# Patient Record
Sex: Male | Born: 1935 | Race: White | Hispanic: No | Smoking: Former smoker
Health system: Southern US, Community
[De-identification: ages and names within clinical notes are randomized; demographics above are authoritative.]

## PROBLEM LIST (undated history)

## (undated) DIAGNOSIS — G4733 Obstructive sleep apnea (adult) (pediatric): Secondary | ICD-10-CM

## (undated) DIAGNOSIS — C439 Malignant melanoma of skin, unspecified: Secondary | ICD-10-CM

## (undated) DIAGNOSIS — Z86711 Personal history of pulmonary embolism: Secondary | ICD-10-CM

## (undated) DIAGNOSIS — G629 Polyneuropathy, unspecified: Secondary | ICD-10-CM

## (undated) DIAGNOSIS — I214 Non-ST elevation (NSTEMI) myocardial infarction: Secondary | ICD-10-CM

## (undated) DIAGNOSIS — K219 Gastro-esophageal reflux disease without esophagitis: Secondary | ICD-10-CM

## (undated) DIAGNOSIS — J449 Chronic obstructive pulmonary disease, unspecified: Secondary | ICD-10-CM

## (undated) DIAGNOSIS — C4492 Squamous cell carcinoma of skin, unspecified: Secondary | ICD-10-CM

## (undated) DIAGNOSIS — H9193 Unspecified hearing loss, bilateral: Secondary | ICD-10-CM

## (undated) DIAGNOSIS — E785 Hyperlipidemia, unspecified: Secondary | ICD-10-CM

## (undated) DIAGNOSIS — I82409 Acute embolism and thrombosis of unspecified deep veins of unspecified lower extremity: Secondary | ICD-10-CM

## (undated) DIAGNOSIS — I10 Essential (primary) hypertension: Secondary | ICD-10-CM

## (undated) HISTORY — PX: PERCUTANEOUS CORONARY STENT INTERVENTION (PCI-S): SHX6016

---

## 2021-10-31 ENCOUNTER — Observation Stay (HOSPITAL_COMMUNITY): Payer: Medicare Other

## 2021-10-31 ENCOUNTER — Encounter (HOSPITAL_COMMUNITY): Payer: Self-pay | Admitting: Internal Medicine

## 2021-10-31 ENCOUNTER — Emergency Department (HOSPITAL_COMMUNITY): Payer: Medicare Other

## 2021-10-31 ENCOUNTER — Other Ambulatory Visit: Payer: Self-pay

## 2021-10-31 ENCOUNTER — Inpatient Hospital Stay (HOSPITAL_COMMUNITY)
Admission: EM | Admit: 2021-10-31 | Discharge: 2021-11-08 | DRG: 643 | Disposition: A | Discharge status: Skilled Nursing Facility | Payer: Medicare Other | Source: Skilled Nursing Facility | Attending: Internal Medicine | Admitting: Internal Medicine

## 2021-10-31 DIAGNOSIS — R140 Abdominal distension (gaseous): Secondary | ICD-10-CM

## 2021-10-31 DIAGNOSIS — R1314 Dysphagia, pharyngoesophageal phase: Secondary | ICD-10-CM | POA: Diagnosis present

## 2021-10-31 DIAGNOSIS — R0602 Shortness of breath: Secondary | ICD-10-CM

## 2021-10-31 DIAGNOSIS — Z79899 Other long term (current) drug therapy: Secondary | ICD-10-CM

## 2021-10-31 DIAGNOSIS — I10 Essential (primary) hypertension: Secondary | ICD-10-CM | POA: Diagnosis present

## 2021-10-31 DIAGNOSIS — S0083XA Contusion of other part of head, initial encounter: Secondary | ICD-10-CM

## 2021-10-31 DIAGNOSIS — E871 Hypo-osmolality and hyponatremia: Secondary | ICD-10-CM

## 2021-10-31 DIAGNOSIS — R410 Disorientation, unspecified: Secondary | ICD-10-CM

## 2021-10-31 DIAGNOSIS — E222 Syndrome of inappropriate secretion of antidiuretic hormone: Secondary | ICD-10-CM | POA: Diagnosis not present

## 2021-10-31 DIAGNOSIS — W19XXXA Unspecified fall, initial encounter: Secondary | ICD-10-CM

## 2021-10-31 DIAGNOSIS — I4891 Unspecified atrial fibrillation: Secondary | ICD-10-CM | POA: Diagnosis present

## 2021-10-31 DIAGNOSIS — I2510 Atherosclerotic heart disease of native coronary artery without angina pectoris: Secondary | ICD-10-CM | POA: Diagnosis present

## 2021-10-31 DIAGNOSIS — Z95828 Presence of other vascular implants and grafts: Secondary | ICD-10-CM

## 2021-10-31 DIAGNOSIS — G9341 Metabolic encephalopathy: Secondary | ICD-10-CM | POA: Diagnosis present

## 2021-10-31 DIAGNOSIS — E785 Hyperlipidemia, unspecified: Secondary | ICD-10-CM | POA: Diagnosis present

## 2021-10-31 DIAGNOSIS — I252 Old myocardial infarction: Secondary | ICD-10-CM

## 2021-10-31 DIAGNOSIS — G4733 Obstructive sleep apnea (adult) (pediatric): Secondary | ICD-10-CM | POA: Diagnosis present

## 2021-10-31 DIAGNOSIS — Z87891 Personal history of nicotine dependence: Secondary | ICD-10-CM

## 2021-10-31 DIAGNOSIS — J9601 Acute respiratory failure with hypoxia: Secondary | ICD-10-CM

## 2021-10-31 DIAGNOSIS — M79602 Pain in left arm: Secondary | ICD-10-CM

## 2021-10-31 DIAGNOSIS — K2100 Gastro-esophageal reflux disease with esophagitis, without bleeding: Secondary | ICD-10-CM | POA: Diagnosis present

## 2021-10-31 DIAGNOSIS — T1490XA Injury, unspecified, initial encounter: Secondary | ICD-10-CM

## 2021-10-31 DIAGNOSIS — Z66 Do not resuscitate: Secondary | ICD-10-CM | POA: Diagnosis present

## 2021-10-31 DIAGNOSIS — Z20822 Contact with and (suspected) exposure to covid-19: Secondary | ICD-10-CM | POA: Diagnosis present

## 2021-10-31 DIAGNOSIS — G9340 Encephalopathy, unspecified: Secondary | ICD-10-CM

## 2021-10-31 DIAGNOSIS — Z85820 Personal history of malignant melanoma of skin: Secondary | ICD-10-CM

## 2021-10-31 DIAGNOSIS — K449 Diaphragmatic hernia without obstruction or gangrene: Secondary | ICD-10-CM | POA: Diagnosis present

## 2021-10-31 DIAGNOSIS — H9193 Unspecified hearing loss, bilateral: Secondary | ICD-10-CM | POA: Diagnosis present

## 2021-10-31 DIAGNOSIS — Z955 Presence of coronary angioplasty implant and graft: Secondary | ICD-10-CM

## 2021-10-31 DIAGNOSIS — K8020 Calculus of gallbladder without cholecystitis without obstruction: Secondary | ICD-10-CM | POA: Diagnosis present

## 2021-10-31 DIAGNOSIS — K269 Duodenal ulcer, unspecified as acute or chronic, without hemorrhage or perforation: Secondary | ICD-10-CM | POA: Diagnosis present

## 2021-10-31 DIAGNOSIS — J441 Chronic obstructive pulmonary disease with (acute) exacerbation: Secondary | ICD-10-CM | POA: Diagnosis present

## 2021-10-31 DIAGNOSIS — E861 Hypovolemia: Secondary | ICD-10-CM | POA: Diagnosis present

## 2021-10-31 DIAGNOSIS — Z7901 Long term (current) use of anticoagulants: Secondary | ICD-10-CM

## 2021-10-31 DIAGNOSIS — Z86711 Personal history of pulmonary embolism: Secondary | ICD-10-CM

## 2021-10-31 DIAGNOSIS — K319 Disease of stomach and duodenum, unspecified: Secondary | ICD-10-CM | POA: Diagnosis present

## 2021-10-31 DIAGNOSIS — Z86718 Personal history of other venous thrombosis and embolism: Secondary | ICD-10-CM

## 2021-10-31 HISTORY — DX: Personal history of pulmonary embolism: Z86.711

## 2021-10-31 HISTORY — DX: Acute embolism and thrombosis of unspecified deep veins of unspecified lower extremity: I82.409

## 2021-10-31 HISTORY — DX: Unspecified hearing loss, bilateral: H91.93

## 2021-10-31 HISTORY — DX: Chronic obstructive pulmonary disease, unspecified: J44.9

## 2021-10-31 HISTORY — DX: Malignant melanoma of skin, unspecified: C43.9

## 2021-10-31 HISTORY — DX: Polyneuropathy, unspecified: G62.9

## 2021-10-31 HISTORY — DX: Essential (primary) hypertension: I10

## 2021-10-31 HISTORY — DX: Non-ST elevation (NSTEMI) myocardial infarction: I21.4

## 2021-10-31 HISTORY — DX: Squamous cell carcinoma of skin, unspecified: C44.92

## 2021-10-31 HISTORY — DX: Gastro-esophageal reflux disease without esophagitis: K21.9

## 2021-10-31 HISTORY — DX: Hyperlipidemia, unspecified: E78.5

## 2021-10-31 HISTORY — DX: Obstructive sleep apnea (adult) (pediatric): G47.33

## 2021-10-31 LAB — URINALYSIS, ROUTINE W REFLEX MICROSCOPIC
Bilirubin Urine: NEGATIVE
Glucose, UA: NEGATIVE mg/dL
Hgb urine dipstick: NEGATIVE
Ketones, ur: 20 mg/dL — AB
Leukocytes,Ua: NEGATIVE
Nitrite: NEGATIVE
Protein, ur: NEGATIVE mg/dL
Specific Gravity, Urine: 1.013 (ref 1.005–1.030)
pH: 7 (ref 5.0–8.0)

## 2021-10-31 LAB — RESP PANEL BY RT-PCR (FLU A&B, COVID) ARPGX2
Influenza A by PCR: NEGATIVE
Influenza B by PCR: NEGATIVE
SARS Coronavirus 2 by RT PCR: NEGATIVE

## 2021-10-31 LAB — I-STAT CHEM 8, ED
BUN: 15 mg/dL (ref 8–23)
Calcium, Ion: 0.92 mmol/L — ABNORMAL LOW (ref 1.15–1.40)
Chloride: 98 mmol/L (ref 98–111)
Creatinine, Ser: 0.8 mg/dL (ref 0.61–1.24)
Glucose, Bld: 80 mg/dL (ref 70–99)
HCT: 38 % — ABNORMAL LOW (ref 39.0–52.0)
Hemoglobin: 12.9 g/dL — ABNORMAL LOW (ref 13.0–17.0)
Potassium: 3.6 mmol/L (ref 3.5–5.1)
Sodium: 126 mmol/L — ABNORMAL LOW (ref 135–145)
TCO2: 17 mmol/L — ABNORMAL LOW (ref 22–32)

## 2021-10-31 LAB — COMPREHENSIVE METABOLIC PANEL
ALT: 24 U/L (ref 0–44)
AST: 33 U/L (ref 15–41)
Albumin: 3.3 g/dL — ABNORMAL LOW (ref 3.5–5.0)
Alkaline Phosphatase: 60 U/L (ref 38–126)
Anion gap: 9 (ref 5–15)
BUN: 16 mg/dL (ref 8–23)
CO2: 21 mmol/L — ABNORMAL LOW (ref 22–32)
Calcium: 8.4 mg/dL — ABNORMAL LOW (ref 8.9–10.3)
Chloride: 92 mmol/L — ABNORMAL LOW (ref 98–111)
Creatinine, Ser: 1.1 mg/dL (ref 0.61–1.24)
GFR, Estimated: >60 mL/min (ref >60)
Glucose, Bld: 91 mg/dL (ref 70–99)
Potassium: 4.2 mmol/L (ref 3.5–5.1)
Sodium: 122 mmol/L — ABNORMAL LOW (ref 135–145)
Total Bilirubin: 1.7 mg/dL — ABNORMAL HIGH (ref 0.3–1.2)
Total Protein: 6.3 g/dL — ABNORMAL LOW (ref 6.5–8.1)

## 2021-10-31 LAB — CBC
HCT: 43.8 % (ref 39.0–52.0)
Hemoglobin: 15 g/dL (ref 13.0–17.0)
MCH: 31.4 pg (ref 26.0–34.0)
MCHC: 34.2 g/dL (ref 30.0–36.0)
MCV: 91.6 fL (ref 80.0–100.0)
Platelets: 191 10*3/uL (ref 150–400)
RBC: 4.78 MIL/uL (ref 4.22–5.81)
RDW: 12.1 % (ref 11.5–15.5)
WBC: 9.2 10*3/uL (ref 4.0–10.5)
nRBC: 0 % (ref 0.0–0.2)

## 2021-10-31 LAB — BASIC METABOLIC PANEL
Anion gap: 9 (ref 5–15)
BUN: 15 mg/dL (ref 8–23)
CO2: 19 mmol/L — ABNORMAL LOW (ref 22–32)
Calcium: 8.3 mg/dL — ABNORMAL LOW (ref 8.9–10.3)
Chloride: 95 mmol/L — ABNORMAL LOW (ref 98–111)
Creatinine, Ser: 1.03 mg/dL (ref 0.61–1.24)
GFR, Estimated: >60 mL/min (ref >60)
Glucose, Bld: 95 mg/dL (ref 70–99)
Potassium: 4.1 mmol/L (ref 3.5–5.1)
Sodium: 123 mmol/L — ABNORMAL LOW (ref 135–145)

## 2021-10-31 LAB — BLOOD GAS, ARTERIAL
Acid-base deficit: 4.6 mmol/L — ABNORMAL HIGH (ref 0.0–2.0)
Bicarbonate: 19.3 mmol/L — ABNORMAL LOW (ref 20.0–28.0)
Drawn by: 33176
FIO2: 21
O2 Saturation: 95.5 %
Patient temperature: 37
pCO2 arterial: 31.3 mmHg — ABNORMAL LOW (ref 32.0–48.0)
pH, Arterial: 7.407 (ref 7.350–7.450)
pO2, Arterial: 77 mmHg — ABNORMAL LOW (ref 83.0–108.0)

## 2021-10-31 LAB — PROTIME-INR
INR: 1.2 (ref 0.8–1.2)
Prothrombin Time: 15.4 seconds — ABNORMAL HIGH (ref 11.4–15.2)

## 2021-10-31 LAB — SAMPLE TO BLOOD BANK

## 2021-10-31 LAB — LACTIC ACID, PLASMA: Lactic Acid, Venous: 1 mmol/L (ref 0.5–1.9)

## 2021-10-31 LAB — OSMOLALITY: Osmolality: 259 mOsm/kg — ABNORMAL LOW (ref 275–295)

## 2021-10-31 LAB — BRAIN NATRIURETIC PEPTIDE: B Natriuretic Peptide: 129.2 pg/mL — ABNORMAL HIGH (ref 0.0–100.0)

## 2021-10-31 LAB — ETHANOL: Alcohol, Ethyl (B): <10 mg/dL (ref <10)

## 2021-10-31 LAB — TSH: TSH: 1.687 u[IU]/mL (ref 0.350–4.500)

## 2021-10-31 MED ORDER — APIXABAN 5 MG PO TABS
5.0000 mg | ORAL_TABLET | Freq: Two times a day (BID) | ORAL | Status: DC
Start: 2021-10-31 — End: 2021-11-06
  Administered 2021-10-31 – 2021-11-06 (×12): 5 mg via ORAL
  Filled 2021-10-31 (×12): qty 1

## 2021-10-31 MED ORDER — ACETAMINOPHEN 650 MG RE SUPP: 650.0000 mg | Freq: Four times a day (QID) | RECTAL | Status: DC | PRN

## 2021-10-31 MED ORDER — LISINOPRIL 20 MG PO TABS
20.0000 mg | ORAL_TABLET | Freq: Every day | ORAL | Status: DC
  Administered 2021-10-31 – 2021-11-01 (×2): 20 mg via ORAL
  Filled 2021-10-31 (×2): qty 1

## 2021-10-31 MED ORDER — AMLODIPINE BESYLATE 5 MG PO TABS: 5.0000 mg | ORAL_TABLET | Freq: Every day | ORAL | Status: DC

## 2021-10-31 MED ORDER — IPRATROPIUM-ALBUTEROL 0.5-2.5 (3) MG/3ML IN SOLN
3.0000 mL | RESPIRATORY_TRACT | Status: DC | PRN
  Administered 2021-11-01: 3 mL via RESPIRATORY_TRACT
  Filled 2021-10-31: qty 3

## 2021-10-31 MED ORDER — ACETAMINOPHEN 325 MG PO TABS
650.0000 mg | ORAL_TABLET | Freq: Four times a day (QID) | ORAL | Status: DC | PRN
  Administered 2021-11-04: 02:00:00 650 mg via ORAL
  Filled 2021-10-31 (×2): qty 2

## 2021-10-31 MED ORDER — ATORVASTATIN CALCIUM 10 MG PO TABS
20.0000 mg | ORAL_TABLET | Freq: Every day | ORAL | Status: DC
  Administered 2021-11-01 – 2021-11-08 (×8): 20 mg via ORAL
  Filled 2021-10-31 (×8): qty 2

## 2021-10-31 MED ORDER — UMECLIDINIUM BROMIDE 62.5 MCG/ACT IN AEPB: 1.0000 | INHALATION_SPRAY | Freq: Every day | RESPIRATORY_TRACT | Status: DC

## 2021-10-31 MED ORDER — LABETALOL HCL 5 MG/ML IV SOLN
10.0000 mg | INTRAVENOUS | Status: DC | PRN
  Administered 2021-10-31 – 2021-11-02 (×3): 10 mg via INTRAVENOUS
  Filled 2021-10-31 (×3): qty 4

## 2021-10-31 MED ORDER — UMECLIDINIUM BROMIDE 62.5 MCG/ACT IN AEPB
1.0000 | INHALATION_SPRAY | Freq: Every day | RESPIRATORY_TRACT | Status: DC
  Administered 2021-11-01 – 2021-11-08 (×7): 1 via RESPIRATORY_TRACT
  Filled 2021-10-31: qty 7

## 2021-10-31 MED ORDER — SODIUM CHLORIDE 0.9 % IV BOLUS
1000.0000 mL | Freq: Once | INTRAVENOUS | Status: AC
  Administered 2021-10-31: 15:00:00 1000 mL via INTRAVENOUS

## 2021-10-31 MED ORDER — LISINOPRIL 20 MG PO TABS: 20.0000 mg | ORAL_TABLET | Freq: Every day | ORAL | Status: DC

## 2021-10-31 NOTE — ED Provider Notes (Signed)
[facility] EMERGENCY DEPARTMENT Provider Note   CSN: [id number] Arrival date & time:        History Chief Complaint  Patient presents with   Marcus Martin is a 87 y.o. male.  HPI Patient presents as a level 2 trauma, via EMS.  History is obtained by those individuals.  Patient does not recall falling, and has no current complaints, but is confused, offers an incoherent histor  himself.  Level 5 caveat secondary to confusion.     Per EMS the patient was found today confused with a hematoma in his right forehead.  He does not recall falling, nor is the report that it was witnessed.  He does take blood thinning medication for history of A. fib.  Today patient also had 1 episode of vomiting.     Beyond anticoagulation, A. fib, history not available, secondary to confusion, level 5 caveat.    Home Medications Prior to Admission medications   Not on File    Allergies    Patient has no allergy information on record.  Review of Systems   Review of Systems  Unable to perform ROS: Mental status change   Physical Exam Updated Vital Signs BP (!) 162/62   Pulse 78   Temp 98 F (36.7 C) (Temporal)   Resp 18   SpO2 96%   Physical Exam Vitals and nursing note reviewed.  Constitutional:      General: He is not in acute distress.    Appearance: He is well-developed.  HENT:     Head: Normocephalic.   Eyes:     Conjunctiva/sclera: Conjunctivae normal.  Cardiovascular:     Rate and Rhythm: Normal rate and regular rhythm.  Pulmonary:     Effort: Pulmonary effort is normal. No respiratory distress.     Breath sounds: No stridor.  Abdominal:     General: There is no distension.    Musculoskeletal:     Cervical back: Neck supple. No rigidity.  Skin:    General: Skin is warm and dry.  Neurological:     Mental Status: He is alert.     Motor: Atrophy present. No tremor or abnormal muscle tone.  Psychiatric:        Cognition and Memory:  Cognition is impaired. Memory is impaired.    ED Results / Procedures / Treatments   Labs (all labs ordered are listed, but only abnormal results are displayed) Labs Reviewed  COMPREHENSIVE METABOLIC PANEL - Abnormal; Notable for the following components:      Result Value   Sodium 122 (*)    Chloride 92 (*)    CO2 21 (*)    Calcium 8.4 (*)    Total Protein 6.3 (*)    Albumin 3.3 (*)    Total Bilirubin 1.7 (*)    All other components within normal limits  PROTIME-INR - Abnormal; Notable for the following components:   Prothrombin Time 15.4 (*)    All other components within normal limits  I-STAT CHEM 8, ED - Abnormal; Notable for the following components:   Sodium 126 (*)    Calcium, Ion 0.92 (*)    TCO2 17 (*)    Hemoglobin 12.9 (*)    HCT 38.0 (*)    All other components within normal limits  RESP PANEL BY RT-PCR (FLU A&B, COVID) ARPGX2  CBC  ETHANOL  LACTIC ACID, PLASMA  URINALYSIS, ROUTINE W REFLEX MICROSCOPIC  SAMPLE TO BLOOD BANK    EKG EKG  Interpretation  Date/Time:  Monday October 31 2021 12:35:43 EST Ventricular Rate:  76 PR Interval:  238 QRS Duration: 90 QT Interval:  390 QTC Calculation: 439 R Axis:   72 Text Interpretation: Sinus rhythm Prolonged PR interval Artifact Abnormal ECG Confirmed by Carmin Muskrat [phone]) on 10/31/2021 1:05:48 PM  Radiology DG Chest 1 View  Result Date: 10/31/2021 CLINICAL DATA:  Possible unwitnessed fall, forehead hematoma, trauma EXAM: CHEST  1 VIEW COMPARISON:  None. FINDINGS: Mild cardiomegaly and chronic vascular congestion. Negative for pneumonia, edema, effusion, or pneumothorax. Old posterior rib fractures bilaterally. Degenerative changes of the spine. Bones are osteopenic. Aorta atherosclerotic. IMPRESSION: Cardiomegaly and vascular congestion. No acute finding by plain radiography Aortic atherosclerosis Electronically Signed   By: Jerilynn Mages.  Shick M.D.   On: 10/31/2021 13:13   DG Pelvis 1-2 Views  Result Date:  10/31/2021 CLINICAL DATA:  Unwitnessed fall, trauma, possible injury EXAM: PELVIS - 1-2 VIEW COMPARISON:  None. FINDINGS: Bony pelvis and hips appear symmetric and intact. No acute osseous finding or displaced fracture. Degenerative changes of the lumbosacral spine, SI joints and both hips. Aortoiliac and femoral atherosclerosis present. Nonobstructive bowel gas pattern. IMPRESSION: No acute finding by plain radiography. Electronically Signed   By: Jerilynn Mages.  Shick M.D.   On: 10/31/2021 13:15   CT HEAD WO CONTRAST  Result Date: 10/31/2021 CLINICAL DATA:  Unwitnessed fall, altered mental status EXAM: CT HEAD WITHOUT CONTRAST TECHNIQUE: Contiguous axial images were obtained from the base of the skull through the vertex without intravenous contrast. COMPARISON:  None. FINDINGS: Brain: Brain atrophy and advanced white matter microvascular changes throughout both cerebral hemispheres. No acute intracranial hemorrhage, mass lesion, new infarction, midline shift, herniation hydrocephalus, or extra-axial fluid collection. No focal mass effect or edema. Cisterns are patent. No cerebellar abnormality. Vascular: Intracranial atherosclerosis.  No hyperdense vessel. Skull: Remote suboccipital craniectomy. No other acute osseous finding. Mastoids are clear. Sinuses/Orbits: No acute finding. Other: None. IMPRESSION: Atrophy and advanced white matter microvascular ischemic changes. No acute intracranial abnormality by noncontrast CT. Remote suboccipital craniectomy. Electronically Signed   By: Jerilynn Mages.  Shick M.D.   On: 10/31/2021 13:24   CT CERVICAL SPINE WO CONTRAST  Result Date: 10/31/2021 CLINICAL DATA:  Unwitnessed fall, altered mental status EXAM: CT CERVICAL SPINE WITHOUT CONTRAST TECHNIQUE: Multidetector CT imaging of the cervical spine was performed without intravenous contrast. Multiplanar CT image reconstructions were also generated. COMPARISON:  None. FINDINGS: Alignment: No subluxation dislocation. Skull base and  vertebrae: No acute osseous finding or fracture. Bones are osteopenic. C1 arch is incomplete both anterior and posteriorly. Soft tissues and spinal canal: No prevertebral fluid or swelling. No visible canal hematoma. Disc levels: Degenerative changes throughout the cervical spine. Previous anterior cervical discectomy and fusion at C5-6. Solid fusion at the surgical level. Degenerative disc disease most pronounced at C4-5 and C6-7 with marked disc space narrowing, sclerosis and osteophytes. Multilevel posterior facet arthropathy, worse on the right. Facets are aligned. No subluxation or dislocation appreciated. Upper chest: Negative. Other: None. IMPRESSION: No acute cervical spine fracture or malalignment by CT. Osteopenia, degenerative changes and previous ACDF at C5-6. Electronically Signed   By: Jerilynn Mages.  Shick M.D.   On: 10/31/2021 13:21    Procedures Procedures   Medications Ordered in ED Medications  sodium chloride 0.9 % bolus 1,000 mL (1,000 mLs Intravenous New Bag/Given 10/31/21 1443)    ED Course  I have reviewed the triage vital signs and the nursing notes.  Pertinent labs & imaging results that were available during my care  of the patient were reviewed by me and considered in my medical decision making (see chart for details).   1:31 PM Imaging reviewed, no notable findings.  Cardiac 70 sinus rhythm normal Pulse ox 100% room air normal 3:36 PM Imaging, labs all reviewed.  Imaging generally reassuring.  However, the patient is found to have substantial abnormality of his sodium value, which may be contributing to his nausea, vomiting, weakness, being down.  With consideration of hyponatremia, confusion, will require admission for ongoing fluid resuscitation.  I discussed this case with our internal medicine colleagues for admission. MDM Rules/Calculators/A&P MDM Number of Diagnoses or Management Options Confusion: new, needed workup Contusion of face, initial encounter: new, needed  workup Fall, initial encounter: new, needed workup Hyponatremia: new, needed workup Trauma: new, needed workup   Amount and/or Complexity of Data Reviewed Clinical lab tests: reviewed and ordered Tests in the radiology section of CPT: ordered and reviewed Tests in the medicine section of CPT: ordered and reviewed Decide to obtain previous medical records or to obtain history from someone other than the patient: yes Obtain history from someone other than the patient: yes Discuss the patient with other providers: yes Independent visualization of images, tracings, or specimens: yes  Risk of Complications, Morbidity, and/or Mortality Presenting problems: high Diagnostic procedures: high Management options: high  Critical Care Total time providing critical care: < 30 minutes  Patient Progress Patient progress: stable   Final Clinical Impression(s) / ED Diagnoses Final diagnoses:  Trauma  Fall, initial encounter  Contusion of face, initial encounter  Confusion  Hyponatremia     Carmin Muskrat, MD 10/31/21 1538

## 2021-10-31 NOTE — H&P (Addendum)
Date: 10/31/2021               Patient Name:  Marcus Martin MRN: [id number]  DOB: November 01, 1933 Age / Sex: 87 y.o., male   PCP: Corrington, Delsa Grana, MD              Medical Service: Internal Medicine Teaching Service              Attending Physician: Dr. Velna Ochs, MD    First Contact: Marcus Kinds, MS IV Pager: [phone]  Second Contact: Dr. Gaylan Martin Pager: [phone]            After Hours (After 5p/  First Contact Pager: [phone]  weekends / holidays): Second Contact Pager: [phone]   Chief Complaint:   History of Present Illness: Marcus Martin is an 87 year old male with PMH significant for SVT, h/o NSTEMI s/p PCI, Afib, COPD, HTN, HLD, h/o PE with IVC filter placement and DVT, h/o squamous cell carcinoma of the skin, GERD, peripheral neuropathy, melanoma, and bilateral hearing loss who presents to the ED from [facility] for confusion and weakness. Patient is a poor historian and history is obtained from the patient, his son and the Special educational needs teacher of the independent living community in [place]. Son reports that the patient lives in the independent community section of the facility by himself and at baseline is able to walk with a walker and drive on his own. Per the Special educational needs teacher, patient's neighbor saw him last night and noted that he was himself. However, neighbor noticed that he did not do any of his morning routine which included getting exercise and coffee. Special educational needs teacher also noted that he arrived at [facility] around 10 am for lunch which is abnormal for him then had an episode of nausea and vomiting (of small quantity). He then became confused, had generalized weakness and trouble ambulating. The nurse and dietary worker took him to his room where they checked his blood pressure which was in the 170s-180s and noticed a bruise on his R-forehead. EMS was called. Patient reports that he remembers sitting down on the couch then  remembers seeing EMS personnel next. He does not remember if he sustained a fall and no one at the facility witnessed any falls.  He endorses progressively worsening SOB for the last few days. Pt endorses loss of appetite but has had no changes in his eating habits or fluid intake. He denies any palpitations, leg swelling, nausea, vomiting, HA, weight loss, night sweats, fever, chills, dysuria, or hematuria.   Son does report that pt had a fall about 3 months ago where he injured his left arm and shoulder. He also notes that he has had multiple episodes of lightheadedness while standing up. Son notes at baseline, the patient is able to carry out conversations, is A & O x3, but recently has been complaining of memory loss and inability to remember names. Son notes that pt was able to play trivia board games on 11/25 and did not have any bruises on his head.   ED course is as follows: Patient had Na of 126 (with multiple history of Na of 120s), alcohol level <10, an unremarkable CT of the head and spine, and an EKG NSR 76 bpm with prolonged PR interval.   PCP- Marcus Martin [phone] Community director- Marcus Martin  [phone]   Meds: Current Facility-Administered Medications  Medication Dose Route Frequency Provider Last Rate Last Admin   acetaminophen (TYLENOL) tablet 650 mg  650 mg Oral Q6H PRN Marcus Gerold, DO       Or   acetaminophen (TYLENOL) suppository 650 mg  650 mg Rectal Q6H PRN Marcus Gerold, DO       [START ON 11/01/2021] atorvastatin (LIPITOR) tablet 20 mg  20 mg Oral Daily Marcus Gerold, DO       ipratropium-albuterol (DUONEB) 0.5-2.5 (3) MG/3ML nebulizer solution 3 mL  3 mL Nebulization Q4H PRN Marcus Gerold, DO       labetalol (NORMODYNE) injection 10 mg  10 mg Intravenous Q2H PRN Marcus Gerold, DO   10 mg at 10/31/21 1706   lisinopril (ZESTRIL) tablet 20 mg  20 mg Oral Daily Marcus Gerold, DO        Allergies: Allergies as of 10/31/2021   (Not on File)   Past Medical  History:  Diagnosis Date   Atrial fibrillation (HCC)    Bilateral hearing loss    COPD (chronic obstructive pulmonary disease) (HCC)    DVT (deep venous thrombosis) (HCC)    GERD (gastroesophageal reflux disease)    History of pulmonary embolus (PE)    HLD (hyperlipidemia)    HTN (hypertension)    Melanoma (HCC)    NSTEMI (non-ST elevated myocardial infarction) (HCC)    OSA (obstructive sleep apnea)    Peripheral neuropathy    Squamous cell skin cancer    Past Surgical History:  Procedure Laterality Date   PERCUTANEOUS CORONARY STENT INTERVENTION (PCI-S)     RCA   Family History  Problem Relation Age of Onset   Cancer Mother    Social History   Socioeconomic History   Marital status: Widowed    Spouse name: Not on file   Number of children: Not on file   Years of education: Not on file   Highest education level: Not on file  Occupational History   Not on file  Tobacco Use   Smoking status: Former    Types: Cigarettes    Quit date: 1980    Years since quitting: 42.9   Smokeless tobacco: Not on file  Substance and Sexual Activity   Alcohol use: Not Currently   Drug use: Never   Sexual activity: Not on file    Comment: -  Other Topics Concern   Not on file  Social History Narrative   Not on file   Social Determinants of Health   Financial Resource Strain: Not on file  Food Insecurity: Not on file  Transportation Needs: Not on file  Physical Activity: Not on file  Stress: Not on file  Social Connections: Not on file  Intimate Partner Violence: Not on file    Review of Systems: Negative ROS except as stated in the HPI.   Physical Exam: Blood pressure 110/74, pulse 73, temperature 98 F (36.7 C), temperature source Temporal, resp. rate 20, height 5\' 10"  (1.778 m), weight 113.4 kg, SpO2 95 %. BP 110/74 (BP Location: Left Leg)   Pulse 73   Temp 98 F (36.7 C) (Temporal)   Resp 20   Ht 5\' 10"  (1.778 m)   Wt 113.4 kg   SpO2 95%   BMI 35.87 kg/m    General Appearance:    Alert, cooperative, no distress, appears stated age  Head:    Normocephalic, contusion to the right forehead  Eyes:    PERRL, conjunctiva/corneas clear, EOM's intact, fundi    benign, both eyes       Ears:    Normal TM's and external ear canals, both ears. Lesions  present bilaterally to external ear.  Nose:   Nares normal, septum midline, mucosa normal, no drainage  or sinus tenderness  Throat:   Lips, mucosa, and tongue normal; teeth and gums normal  Neck:   Supple, symmetrical, trachea midline  Back:     Symmetric, no curvature, ROM normal, no CVA tenderness  Lungs:     Clear to auscultation bilaterally, respirations unlabored  Chest wall:    No tenderness or deformity  Heart:    Regular rate and rhythm, S1 and S2 normal, no murmur, rub or gallop  Abdomen:     Soft, non-tender, bowel sounds active all four quadrants,    no masses, no organomegaly. Distended.        Extremities:   Extremities normal, atraumatic, no cyanosis or edema  Pulses:   2+ and symmetric all extremities  Skin:   Skin color, texture, turgor normal, no rashes. Lesions and ecchymosis present bilaterally on arms.   Lymph nodes:   Cervical, supraclavicular, and axillary nodes normal  Neurologic:   Alert and Oriented to place and person only. CNII-XII intact. Normal strength, sensation and reflexes      Throughout. Limited ROM of bilateral arms due to pain.    Lab results: CBC Latest Ref Rng & Units 10/31/2021 10/31/2021  WBC 4.0 - 10.5 K/uL - 9.2  Hemoglobin 13.0 - 17.0 g/dL 12.9(L) 15.0  Hematocrit 39.0 - 52.0 % 38.0(L) 43.8  Platelets 150 - 400 K/uL - 191    CMP Latest Ref Rng & Units 10/31/2021 10/31/2021  Glucose 70 - 99 mg/dL 80 91  BUN 8 - 23 mg/dL 15 16  Creatinine 0.61 - 1.24 mg/dL 0.80 1.10  Sodium 135 - 145 mmol/L 126(L) 122(L)  Potassium 3.5 - 5.1 mmol/L 3.6 4.2  Chloride 98 - 111 mmol/L 98 92(L)  CO2 22 - 32 mmol/L - 21(L)  Calcium 8.9 - 10.3 mg/dL - 8.4(L)  Total  Protein 6.5 - 8.1 g/dL - 6.3(L)  Total Bilirubin 0.3 - 1.2 mg/dL - 1.7(H)  Alkaline Phos 38 - 126 U/L - 60  AST 15 - 41 U/L - 33  ALT 0 - 44 U/L - 24     Imaging results:  DG Chest 1 View  Result Date: 10/31/2021 CLINICAL DATA:  Possible unwitnessed fall, forehead hematoma, trauma EXAM: CHEST  1 VIEW COMPARISON:  None. FINDINGS: Mild cardiomegaly and chronic vascular congestion. Negative for pneumonia, edema, effusion, or pneumothorax. Old posterior rib fractures bilaterally. Degenerative changes of the spine. Bones are osteopenic. Aorta atherosclerotic. IMPRESSION: Cardiomegaly and vascular congestion. No acute finding by plain radiography Aortic atherosclerosis Electronically Signed   By: Jerilynn Mages.  Shick M.D.   On: 10/31/2021 13:13   DG Pelvis 1-2 Views  Result Date: 10/31/2021 CLINICAL DATA:  Unwitnessed fall, trauma, possible injury EXAM: PELVIS - 1-2 VIEW COMPARISON:  None. FINDINGS: Bony pelvis and hips appear symmetric and intact. No acute osseous finding or displaced fracture. Degenerative changes of the lumbosacral spine, SI joints and both hips. Aortoiliac and femoral atherosclerosis present. Nonobstructive bowel gas pattern. IMPRESSION: No acute finding by plain radiography. Electronically Signed   By: Jerilynn Mages.  Shick M.D.   On: 10/31/2021 13:15   DG ELBOW COMPLETE LEFT (3+VIEW)  Result Date: 10/31/2021 CLINICAL DATA:  Arm pain EXAM: LEFT ELBOW - COMPLETE 3+ VIEW COMPARISON:  None. FINDINGS: There is no evidence of fracture, dislocation, or joint effusion. There is no evidence of arthropathy or other focal bone abnormality. Soft tissues are unremarkable. IMPRESSION: Negative. Electronically Signed  By: Donavan Foil M.D.   On: 10/31/2021 17:45   CT HEAD WO CONTRAST  Result Date: 10/31/2021 CLINICAL DATA:  Unwitnessed fall, altered mental status EXAM: CT HEAD WITHOUT CONTRAST TECHNIQUE: Contiguous axial images were obtained from the base of the skull through the vertex without intravenous  contrast. COMPARISON:  None. FINDINGS: Brain: Brain atrophy and advanced white matter microvascular changes throughout both cerebral hemispheres. No acute intracranial hemorrhage, mass lesion, new infarction, midline shift, herniation hydrocephalus, or extra-axial fluid collection. No focal mass effect or edema. Cisterns are patent. No cerebellar abnormality. Vascular: Intracranial atherosclerosis.  No hyperdense vessel. Skull: Remote suboccipital craniectomy. No other acute osseous finding. Mastoids are clear. Sinuses/Orbits: No acute finding. Other: None. IMPRESSION: Atrophy and advanced white matter microvascular ischemic changes. No acute intracranial abnormality by noncontrast CT. Remote suboccipital craniectomy. Electronically Signed   By: Jerilynn Mages.  Shick M.D.   On: 10/31/2021 13:24   CT CERVICAL SPINE WO CONTRAST  Result Date: 10/31/2021 CLINICAL DATA:  Unwitnessed fall, altered mental status EXAM: CT CERVICAL SPINE WITHOUT CONTRAST TECHNIQUE: Multidetector CT imaging of the cervical spine was performed without intravenous contrast. Multiplanar CT image reconstructions were also generated. COMPARISON:  None. FINDINGS: Alignment: No subluxation dislocation. Skull base and vertebrae: No acute osseous finding or fracture. Bones are osteopenic. C1 arch is incomplete both anterior and posteriorly. Soft tissues and spinal canal: No prevertebral fluid or swelling. No visible canal hematoma. Disc levels: Degenerative changes throughout the cervical spine. Previous anterior cervical discectomy and fusion at C5-6. Solid fusion at the surgical level. Degenerative disc disease most pronounced at C4-5 and C6-7 with marked disc space narrowing, sclerosis and osteophytes. Multilevel posterior facet arthropathy, worse on the right. Facets are aligned. No subluxation or dislocation appreciated. Upper chest: Negative. Other: None. IMPRESSION: No acute cervical spine fracture or malalignment by CT. Osteopenia, degenerative  changes and previous ACDF at C5-6. Electronically Signed   By: Jerilynn Mages.  Shick M.D.   On: 10/31/2021 13:21    Other results: EKG: NSR 76 bpm with prolonged PR interval  Assessment & Plan by Problem: Principal Problem:   Acute encephalopathy  Patient is a 87 year old male with PMH significant for SVT, h/o NSTEMI s/p PCI, COPD, HTN, h/o PE with IVC filter placement and DVT, h/o squamous cell carcinoma of the skin, GERD, peripheral neuropathy, melanoma, and bilateral hearing loss admitted for acute encephalopathy.  Acute encephalopathy Hyponatremia Patient had a sudden change in mentation, generalized weakness, and change in gait this morning as witnessed by the neighbor and staff at the living facility. Patient does not remember a fall nor did anyone at the facility witness a fall. At the ED, patient had a Na of 126 (with multiple history of Na in 120s), alcohol level <10, an unremarkable CT of the head and spine, and an EKG NSR 76 bpm with prolonged PR interval. -Obtain MRI of the head to rule out any developing small hemorrhages, hematoma or embolic strokes in the brain as well as possible Posterior Reversible Encephalopathy Syndrome. -Obtain UA to r/o UTI and related changes in mentation.  -Given that the patient has abdominal distention and changes in mentation, obtain a RUQ [place] to r/o any sinusoidal hepatic disorders leading to encephalopathy.  -Patient complained of a history of lightheadedness with standing. Obtain orthostatic vitals to rule orthostatic syncope and related fall.  -Hyponatremia work up: measure urine sodium levels, urine osmolality, and blood osmolality.  COPD OSA Patient has a history of COPD without any O2 requirements at home. -Obtain  ABGs to rule out any respiratory acidosis related changes in mentation.  -continue duonebs  HTN Patient has had SBP in th180s since this morning -Labetalol PRN -Will resume lisinopril when MRI comes back   HLD Continue home lipitor 20 mg  QD   Basics DVT prophylaxis: none. Nutrition: cardiac diet Telemetry: on Fluids: PRN Electrolytes: Replete PRN Code: DNR   This is a Careers information officer Note.  The care of the patient was discussed with Dr. Gaylan Martin and the assessment and plan was formulated with their assistance.  Please see their note for official documentation of the patient encounter.   Signed: Gaylan Gerold, DO 10/31/2021, 6:35 PM   Attestation for Student Documentation:  I personally was present and performed or re-performed the history, physical exam and medical decision-making activities of this service and have verified that the service and findings are accurately documented in the student's note.  Marcus Martin is a 87 year old male with past medical history of COPD, CAD, HTN, DVT and PE on Eliquis who presents to the hospital for acute encephalopathy.  Story was obtained from his facility staff.  Patient was in his usual state of health until this morning when he was found to be altered during lunchtime.  Also had episode of vomiting.  His systolic blood pressure was elevated in the 180s.  There was a concern of a unwitnessed fall due to his hematoma on the right forehead; however patient confirmed that it was not new.  Patient denies any syncopal episode.  When evaluated in the ED, patient was oriented to person and place only.  According to his son, this is not normal for him.  Patient is independent with ADLs and still driving.  Physical exam is unrevealing.  He has normal neurological exam.  His heart RRR. Mild diffuse wheezing.  He has multiple large hematoma seen on bilateral forearms.  His abdomen is distended but nontender to palpation.  No lower extremity edema.  CT head was negative for any acute changes.  Given his significant elevated blood pressure and new onset acute encephalopathy, will obtain brain MRI to rule out PRES syndrome.  We will work-up his hyponatremia.  His baseline sodium is around  126-128; therefore I do not believe that hyponatremia is a cause of his encephalopathy.  We will also obtain right upper quadrant ultrasound for his abdominal distention and elevated total bilirubin. Obtain orthostatic vital signs.   Marcus Gerold, DO Internal Medicine Residency My pager: [phone]   Please contact the on call pager after 5 pm and on weekends at [phone].

## 2021-10-31 NOTE — ED Triage Notes (Signed)
Patient presents to ED via [facility] per ems staff states they check on him this am and he was confused. LSN yest. Questionable  fall, hematoma to right forehead. Vomited x 1 this am. Patient doesn't remember falling. C/o pain left arm.

## 2021-10-31 NOTE — Hospital Course (Addendum)
12/6 Call son to make appt with GI doc

## 2021-10-31 NOTE — Progress Notes (Signed)
Patient states he is not ready for his CPAP at this time because his stomach is burning.

## 2021-10-31 NOTE — Progress Notes (Signed)
   10/31/21 2339  Provider Notification  Provider Name/Title On Call Doc  Date Provider Notified 10/31/21  Time Provider Notified 2351  Notification Type Page  Notification Reason Other (Comment) (Pt throwing up coffe brown vomit)  Provider response Other (Comment) (awaiting orders)  Date of Provider Response 10/31/21  Time of Provider Response 2352

## 2021-10-31 NOTE — Progress Notes (Signed)
Orthopedic Tech Progress Note Patient Details:  Ada Woodbury 10-10-1933 [id number]  Patient ID: Wallis Bamberg, male   DOB: 1933/08/19, 87 y.o.   MRN: [id number] Level II Trauma; not needed at the moment.  Vernona Rieger 10/31/2021, 12:26 PM

## 2021-10-31 NOTE — ED Notes (Signed)
Son Marcus Martin  and update given and patient spoke with son.

## 2021-11-01 ENCOUNTER — Observation Stay (HOSPITAL_COMMUNITY): Payer: Medicare Other

## 2021-11-01 ENCOUNTER — Encounter (HOSPITAL_COMMUNITY): Payer: Self-pay | Admitting: Internal Medicine

## 2021-11-01 DIAGNOSIS — R140 Abdominal distension (gaseous): Secondary | ICD-10-CM | POA: Diagnosis not present

## 2021-11-01 DIAGNOSIS — W19XXXA Unspecified fall, initial encounter: Secondary | ICD-10-CM | POA: Diagnosis not present

## 2021-11-01 DIAGNOSIS — G9340 Encephalopathy, unspecified: Secondary | ICD-10-CM | POA: Diagnosis not present

## 2021-11-01 DIAGNOSIS — E871 Hypo-osmolality and hyponatremia: Secondary | ICD-10-CM | POA: Diagnosis not present

## 2021-11-01 LAB — HEPATIC FUNCTION PANEL
ALT: 21 U/L (ref 0–44)
AST: 38 U/L (ref 15–41)
Albumin: 3 g/dL — ABNORMAL LOW (ref 3.5–5.0)
Alkaline Phosphatase: 55 U/L (ref 38–126)
Bilirubin, Direct: 0.4 mg/dL — ABNORMAL HIGH (ref 0.0–0.2)
Indirect Bilirubin: 1.5 mg/dL — ABNORMAL HIGH (ref 0.3–0.9)
Total Bilirubin: 1.9 mg/dL — ABNORMAL HIGH (ref 0.3–1.2)
Total Protein: 5.9 g/dL — ABNORMAL LOW (ref 6.5–8.1)

## 2021-11-01 LAB — LIPASE, BLOOD: Lipase: 48 U/L (ref 11–51)

## 2021-11-01 LAB — BASIC METABOLIC PANEL
Anion gap: 9 (ref 5–15)
BUN: 17 mg/dL (ref 8–23)
CO2: 21 mmol/L — ABNORMAL LOW (ref 22–32)
Calcium: 8.1 mg/dL — ABNORMAL LOW (ref 8.9–10.3)
Chloride: 93 mmol/L — ABNORMAL LOW (ref 98–111)
Creatinine, Ser: 0.9 mg/dL (ref 0.61–1.24)
GFR, Estimated: >60 mL/min (ref >60)
Glucose, Bld: 114 mg/dL — ABNORMAL HIGH (ref 70–99)
Potassium: 3.9 mmol/L (ref 3.5–5.1)
Sodium: 123 mmol/L — ABNORMAL LOW (ref 135–145)

## 2021-11-01 LAB — CBC
HCT: 40.9 % (ref 39.0–52.0)
Hemoglobin: 15 g/dL (ref 13.0–17.0)
MCH: 32.8 pg (ref 26.0–34.0)
MCHC: 36.7 g/dL — ABNORMAL HIGH (ref 30.0–36.0)
MCV: 89.3 fL (ref 80.0–100.0)
Platelets: 179 10*3/uL (ref 150–400)
RBC: 4.58 MIL/uL (ref 4.22–5.81)
RDW: 12.1 % (ref 11.5–15.5)
WBC: 13.5 10*3/uL — ABNORMAL HIGH (ref 4.0–10.5)
nRBC: 0 % (ref 0.0–0.2)

## 2021-11-01 LAB — SODIUM, URINE, RANDOM: Sodium, Ur: 67 mmol/L

## 2021-11-01 LAB — OSMOLALITY, URINE: Osmolality, Ur: 502 mOsm/kg (ref 300–900)

## 2021-11-01 LAB — GLUCOSE, CAPILLARY: Glucose-Capillary: 105 mg/dL — ABNORMAL HIGH (ref 70–99)

## 2021-11-01 MED ORDER — SENNOSIDES-DOCUSATE SODIUM 8.6-50 MG PO TABS
1.0000 | ORAL_TABLET | Freq: Every day | ORAL | Status: DC
  Administered 2021-11-01 – 2021-11-07 (×6): 1 via ORAL
  Filled 2021-11-01 (×5): qty 1

## 2021-11-01 MED ORDER — POLYETHYLENE GLYCOL 3350 17 G PO PACK
PACK | ORAL | Status: AC
  Filled 2021-11-01: qty 1

## 2021-11-01 MED ORDER — LISINOPRIL 20 MG PO TABS
40.0000 mg | ORAL_TABLET | Freq: Every day | ORAL | Status: DC
  Administered 2021-11-02 – 2021-11-08 (×7): 40 mg via ORAL
  Filled 2021-11-01 (×7): qty 2

## 2021-11-01 MED ORDER — PREDNISONE 20 MG PO TABS
40.0000 mg | ORAL_TABLET | Freq: Every day | ORAL | Status: AC
  Administered 2021-11-02 – 2021-11-06 (×5): 40 mg via ORAL
  Filled 2021-11-01 (×5): qty 2

## 2021-11-01 MED ORDER — IPRATROPIUM-ALBUTEROL 0.5-2.5 (3) MG/3ML IN SOLN
3.0000 mL | RESPIRATORY_TRACT | Status: DC | PRN
  Filled 2021-11-01: qty 3

## 2021-11-01 MED ORDER — AZITHROMYCIN 500 MG PO TABS
500.0000 mg | ORAL_TABLET | Freq: Every day | ORAL | Status: DC
  Administered 2021-11-01 – 2021-11-02 (×2): 500 mg via ORAL
  Filled 2021-11-01 (×2): qty 1

## 2021-11-01 MED ORDER — ALUM & MAG HYDROXIDE-SIMETH 200-200-20 MG/5ML PO SUSP
ORAL | Status: AC
  Filled 2021-11-01: qty 30

## 2021-11-01 MED ORDER — IPRATROPIUM-ALBUTEROL 0.5-2.5 (3) MG/3ML IN SOLN: 3.0000 mL | Freq: Four times a day (QID) | RESPIRATORY_TRACT | Status: DC

## 2021-11-01 MED ORDER — ALUM & MAG HYDROXIDE-SIMETH 200-200-20 MG/5ML PO SUSP
30.0000 mL | Freq: Once | ORAL | Status: AC
  Administered 2021-11-01: 30 mL via ORAL

## 2021-11-01 MED ORDER — POLYETHYLENE GLYCOL 3350 17 G PO PACK
17.0000 g | PACK | Freq: Every day | ORAL | Status: DC
  Administered 2021-11-01 – 2021-11-08 (×5): 17 g via ORAL
  Filled 2021-11-01 (×5): qty 1

## 2021-11-01 MED ORDER — ONDANSETRON HCL 4 MG/2ML IJ SOLN
INTRAMUSCULAR | Status: AC
  Filled 2021-11-01: qty 2

## 2021-11-01 MED ORDER — ONDANSETRON HCL 4 MG/2ML IJ SOLN
4.0000 mg | Freq: Once | INTRAMUSCULAR | Status: AC
  Administered 2021-11-01: 4 mg via INTRAMUSCULAR

## 2021-11-01 MED ORDER — SENNOSIDES-DOCUSATE SODIUM 8.6-50 MG PO TABS
ORAL_TABLET | ORAL | Status: AC
  Filled 2021-11-01: qty 1

## 2021-11-01 NOTE — Evaluation (Signed)
Physical Therapy Evaluation Patient Details Name: Marcus Martin MRN: [id number] DOB: 04-09-1933 Today's Date: 11/01/2021  History of Present Illness  pt is an 87 y/o male,presenting 11/28 from [facility] for confusion and weakness.  PMHX: NSTEMI s/p PCI, afib, COPD, HTN, PE with IVC filter, peripheral neuropathy, bil hearing loss.  Clinical Impression  Pt admitted with/for confusion and weakness.  Still not at baseline, needing light min to min guard assist..  Pt currently limited functionally due to the problems listed below.  (see problems list.)  Pt will benefit from PT to maximize function and safety to be able to get home safely with limited available assist.        Recommendations for follow up therapy are one component of a multi-disciplinary discharge planning process, led by the attending physician.  Recommendations may be updated based on patient status, additional functional criteria and insurance authorization.  Follow Up Recommendations Home health PT    Assistance Recommended at Discharge PRN  Functional Status Assessment Patient has had a recent decline in their functional status and demonstrates the ability to make significant improvements in function in a reasonable and predictable amount of time.  Equipment Recommendations  None recommended by PT    Recommendations for Other Services       Precautions / Restrictions Precautions Precautions: Fall      Mobility  Bed Mobility               General bed mobility comments: up in the recliner on arrival    Transfers Overall transfer level: Needs assistance   Transfers: Sit to/from Stand Sit to Stand: Min assist           General transfer comment: cues for hand placement, minor assist forward and boost    Ambulation/Gait Ambulation/Gait assistance: Min guard Gait Distance (Feet): 200 Feet Assistive device: Rolling walker (2 wheels) Gait Pattern/deviations: Step-through pattern    Gait velocity interpretation: <1.8 ft/sec, indicate of risk for recurrent falls   General Gait Details: mild instability that improved with distance.  Slow cadence, pt unable to find his way back to the room without cues.  Stairs            Wheelchair Mobility    Modified Rankin (Stroke Patients Only)       Balance Overall balance assessment: Mild deficits observed, not formally tested                                           Pertinent Vitals/Pain Pain Assessment: No/denies pain    Home Living Family/patient expects to be discharged to:: Private residence Living Arrangements: Alone Available Help at Discharge: Available PRN/intermittently Type of Home: Apartment Home Access: Level entry       Home Layout: One level Home Equipment: Cane - single point Artist)      Prior Function Prior Level of Function : Independent/Modified Independent;Driving                     Hand Dominance        Extremity/Trunk Assessment        Lower Extremity Assessment Lower Extremity Assessment: Generalized weakness    Cervical / Trunk Assessment Cervical / Trunk Assessment: Kyphotic  Communication   Communication: No difficulties  Cognition Arousal/Alertness: Awake/alert Behavior During Therapy: WFL for tasks assessed/performed Overall Cognitive Status: Impaired/Different from baseline Area of Impairment: Orientation;Memory;Awareness;Problem  solving                 Orientation Level: Situation;Time;Place   Memory: Decreased short-term memory     Awareness: Intellectual Problem Solving: Slow processing;Requires verbal cues          General Comments General comments (skin integrity, edema, etc.): vss on RA    Exercises     Assessment/Plan    PT Assessment Patient needs continued PT services  PT Problem List Decreased strength;Decreased activity tolerance;Decreased mobility;Decreased knowledge of use of  DME;Decreased balance       PT Treatment Interventions Gait training;Functional mobility training;Therapeutic activities;Patient/family education    PT Goals (Current goals can be found in the Care Plan section)  Acute Rehab PT Goals Patient Stated Goal: back home PT Goal Formulation: With patient Time For Goal Achievement: 11/08/21 Potential to Achieve Goals: Good    Frequency Min 3X/week   Barriers to discharge        Co-evaluation               AM-PAC PT "6 Clicks" Mobility  Outcome Measure Help needed turning from your back to your side while in a flat bed without using bedrails?: None Help needed moving from lying on your back to sitting on the side of a flat bed without using bedrails?: None Help needed moving to and from a bed to a chair (including a wheelchair)?: A Little Help needed standing up from a chair using your arms (e.g., wheelchair or bedside chair)?: A Little Help needed to walk in hospital room?: A Little Help needed climbing 3-5 steps with a railing? : A Little 6 Click Score: 20    End of Session   Activity Tolerance: Patient tolerated treatment well Patient left: in chair;with call bell/phone within reach;with chair alarm set Nurse Communication: Mobility status PT Visit Diagnosis: Other abnormalities of gait and mobility (R26.89)    Time: 1245-1305 PT Time Calculation (min) (ACUTE ONLY): 20 min   Charges:   PT Evaluation $PT Eval Moderate Complexity: 1 Mod          11/01/2021  Ginger Carne., PT Acute Rehabilitation Services [phone]  (pager) [phone]  (office)  Tessie Fass Quanika Solem 11/01/2021, 1:29 PM

## 2021-11-01 NOTE — Evaluation (Signed)
Occupational Therapy Evaluation Patient Details Name: Marcus Martin MRN: [id number] DOB: [birth year] Today's Date: 11/01/2021   History of Present Illness pt is an 87 y/o male,presenting 11/28 from [place] for confusion and weakness.  PMHX: NSTEMI s/p PCI, afib, COPD, HTN, PE with IVC filter, peripheral neuropathy, bil hearing loss.   Clinical Impression   PT admitted with AMS. Pt currently with functional limitiations due to the deficits listed below (see OT problem list). Pt currently with cognitive and balance deficits. Pt fall risk at this time and living indep. Pt could benefit from some respite care from facility and then return to (A)ing himself.  Pt will benefit from skilled OT to increase their independence and safety with adls and balance to allow discharge [place].       Recommendations for follow up therapy are one component of a multi-disciplinary discharge planning process, led by the attending physician.  Recommendations may be updated based on patient status, additional functional criteria and insurance authorization.   Follow Up Recommendations  Home health OT    Assistance Recommended at Discharge Intermittent Supervision/Assistance  Functional Status Assessment  Patient has had a recent decline in their functional status and demonstrates the ability to make significant improvements in function in a reasonable and predictable amount of time.  Equipment Recommendations  None recommended by OT (reports has needed DME)    Recommendations for Other Services Speech consult (cognition)     Precautions / Restrictions Precautions Precautions: Fall      Mobility Bed Mobility Overal bed mobility: Needs Assistance Bed Mobility: Rolling;Supine to Sit Rolling: Min assist   Supine to sit: Min assist     General bed mobility comments: min (A) to sustain static sitting balance    Transfers Overall transfer level: Needs assistance   Transfers: Sit to/from  Stand Sit to Stand: Min assist           General transfer comment: needs (A) to static stand with RW as posterior bias      Balance Overall balance assessment: Mild deficits observed, not formally tested                                         ADL either performed or assessed with clinical judgement   ADL Overall ADL's : Needs assistance/impaired Eating/Feeding: Set up;Sitting   Grooming: Wash/dry face;Min guard;Sitting Grooming Details (indicate cue type and reason): posterior LOB needs support Upper Body Bathing: Minimal assistance   Lower Body Bathing: Maximal assistance   Upper Body Dressing : Minimal assistance   Lower Body Dressing: Maximal assistance   Toilet Transfer: Minimal assistance;Rolling walker (2 wheels)   Toileting- Clothing Manipulation and Hygiene: Maximal assistance       Functional mobility during ADLs: Minimal assistance;Rolling walker (2 wheels)       Vision Baseline Vision/History: 1 Wears glasses Additional Comments: appears to have no deficits. in fact patient reading his room board and asking OT to correct the spelling of last name hand written on the board to [place]      Pertinent Vitals/Pain Pain Assessment: No/denies pain     Hand Dominance Right   Extremity/Trunk Assessment Upper Extremity Assessment Upper Extremity Assessment: Overall WFL for tasks assessed   Lower Extremity Assessment Lower Extremity Assessment: Defer to PT evaluation   Cervical / Trunk Assessment Cervical / Trunk Assessment: Kyphotic  Communication Communication Communication: No difficulties   Cognition Arousal/Alertness: Awake/alert Behavior During Therapy: WFL for tasks assessed/performed Overall Cognitive Status: Impaired/Different from baseline Area of Impairment: Orientation;Memory;Awareness;Problem solving                 Orientation Level: Situation;Time;Place   Memory: Decreased  short-term memory     Awareness: Intellectual Problem Solving: Slow processing;Requires verbal cues General Comments: pt reports being at a different facility and seems shocked when educated at [place]. Pt able to recall information later in session     General Comments  VSS- 90% RA after transfer. pt reports normally no DME and reliant on it this session    Exercises     Shoulder Instructions      Home Living Family/patient expects to be discharged to:: Private residence Living Arrangements: Alone Available Help at Discharge: Available PRN/intermittently Type of Home: Apartment Home Access: Level entry     Home Layout: One level     Bathroom Shower/Tub: Occupational psychologist: Standard     Home Equipment: Cane - single point          Prior Functioning/Environment Prior Level of Function : Independent/Modified Independent;Driving                        OT Problem List: Decreased strength;Decreased activity tolerance;Impaired balance (sitting and/or standing);Decreased safety awareness;Decreased knowledge of use of DME or AE;Decreased knowledge of precautions;Decreased cognition      OT Treatment/Interventions: Self-care/ADL training;Therapeutic exercise;Neuromuscular education;Energy conservation;DME and/or AE instruction;Manual therapy;Cognitive remediation/compensation;Therapeutic activities;Patient/family education;Balance training    OT Goals(Current goals can be found in the care plan section) Acute Rehab OT Goals Patient Stated Goal: none stated but agreeable to oob for lunch OT Goal Formulation: With patient Time For Goal Achievement: 11/15/21 Potential to Achieve Goals: Good  OT Frequency: Min 2X/week   Barriers to D/C:            Co-evaluation              AM-PAC OT "6 Clicks" Daily Activity     Outcome Measure Help from another person eating meals?: A Little Help from another person taking care of personal grooming?:  A Little Help from another person toileting, which includes using toliet, bedpan, or urinal?: A Little Help from another person bathing (including washing, rinsing, drying)?: A Little Help from another person to put on and taking off regular upper body clothing?: A Little Help from another person to put on and taking off regular lower body clothing?: A Lot 6 Click Score: 17   End of Session Equipment Utilized During Treatment: Gait belt;Rolling walker (2 wheels) Nurse Communication: Mobility status;Precautions  Activity Tolerance: Patient tolerated treatment well Patient left: in chair;with call bell/phone within reach;with chair alarm set;Other (comment) (pending PT ken to take over session)  OT Visit Diagnosis: Unsteadiness on feet (R26.81);Muscle weakness (generalized) (M62.81)                Time: 7425-9563 OT Time Calculation (min): 24 min Charges:  OT General Charges $OT Visit: 1 Visit OT Evaluation $OT Eval Moderate Complexity: 1 Mod OT Treatments $Self Care/Home Management : 8-22 mins   Brynn, OTR/L  Acute Rehabilitation Services Pager: [phone] Office: [phone] .   Jeri Modena 11/01/2021, 1:56 PM

## 2021-11-01 NOTE — Progress Notes (Addendum)
Patient's daughter in law Maudie Mercury is visiting patient and asking about patient's hearing aids. States the facility [facility] called her and told her patient was wearing them when he left with EMS. Pt not wearing them currently and mental status not oriented enough to tell us where they may be. Was not told in report that patient had hearing aids only that he was hard of hearing  and they are not listed on the patient belongings assessment. Will call ED and MRI to see if they may be down there.   1130: After speaking to both ED and MRI, they do not have any missing hearing aids in their department. Dr. Alfonse Spruce stated this morning at the bedside that he remembers seeing patient wearing hearing aids yesterday in the ED at around [place] (daughter in law) the number to risk management after speaking with department director.

## 2021-11-01 NOTE — Progress Notes (Signed)
HD#0 Subjective:  Overnight Events: 1 episode of vomiting overnight.  Patient was given 1 dose of ondansetron and Maalox.  Patient is seen at bedside with his daughter.  He appears comfortable.  He is alert and oriented to self and time only.  Patient thinks that he is in [facility].  Per daughter, this is not normal.  He report shortness of breath with coughing.  Daughter report history of smoking a long time ago but does not have frequent COPD exacerbation.  He denies abdominal pain.  He does not know his last BM.  Daughter reports bowel issue but unsure of which type.  Objective:  Vital signs in last 24 hours: Vitals:   11/01/21 0026 11/01/21 0323 11/01/21 0600 11/01/21 0737  BP: (!) 192/79 (!) 178/71  (!) 174/70  Pulse:  70  66  Resp: 20 19  18   Temp:  98.1 F (36.7 C)  97.6 F (36.4 C)  TempSrc:  Axillary  Oral  SpO2: 98% 98%  97%  Weight:   78.2 kg   Height:       Supplemental O2: Room Air SpO2: 97 %   Physical Exam:  Physical Exam Constitutional:      General: He is not in acute distress. HENT:     Head: Normocephalic.  Eyes:     General:        Right eye: No discharge.        Left eye: No discharge.     Conjunctiva/sclera: Conjunctivae normal.  Cardiovascular:     Rate and Rhythm: Normal rate and regular rhythm.     Heart sounds: Normal heart sounds.     Comments: No LE edema Pulmonary:     Effort: No respiratory distress.     Comments: Tachypnea.  Mild diffuse wheezing.  No crackles heard Abdominal:     Comments: Abdomen distended.  Nontender to palpation.  Tympanic percussion in all 4 quadrants.  Bowel sound heard.  Skin:    General: Skin is warm.     Coloration: Skin is not jaundiced.  Neurological:     Mental Status: He is alert.  Psychiatric:        Mood and Affect: Mood normal.        Behavior: Behavior normal.    Filed Weights   10/31/21 1250 11/01/21 0600  Weight: 113.4 kg 78.2 kg     Intake/Output Summary (Last 24  hours) at 11/01/2021 0742 Last data filed at 11/01/2021 0128 Gross per 24 hour  Intake --  Output 900 ml  Net -900 ml   Net IO Since Admission: -900 mL [11/01/21 0742]  Pertinent Labs: CBC Latest Ref Rng & Units 11/01/2021 10/31/2021 10/31/2021  WBC 4.0 - 10.5 K/uL 13.5(H) - 9.2  Hemoglobin 13.0 - 17.0 g/dL 15.0 12.9(L) 15.0  Hematocrit 39.0 - 52.0 % 40.9 38.0(L) 43.8  Platelets 150 - 400 K/uL 179 - 191    CMP Latest Ref Rng & Units 11/01/2021 10/31/2021 10/31/2021  Glucose 70 - 99 mg/dL 114(H) 95 80  BUN 8 - 23 mg/dL 17 15 15   Creatinine 0.61 - 1.24 mg/dL 0.90 1.03 0.80  Sodium 135 - 145 mmol/L 123(L) 123(L) 126(L)  Potassium 3.5 - 5.1 mmol/L 3.9 4.1 3.6  Chloride 98 - 111 mmol/L 93(L) 95(L) 98  CO2 22 - 32 mmol/L 21(L) 19(L) -  Calcium 8.9 - 10.3 mg/dL 8.1(L) 8.3(L) -  Total Protein 6.5 - 8.1 g/dL - - -  Total Bilirubin 0.3 - 1.2 mg/dL - - -  Alkaline Phos 38 - 126 U/L - - -  AST 15 - 41 U/L - - -  ALT 0 - 44 U/L - - -    Imaging: DG Chest 1 View  Result Date: 10/31/2021 CLINICAL DATA:  Possible unwitnessed fall, forehead hematoma, trauma EXAM: CHEST  1 VIEW COMPARISON:  None. FINDINGS: Mild cardiomegaly and chronic vascular congestion. Negative for pneumonia, edema, effusion, or pneumothorax. Old posterior rib fractures bilaterally. Degenerative changes of the spine. Bones are osteopenic. Aorta atherosclerotic. IMPRESSION: Cardiomegaly and vascular congestion. No acute finding by plain radiography Aortic atherosclerosis Electronically Signed   By: Jerilynn Mages.  Shick M.D.   On: 10/31/2021 13:13   DG Pelvis 1-2 Views  Result Date: 10/31/2021 CLINICAL DATA:  Unwitnessed fall, trauma, possible injury EXAM: PELVIS - 1-2 VIEW COMPARISON:  None. FINDINGS: Bony pelvis and hips appear symmetric and intact. No acute osseous finding or displaced fracture. Degenerative changes of the lumbosacral spine, SI joints and both hips. Aortoiliac and femoral atherosclerosis present. Nonobstructive  bowel gas pattern. IMPRESSION: No acute finding by plain radiography. Electronically Signed   By: Jerilynn Mages.  Shick M.D.   On: 10/31/2021 13:15   DG ELBOW COMPLETE LEFT (3+VIEW)  Result Date: 10/31/2021 CLINICAL DATA:  Arm pain EXAM: LEFT ELBOW - COMPLETE 3+ VIEW COMPARISON:  None. FINDINGS: There is no evidence of fracture, dislocation, or joint effusion. There is no evidence of arthropathy or other focal bone abnormality. Soft tissues are unremarkable. IMPRESSION: Negative. Electronically Signed   By: Donavan Foil M.D.   On: 10/31/2021 17:45   CT HEAD WO CONTRAST  Result Date: 10/31/2021 CLINICAL DATA:  Unwitnessed fall, altered mental status EXAM: CT HEAD WITHOUT CONTRAST TECHNIQUE: Contiguous axial images were obtained from the base of the skull through the vertex without intravenous contrast. COMPARISON:  None. FINDINGS: Brain: Brain atrophy and advanced white matter microvascular changes throughout both cerebral hemispheres. No acute intracranial hemorrhage, mass lesion, new infarction, midline shift, herniation hydrocephalus, or extra-axial fluid collection. No focal mass effect or edema. Cisterns are patent. No cerebellar abnormality. Vascular: Intracranial atherosclerosis.  No hyperdense vessel. Skull: Remote suboccipital craniectomy. No other acute osseous finding. Mastoids are clear. Sinuses/Orbits: No acute finding. Other: None. IMPRESSION: Atrophy and advanced white matter microvascular ischemic changes. No acute intracranial abnormality by noncontrast CT. Remote suboccipital craniectomy. Electronically Signed   By: Jerilynn Mages.  Shick M.D.   On: 10/31/2021 13:24   CT CERVICAL SPINE WO CONTRAST  Result Date: 10/31/2021 CLINICAL DATA:  Unwitnessed fall, altered mental status EXAM: CT CERVICAL SPINE WITHOUT CONTRAST TECHNIQUE: Multidetector CT imaging of the cervical spine was performed without intravenous contrast. Multiplanar CT image reconstructions were also generated. COMPARISON:  None. FINDINGS:  Alignment: No subluxation dislocation. Skull base and vertebrae: No acute osseous finding or fracture. Bones are osteopenic. C1 arch is incomplete both anterior and posteriorly. Soft tissues and spinal canal: No prevertebral fluid or swelling. No visible canal hematoma. Disc levels: Degenerative changes throughout the cervical spine. Previous anterior cervical discectomy and fusion at C5-6. Solid fusion at the surgical level. Degenerative disc disease most pronounced at C4-5 and C6-7 with marked disc space narrowing, sclerosis and osteophytes. Multilevel posterior facet arthropathy, worse on the right. Facets are aligned. No subluxation or dislocation appreciated. Upper chest: Negative. Other: None. IMPRESSION: No acute cervical spine fracture or malalignment by CT. Osteopenia, degenerative changes and previous ACDF at C5-6. Electronically Signed   By: Jerilynn Mages.  Shick M.D.   On: 10/31/2021 13:21   MR BRAIN WO CONTRAST  Result Date: 10/31/2021 CLINICAL  DATA:  Encephalopathy EXAM: MRI HEAD WITHOUT CONTRAST TECHNIQUE: Multiplanar, multiecho pulse sequences of the brain and surrounding structures were obtained without intravenous contrast. COMPARISON:  None. FINDINGS: Brain: No acute infarct, mass effect or extra-axial collection. No acute or chronic hemorrhage. Hyperintense T2-weighted signal is moderately widespread throughout the white matter. Generalized volume loss without a clear lobar predilection. Old small vessel infarcts of the left basal ganglia and right cerebellum. The midline structures are normal. Vascular: Major flow voids are preserved. Skull and upper cervical spine: Normal calvarium and skull base. Visualized upper cervical spine and soft tissues are normal. Sinuses/Orbits:No paranasal sinus fluid levels or advanced mucosal thickening. No mastoid or middle ear effusion. Normal orbits. IMPRESSION: 1. No acute intracranial abnormality. 2. Generalized volume loss and findings of chronic ischemic  microangiopathy. Electronically Signed   By: Ulyses Jarred M.D.   On: 10/31/2021 21:25   US Abdomen Limited RUQ (LIVER/GB)  Result Date: 10/31/2021 CLINICAL DATA:  Abdominal distension. EXAM: ULTRASOUND ABDOMEN LIMITED RIGHT UPPER QUADRANT COMPARISON:  None. FINDINGS: Gallbladder: Multiple stones within the gallbladder. No gallbladder wall thickening or pericholecystic fluid. Negative sonographic Murphy's sign. Common bile duct: Diameter: 5 mm Liver: No focal lesion identified. Within normal limits in parenchymal echogenicity. Portal vein is patent on color Doppler imaging with normal direction of blood flow towards the liver. Other: None. IMPRESSION: Cholelithiasis without sonographic evidence of acute cholecystitis. Electronically Signed   By: Anner Crete M.D.   On: 10/31/2021 21:39    Assessment/Plan:   Principal Problem:   Acute encephalopathy   Patient Summary: Marcus Martin is a 87 year old male with past medical history of COPD, CAD s/p stents (2016/2017), HTN, PE (2016) and DVT S/p IVC filter placement (2019) on Eliquis, OSA on Cpap, vocal cord dysfunction who presents to the hospital for acute metabolic encephalopathy 2/2 acute on chronic hyponatremia and COPD exacerbation.  Acute metabolic encephalopathy Hypotonic hyponatremia Patient still have persistent encephalopathy this morning, confirmed by his daughter. PRES has been ruled out with brain MRI.  No signs of infection with normal UA and chest x-ray.  He is not on any centrally acting medications.  At this time his acute encephalopathy is likely secondary to acute on chronic hyponatremia. 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  No lung mass was seen on CTA in 2021.  No other signs of cancer.  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 -Fluid restriction of 1500 cc -PT/OT -Check orthostatic vital sign  COPD exacerbation Diffuse wheezing heard on exam. COPD exacerbation may  play a role in his acute encephalopathy.  However patient is not hypercarbic on ABG.   -Start prednisone 40 mg x 5d -Azithromycin 500 mg daily  -DuoNebs as needed -Continue home LAMA  Abdominal distention Worse than normal per daughter.  Right upper quadrant ultrasound did not show any ascites.  Liver and biliary structures were remarkable except for cholelithiasis.  KUB was negative for any obstruction but showed moderate stool burden. -Start bowel regimen with MiraLAX and Senokot  Hypertension Blood pressure elevated at 637 systolic today. -Will increase lisinopril to 40 mg -Not sure if patient is taking metoprolol at home.  Given his heart rate in the 60s, will hold off on starting.  History of PE and DVT s/p IVC filter -Continue Eliquis  CAD s/p stents Unremarkable echocardiogram in 2019  LDL 52 in 08/2021 -Continue atorvastatin 20 mg  OSA CPAP at bedtime  Diet: Heart Healthy IVF: None,None VTE: Eliquis  Code: DNR PT/OT recs: Home Health, none.  Dispo: Anticipated discharge to independent living facility pending improvement of mental status.  Gaylan Gerold, DO 11/01/2021, 7:42 AM Pager: [phone]  Please contact the on call pager after 5 pm and on weekends at [phone].

## 2021-11-01 NOTE — Progress Notes (Signed)
Patient refused CPAP for the night  

## 2021-11-02 DIAGNOSIS — I2510 Atherosclerotic heart disease of native coronary artery without angina pectoris: Secondary | ICD-10-CM | POA: Diagnosis present

## 2021-11-02 DIAGNOSIS — Z86718 Personal history of other venous thrombosis and embolism: Secondary | ICD-10-CM | POA: Diagnosis not present

## 2021-11-02 DIAGNOSIS — I252 Old myocardial infarction: Secondary | ICD-10-CM | POA: Diagnosis not present

## 2021-11-02 DIAGNOSIS — K2100 Gastro-esophageal reflux disease with esophagitis, without bleeding: Secondary | ICD-10-CM | POA: Diagnosis present

## 2021-11-02 DIAGNOSIS — K269 Duodenal ulcer, unspecified as acute or chronic, without hemorrhage or perforation: Secondary | ICD-10-CM | POA: Diagnosis present

## 2021-11-02 DIAGNOSIS — Z7901 Long term (current) use of anticoagulants: Secondary | ICD-10-CM | POA: Diagnosis not present

## 2021-11-02 DIAGNOSIS — Z86711 Personal history of pulmonary embolism: Secondary | ICD-10-CM | POA: Diagnosis not present

## 2021-11-02 DIAGNOSIS — J9601 Acute respiratory failure with hypoxia: Secondary | ICD-10-CM

## 2021-11-02 DIAGNOSIS — J441 Chronic obstructive pulmonary disease with (acute) exacerbation: Secondary | ICD-10-CM | POA: Diagnosis present

## 2021-11-02 DIAGNOSIS — Z955 Presence of coronary angioplasty implant and graft: Secondary | ICD-10-CM | POA: Diagnosis not present

## 2021-11-02 DIAGNOSIS — K920 Hematemesis: Secondary | ICD-10-CM | POA: Diagnosis not present

## 2021-11-02 DIAGNOSIS — E871 Hypo-osmolality and hyponatremia: Secondary | ICD-10-CM | POA: Diagnosis not present

## 2021-11-02 DIAGNOSIS — G4733 Obstructive sleep apnea (adult) (pediatric): Secondary | ICD-10-CM | POA: Diagnosis present

## 2021-11-02 DIAGNOSIS — R1314 Dysphagia, pharyngoesophageal phase: Secondary | ICD-10-CM | POA: Diagnosis present

## 2021-11-02 DIAGNOSIS — Z85820 Personal history of malignant melanoma of skin: Secondary | ICD-10-CM | POA: Diagnosis not present

## 2021-11-02 DIAGNOSIS — K8020 Calculus of gallbladder without cholecystitis without obstruction: Secondary | ICD-10-CM | POA: Diagnosis present

## 2021-11-02 DIAGNOSIS — Z66 Do not resuscitate: Secondary | ICD-10-CM | POA: Diagnosis present

## 2021-11-02 DIAGNOSIS — I4891 Unspecified atrial fibrillation: Secondary | ICD-10-CM | POA: Diagnosis present

## 2021-11-02 DIAGNOSIS — R140 Abdominal distension (gaseous): Secondary | ICD-10-CM | POA: Diagnosis present

## 2021-11-02 DIAGNOSIS — G9341 Metabolic encephalopathy: Secondary | ICD-10-CM | POA: Diagnosis present

## 2021-11-02 DIAGNOSIS — E785 Hyperlipidemia, unspecified: Secondary | ICD-10-CM | POA: Diagnosis present

## 2021-11-02 DIAGNOSIS — G9340 Encephalopathy, unspecified: Secondary | ICD-10-CM | POA: Diagnosis not present

## 2021-11-02 DIAGNOSIS — E861 Hypovolemia: Secondary | ICD-10-CM | POA: Diagnosis present

## 2021-11-02 DIAGNOSIS — Z20822 Contact with and (suspected) exposure to covid-19: Secondary | ICD-10-CM | POA: Diagnosis present

## 2021-11-02 DIAGNOSIS — E222 Syndrome of inappropriate secretion of antidiuretic hormone: Secondary | ICD-10-CM | POA: Diagnosis present

## 2021-11-02 DIAGNOSIS — H9193 Unspecified hearing loss, bilateral: Secondary | ICD-10-CM | POA: Diagnosis present

## 2021-11-02 DIAGNOSIS — I10 Essential (primary) hypertension: Secondary | ICD-10-CM | POA: Diagnosis present

## 2021-11-02 DIAGNOSIS — Z87891 Personal history of nicotine dependence: Secondary | ICD-10-CM | POA: Diagnosis not present

## 2021-11-02 DIAGNOSIS — Z95828 Presence of other vascular implants and grafts: Secondary | ICD-10-CM | POA: Diagnosis not present

## 2021-11-02 LAB — BASIC METABOLIC PANEL
Anion gap: 9 (ref 5–15)
Anion gap: 9 (ref 5–15)
BUN: 13 mg/dL (ref 8–23)
BUN: 18 mg/dL (ref 8–23)
CO2: 18 mmol/L — ABNORMAL LOW (ref 22–32)
CO2: 18 mmol/L — ABNORMAL LOW (ref 22–32)
Calcium: 7.7 mg/dL — ABNORMAL LOW (ref 8.9–10.3)
Calcium: 7.9 mg/dL — ABNORMAL LOW (ref 8.9–10.3)
Chloride: 94 mmol/L — ABNORMAL LOW (ref 98–111)
Chloride: 91 mmol/L — ABNORMAL LOW (ref 98–111)
Creatinine, Ser: 0.88 mg/dL (ref 0.61–1.24)
Creatinine, Ser: 0.86 mg/dL (ref 0.61–1.24)
GFR, Estimated: >60 mL/min (ref >60)
GFR, Estimated: >60 mL/min (ref >60)
Glucose, Bld: 86 mg/dL (ref 70–99)
Glucose, Bld: 128 mg/dL — ABNORMAL HIGH (ref 70–99)
Potassium: 4 mmol/L (ref 3.5–5.1)
Potassium: 4.3 mmol/L (ref 3.5–5.1)
Sodium: 121 mmol/L — ABNORMAL LOW (ref 135–145)
Sodium: 118 mmol/L — CL (ref 135–145)

## 2021-11-02 LAB — COMPREHENSIVE METABOLIC PANEL
ALT: 22 U/L (ref 0–44)
AST: 43 U/L — ABNORMAL HIGH (ref 15–41)
Albumin: 2.8 g/dL — ABNORMAL LOW (ref 3.5–5.0)
Alkaline Phosphatase: 62 U/L (ref 38–126)
Anion gap: 10 (ref 5–15)
BUN: 14 mg/dL (ref 8–23)
CO2: 17 mmol/L — ABNORMAL LOW (ref 22–32)
Calcium: 7.6 mg/dL — ABNORMAL LOW (ref 8.9–10.3)
Chloride: 92 mmol/L — ABNORMAL LOW (ref 98–111)
Creatinine, Ser: 1.04 mg/dL (ref 0.61–1.24)
GFR, Estimated: >60 mL/min (ref >60)
Glucose, Bld: 92 mg/dL (ref 70–99)
Potassium: 3.9 mmol/L (ref 3.5–5.1)
Sodium: 119 mmol/L — CL (ref 135–145)
Total Bilirubin: 2.2 mg/dL — ABNORMAL HIGH (ref 0.3–1.2)
Total Protein: 5.3 g/dL — ABNORMAL LOW (ref 6.5–8.1)

## 2021-11-02 LAB — SODIUM: Sodium: 120 mmol/L — ABNORMAL LOW (ref 135–145)

## 2021-11-02 LAB — CBC
HCT: 40.3 % (ref 39.0–52.0)
Hemoglobin: 14.7 g/dL (ref 13.0–17.0)
MCH: 32.2 pg (ref 26.0–34.0)
MCHC: 36.5 g/dL — ABNORMAL HIGH (ref 30.0–36.0)
MCV: 88.4 fL (ref 80.0–100.0)
Platelets: 184 10*3/uL (ref 150–400)
RBC: 4.56 MIL/uL (ref 4.22–5.81)
RDW: 12 % (ref 11.5–15.5)
WBC: 11.6 10*3/uL — ABNORMAL HIGH (ref 4.0–10.5)
nRBC: 0 % (ref 0.0–0.2)

## 2021-11-02 MED ORDER — SODIUM CHLORIDE 1 G PO TABS
1.0000 g | ORAL_TABLET | Freq: Three times a day (TID) | ORAL | Status: DC
  Administered 2021-11-02 – 2021-11-08 (×19): 1 g via ORAL
  Filled 2021-11-02 (×22): qty 1

## 2021-11-02 MED ORDER — AMLODIPINE BESYLATE 5 MG PO TABS
5.0000 mg | ORAL_TABLET | Freq: Every day | ORAL | Status: DC
  Administered 2021-11-02 – 2021-11-08 (×7): 5 mg via ORAL
  Filled 2021-11-02 (×7): qty 1

## 2021-11-02 MED ORDER — METOPROLOL TARTRATE 5 MG/5ML IV SOLN
5.0000 mg | Freq: Once | INTRAVENOUS | Status: AC
Start: 2021-11-03 — End: 2021-11-03
  Administered 2021-11-03: 5 mg via INTRAVENOUS
  Filled 2021-11-02: qty 5

## 2021-11-02 MED ORDER — IPRATROPIUM-ALBUTEROL 0.5-2.5 (3) MG/3ML IN SOLN
3.0000 mL | Freq: Three times a day (TID) | RESPIRATORY_TRACT | Status: DC
Start: 2021-11-02 — End: 2021-11-06
  Administered 2021-11-02 – 2021-11-06 (×11): 3 mL via RESPIRATORY_TRACT
  Filled 2021-11-02 (×11): qty 3

## 2021-11-02 MED ORDER — SODIUM CHLORIDE 1 G PO TABS
1.0000 g | ORAL_TABLET | Freq: Once | ORAL | Status: AC
  Administered 2021-11-02: 1 g via ORAL
  Filled 2021-11-02: qty 1

## 2021-11-02 NOTE — NC FL2 (Signed)
[place] MEDICAID FL2 LEVEL OF CARE SCREENING TOOL     IDENTIFICATION  Patient Name: Marcus Martin Birthdate: 1933/06/18 Sex: male Admission Date (Current Location): 10/31/2021  [facility] and [place] Number:  Herbalist and Address:  [facility], [place] [address], [place], [place] [address]      Provider Number: [id number]  Attending Physician Name and Address:  Velna Ochs, MD  Relative Name and Phone Number:       Current Level of Care: Hospital Recommended Level of Care: [place] Prior Approval Number:    Date Approved/Denied:   PASRR Number: [id number]  Discharge Plan: SNF    Current Diagnoses: Patient Active Problem List   Diagnosis Date Noted   COPD exacerbation ([place])    Abdominal distention    Fall    Hyponatremia    Acute encephalopathy 10/31/2021    Orientation RESPIRATION BLADDER Height & Weight     Self, Time, Place  Normal Continent Weight: 78.2 kg Height:  5\' 10"  (177.8 cm)  BEHAVIORAL SYMPTOMS/MOOD NEUROLOGICAL BOWEL NUTRITION STATUS      Continent Diet (heart healthy with 1200 CC Fluid restriction)  AMBULATORY STATUS COMMUNICATION OF NEEDS Skin   Limited Assist Verbally Skin abrasions (skin abrasion to Rt arm/ hematoma to forehead)                       Personal Care Assistance Level of Assistance  Bathing, Feeding, Dressing Bathing Assistance: Limited assistance Feeding assistance: Independent Dressing Assistance: Limited assistance     Functional Limitations Info  Sight, Hearing, Speech Sight Info: Adequate Hearing Info: Impaired Speech Info: Adequate    SPECIAL CARE FACTORS FREQUENCY  PT (By licensed PT), OT (By licensed OT)     PT Frequency: 5x/wk OT Frequency: 5x/wk            Contractures Contractures Info: Not present    Additional Factors Info  Allergies, Code Status Code Status Info: DNR Allergies Info: Ticagrelor   Tetanus Toxoids   Coffea Arabica    Niacin   Niacin And Related   Niacin Er   Simvastatin   Tiotropium           Current Medications (11/02/2021):  This is the current hospital active medication list Current Facility-Administered Medications  Medication Dose Route Frequency Provider Last Rate Last Admin   acetaminophen (TYLENOL) tablet 650 mg  650 mg Oral Q6H PRN Gaylan Gerold, DO       Or   acetaminophen (TYLENOL) suppository 650 mg  650 mg Rectal Q6H PRN Gaylan Gerold, DO       amLODipine (NORVASC) tablet 5 mg  5 mg Oral Daily Virl Axe, MD   5 mg at 11/02/21 1213   apixaban (ELIQUIS) tablet 5 mg  5 mg Oral BID Gaylan Gerold, DO   5 mg at 11/02/21 0240   atorvastatin (LIPITOR) tablet 20 mg  20 mg Oral Daily Gaylan Gerold, DO   20 mg at 11/02/21 9735   ipratropium-albuterol (DUONEB) 0.5-2.5 (3) MG/3ML nebulizer solution 3 mL  3 mL Nebulization TID Virl Axe, MD   3 mL at 11/02/21 1026   lisinopril (ZESTRIL) tablet 40 mg  40 mg Oral Daily Gaylan Gerold, DO   40 mg at 11/02/21 3299   polyethylene glycol (MIRALAX / GLYCOLAX) packet 17 g  17 g Oral Daily Gaylan Gerold, DO   17 g at 11/02/21 0921   predniSONE (DELTASONE) tablet 40 mg  40 mg Oral Q  breakfast Gaylan Gerold, DO   40 mg at 11/02/21 2248   senna-docusate (Senokot-S) tablet 1 tablet  1 tablet Oral QHS Gaylan Gerold, DO   1 tablet at 11/01/21 2118   sodium chloride tablet 1 g  1 g Oral TID WC Virl Axe, MD   1 g at 11/02/21 1213   umeclidinium bromide (INCRUSE ELLIPTA) 62.5 MCG/ACT 1 puff  1 puff Inhalation Daily Gaylan Gerold, DO   1 puff at 11/02/21 2500     Discharge Medications: Please see discharge summary for a list of discharge medications.  Relevant Imaging Results:  Relevant Lab Results:   Additional Information SS#: [id number]  Pollie Friar, RN

## 2021-11-02 NOTE — Progress Notes (Addendum)
Subjective: Patient's sodium levels decreased to 119 in the morning. Patient was seen at bedside while he was pleasantly eating breakfast. Pt reports that he gets chronic tension headaches but otherwise does not endorse any new complaints this AM. Pt had a BM last night. Per nurse and pt, he has not had any new onset HA, fatigue, worsening confusion, restlessness, muscle cramps, or seizures. No other complains or concerns at this time.   Objective:  Vital signs in last 24 hours: Vitals:   11/01/21 1639 11/01/21 1933 11/01/21 2359 11/02/21 0428  BP: (!) 146/65 (!) 177/73 (!) 185/72 (!) 174/81  Pulse:  70 70 72  Resp:  19 20 19   Temp:  98 F (36.7 C) 97.8 F (36.6 C) 97.8 F (36.6 C)  TempSrc:  Oral Oral Oral  SpO2:  97% 95% 96%  Weight:      Height:       General Physical Exam Constitutional: alert, well-appearing, in no acute distress HENT: normocephalic, atraumatic, mucous membranes moist Eyes: conjunctiva non-erythematous, extraocular movements intact Neck: supple Cardiovascular: regular rate and rhythm, no m/r/g Pulmonary/Chest: Wheezing greater in upper lobes than lower lobes bilaterally, normal work of breathing on room air, lungs clear to auscultation bilaterally  Abdominal: soft, non-tender to palpation, distended, normoactive bowel sounds  MSK: normal bulk and tone. No LE edema. Neurological: alert & oriented to person, place and time Skin: warm and dry, multiple sun spots present on upper arm bilaterally Psych: normal behavior, normal affect   Assessment/Plan:  Principal Problem:   Acute encephalopathy Active Problems:   Abdominal distention   Fall   Hyponatremia  Marcus Martin is a 87 year old male with past medical history of COPD, CAD s/p stents (2016/2017), HTN, PE (2016) and DVT S/p IVC filter placement (2019) on Eliquis, OSA on Cpap, vocal cord dysfunction who presents to the hospital for acute metabolic encephalopathy 2/2 acute on chronic hyponatremia  and COPD exacerbation.   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 He denied new onset HA, vomiting, muscle weakness, and seizures. Daughter in law notes that patient's mentation is better compared to yesterday. Thus far, PRES has been ruled out with brain MRI. Infection has been ruled out with normal UA and chest x-ray.  He is not on any centrally acting medications. At this time his acute encephalopathy is likely secondary to acute on chronic hyponatremia. 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  No lung mass was seen on CTA in 2021.  No other signs of cancer.  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 Patient is currently on azithromycin for COPD exacerbation which can possibly exacerbate hyponatremia.  -Will discontinue azithromycin at this time. -Fluid restriction of 1200 cc -1g Sodium chloride tablets TID -PT/OT -Check orthostatic vital signs   COPD exacerbation Diffuse wheezing heard on exam. COPD exacerbation may play a role in his acute encephalopathy.  However patient is not hypercarbic on ABG.   -Start prednisone 40 mg x 5d -Azithromycin 500 mg daily  -DuoNebs TID -Continue home LAMA   Abdominal distention Right upper quadrant ultrasound did not show any ascites.  Liver and biliary structures were remarkable except for cholelithiasis.  KUB was negative for any obstruction but showed moderate stool burden. Pt had a BM last night. -Continue bowel regimen with MiraLAX and Senokot   Hypertension OSA Blood pressure elevated at 027 systolic today. Patient did refuse his nightly  CPAP yesterday. OSA-induced hypooxia and hypercapnia related increase in BP. -Continue lisinopril to 40 mg -Discontinue 10 mg IV labetalol if SVP >180 or DBP >100 -Start on 5 mg amlodipine QD -Recommend using CPAP at bedtime   History of PE and  DVT s/p IVC filter -Continue Eliquis   CAD s/p stents Unremarkable echocardiogram in 2019  LDL 52 in 08/2021 -Continue atorvastatin 20 mg     Diet: Heart Healthy IVF: None,None VTE: Eliquis  Code: DNR PT/OT recs: Home Health, none.  Signature: Claudean Kinds, [place] [facility]  Pager: [phone] 6:56 AM, 11/02/2021

## 2021-11-02 NOTE — Progress Notes (Addendum)
Physical Therapy Treatment Patient Details Name: Marcus Martin MRN: [id number] DOB: [birth year] Today's Date: 11/02/2021   History of Present Illness Pt is an 87 y/o male, presenting 11/28 from [place] for confusion and weakness.  PMHX: Diverticulitis (self-reported), NSTEMI s/p PCI, afib, COPD, HTN, PE with IVC filter, peripheral neuropathy, bil hearing loss.    PT Comments    Pt received in supine, agreeable to therapy session with encouragement and with good participation and fair tolerance for gait trial. Pt self-reports cane vs no AD at baseline with ambulation so trial of single point cane today and pt needing minA/modA for stability. Pt very dyspneic and wheezing with exertion, DOE 3/4 and RN notified, however SpO2 WFL on RA with exertion. Pt had some concerns when his lunch tray set up due to diverticulitis and RN notified, he also self-reports choking at times when attempting to swallow. Pt would benefit from short term post-acute rehab prior to return to ILF due to noted balance deficits and poor activity tolerance, discussed with supervising PT and disposition updated below, case manager notified.   Recommendations for follow up therapy are one component of a multi-disciplinary discharge planning process, led by the attending physician.  Recommendations may be updated based on patient status, additional functional criteria and insurance authorization.  Follow Up Recommendations  Skilled nursing-short term rehab (<3 hours/day)     Assistance Recommended at Discharge Frequent or constant Supervision/Assistance  Equipment Recommendations  Other (comment) (pt reports he has cane, he may have access to RW (upright walker?) may need to confirm with facility? would benefit from RW if he doesn't have one)    Recommendations for Other Services Speech consult     Precautions / Restrictions Precautions Precautions: Fall Precaution Comments: urinary incontinence (check  condom cath) Restrictions Weight Bearing Restrictions: No     Mobility  Bed Mobility Overal bed mobility: Needs Assistance Bed Mobility: Supine to Sit     Supine to sit: Min assist     General bed mobility comments: he needed sequencing cues and use of rail with min guard to get mostly upright then minA at trunk to sustain static sitting balance.    Transfers Overall transfer level: Needs assistance   Transfers: Sit to/from Stand Sit to Stand: Min assist;Mod assist           General transfer comment: initial modA for sit>stand from EOB>cane, then minA from toilet with cane and wall railing support    Ambulation/Gait Ambulation/Gait assistance: Min assist;Mod assist Gait Distance (Feet): 45 Feet Assistive device: Straight cane;1 person hand held assist Gait Pattern/deviations: Step-through pattern;Drifts right/left;Decreased stride length Gait velocity: grossly <0.3 m/s     General Gait Details: pt dyspneic to 3/4 with exertion and audible wheezing, RN notified he may need breathing treatment (pt reports some dyspnea with exertion at baseline). Pt less steady using cane  with increased postural sway (reports he uses cane vs no AD at baseline) and needing up to [facility] for stability with turns and dense cues for activity pacing/safety. At times he needs HHA on opposite hand than cane.      Balance Overall balance assessment: Needs assistance Sitting-balance support: Single extremity supported Sitting balance-Leahy Scale: Fair Sitting balance - Comments: progressed to supervision for static sitting, initially minA/min guard   Standing balance support: Single extremity supported;During functional activity Standing balance-Leahy Scale: Poor Standing balance comment: reliant on cane and at times also needs HHA due to instability, does better with BUE support for gait  Cognition Arousal/Alertness: Awake/alert Behavior During Therapy:  WFL for tasks assessed/performed Overall Cognitive Status: Impaired/Different from baseline Area of Impairment: Orientation;Memory;Awareness;Problem solving;Safety/judgement        Orientation Level: Time   Memory: Decreased short-term memory;Decreased recall of precautions   Safety/Judgement: Decreased awareness of safety;Decreased awareness of deficits Awareness: Intellectual Problem Solving: Slow processing;Requires verbal cues General Comments: HoH, pt needs some cues for improved technique with functional mobility tasks and for safety;gives minimal warning when he needs to urinate, unfortunately catheter was clogged (although appeared intact) so urine leaked out around sides of tubing. Pt self-reports some choking when eating and special dietary needs due to diverticulitis (this was not in PMH above) at end of session so RN/SLP notified he may need further swallow assessment.        Exercises      General Comments General comments (skin integrity, edema, etc.): SpO2 95-97% on RA wtih exertion although DOE 3/4 and notably increased work of breathing/wheezing, Therapist, sports notified. HR WFL. Condom cath appears intact however leaks when pt urinates so pt used toilet and had BM as well once there.      Pertinent Vitals/Pain Pain Assessment: Faces Faces Pain Scale: Hurts little more Pain Location: "all over", pt unable to localize Pain Descriptors / Indicators: Discomfort Pain Intervention(s): Limited activity within patient's tolerance;Monitored during session;Repositioned     PT Goals (current goals can now be found in the care plan section) Acute Rehab PT Goals PT Goal Formulation: With patient Time For Goal Achievement: 11/08/21 Progress towards PT goals: Progressing toward goals    Frequency    Min 3X/week      PT Plan Discharge plan needs to be updated;Equipment recommendations need to be updated       AM-PAC PT "6 Clicks" Mobility   Outcome Measure  Help needed  turning from your back to your side while in a flat bed without using bedrails?: A Little Help needed moving from lying on your back to sitting on the side of a flat bed without using bedrails?: A Little Help needed moving to and from a bed to a chair (including a wheelchair)?: A Lot (mod cues) Help needed standing up from a chair using your arms (e.g., wheelchair or bedside chair)?: A Lot Help needed to walk in hospital room?: A Lot Help needed climbing 3-5 steps with a railing? : A Lot 6 Click Score: 14    End of Session Equipment Utilized During Treatment: Gait belt Activity Tolerance: Patient tolerated treatment well Patient left: in chair;with call bell/phone within reach;with chair alarm set Nurse Communication: Mobility status;Other (comment) (pt self-reports some issues with choking when eating and dietary needs due to diverticulitis, RN/SLP notified he may need swallow assessment) PT Visit Diagnosis: Other abnormalities of gait and mobility (R26.89)     Time: 1230-1305 PT Time Calculation (min) (ACUTE ONLY): 35 min  Charges:  $Gait Training: 8-22 mins $Therapeutic Activity: 8-22 mins                     Khylei Wilms P., PTA Acute Rehabilitation Services Pager: [phone] Office: [place] 11/02/2021, 1:35 PM

## 2021-11-02 NOTE — Progress Notes (Signed)
Date and time results received: 11/02/21 0620   Test: Sodium  Critical Value: 119  Name of Provider Notified: Humphrey Rolls MD  Orders Received? Or Actions Taken?:  None

## 2021-11-02 NOTE — Progress Notes (Signed)
Date and time results received: 11/02/21 2237  (use smartphrase ".now" to insert current time)  Test: Na Critical Value: 118  Name of Provider Notified: Dr. Rick Duff  Orders Received? Or Actions Taken?: Actions Taken: Notified provider, patient remains asymptomatic. Continue current plan of care.

## 2021-11-02 NOTE — TOC Initial Note (Signed)
Transition of Care [facility]) - Initial/Assessment Note    Patient Details  Name: Marcus Martin MRN: [id number] Date of Birth: Nov 14, 1933  [facility]) CM/SW Contact:    Pollie Friar, RN Phone Number: 11/02/2021, 1:23 PM  Clinical Narrative:                 Patient is from [place]. He lives alone in his apartment. CM met with the patient as the recommendations have changed to SNF rehab. Pt is agreeable to SNF rehab at [facility]. CM has completed FL2 and sent it to [facility].  Pt will need insurance auth prior to admission. CM following for pt to be closer to ready for d/c to start the auth.  TOC following.  Expected Discharge Plan: Skilled Nursing Facility Barriers to Discharge: Continued Medical Work up   Patient Goals and CMS Choice   CMS Medicare.gov Compare Post Acute Care list provided to:: Patient Choice offered to / list presented to : Patient  Expected Discharge Plan and Services Expected Discharge Plan: [place] In-house Referral: Clinical Social Work Discharge Planning Services: CM Consult Post Acute Care Choice: [place] Living arrangements for the past 2 months: Apartment                                      Prior Living Arrangements/Services Living arrangements for the past 2 months: Apartment Lives with:: Facility Resident, Self Patient language and need for interpreter reviewed:: Yes Do you feel safe going back to the place where you live?: Yes      Need for Family Participation in Patient Care: Yes (Comment) Care giver support system in place?: No (comment)   Criminal Activity/Legal Involvement Pertinent to Current Situation/Hospitalization: No - Comment as needed  Activities of Daily Living      Permission Sought/Granted                  Emotional Assessment Appearance:: Appears stated age Attitude/Demeanor/Rapport: Engaged Affect (typically observed):  Accepting Orientation: : Oriented to Self, Oriented to Place, Oriented to Situation   Psych Involvement: No (comment)  Admission diagnosis:  Abdominal distention [R14.0] Confusion [R41.0] Hyponatremia [E87.1] Trauma [T14.90XA] Left arm pain [M79.602] Acute encephalopathy [G93.40] Contusion of face, initial encounter [S00.83XA] Fall, initial encounter [W19.XXXA] Patient Active Problem List   Diagnosis Date Noted   COPD exacerbation ([place])    Abdominal distention    Fall    Hyponatremia    Acute encephalopathy 10/31/2021   PCP:  Curly Rim, MD Pharmacy:   [place], [place] - [address] [place] [address] Phone: [phone] Fax: [phone]     Social Determinants of Health (SDOH) Interventions    Readmission Risk Interventions No flowsheet data found.

## 2021-11-03 ENCOUNTER — Encounter (HOSPITAL_COMMUNITY): Payer: Self-pay | Admitting: Internal Medicine

## 2021-11-03 DIAGNOSIS — G9340 Encephalopathy, unspecified: Secondary | ICD-10-CM | POA: Diagnosis not present

## 2021-11-03 LAB — CBC WITH DIFFERENTIAL/PLATELET
Abs Immature Granulocytes: 0.04 10*3/uL (ref 0.00–0.07)
Basophils Absolute: 0 10*3/uL (ref 0.0–0.1)
Basophils Relative: 0 %
Eosinophils Absolute: 0 10*3/uL (ref 0.0–0.5)
Eosinophils Relative: 0 %
HCT: 39.2 % (ref 39.0–52.0)
Hemoglobin: 14.5 g/dL (ref 13.0–17.0)
Immature Granulocytes: 0 %
Lymphocytes Relative: 15 %
Lymphs Abs: 1.6 10*3/uL (ref 0.7–4.0)
MCH: 32.3 pg (ref 26.0–34.0)
MCHC: 37 g/dL — ABNORMAL HIGH (ref 30.0–36.0)
MCV: 87.3 fL (ref 80.0–100.0)
Monocytes Absolute: 1.4 10*3/uL — ABNORMAL HIGH (ref 0.1–1.0)
Monocytes Relative: 13 %
Neutro Abs: 7.5 10*3/uL (ref 1.7–7.7)
Neutrophils Relative %: 72 %
Platelets: 203 10*3/uL (ref 150–400)
RBC: 4.49 MIL/uL (ref 4.22–5.81)
RDW: 11.9 % (ref 11.5–15.5)
WBC: 10.6 10*3/uL — ABNORMAL HIGH (ref 4.0–10.5)
nRBC: 0 % (ref 0.0–0.2)

## 2021-11-03 LAB — BASIC METABOLIC PANEL
Anion gap: 9 (ref 5–15)
Anion gap: 9 (ref 5–15)
Anion gap: 10 (ref 5–15)
BUN: 16 mg/dL (ref 8–23)
BUN: 18 mg/dL (ref 8–23)
BUN: 20 mg/dL (ref 8–23)
CO2: 19 mmol/L — ABNORMAL LOW (ref 22–32)
CO2: 18 mmol/L — ABNORMAL LOW (ref 22–32)
CO2: 16 mmol/L — ABNORMAL LOW (ref 22–32)
Calcium: 7.8 mg/dL — ABNORMAL LOW (ref 8.9–10.3)
Calcium: 7.9 mg/dL — ABNORMAL LOW (ref 8.9–10.3)
Calcium: 7.9 mg/dL — ABNORMAL LOW (ref 8.9–10.3)
Chloride: 93 mmol/L — ABNORMAL LOW (ref 98–111)
Chloride: 92 mmol/L — ABNORMAL LOW (ref 98–111)
Chloride: 94 mmol/L — ABNORMAL LOW (ref 98–111)
Creatinine, Ser: 0.81 mg/dL (ref 0.61–1.24)
Creatinine, Ser: 0.97 mg/dL (ref 0.61–1.24)
Creatinine, Ser: 0.85 mg/dL (ref 0.61–1.24)
GFR, Estimated: >60 mL/min (ref >60)
GFR, Estimated: >60 mL/min (ref >60)
GFR, Estimated: >60 mL/min (ref >60)
Glucose, Bld: 101 mg/dL — ABNORMAL HIGH (ref 70–99)
Glucose, Bld: 157 mg/dL — ABNORMAL HIGH (ref 70–99)
Glucose, Bld: 143 mg/dL — ABNORMAL HIGH (ref 70–99)
Potassium: 3.8 mmol/L (ref 3.5–5.1)
Potassium: 4 mmol/L (ref 3.5–5.1)
Potassium: 4.5 mmol/L (ref 3.5–5.1)
Sodium: 121 mmol/L — ABNORMAL LOW (ref 135–145)
Sodium: 119 mmol/L — CL (ref 135–145)
Sodium: 120 mmol/L — ABNORMAL LOW (ref 135–145)

## 2021-11-03 LAB — SODIUM
Sodium: 118 mmol/L — CL (ref 135–145)
Sodium: 121 mmol/L — ABNORMAL LOW (ref 135–145)

## 2021-11-03 MED ORDER — MAGNESIUM HYDROXIDE 400 MG/5ML PO SUSP: 30.0000 mL | Freq: Every day | ORAL | Status: DC

## 2021-11-03 MED ORDER — CALCIUM CARBONATE ANTACID 500 MG PO CHEW
1.0000 | CHEWABLE_TABLET | Freq: Two times a day (BID) | ORAL | Status: DC
  Administered 2021-11-03 – 2021-11-05 (×4): 200 mg via ORAL
  Filled 2021-11-03 (×5): qty 1

## 2021-11-03 MED ORDER — FUROSEMIDE 20 MG PO TABS
20.0000 mg | ORAL_TABLET | Freq: Two times a day (BID) | ORAL | Status: DC
  Administered 2021-11-03 – 2021-11-05 (×4): 20 mg via ORAL
  Filled 2021-11-03 (×4): qty 1

## 2021-11-03 MED ORDER — ALBUTEROL SULFATE HFA 108 (90 BASE) MCG/ACT IN AERS: 2.0000 | INHALATION_SPRAY | Freq: Four times a day (QID) | RESPIRATORY_TRACT | Status: DC | PRN

## 2021-11-03 MED ORDER — MAGNESIUM HYDROXIDE 400 MG/5ML PO SUSP
30.0000 mL | Freq: Every day | ORAL | Status: DC
  Administered 2021-11-03 – 2021-11-05 (×3): 30 mL via ORAL
  Filled 2021-11-03 (×3): qty 30

## 2021-11-03 MED ORDER — ALBUTEROL SULFATE (2.5 MG/3ML) 0.083% IN NEBU
2.5000 mg | INHALATION_SOLUTION | Freq: Four times a day (QID) | RESPIRATORY_TRACT | Status: DC | PRN
  Administered 2021-11-07: 2.5 mg via RESPIRATORY_TRACT

## 2021-11-03 MED ORDER — MOMETASONE FURO-FORMOTEROL FUM 200-5 MCG/ACT IN AERO
2.0000 | INHALATION_SPRAY | Freq: Two times a day (BID) | RESPIRATORY_TRACT | Status: DC
  Administered 2021-11-04 – 2021-11-08 (×9): 2 via RESPIRATORY_TRACT
  Filled 2021-11-03: qty 8.8

## 2021-11-03 NOTE — Progress Notes (Signed)
Na 118, critcal lab value. Message sent to Dr. Eulas Post in regards to unchanged level.

## 2021-11-03 NOTE — Progress Notes (Signed)
Notified Dr. Eulas Post patient having persistent hiccups and some confusion (reoriented easily). No new orders at the time of notification.

## 2021-11-03 NOTE — Progress Notes (Addendum)
Subjective: Patient's sodium levels decreased to 118 in the morning. Patient was seen at bedside. Pt reports that he has been complaint with his medications and does not endorse any new complaints this AM. Pt denies any new onset HA, fatigue, worsening confusion, restlessness, nausea, vomiting, muscle weakness, or seizures.    Objective:  Vital signs in last 24 hours: Vitals:   11/03/21 0821 11/03/21 0847 11/03/21 0848 11/03/21 1239  BP: (!) 153/58   (!) 159/60  Pulse: 78   84  Resp: 18     Temp: 97.7 F (36.5 C)   98.6 F (37 C)  TempSrc: Oral   Oral  SpO2: 99% 98% 98% 98%  Weight:      Height:       General Physical Exam Constitutional: alert, well-appearing, in no acute distress HENT: normocephalic, atraumatic, mucous membranes moist Eyes: conjunctiva non-erythematous, extraocular movements intact Neck: supple Cardiovascular: regular rate and rhythm, no m/r/g Pulmonary/Chest: Mild diffuse wheezing in all lobes, normal work of breathing on room air, lungs clear to auscultation bilaterally  Abdominal: soft, non-tender to palpation, distended, normoactive bowel sounds  MSK: normal bulk and tone. No LE edema. Neurological: alert & oriented to person, place and time Skin: warm and dry, multiple sun spots present on upper arm bilaterally Psych: normal behavior, normal affect   Assessment/Plan:  Principal Problem:   Acute encephalopathy Active Problems:   Abdominal distention   Fall   Hyponatremia   COPD exacerbation ([place])  Marcus Martin is a 87 year old male with past medical history of COPD, CAD s/p stents (2016/2017), HTN, PE (2016) and DVT S/p IVC filter placement (2019) on Eliquis, OSA on Cpap, vocal cord dysfunction who presents to the hospital for acute metabolic encephalopathy 2/2 acute on chronic hyponatremia and COPD exacerbation.   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 He denied new onset HA, vomiting, muscle weakness, and seizures. Thus far, PRES has been ruled out with brain MRI. Infection has been ruled out with normal UA and chest x-ray.  He is not on any centrally acting medications. At this time his acute encephalopathy is likely secondary to acute on chronic hyponatremia. 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 No lung mass was seen on CTA in 2021.  No other signs of cancer. COPD may be the cause of his SIADH or it may be idiopathic. Literature indicates that appropriate escalation of treatment in chronic hyponatremia with a urine osmolality greater than 500 is starting a loop diuretic along with oral salt supplementation. Pt had a urine osm of 502 on admission.  -Start lasix 20 mg BID. -Fluid restriction of 1200 cc -Continue 1g Sodium chloride tablets TID -PT/OT -Monitor BMP Q8H   COPD exacerbation Diffuse wheezing heard on exam. COPD exacerbation may play a role in his acute encephalopathy.  However patient is not hypercarbic on ABG.   -continue prednisone 40 mg x 5d -DuoNebs TID -Continue home LAMA   Abdominal distention Right upper quadrant ultrasound did not show any ascites.  Liver and biliary structures were remarkable except for cholelithiasis.  KUB was negative for any obstruction but showed moderate stool burden. Pt had a BM last night. -Continue bowel regimen with MiraLAX and Senokot   Hypertension OSA Blood pressure elevated at 119 systolic today. Patient did refuse his nightly CPAP yesterday. OSA-induced hypooxia and hypercapnia related increase in BP. -Continue  lisinopril to 40 mg -Continue 5 mg amlodipine QD -Restart using CPAP at bedtime   History of PE and DVT s/p IVC filter -Continue Eliquis   CAD s/p stents Unremarkable echocardiogram in 2019  LDL 52 in 08/2021 -Continue atorvastatin 20 mg     Diet: Heart  Healthy IVF: None,None VTE: Eliquis  Code: DNR PT/OT recs: Home Health, none.  Signature: Claudean Kinds, [place] [facility]  Pager: [phone] 12:51 PM, 11/03/2021   Attestation for Student Documentation:  I personally was present and performed or re-performed the history, physical exam and medical decision-making activities of this service and have verified that the service and findings are accurately documented in the student's note.  Virl Axe, MD 11/04/2021, 6:34 AM

## 2021-11-03 NOTE — Progress Notes (Signed)
Occupational Therapy Treatment Patient Details Name: Marcus Martin MRN: [id number] DOB: 1933/12/03 Today's Date: 11/03/2021   History of present illness Pt is an 87 y/o male, presenting 11/28 from [place] for confusion and weakness.  PMHX: Diverticulitis (self-reported), NSTEMI s/p PCI, afib, COPD, HTN, PE with IVC filter, peripheral neuropathy, bil hearing loss.   OT comments  Pt making good progress with activity tolerance this session. This  session pt sat EOB for 30 mins to complete Pill box test, then wanted to transfer to the chair to eat his dinner. Pt required increased time to complete Pill Box test. He was unable to complete the test in 5 mins, was able to complete it with no errors in 30 mins, due to poor attention and poor fine motor skills. OT [facility] to his [facility] and assist with pill box/medication management. OT will continue to follow up acutely.    Recommendations for follow up therapy are one component of a multi-disciplinary discharge planning process, led by the attending physician.  Recommendations may be updated based on patient status, additional functional criteria and insurance authorization.    Follow Up Recommendations  Home health OT    Assistance Recommended at Discharge Intermittent Supervision/Assistance  Equipment Recommendations  None recommended by OT    Recommendations for Other Services      Precautions / Restrictions Precautions Precautions: Fall Restrictions Weight Bearing Restrictions: No       Mobility Bed Mobility Overal bed mobility: Needs Assistance Bed Mobility: Supine to Sit     Supine to sit: Min assist     General bed mobility comments: Min A to rise to sitting    Transfers Overall transfer level: Needs assistance Equipment used: Rolling walker (2 wheels) Transfers: Sit to/from Stand;Bed to chair/wheelchair/BSC Sit to Stand: Min assist   Step pivot  transfers: Min assist       General transfer comment: Min A to power up and steady     Balance Overall balance assessment: Needs assistance Sitting-balance support: Single extremity supported Sitting balance-Leahy Scale: Fair Sitting balance - Comments: Able to sit for 30 mins with no support   Standing balance support: Reliant on assistive device for balance Standing balance-Leahy Scale: Poor Standing balance comment: Reliant on RW and min A for steadying                           ADL either performed or assessed with clinical judgement   ADL Overall ADL's : Needs assistance/impaired Eating/Feeding: Set up;Sitting Eating/Feeding Details (indicate cue type and reason): Left eating dinner at end of session                     Toilet Transfer: Minimal assistance;Stand-pivot Toilet Transfer Details (indicate cue type and reason): Min A to power up Toileting- Clothing Manipulation and Hygiene: Moderate assistance;Sit to/from stand;Sitting/lateral lean Toileting - Clothing Manipulation Details (indicate cue type and reason): Pt using urinal during session       General ADL Comments: Session focused on pill box test.    Extremity/Trunk Assessment              Vision       Perception     Praxis      Cognition Arousal/Alertness: Awake/alert Behavior During Therapy: WFL for tasks assessed/performed Overall Cognitive Status: No family/caregiver present to determine baseline cognitive functioning  General Comments: HoH, pt needs some cues for improved technique with functional mobility tasks and for safety. Pt needs increased time for pill box test          Exercises     Shoulder Instructions       General Comments Completed Pill BOx test, Unable to complete within 5 minute time frame. Pt made no errors, however it took him 30 mins to complete task. Pt has poor fine motor skills and easily gets off  topic, however he recalls where he is supposed to put the pills as he continues.    Pertinent Vitals/ Pain       Pain Assessment: No/denies pain  Home Living Family/patient expects to be discharged to:: Private residence Living Arrangements: Alone Available Help at Discharge: Available PRN/intermittently Type of Home: Apartment Home Access: Level entry     Home Layout: One level     Bathroom Shower/Tub: Occupational psychologist: Standard     Home Equipment: Radio producer - single point          Prior Functioning/Environment              Frequency  Min 2X/week        Progress Toward Goals  OT Goals(current goals can now be found in the care plan section)  Progress towards OT goals: Progressing toward goals  Acute Rehab OT Goals Patient Stated Goal: To go home OT Goal Formulation: With patient Time For Goal Achievement: 11/15/21 Potential to Achieve Goals: Good ADL Goals Pt Will Perform Upper Body Bathing: with supervision Pt Will Perform Lower Body Bathing: with supervision Pt Will Transfer to Toilet: with supervision Additional ADL Goal #1: Pt will complete bed mobility supervision as precursor to adls.  Plan Discharge plan remains appropriate;Frequency remains appropriate    Co-evaluation                 AM-PAC OT "6 Clicks" Daily Activity     Outcome Measure   Help from another person eating meals?: A Little Help from another person taking care of personal grooming?: A Little Help from another person toileting, which includes using toliet, bedpan, or urinal?: A Little Help from another person bathing (including washing, rinsing, drying)?: A Little Help from another person to put on and taking off regular upper body clothing?: A Little Help from another person to put on and taking off regular lower body clothing?: A Little 6 Click Score: 18    End of Session Equipment Utilized During Treatment: Gait belt;Rolling walker (2 wheels)  OT  Visit Diagnosis: Unsteadiness on feet (R26.81);Muscle weakness (generalized) (M62.81)   Activity Tolerance Patient tolerated treatment well   Patient Left in chair;with call bell/phone within reach;with chair alarm set   Nurse Communication Mobility status        Time: 5400-8676 OT Time Calculation (min): 45 min  Charges: OT General Charges $OT Visit: 1 Visit OT Treatments $Self Care/Home Management : 38-52 mins  Ishika Chesterfield H., OTR/L Acute Rehabilitation  [place] 11/03/2021, 6:54 PM

## 2021-11-04 DIAGNOSIS — G9340 Encephalopathy, unspecified: Secondary | ICD-10-CM | POA: Diagnosis not present

## 2021-11-04 DIAGNOSIS — E871 Hypo-osmolality and hyponatremia: Secondary | ICD-10-CM | POA: Diagnosis not present

## 2021-11-04 LAB — BASIC METABOLIC PANEL
Anion gap: 9 (ref 5–15)
Anion gap: 9 (ref 5–15)
BUN: 20 mg/dL (ref 8–23)
BUN: 22 mg/dL (ref 8–23)
CO2: 21 mmol/L — ABNORMAL LOW (ref 22–32)
CO2: 21 mmol/L — ABNORMAL LOW (ref 22–32)
Calcium: 8.5 mg/dL — ABNORMAL LOW (ref 8.9–10.3)
Calcium: 8.2 mg/dL — ABNORMAL LOW (ref 8.9–10.3)
Chloride: 92 mmol/L — ABNORMAL LOW (ref 98–111)
Chloride: 93 mmol/L — ABNORMAL LOW (ref 98–111)
Creatinine, Ser: 0.96 mg/dL (ref 0.61–1.24)
Creatinine, Ser: 1.06 mg/dL (ref 0.61–1.24)
GFR, Estimated: >60 mL/min (ref >60)
GFR, Estimated: >60 mL/min (ref >60)
Glucose, Bld: 120 mg/dL — ABNORMAL HIGH (ref 70–99)
Glucose, Bld: 173 mg/dL — ABNORMAL HIGH (ref 70–99)
Potassium: 3.8 mmol/L (ref 3.5–5.1)
Potassium: 4.1 mmol/L (ref 3.5–5.1)
Sodium: 122 mmol/L — ABNORMAL LOW (ref 135–145)
Sodium: 123 mmol/L — ABNORMAL LOW (ref 135–145)

## 2021-11-04 MED ORDER — TAMSULOSIN HCL 0.4 MG PO CAPS
0.4000 mg | ORAL_CAPSULE | Freq: Every day | ORAL | Status: DC
  Administered 2021-11-04 – 2021-11-08 (×5): 0.4 mg via ORAL
  Filled 2021-11-04 (×6): qty 1

## 2021-11-04 MED ORDER — FERROUS SULFATE 325 (65 FE) MG PO TABS
325.0000 mg | ORAL_TABLET | Freq: Every day | ORAL | Status: DC
  Administered 2021-11-05 – 2021-11-08 (×3): 325 mg via ORAL
  Filled 2021-11-04 (×3): qty 1

## 2021-11-04 NOTE — Progress Notes (Signed)
   Subjective: No acute overnight events. Patient was seen at bedside during rounds this morning. Pt reports that his SOB is improving this AM. He states that he was able to use his CPAP overnight which is helping him. Patient denies abdominal pain, nausea, vomiting, constipation, new onset HA or muscle weakness. No other complains or concerns at this time.   Objective:  Vital signs in last 24 hours: Vitals:   11/04/21 0309 11/04/21 0843 11/04/21 0845 11/04/21 0846  BP: (!) 155/68     Pulse: 84     Resp: 18     Temp: 98 F (36.7 C)     TempSrc: Oral     SpO2: 93% 96% 96% 96%  Weight:      Height:       General Physical Exam Constitutional: alert, well-appearing, in no acute distress HENT: normocephalic, atraumatic, mucous membranes moist Eyes: conjunctiva non-erythematous, extraocular movements intact Neck: supple Cardiovascular: regular rate and rhythm, no m/r/g Pulmonary/Chest: normal work of breathing on room air, diffuse wheezing present in all lobes. Abdominal: soft, non-tender to palpation, non-distended MSK: normal bulk and tone Neurological: alert & oriented x 3  Skin: warm and dry Psych: normal behavior, normal affect   Assessment/Plan:  Principal Problem:   Acute encephalopathy Active Problems:   Abdominal distention   Fall   Hyponatremia   COPD exacerbation ([place])  Marcus Martin is a 87 year old male with past medical history of COPD, CAD s/p stents (2016/2017), HTN, PE (2016) and DVT S/p IVC filter placement (2019) on Eliquis, OSA on Cpap, and vocal cord dysfunction who presents to the hospital for acute metabolic encephalopathy 2/2 acute on chronic hyponatremia and COPD exacerbation.   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 Literature indicates that appropriate escalation of treatment in chronic hyponatremia with a urine  osmolality greater than 500 is starting a loop diuretic along with oral salt supplementation. Pt had a urine osm of 502 on admission. As patient is improving clinically, plan is to continue the following treatment. -Continue lasix 20 mg BID. -Fluid restriction of 1200 cc -Continue 1g Sodium chloride tablets TID -PT/OT -Monitor Na and K levels on BMP Q8H   COPD exacerbation Diffuse wheezing heard on exam. COPD exacerbation may play a role in his acute encephalopathy.  However patient is not hypercarbic on ABG.   -continue prednisone 40 mg x 5d -DuoNebs TID -Continue home LAMA   Hypertension OSA Likely due to OSA-induced hypooxia and hypercapnia related changes in BP. Blood pressure improved to SBP 150s today. He is using his CPAP which shows clinical improvement  -Continue lisinopril to 40 mg -Continue 5 mg amlodipine QD -Continue using CPAP at bedtime   Abdominal distention Right upper quadrant ultrasound did not show any ascites.  Liver and biliary structures were remarkable except for cholelithiasis.  KUB was negative for any obstruction but showed moderate stool burden. Pt continue to have daily BM. -Continue bowel regimen with MiraLAX and Senokot  History of PE and DVT s/p IVC filter -Continue Eliquis   CAD s/p stents Unremarkable echocardiogram in 2019  LDL 52 in 08/2021 -Continue atorvastatin 20 mg     Diet: Heart Healthy IVF: None,None VTE: Eliquis  Code: DNR PT/OT recs: Home Health, none.  Signature: Claudean Kinds, [facility]  Pager: [phone] 6:37 AM, 11/04/2021

## 2021-11-04 NOTE — Progress Notes (Addendum)
Physical Therapy Treatment Patient Details Name: Marcus Martin MRN: [id number] DOB: [birth year] Today's Date: 11/04/2021   History of Present Illness Pt is an 87 y/o male, presenting 11/28 from [facility] for confusion and weakness.  PMHX: Diverticulitis (self-reported), NSTEMI s/p PCI, afib, COPD, HTN, PE with IVC filter, peripheral neuropathy, bil hearing loss.    PT Comments    Pt received in supine and agreeable to therapy session. Pt c/o pain while swallowing solid foods and noted to be having hiccups during session, RN notified. Pt became dyspneic with ambulation, 3/4 DOE, and vitals became orthostatic: Vitals  11/04/2021 26/07/3418  Systolic Standing 622 Sitting 297  Diastolic 63 68  Pulse 989 84  He continues to require up to modA for short distance gait tasks using cane and HHA. Continue to recommend SNF level therapies upon DC. Pt continues to benefit from PT services to progress toward functional mobility goals.       Recommendations for follow up therapy are one component of a multi-disciplinary discharge planning process, led by the attending physician.  Recommendations may be updated based on patient status, additional functional criteria and insurance authorization.  Follow Up Recommendations  Skilled nursing-short term rehab (<3 hours/day)     Assistance Recommended at Discharge Frequent or constant Supervision/Assistance  Equipment Recommendations  Other (comment) (pt reports he has cane, he may have access to RW (upright walker?) may need to confirm with facility? would benefit from RW if he doesn't have one)    Recommendations for Other Services Speech consult     Precautions / Restrictions Precautions Precautions: Fall Precaution Comments: urinary incontinence (check condom cath) Restrictions Weight Bearing Restrictions: No     Mobility  Bed Mobility Overal bed mobility: Needs Assistance Bed Mobility: Supine to Sit    Supine to sit: Min  guard    General bed mobility comments: use of rail and increased time, needs cues for sequencing    Transfers Overall transfer level: Needs assistance Equipment used: Straight cane Transfers: Sit to/from Stand Sit to Stand: Min assist;Mod assist   General transfer comment: initial modA for sit>stand from EOB>cane, then minA from toilet to cane and chair to cane    Ambulation/Gait Ambulation/Gait assistance: Min assist;Mod assist Gait Distance (Feet): 20 Feet (20 ft, seated break, 5 ft, seated break, 50ft) Assistive device: Straight cane Gait Pattern/deviations: Step-through pattern;Drifts right/left;Decreased stride length Gait velocity: grossly <0.3 m/s     General Gait Details: pt dyspneic to 3/4 with exertion and audible wheezing, RN notified that he requested use of CPAP at end of session. Pt less steady using cane with increased postural sway and occasional furniture surfing and needing up to modA for stability with turns and dense cues for activity pacing/safety.      Balance Overall balance assessment: Needs assistance Sitting-balance support: Single extremity supported Sitting balance-Leahy Scale: Fair    Standing balance support: Single extremity supported;During functional activity Standing balance-Leahy Scale: Poor Standing balance comment: reliant on cane and at times furniture surfing for stability      Cognition Arousal/Alertness: Awake/alert Behavior During Therapy: WFL for tasks assessed/performed Overall Cognitive Status: Impaired/Different from baseline Area of Impairment: Orientation;Memory;Awareness;Problem solving;Safety/judgement   Orientation Level: Time   Memory: Decreased short-term memory;Decreased recall of precautions   Safety/Judgement: Decreased awareness of safety;Decreased awareness of deficits Awareness: Intellectual Problem Solving: Slow processing;Requires verbal cues General Comments: HoH, pt needs some cues for improved technique  with functional mobility tasks and for safety; Pt self-reports some choking and pain when swallowing  solid foods and special dietary needs due to diverticulitis (this was not in [place] above) at end of session so RN/SLP notified he may need further swallow assessment.            Pertinent Vitals/Pain Pain Location: "all over", pt unable to localize Pain Descriptors / Indicators: Discomfort Pain Intervention(s): Monitored during session     PT Goals (current goals can now be found in the care plan section) Acute Rehab PT Goals Patient Stated Goal: back home PT Goal Formulation: With patient Time For Goal Achievement: 11/08/21 Progress towards PT goals: Progressing toward goals    Frequency    Min 3X/week      PT Plan Current plan remains appropriate       AM-PAC PT "6 Clicks" Mobility   Outcome Measure  Help needed turning from your back to your side while in a flat bed without using bedrails?: A Little Help needed moving from lying on your back to sitting on the side of a flat bed without using bedrails?: A Little Help needed moving to and from a bed to a chair (including a wheelchair)?: A Lot (mod cues) Help needed standing up from a chair using your arms (e.g., wheelchair or bedside chair)?: A Lot Help needed to walk in hospital room?: A Lot Help needed climbing 3-5 steps with a railing? : A Lot 6 Click Score: 14    End of Session Equipment Utilized During Treatment: Gait belt Activity Tolerance: Patient tolerated treatment well Patient left: in chair;with call bell/phone within reach;with chair alarm set Nurse Communication: Mobility status;Other (comment) (pt self-reports some issues with choking and pain when swallowing food and dietary needs due to diverticulitis, RN/SLP notified he may need swallow assessment) PT Visit Diagnosis: Other abnormalities of gait and mobility (R26.89)     Time: 2919-1660 PT Time Calculation (min) (ACUTE ONLY): 26 min  Charges:  $Gait  Training: 8-22 mins $Therapeutic Activity: 8-22 mins                     Evelene Croon, Student PTA CI: Carly P., PTA  Carly M Poff 11/04/2021, 4:57 PM

## 2021-11-04 NOTE — Care Management Important Message (Signed)
Important Message  Patient Details  Name: Marcus Martin MRN: [id number] Date of Birth: 15-Feb-1933   Medicare Important Message Given:  Yes     Hannah Beat 11/04/2021, 3:28 PM

## 2021-11-04 NOTE — TOC Progression Note (Signed)
Transition of Care [facility]) - Progression Note    Patient Details  Name: Marcus Martin MRN: [id number] Date of Birth: 1933/12/01  [facility] of [facility] CM/SW Contact  Pollie Friar, RN Phone Number: 11/04/2021, 10:19 AM  Clinical Narrative:    Pt still with low sodium level. MD stating may be another few days before ready for rehab. CM has updated pts daughter in law, Maudie Mercury and Talbert Forest at [place].  MD aware will need to start insurance authorization a day before d/c.  TOC following.   Expected Discharge Plan: [place] Barriers to Discharge: Continued Medical Work up  Expected Discharge Plan and Services Expected Discharge Plan: [place] In-house Referral: Clinical Social Work Discharge Planning Services: CM Consult Post Acute Care Choice: [place] Living arrangements for the past 2 months: Apartment                                       Social Determinants of Health (SDOH) Interventions    Readmission Risk Interventions No flowsheet data found.

## 2021-11-04 NOTE — Care Management Important Message (Signed)
Important Message  Patient Details  Name: Marcus Martin MRN: [id number] Date of Birth: [birth year]   Medicare Important Message Given:  Yes     Hannah Beat 11/04/2021, 3:29 PM

## 2021-11-05 LAB — BASIC METABOLIC PANEL
Anion gap: 8 (ref 5–15)
Anion gap: 7 (ref 5–15)
BUN: 20 mg/dL (ref 8–23)
BUN: 21 mg/dL (ref 8–23)
CO2: 23 mmol/L (ref 22–32)
CO2: 25 mmol/L (ref 22–32)
Calcium: 8.4 mg/dL — ABNORMAL LOW (ref 8.9–10.3)
Calcium: 8.3 mg/dL — ABNORMAL LOW (ref 8.9–10.3)
Chloride: 95 mmol/L — ABNORMAL LOW (ref 98–111)
Chloride: 94 mmol/L — ABNORMAL LOW (ref 98–111)
Creatinine, Ser: 0.97 mg/dL (ref 0.61–1.24)
Creatinine, Ser: 1.04 mg/dL (ref 0.61–1.24)
GFR, Estimated: >60 mL/min (ref >60)
GFR, Estimated: >60 mL/min (ref >60)
Glucose, Bld: 107 mg/dL — ABNORMAL HIGH (ref 70–99)
Glucose, Bld: 163 mg/dL — ABNORMAL HIGH (ref 70–99)
Potassium: 3.5 mmol/L (ref 3.5–5.1)
Potassium: 3.7 mmol/L (ref 3.5–5.1)
Sodium: 126 mmol/L — ABNORMAL LOW (ref 135–145)
Sodium: 126 mmol/L — ABNORMAL LOW (ref 135–145)

## 2021-11-05 LAB — CBC
HCT: 40.4 % (ref 39.0–52.0)
Hemoglobin: 14.8 g/dL (ref 13.0–17.0)
MCH: 32.3 pg (ref 26.0–34.0)
MCHC: 36.6 g/dL — ABNORMAL HIGH (ref 30.0–36.0)
MCV: 88.2 fL (ref 80.0–100.0)
Platelets: 227 10*3/uL (ref 150–400)
RBC: 4.58 MIL/uL (ref 4.22–5.81)
RDW: 12.1 % (ref 11.5–15.5)
WBC: 11.7 10*3/uL — ABNORMAL HIGH (ref 4.0–10.5)
nRBC: 0 % (ref 0.0–0.2)

## 2021-11-05 MED ORDER — PANTOPRAZOLE SODIUM 40 MG PO TBEC
40.0000 mg | DELAYED_RELEASE_TABLET | Freq: Two times a day (BID) | ORAL | Status: DC
  Administered 2021-11-05 – 2021-11-06 (×2): 40 mg via ORAL
  Filled 2021-11-05 (×3): qty 1

## 2021-11-05 MED ORDER — SODIUM CHLORIDE 0.9 % IV SOLN: INTRAVENOUS | Status: AC

## 2021-11-05 MED ORDER — PANTOPRAZOLE SODIUM 40 MG PO TBEC
40.0000 mg | DELAYED_RELEASE_TABLET | Freq: Every day | ORAL | Status: DC
  Administered 2021-11-05: 40 mg via ORAL
  Filled 2021-11-05: qty 1

## 2021-11-05 MED ORDER — SUCRALFATE 1 GM/10ML PO SUSP
1.0000 g | Freq: Three times a day (TID) | ORAL | Status: DC
  Administered 2021-11-05 – 2021-11-08 (×12): 1 g via ORAL
  Filled 2021-11-05 (×15): qty 10

## 2021-11-05 NOTE — Plan of Care (Signed)

## 2021-11-05 NOTE — Evaluation (Signed)
Clinical/Bedside Swallow Evaluation Patient Details  Name: Marcus Martin MRN: [id number] Date of Birth: 12-08-1932  Today's Date: 11/05/2021 Time: SLP Start Time (ACUTE ONLY): 16 SLP Stop Time (ACUTE ONLY): 1425 SLP Time Calculation (min) (ACUTE ONLY): 20 min  Past Medical History:  Past Medical History:  Diagnosis Date   Bilateral hearing loss    COPD (chronic obstructive pulmonary disease) (HCC)    DVT (deep venous thrombosis) (HCC)    GERD (gastroesophageal reflux disease)    History of pulmonary embolus (PE)    HLD (hyperlipidemia)    HTN (hypertension)    Melanoma (HCC)    NSTEMI (non-ST elevated myocardial infarction) (HCC)    OSA (obstructive sleep apnea)    Peripheral neuropathy    Squamous cell skin cancer    Past Surgical History:  Past Surgical History:  Procedure Laterality Date   PERCUTANEOUS CORONARY STENT INTERVENTION (PCI-S)     RCA   HPI:  Pt is an 87 y/o male, presenting 11/28 from [place] for confusion and weakness.  PMHX: Diverticulitis (self-reported), NSTEMI s/p PCI, afib, COPD, HTN, PE with IVC filter, peripheral neuropathy, bil hearing loss, h/o ACDF. Current cervical spinal CT showed cervical osteophytes.  Patient reported difficulty swallowing, prompting this BSE.    Assessment / Plan / Recommendation  Clinical Impression  Patient presents with clinical s/s of dysphagia as per this bedside swallow evaluation. Based on his report of discomfort and difficulty swallowing solids and liquids (does not endorse any change in swallow or discomfort with solids versus liquids), and h/o ACDF with current cervical spine CT showing osteophytes, SLP is suspecting a potential structural dysphagia from cervical osteophytes. Patient reports his dysphagia started at the hospital and he denies any h/o dysphagia prior to this hospitalization. (no family to confirm). SLP is recommending to continue with current PO diet at this time and likely proceed with  objecitve swallow study on Monday (12/4) to r/o dysphagia. Patient in agreement. SLP Visit Diagnosis: Dysphagia, unspecified (R13.10)    Aspiration Risk  Mild aspiration risk    Diet Recommendation Regular;Thin liquid   Liquid Administration via: Straw;Cup Medication Administration: Whole meds with liquid Compensations: Minimize environmental distractions;Slow rate;Small sips/bites Postural Changes: Seated upright at 90 degrees    Other  Recommendations Oral Care Recommendations: Oral care BID    Recommendations for follow up therapy are one component of a multi-disciplinary discharge planning process, led by the attending physician.  Recommendations may be updated based on patient status, additional functional criteria and insurance authorization.  Follow up Recommendations Other (comment) (pending progress)      Assistance Recommended at Discharge Intermittent Supervision/Assistance  Functional Status Assessment Patient has had a recent decline in their functional status and demonstrates the ability to make significant improvements in function in a reasonable and predictable amount of time.  Frequency and Duration min 1 x/week  1 week       Prognosis Prognosis for Safe Diet Advancement: Good      Swallow Study   General Date of Onset: 11/03/21 HPI: Pt is an 87 y/o male, presenting 11/28 from [place] for confusion and weakness.  PMHX: Diverticulitis (self-reported), NSTEMI s/p PCI, afib, COPD, HTN, PE with IVC filter, peripheral neuropathy, bil hearing loss, h/o ACDF. Current cervical spinal CT showed cervical osteophytes.  Patient reported difficulty swallowing, prompting this BSE. Type of Study: Bedside Swallow Evaluation Previous Swallow Assessment: none found Diet Prior to this Study: Regular;Thin liquids Temperature Spikes Noted: No Respiratory Status: Room air History of Recent  Intubation: No Behavior/Cognition: Alert;Cooperative;Pleasant mood Oral  Cavity Assessment: Within Functional Limits Oral Care Completed by SLP: No Oral Cavity - Dentition: Adequate natural dentition Vision: Functional for self-feeding Self-Feeding Abilities: Able to feed self Patient Positioning: Upright in bed Baseline Vocal Quality: Hoarse;Other (comment) (patient reporting hoarse voice is normal) Volitional Cough: Strong Volitional Swallow: Able to elicit    Oral/Motor/Sensory Function Overall Oral Motor/Sensory Function: Within functional limits   Ice Chips     Thin Liquid Thin Liquid: Impaired Presentation: Straw;Self Fed Pharyngeal  Phase Impairments: Other (comments) Other Comments: patient observed with slight grimacing at times when swallowing as well as appearing to have to put forth increased effort during pharyngeal phase of swallow.    Nectar Thick     Honey Thick     Puree Puree: Not tested   Solid     Solid: Not tested     Sonia Baller, MA, CCC-SLP Speech Therapy

## 2021-11-05 NOTE — Plan of Care (Signed)
?  Problem: Clinical Measurements: ?Goal: Will remain free from infection ?Outcome: Progressing ?Goal: Diagnostic test results will improve ?Outcome: Progressing ?Goal: Respiratory complications will improve ?Outcome: Progressing ?  ?

## 2021-11-05 NOTE — Progress Notes (Addendum)
Subjective: No acute overnight events. Patient was seen at bedside during rounds this morning. Pt complains of worsening dyspepsia since admission and one episode of regurgitation of blood tinged  gastric contents. He reports that he has severe retrosternal chest pain from mouth to stomach associated with episodes of regurgitation, which occur spontaneously and not associated with eating (however, he did experience discomfort while drinking water in our presence).   He notes that he had a black BM this morning (and a similarly black BM earlier in the week). He states that he noticed some lightheadedness today while attempting to get up. He also endorses frequent burping and hiccups for few days. Pt notes that his SOB is improving. No other complains or concerns at this time.   Objective:  Vital signs in last 24 hours: Vitals:   11/04/21 2349 11/05/21 0353 11/05/21 0822 11/05/21 1237  BP: (!) 122/54 140/61 (!) 151/56 (!) 121/59  Pulse: 79 95 84 81  Resp: 18 19 18 19   Temp: 98.3 F (36.8 C) 98.4 F (36.9 C) 97.7 F (36.5 C) 98 F (36.7 C)  TempSrc: Oral Oral Oral Oral  SpO2: 96% 99% 99% 98%  Weight:      Height:       General Physical Exam Constitutional: alert, well-appearing, in no acute distress HENT: normocephalic, atraumatic, mucous membranes moist; very HOH Eyes: conjunctiva non-erythematous, extraocular movements intact Neck: supple Cardiovascular: regular rate and rhythm, no m/r/g Pulmonary/Chest: normal work of breathing on room air, Wheezing present diffusely in all lobes, greater in lower lobes. Abdominal: soft, epigastric tenderness to palpation, tympanic, distended, bowel sounds present MSK: normal bulk and tone Neurological: alert & oriented x 3  Skin: warm and dry. Bruises present in bilateral upper extremities (on Eliquis). Poor skin turgor over extremities and anterior upper chest. Psych: normal behavior, normal affect   Assessment/Plan:  Principal Problem:    Acute encephalopathy Active Problems:   Abdominal distention   Fall   Hyponatremia   COPD exacerbation ([place])  Marcus Martin is a 87 year old male with past medical history of COPD, CAD s/p stents (2016/2017), HTN, PE (2016) and DVT S/p IVC filter placement (2019) on Eliquis, OSA on Cpap, and vocal cord dysfunction who presents to the hospital for acute metabolic encephalopathy 2/2 acute on chronic hyponatremia and COPD exacerbation.   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 Pt complained of lightheadedness with orthostatic movement this morning and has decreased skin turgor on exam.  He has been very volume restricted and lasix was started to address  his hyponatremia.  To address developing hypovolemia which can also be associated with hyponatremia (and which can lead to weakness and falls), we will adjust treatment. -Discontinue lasix 20 mg BID. -Fluid restriction of 1200 cc (to restrict hypotonic fluid). -Start NS IVF -Continue 1g Sodium chloride tablets TID -PT/OT -Monitor Na and K levels on BMP Q8H -Orthostatic vitals    Severe epigastric and esophageal pain 2/2 possible GERD vs PUD vs esophagitis  Pt complained of severe retrosternal chest pain related to episodes of regurgitation and has history of GERD for which he is chronically takes pepto bismol and protonix. He also complained of multiple episodes of dark BM which could be evidence of UGIB. H and H is 14.8 and 40.4 at this time. On chart review, he does have a history of using alendroate and oral iron supplements which  are associated with pill esophagitis.   -Start Carafate 1 g TID with meals  -Escalating Protonix 20 mg to BID dosing . Will monitor symptom relief. -Obtain CBC to monitor hemoglobin. -Possible GI consult and EGD if symptoms persist on PPIs  and carafate.  -Check stool for heme to verify black color is melena vs residual bismuth   COPD exacerbation Diffuse wheezing heard on exam. COPD exacerbation may play a role in his acute encephalopathy.  However patient is not hypercarbic on ABG.   -continue prednisone 40 mg x 5d -DuoNebs TID -Continue home LAMA   Hypertension OSA Likely due to OSA-induced hypooxia and hypercapnia related changes in BP. Blood pressure improved to SBP 150s today. He is using his CPAP which shows clinical improvement  -Continue lisinopril to 40 mg -Continue 5 mg amlodipine QD -Continue using CPAP at bedtime   Abdominal distention Right upper quadrant ultrasound did not show any ascites.  Liver and biliary structures were remarkable except for cholelithiasis.  KUB was negative for any obstruction but showed moderate stool burden. Pt continue to have daily BM. -Continue bowel regimen with MiraLAX and Senokot   History of PE and DVT s/p IVC filter -Continue Eliquis   CAD s/p stents Unremarkable echocardiogram in 2019  LDL 52 in 08/2021 -Continue atorvastatin 20 mg     Diet: Heart Healthy IVF: None,None VTE: Eliquis  Code: DNR PT/OT recs: Home Health, none.  Signature: Claudean Kinds, [facility]  Pager: [phone] 1:05 PM, 11/05/2021

## 2021-11-05 NOTE — Plan of Care (Signed)
  Problem: Clinical Measurements: Goal: Ability to maintain clinical measurements within normal limits will improve Outcome: Progressing Goal: Will remain free from infection Outcome: Progressing Goal: Diagnostic test results will improve Outcome: Progressing Goal: Respiratory complications will improve Outcome: Progressing   Problem: Safety: Goal: Ability to remain free from injury will improve Outcome: Progressing

## 2021-11-06 ENCOUNTER — Inpatient Hospital Stay (HOSPITAL_COMMUNITY): Payer: Medicare Other

## 2021-11-06 LAB — BASIC METABOLIC PANEL
Anion gap: 9 (ref 5–15)
Anion gap: 6 (ref 5–15)
BUN: 22 mg/dL (ref 8–23)
BUN: 30 mg/dL — ABNORMAL HIGH (ref 8–23)
CO2: 25 mmol/L (ref 22–32)
CO2: 25 mmol/L (ref 22–32)
Calcium: 8.3 mg/dL — ABNORMAL LOW (ref 8.9–10.3)
Calcium: 8.2 mg/dL — ABNORMAL LOW (ref 8.9–10.3)
Chloride: 94 mmol/L — ABNORMAL LOW (ref 98–111)
Chloride: 97 mmol/L — ABNORMAL LOW (ref 98–111)
Creatinine, Ser: 0.96 mg/dL (ref 0.61–1.24)
Creatinine, Ser: 1.05 mg/dL (ref 0.61–1.24)
GFR, Estimated: >60 mL/min (ref >60)
GFR, Estimated: >60 mL/min (ref >60)
Glucose, Bld: 125 mg/dL — ABNORMAL HIGH (ref 70–99)
Glucose, Bld: 158 mg/dL — ABNORMAL HIGH (ref 70–99)
Potassium: 3.6 mmol/L (ref 3.5–5.1)
Potassium: 4.2 mmol/L (ref 3.5–5.1)
Sodium: 128 mmol/L — ABNORMAL LOW (ref 135–145)
Sodium: 128 mmol/L — ABNORMAL LOW (ref 135–145)

## 2021-11-06 LAB — CBC
HCT: 40.5 % (ref 39.0–52.0)
Hemoglobin: 14.7 g/dL (ref 13.0–17.0)
MCH: 32.2 pg (ref 26.0–34.0)
MCHC: 36.3 g/dL — ABNORMAL HIGH (ref 30.0–36.0)
MCV: 88.8 fL (ref 80.0–100.0)
Platelets: 247 10*3/uL (ref 150–400)
RBC: 4.56 MIL/uL (ref 4.22–5.81)
RDW: 12.4 % (ref 11.5–15.5)
WBC: 14.7 10*3/uL — ABNORMAL HIGH (ref 4.0–10.5)
nRBC: 0 % (ref 0.0–0.2)

## 2021-11-06 MED ORDER — ALUM & MAG HYDROXIDE-SIMETH 200-200-20 MG/5ML PO SUSP
30.0000 mL | Freq: Once | ORAL | Status: AC
  Administered 2021-11-06: 07:00:00 30 mL via ORAL
  Filled 2021-11-06: qty 30

## 2021-11-06 MED ORDER — ALUM & MAG HYDROXIDE-SIMETH 200-200-20 MG/5ML PO SUSP
30.0000 mL | Freq: Two times a day (BID) | ORAL | Status: DC
  Administered 2021-11-06 – 2021-11-08 (×4): 30 mL via ORAL
  Filled 2021-11-06 (×4): qty 30

## 2021-11-06 MED ORDER — PANTOPRAZOLE SODIUM 40 MG IV SOLR
40.0000 mg | Freq: Two times a day (BID) | INTRAVENOUS | Status: DC
  Administered 2021-11-06 – 2021-11-07 (×4): 40 mg via INTRAVENOUS
  Filled 2021-11-06 (×4): qty 40

## 2021-11-06 MED ORDER — ALUM & MAG HYDROXIDE-SIMETH 200-200-20 MG/5ML PO SUSP
30.0000 mL | Freq: Once | ORAL | Status: AC
  Administered 2021-11-06: 01:00:00 30 mL via ORAL
  Filled 2021-11-06: qty 30

## 2021-11-06 MED ORDER — PANTOPRAZOLE SODIUM 40 MG PO TBEC: 40.0000 mg | DELAYED_RELEASE_TABLET | Freq: Two times a day (BID) | ORAL | Status: DC

## 2021-11-06 NOTE — Progress Notes (Signed)
Paged Dr a couple times throughout the night because pt said he feels so bloated and has indigestion that is not being relieved.  Dr did order Mylanta and it helped him for a short while then indigestion returned and he takes the yankeur and gags himself to throw up for some kind of relief.  I did page the dr back to tell him what's been going on and that he still feels like the meds are not helping him.  Dr did say he will come up to see him.  He is on CPAP and takes it off often to make self gag. Once I told pt MD will be up to see him, he said good and is resting now.

## 2021-11-06 NOTE — Progress Notes (Signed)
Patient had complaint of acid reflux at midnight and Maalox was ordered. Patient had relieve with this but had another similar episode early morning again. When went to see patient at bedside, he endorsed the feeling of his chest being on fire and his throat burning. When I asked him what helps, he stated the Maalox helped him more than the milk of magnesia. Another dose of Maalox ordered for the patient. He did receive Carafate and Protonix at the same time yesterday which may prevent Protonix from being absorbed.

## 2021-11-06 NOTE — Progress Notes (Signed)
Placed patient on CPAP via FFM, previous settings of 10.0 cm H2O.

## 2021-11-06 NOTE — Consult Note (Signed)
CONSULT NOTE FOR [facility]  Reason for Consult: Singultus and GERD Referring Physician: Teaching Service  Marcus Martin HPI: This is an 87 year old male with a PMH of afib, NSTEMI HTN, hyperlipidemia, PE with IVC, and GERD initially admitted for confusion.  Over the course of the hospitalization the patient was treated for SIADH and it was felt to be the source of his N/V and encephalopathy.  During this hospitalization he states that he started to develop singultus and regurgitation.  He states that this was not a problem for him when he was at home and this is a new development for him.  There is a reported dysphagia to both solids and liquids.  Speech path evaluated the patient and there is the suspicion of a structural dysphagia from the cervical osteophytes.  Despite being on PPI BID, Maalox, and sucralfate he continues to complain about severe burning in this epigastric and lower chest region.  The patient does not have a history of undergoing an EGD.  Upon admission his head CT and MRI were negative for any acute abnormalities to explain his singultus.  Today's CXR was negative for any acute abnormalities.  Past Medical History:  Diagnosis Date   Bilateral hearing loss    COPD (chronic obstructive pulmonary disease) (HCC)    DVT (deep venous thrombosis) (HCC)    GERD (gastroesophageal reflux disease)    History of pulmonary embolus (PE)    HLD (hyperlipidemia)    HTN (hypertension)    Melanoma (HCC)    NSTEMI (non-ST elevated myocardial infarction) (HCC)    OSA (obstructive sleep apnea)    Peripheral neuropathy    Squamous cell skin cancer     Past Surgical History:  Procedure Laterality Date   PERCUTANEOUS CORONARY STENT INTERVENTION (PCI-S)     RCA    Family History  Problem Relation Age of Onset   Cancer Mother     Social History:  reports that he quit smoking about 42 years ago. His smoking use included cigarettes. He does not have any smokeless tobacco history on  file. He reports that he does not currently use alcohol. He reports that he does not use drugs.  Allergies:  Allergies  Allergen Reactions   Ticagrelor Other (See Comments)    "GI Hemorrhage"   Tetanus Toxoids Other (See Comments)    "Horse serum only"    Coffea Arabica     Other reaction(s): Other (See Comments) DECAF coffee causes vision changes per pt. DECAF coffee causes vision changes per pt.    Niacin     Other reaction(s): Flushing (disorder), Other (See Comments) [facility] but patient can not remember exact reaction [facility] but patient can not remember exact reaction    Niacin And Related Other (See Comments)    Flushing   Niacin Er Other (See Comments)    Flushing   Simvastatin Other (See Comments)    "Muscle pain" Other reaction(s): Muscle pain Other reaction(s): Muscle pain (finding), Muscle pain (finding), Muscle pain (finding), Muscle pain (finding), Other (See Comments)   Tiotropium     Other reaction(s): Retention of urine    Medications: Scheduled:  alum & mag hydroxide-simeth  30 mL Oral BID PC   amLODipine  5 mg Oral Daily   apixaban  5 mg Oral BID   atorvastatin  20 mg Oral Daily   ferrous sulfate  325 mg Oral Q breakfast   ipratropium-albuterol  3 mL Nebulization TID   lisinopril  40 mg  Oral Daily   mometasone-formoterol  2 puff Inhalation BID   pantoprazole  40 mg Oral BID AC   polyethylene glycol  17 g Oral Daily   senna-docusate  1 tablet Oral QHS   sodium chloride  1 g Oral TID WC   sucralfate  1 g Oral TID WC & HS   tamsulosin  0.4 mg Oral QPC supper   umeclidinium bromide  1 puff Inhalation Daily   Continuous:  Results for orders placed or performed during the hospital encounter of 10/31/21 (from the past 24 hour(s))  Basic metabolic panel     Status: Abnormal   Collection Time: 11/05/21  4:50 PM  Result Value Ref Range   Sodium 126 (L) 135 - 145 mmol/L   Potassium 3.7 3.5 - 5.1 mmol/L   Chloride 94 (L) 98 - 111 mmol/L    CO2 25 22 - 32 mmol/L   Glucose, Bld 163 (H) 70 - 99 mg/dL   BUN 21 8 - 23 mg/dL   Creatinine, Ser 1.04 0.61 - 1.24 mg/dL   Calcium 8.3 (L) 8.9 - 10.3 mg/dL   GFR, Estimated >60 >60 mL/min   Anion gap 7 5 - 15  Basic metabolic panel     Status: Abnormal   Collection Time: 11/06/21  4:36 AM  Result Value Ref Range   Sodium 128 (L) 135 - 145 mmol/L   Potassium 3.6 3.5 - 5.1 mmol/L   Chloride 94 (L) 98 - 111 mmol/L   CO2 25 22 - 32 mmol/L   Glucose, Bld 125 (H) 70 - 99 mg/dL   BUN 22 8 - 23 mg/dL   Creatinine, Ser 0.96 0.61 - 1.24 mg/dL   Calcium 8.3 (L) 8.9 - 10.3 mg/dL   GFR, Estimated >60 >60 mL/min   Anion gap 9 5 - 15  CBC     Status: Abnormal   Collection Time: 11/06/21  4:36 AM  Result Value Ref Range   WBC 14.7 (H) 4.0 - 10.5 K/uL   RBC 4.56 4.22 - 5.81 MIL/uL   Hemoglobin 14.7 13.0 - 17.0 g/dL   HCT 40.5 39.0 - 52.0 %   MCV 88.8 80.0 - 100.0 fL   MCH 32.2 26.0 - 34.0 pg   MCHC 36.3 (H) 30.0 - 36.0 g/dL   RDW 12.4 11.5 - 15.5 %   Platelets 247 150 - 400 K/uL   nRBC 0.0 0.0 - 0.2 %     DG CHEST PORT 1 VIEW  Result Date: 11/06/2021 CLINICAL DATA:  Shortness of breath. EXAM: PORTABLE CHEST 1 VIEW COMPARISON:  October 31, 2021 FINDINGS: Tortuosity and calcific atherosclerotic disease of the aorta. Cardiomediastinal silhouette is normal. Mediastinal contours appear intact. There is no evidence of focal airspace consolidation, pleural effusion or pneumothorax. Age indeterminate fractures of the posterior fourth and fifth ribs. Soft tissues are grossly normal. IMPRESSION: 1. No evidence of acute cardiopulmonary disease. 2. Age indeterminate fractures of the right posterior fourth and fifth ribs. Electronically Signed   By: Fidela Salisbury M.D.   On: 11/06/2021 12:16    ROS:  As stated above in the HPI otherwise negative.  Blood pressure (!) 120/52, pulse 88, temperature 98 F (36.7 C), temperature source Axillary, resp. rate 18, height 5\' 10"  (1.778 m), weight 77.9 kg,  SpO2 96 %.    PE: Gen: NAD, Alert HEENT:  [place]/AT, EOMI Neck: Supple, no LAD Lungs: Expiratory wheezing CV: RRR without M/G/R ABD: Soft, epigastric tenderness, +BS Ext: No C/C/E  Assessment/Plan: 1) Singultus.  2) Dysphaiga. 3) History of GERD. 4) Hyponatremia secondary to SIADH.   The patient does have regurgitation.  His oral suction tubing shows some dark material.  With his history of dysphagia and the GERD symptoms, an EGD is warranted.  He does state that he is leaving the hospital tomorrow and he would complete his work up with the [employer].  Plan: 1) EGD with Dr. Loletha Carrow tomorrow. 2) Continue with PPI BID.  Kyesha Balla D 11/06/2021, 12:57 PM

## 2021-11-06 NOTE — Progress Notes (Addendum)
Subjective: Patient complained of severe dyspepsia overnight and was given Maalox without much relief. Patient was seen at bedside during rounds this morning while he was getting a breathing treatment. He continues to complain of severe heartburn today and was seen spitting up black gastric content. He does note that the raisin bran cereal he had in the morning is dark in color. He denies that he has had similar worsening of symptoms before. He mentions that he also had a dark BM since he was seen yesterday. He states that his breathing has been the same since admission. No other complains or concerns at this time.   Per family, pt has a history of frequent hiccups and burps. No history of EGDs in the past. They state that pt has complained of some dyspepsia while laying down on protonix and pepto bismol while living at the independent facility.    Objective:  Vital signs in last 24 hours: Vitals:   11/05/21 2317 11/06/21 0009 11/06/21 0353 11/06/21 0500  BP: (!) 146/62  (!) 170/68   Pulse: 86  97   Resp: 18 (!) 9 18   Temp: 97.9 F (36.6 C)  98.4 F (36.9 C)   TempSrc: Oral  Oral   SpO2: 98%  (!) 89%   Weight:    77.9 kg  Height:       General Physical Exam Constitutional: alert, well-appearing HENT: normocephalic, atraumatic, mucous membranes moist; very HOH Eyes: conjunctiva non-erythematous, extraocular movements intact Neck: supple Cardiovascular: regular rate and rhythm, no m/r/g Pulmonary/Chest: normal work of breathing on room air, Wheezing present diffusely in all lobes, greater in lower lobes. Abdominal: soft, epigastric tenderness to palpation, tympanic, distended, bowel sounds present MSK: normal bulk and tone Neurological: alert & oriented x 3  Skin: warm and dry. Bruises present in bilateral upper extremities (on Eliquis). Skin turgor over anterior upper chest improving but not normal. Psych: normal behavior, normal affect   Assessment/Plan:  Principal  Problem:   Acute encephalopathy Active Problems:   Abdominal distention   Fall   Hyponatremia   COPD exacerbation ([place])  Marcus Martin is a 87 year old male with past medical history of COPD, CAD s/p stents (2016/2017), HTN, PE (2016) and DVT S/p IVC filter placement (2019) on Eliquis, OSA on Cpap, and vocal cord dysfunction who presents to the hospital for acute metabolic encephalopathy 2/2 acute on chronic hyponatremia and COPD exacerbation.   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 Skin turgor is improving but is not back to normal at this time. Pt was given 1L of NS IVF yesterday to address developing hypovolemia and orthostatic vitals obtained to address possible orthostatic hypotension 2/2 fluid restriction. Orthostatic vitals were unremarkable.  -Fluid restriction of 1200 cc (to restrict hypotonic fluid). -Holding NS IVF at this time. -Continue 1g Sodium chloride tablets TID -PT/OT -Monitor Na and K levels on BMP Q8H    Severe epigastric and esophageal pain 2/2 refractory GERD vs PUD vs esophagitis  Pt complained of severe retrosternal chest pain overnight and received Maalox without much relief. He continues to regurgitate dark gastric content noting that he had raisin bran for breakfast in the morning. He is continuing to complain of dark BM which could be evidence of UGIB. H and H is 14.7 and 40.5 at this time. On chart review, he does have a history of using  alendroate and oral iron supplements, concerning for possible pill esophagitis.  On chart review, pt was also noted to have a small hiatal hernia on imaging in 2021. -Administer either Carafate 1 g 30 mins to an hour before meals TID.  -switched to IV Protonix 40 mg BID 30 minutes to one hour before meals. -Give Maalox BID one hour after  meals.   -Obtain CBC to monitor hemoglobin. -GI consulted for refractory GERD, appreciate recs. -Check stool for heme to verify black color is melena vs residual bismuth   COPD exacerbation Diffuse wheezing heard on exam without any changes since yesterday. His wheezing was improving daily until today. Since pt received NS IVF, will obtain a CXR to check for any pulmonary edema. CXR negative. -Day 5/5 of prednisone 40 mg -DuoNebs TID -Continue home LAMA   Hypertension OSA Likely due to OSA-induced hypooxia and hypercapnia related changes in BP. Blood pressure improved to SBP 140s today. He is using his CPAP which shows clinical improvement  -Continue lisinopril to 40 mg -Continue 5 mg amlodipine QD -Continue using CPAP at bedtime   Abdominal distention Right upper quadrant ultrasound did not show any ascites.  Liver and biliary structures were remarkable except for cholelithiasis.  KUB was negative for any obstruction but showed moderate stool burden. Pt continue to have daily BM. -Continue bowel regimen with MiraLAX and Senokot   History of PE and DVT s/p IVC filter -After reviewing risks and benefit of continuing Eliquis with pt's extensive history of DVTs and PEs s/p IVC filter and monitoring his stable H and H, plan is to continue Eliquis.   CAD s/p stents Unremarkable echocardiogram in 2019  LDL 52 in 08/2021 -Continue atorvastatin 20 mg     Diet: Heart Healthy IVF: None,None VTE: Eliquis  Code: DNR PT/OT recs: Home Health, none.  Signature: Claudean Kinds, [facility]  Pager: [phone] 7:06 AM, 11/06/2021   Attestation for Student Documentation:  I personally was present and performed or re-performed the history, physical exam and medical decision-making activities of this service and have verified that the service and findings are accurately documented in the student's note.  Virl Axe, MD 11/06/2021, 12:00 PM

## 2021-11-06 NOTE — H&P (View-Only) (Signed)
CONSULT NOTE FOR [facility]  Reason for Consult: Singultus and GERD Referring Physician: Teaching Service  Geneva Niess HPI: This is an 87 year old male with a PMH of afib, NSTEMI HTN, hyperlipidemia, PE with IVC, and GERD initially admitted for confusion.  Over the course of the hospitalization the patient was treated for SIADH and it was felt to be the source of his N/V and encephalopathy.  During this hospitalization he states that he started to develop singultus and regurgitation.  He states that this was not a problem for him when he was at home and this is a new development for him.  There is a reported dysphagia to both solids and liquids.  Speech path evaluated the patient and there is the suspicion of a structural dysphagia from the cervical osteophytes.  Despite being on PPI BID, Maalox, and sucralfate he continues to complain about severe burning in this epigastric and lower chest region.  The patient does not have a history of undergoing an EGD.  Upon admission his head CT and MRI were negative for any acute abnormalities to explain his singultus.  Today's CXR was negative for any acute abnormalities.  Past Medical History:  Diagnosis Date   Bilateral hearing loss    COPD (chronic obstructive pulmonary disease) (HCC)    DVT (deep venous thrombosis) (HCC)    GERD (gastroesophageal reflux disease)    History of pulmonary embolus (PE)    HLD (hyperlipidemia)    HTN (hypertension)    Melanoma (HCC)    NSTEMI (non-ST elevated myocardial infarction) (HCC)    OSA (obstructive sleep apnea)    Peripheral neuropathy    Squamous cell skin cancer     Past Surgical History:  Procedure Laterality Date   PERCUTANEOUS CORONARY STENT INTERVENTION (PCI-S)     RCA    Family History  Problem Relation Age of Onset   Cancer Mother     Social History:  reports that he quit smoking about 42 years ago. His smoking use included cigarettes. He does not have any smokeless tobacco history on  file. He reports that he does not currently use alcohol. He reports that he does not use drugs.  Allergies:  Allergies  Allergen Reactions   Ticagrelor Other (See Comments)    "GI Hemorrhage"   Tetanus Toxoids Other (See Comments)    "Horse serum only"    Coffea Arabica     Other reaction(s): Other (See Comments) DECAF coffee causes vision changes per pt. DECAF coffee causes vision changes per pt.    Niacin     Other reaction(s): Flushing (disorder), Other (See Comments) [facility] but patient can not remember exact reaction [facility] but patient can not remember exact reaction    Niacin And Related Other (See Comments)    Flushing   Niacin Er Other (See Comments)    Flushing   Simvastatin Other (See Comments)    "Muscle pain" Other reaction(s): Muscle pain Other reaction(s): Muscle pain (finding), Muscle pain (finding), Muscle pain (finding), Muscle pain (finding), Other (See Comments)   Tiotropium     Other reaction(s): Retention of urine    Medications: Scheduled:  alum & mag hydroxide-simeth  30 mL Oral BID PC   amLODipine  5 mg Oral Daily   apixaban  5 mg Oral BID   atorvastatin  20 mg Oral Daily   ferrous sulfate  325 mg Oral Q breakfast   ipratropium-albuterol  3 mL Nebulization TID   lisinopril  40 mg  Oral Daily   mometasone-formoterol  2 puff Inhalation BID   pantoprazole  40 mg Oral BID AC   polyethylene glycol  17 g Oral Daily   senna-docusate  1 tablet Oral QHS   sodium chloride  1 g Oral TID WC   sucralfate  1 g Oral TID WC & HS   tamsulosin  0.4 mg Oral QPC supper   umeclidinium bromide  1 puff Inhalation Daily   Continuous:  Results for orders placed or performed during the hospital encounter of 10/31/21 (from the past 24 hour(s))  Basic metabolic panel     Status: Abnormal   Collection Time: 11/05/21  4:50 PM  Result Value Ref Range   Sodium 126 (L) 135 - 145 mmol/L   Potassium 3.7 3.5 - 5.1 mmol/L   Chloride 94 (L) 98 - 111 mmol/L    CO2 25 22 - 32 mmol/L   Glucose, Bld 163 (H) 70 - 99 mg/dL   BUN 21 8 - 23 mg/dL   Creatinine, Ser 1.04 0.61 - 1.24 mg/dL   Calcium 8.3 (L) 8.9 - 10.3 mg/dL   GFR, Estimated >60 >60 mL/min   Anion gap 7 5 - 15  Basic metabolic panel     Status: Abnormal   Collection Time: 11/06/21  4:36 AM  Result Value Ref Range   Sodium 128 (L) 135 - 145 mmol/L   Potassium 3.6 3.5 - 5.1 mmol/L   Chloride 94 (L) 98 - 111 mmol/L   CO2 25 22 - 32 mmol/L   Glucose, Bld 125 (H) 70 - 99 mg/dL   BUN 22 8 - 23 mg/dL   Creatinine, Ser 0.96 0.61 - 1.24 mg/dL   Calcium 8.3 (L) 8.9 - 10.3 mg/dL   GFR, Estimated >60 >60 mL/min   Anion gap 9 5 - 15  CBC     Status: Abnormal   Collection Time: 11/06/21  4:36 AM  Result Value Ref Range   WBC 14.7 (H) 4.0 - 10.5 K/uL   RBC 4.56 4.22 - 5.81 MIL/uL   Hemoglobin 14.7 13.0 - 17.0 g/dL   HCT 40.5 39.0 - 52.0 %   MCV 88.8 80.0 - 100.0 fL   MCH 32.2 26.0 - 34.0 pg   MCHC 36.3 (H) 30.0 - 36.0 g/dL   RDW 12.4 11.5 - 15.5 %   Platelets 247 150 - 400 K/uL   nRBC 0.0 0.0 - 0.2 %     DG CHEST PORT 1 VIEW  Result Date: 11/06/2021 CLINICAL DATA:  Shortness of breath. EXAM: PORTABLE CHEST 1 VIEW COMPARISON:  October 31, 2021 FINDINGS: Tortuosity and calcific atherosclerotic disease of the aorta. Cardiomediastinal silhouette is normal. Mediastinal contours appear intact. There is no evidence of focal airspace consolidation, pleural effusion or pneumothorax. Age indeterminate fractures of the posterior fourth and fifth ribs. Soft tissues are grossly normal. IMPRESSION: 1. No evidence of acute cardiopulmonary disease. 2. Age indeterminate fractures of the right posterior fourth and fifth ribs. Electronically Signed   By: Fidela Salisbury M.D.   On: 11/06/2021 12:16    ROS:  As stated above in the HPI otherwise negative.  Blood pressure (!) 120/52, pulse 88, temperature 98 F (36.7 C), temperature source Axillary, resp. rate 18, height 5\' 10"  (1.778 m), weight 77.9 kg,  SpO2 96 %.    PE: Gen: NAD, Alert HEENT:  [place]/AT, EOMI Neck: Supple, no LAD Lungs: Expiratory wheezing CV: RRR without M/G/R ABD: Soft, epigastric tenderness, +BS Ext: No C/C/E  Assessment/Plan: 1) Singultus.  2) Dysphaiga. 3) History of GERD. 4) Hyponatremia secondary to SIADH.   The patient does have regurgitation.  His oral suction tubing shows some dark material.  With his history of dysphagia and the GERD symptoms, an EGD is warranted.  He does state that he is leaving the hospital tomorrow and he would complete his work up with the [employer].  Plan: 1) EGD with Dr. Loletha Carrow tomorrow. 2) Continue with PPI BID.  Alyanah Elliott D 11/06/2021, 12:57 PM

## 2021-11-07 ENCOUNTER — Encounter (HOSPITAL_COMMUNITY): Payer: Self-pay | Admitting: Internal Medicine

## 2021-11-07 ENCOUNTER — Inpatient Hospital Stay (HOSPITAL_COMMUNITY): Payer: Medicare Other | Admitting: Certified Registered Nurse Anesthetist

## 2021-11-07 ENCOUNTER — Encounter (HOSPITAL_COMMUNITY)
Admission: EM | Disposition: A | Discharge status: Skilled Nursing Facility | Payer: Self-pay | Source: Skilled Nursing Facility | Attending: Internal Medicine

## 2021-11-07 DIAGNOSIS — K2100 Gastro-esophageal reflux disease with esophagitis, without bleeding: Secondary | ICD-10-CM | POA: Diagnosis not present

## 2021-11-07 DIAGNOSIS — E871 Hypo-osmolality and hyponatremia: Secondary | ICD-10-CM | POA: Diagnosis not present

## 2021-11-07 DIAGNOSIS — G9340 Encephalopathy, unspecified: Secondary | ICD-10-CM | POA: Diagnosis not present

## 2021-11-07 DIAGNOSIS — K920 Hematemesis: Secondary | ICD-10-CM

## 2021-11-07 DIAGNOSIS — K269 Duodenal ulcer, unspecified as acute or chronic, without hemorrhage or perforation: Secondary | ICD-10-CM

## 2021-11-07 DIAGNOSIS — R1314 Dysphagia, pharyngoesophageal phase: Secondary | ICD-10-CM

## 2021-11-07 HISTORY — PX: BIOPSY: SHX5522

## 2021-11-07 HISTORY — PX: ESOPHAGOGASTRODUODENOSCOPY (EGD) WITH PROPOFOL: SHX5813

## 2021-11-07 LAB — RESP PANEL BY RT-PCR (FLU A&B, COVID) ARPGX2
Influenza A by PCR: NEGATIVE
Influenza B by PCR: NEGATIVE
SARS Coronavirus 2 by RT PCR: NEGATIVE

## 2021-11-07 LAB — CBC
HCT: 36.8 % — ABNORMAL LOW (ref 39.0–52.0)
Hemoglobin: 12.8 g/dL — ABNORMAL LOW (ref 13.0–17.0)
MCH: 31.9 pg (ref 26.0–34.0)
MCHC: 34.8 g/dL (ref 30.0–36.0)
MCV: 91.8 fL (ref 80.0–100.0)
Platelets: 222 10*3/uL (ref 150–400)
RBC: 4.01 MIL/uL — ABNORMAL LOW (ref 4.22–5.81)
RDW: 12.5 % (ref 11.5–15.5)
WBC: 13.2 10*3/uL — ABNORMAL HIGH (ref 4.0–10.5)
nRBC: 0 % (ref 0.0–0.2)

## 2021-11-07 LAB — BASIC METABOLIC PANEL
Anion gap: 5 (ref 5–15)
BUN: 31 mg/dL — ABNORMAL HIGH (ref 8–23)
CO2: 24 mmol/L (ref 22–32)
Calcium: 7.7 mg/dL — ABNORMAL LOW (ref 8.9–10.3)
Chloride: 98 mmol/L (ref 98–111)
Creatinine, Ser: 1.09 mg/dL (ref 0.61–1.24)
GFR, Estimated: >60 mL/min (ref >60)
Glucose, Bld: 113 mg/dL — ABNORMAL HIGH (ref 70–99)
Potassium: 3.7 mmol/L (ref 3.5–5.1)
Sodium: 127 mmol/L — ABNORMAL LOW (ref 135–145)

## 2021-11-07 LAB — GLUCOSE, CAPILLARY: Glucose-Capillary: 91 mg/dL (ref 70–99)

## 2021-11-07 SURGERY — ESOPHAGOGASTRODUODENOSCOPY (EGD) WITH PROPOFOL
Anesthesia: Monitor Anesthesia Care

## 2021-11-07 MED ORDER — SODIUM CHLORIDE 0.9 % IV SOLN: INTRAVENOUS | Status: DC

## 2021-11-07 MED ORDER — APIXABAN 5 MG PO TABS
5.0000 mg | ORAL_TABLET | Freq: Two times a day (BID) | ORAL | Status: DC
  Administered 2021-11-08: 5 mg via ORAL
  Filled 2021-11-07: qty 1

## 2021-11-07 MED ORDER — FUROSEMIDE 20 MG PO TABS
20.0000 mg | ORAL_TABLET | Freq: Two times a day (BID) | ORAL | Status: DC
  Administered 2021-11-07 – 2021-11-08 (×3): 20 mg via ORAL
  Filled 2021-11-07 (×3): qty 1

## 2021-11-07 MED ORDER — LIDOCAINE HCL (PF) 2 % IJ SOLN
INTRAMUSCULAR | Status: DC | PRN
  Administered 2021-11-07: 80 mg via INTRADERMAL

## 2021-11-07 MED ORDER — PROPOFOL 500 MG/50ML IV EMUL
INTRAVENOUS | Status: DC | PRN
Start: 2021-11-07 — End: 2021-11-07
  Administered 2021-11-07: 125 ug/kg/min via INTRAVENOUS

## 2021-11-07 MED ORDER — PROPOFOL 10 MG/ML IV BOLUS
INTRAVENOUS | Status: DC | PRN
  Administered 2021-11-07: 35 mg via INTRAVENOUS

## 2021-11-07 MED ORDER — ALBUTEROL SULFATE (2.5 MG/3ML) 0.083% IN NEBU
INHALATION_SOLUTION | RESPIRATORY_TRACT | Status: AC
  Filled 2021-11-07: qty 3

## 2021-11-07 SURGICAL SUPPLY — 15 items

## 2021-11-07 NOTE — Anesthesia Procedure Notes (Signed)
Procedure Name: MAC Date/Time: 11/07/2021 10:56 AM Performed by: Janace Litten, CRNA Pre-anesthesia Checklist: Patient identified, Emergency Drugs available, Suction available and Patient being monitored Oxygen Delivery Method: Simple face mask

## 2021-11-07 NOTE — Progress Notes (Signed)
SLP Cancellation Note  Patient Details Name: Marcus Martin MRN: [id number] DOB: [birth year]   Cancelled treatment:       Reason Eval/Treat Not Completed: Medical issues which prohibited therapy;Other (comment) (Patient currently NPO and to be getting EGD today. SLP to follow up after EGD results to determine need for objective swallow study (MBS).)  Sonia Baller, MA, CCC-SLP Speech Therapy

## 2021-11-07 NOTE — Progress Notes (Signed)
Physical Therapy Treatment Patient Details Name: Marcus Martin MRN: [id number] DOB: 1933/03/21 Today's Date: 11/07/2021   History of Present Illness Pt is an 87 y/o male, presenting 11/28 from [facility] for confusion and weakness.  PMHX: Diverticulitis (self-reported), NSTEMI s/p PCI, afib, COPD, HTN, PE with IVC filter, peripheral neuropathy, bil hearing loss.    PT Comments    Pt received in supine (HOB elevated post-procedure), agreeable to therapy session and with good participation and tolerance for short gait trial and transfer training in room. Emphasis on safe hand placement with transfers, use of RW, activity pacing and importance of frequent positional changes/getting out of bed during the day. Pt continues to benefit from PT services to progress toward functional mobility goals.   Recommendations for follow up therapy are one component of a multi-disciplinary discharge planning process, led by the attending physician.  Recommendations may be updated based on patient status, additional functional criteria and insurance authorization.  Follow Up Recommendations  Skilled nursing-short term rehab (<3 hours/day)     Assistance Recommended at Discharge Frequent or constant Supervision/Assistance  Equipment Recommendations  Other (comment) (RW if he does not have one at facility)    Recommendations for Other Services Speech consult     Precautions / Restrictions Precautions Precautions: Fall Precaution Comments: HOB >45 degrees post-endoscopy Restrictions Weight Bearing Restrictions: No     Mobility  Bed Mobility Overal bed mobility: Needs Assistance Bed Mobility: Supine to Sit     Supine to sit: Min assist     General bed mobility comments: HOB 48* when initiating (post-procedure needs HOB elevated)    Transfers Overall transfer level: Needs assistance Equipment used: Rolling walker (2 wheels) Transfers: Sit to/from Stand Sit to Stand: Min assist            General transfer comment: from EOB>recliner, needs some tactile cues to locate arm rests    Ambulation/Gait Ambulation/Gait assistance: Min assist Gait Distance (Feet): 30 Feet Assistive device: Rolling walker (2 wheels) Gait Pattern/deviations: Step-through pattern;Decreased stride length Gait velocity: grossly <0.2 m/s     General Gait Details: downward gaze, cues for forward head/upright posture and fair use of RW, pt more steady using RW today than with cane previous session, pt c/o fatigue after short distance in room      Balance Overall balance assessment: Needs assistance Sitting-balance support: Single extremity supported Sitting balance-Leahy Scale: Fair     Standing balance support: Single extremity supported;During functional activity Standing balance-Leahy Scale: Poor Standing balance comment: BUE support for dynamic tasks            Cognition Arousal/Alertness: Awake/alert Behavior During Therapy: WFL for tasks assessed/performed Overall Cognitive Status: Impaired/Different from baseline Area of Impairment: Orientation;Memory;Awareness;Problem solving;Safety/judgement         Orientation Level: Time   Memory: Decreased short-term memory;Decreased recall of precautions   Safety/Judgement: Decreased awareness of safety;Decreased awareness of deficits Awareness: Intellectual Problem Solving: Slow processing;Requires verbal cues General Comments: HoH, pt needs some cues for improved technique with functional mobility tasks and for safety; pt still reporting pain with swallowing and hiccuping post-procedure        Exercises      General Comments General comments (skin integrity, edema, etc.): HR WFL per tele monitor      Pertinent Vitals/Pain Pain Assessment: Faces Faces Pain Scale: Hurts even more Pain Location: throat after upper endoscopy this morning, pt hicciuping frequently while trying to eat Pain Descriptors / Indicators:  Discomfort;Sore Pain Intervention(s): Monitored during session;Repositioned  PT Goals (current goals can now be found in the care plan section) Acute Rehab PT Goals Patient Stated Goal: back home PT Goal Formulation: With patient Time For Goal Achievement: 11/08/21 Progress towards PT goals: Progressing toward goals    Frequency    Min 3X/week      PT Plan Current plan remains appropriate       AM-PAC PT "6 Clicks" Mobility   Outcome Measure  Help needed turning from your back to your side while in a flat bed without using bedrails?: A Little Help needed moving from lying on your back to sitting on the side of a flat bed without using bedrails?: A Little Help needed moving to and from a bed to a chair (including a wheelchair)?: A Lot (mod cues) Help needed standing up from a chair using your arms (e.g., wheelchair or bedside chair)?: A Lot Help needed to walk in hospital room?: A Lot Help needed climbing 3-5 steps with a railing? : A Lot 6 Click Score: 14    End of Session Equipment Utilized During Treatment: Gait belt Activity Tolerance: Patient tolerated treatment well Patient left: in chair;with call bell/phone within reach;with chair alarm set Nurse Communication: Mobility status;Other (comment) (continued pain post-procedure) PT Visit Diagnosis: Other abnormalities of gait and mobility (R26.89)     Time: 0932-6712 PT Time Calculation (min) (ACUTE ONLY): 15 min  Charges:  $Gait Training: 8-22 mins                     Stefanee Mckell P., PTA Acute Rehabilitation Services Pager: [phone] Office: [place] 11/07/2021, 4:11 PM

## 2021-11-07 NOTE — TOC Progression Note (Signed)
Transition of Care [facility]) - Progression Note    Patient Details  Name: Marcus Martin MRN: [id number] Date of Birth: January 20, 1933  Transition of Care [facility]) CM/SW Contact  Pollie Friar, RN Phone Number: 11/07/2021, 2:57 PM  Clinical Narrative:    Patient has insurance approval starting tomorrow for SNF rehab at [facility]). MD and daughter in law updated. CM spoke to Talbert Forest at [facility] and they will have a bed available. Covid ordered for am discharge.  TOC following.   Expected Discharge Plan: [place] Barriers to Discharge: Continued Medical Work up  Expected Discharge Plan and Services Expected Discharge Plan: [place] In-house Referral: Clinical Social Work Discharge Planning Services: CM Consult Post Acute Care Choice: [place] Living arrangements for the past 2 months: Apartment                                       Social Determinants of Health (SDOH) Interventions    Readmission Risk Interventions No flowsheet data found.

## 2021-11-07 NOTE — Anesthesia Preprocedure Evaluation (Addendum)
Anesthesia Evaluation  Patient identified by MRN, date of birth, ID band Patient awake    Reviewed: Allergy & Precautions, NPO status   Airway Mallampati: II  TM Distance: >3 FB     Dental   Pulmonary sleep apnea , COPD, former smoker,  Hx noted Dr. Nyoka Cowden    + decreased breath sounds      Cardiovascular hypertension, + Past MI  + dysrhythmias Atrial Fibrillation  Rhythm:Regular Rate:Normal     Neuro/Psych  Neuromuscular disease    GI/Hepatic Neg liver ROS, GERD  ,  Endo/Other  negative endocrine ROS  Renal/GU negative Renal ROS     Musculoskeletal   Abdominal   Peds  Hematology   Anesthesia Other Findings   Reproductive/Obstetrics                            Anesthesia Physical Anesthesia Plan  ASA: 3  Anesthesia Plan: MAC   Post-op Pain Management:    Induction: Intravenous  PONV Risk Score and Plan: Treatment may vary due to age or medical condition and Propofol infusion  Airway Management Planned:   Additional Equipment:   Intra-op Plan:   Post-operative Plan:   Informed Consent: I have reviewed the patients History and Physical, chart, labs and discussed the procedure including the risks, benefits and alternatives for the proposed anesthesia with the patient or authorized representative who has indicated his/her understanding and acceptance.     Dental advisory given  Plan Discussed with: Anesthesiologist and CRNA  Anesthesia Plan Comments:         Anesthesia Quick Evaluation

## 2021-11-07 NOTE — Interval H&P Note (Signed)
History and Physical Interval Note:  11/07/2021 10:43 AM  Marcus Martin  has presented today for surgery, with the diagnosis of GERD and dysphagia.  The various methods of treatment have been discussed with the patient and family. After consideration of risks, benefits and other options for treatment, the patient has consented to  Procedure(s): ESOPHAGOGASTRODUODENOSCOPY (EGD) WITH PROPOFOL (N/A) as a surgical intervention.  The patient's history has been reviewed, patient examined, no change in status, stable for surgery.  I have reviewed the patient's chart and labs.  Questions were answered to the patient's satisfaction.    Signout received from Dr. Benson Norway, chart reviewed and patient examined. Elderly man with multiple medical issues and debility admitted with change in mental status.  He has recent onset hiccups, dysphagia and reportedly had coffee-ground material in respiratory secretions that were suctioned.  Patient has normal oxygen saturation on room air, though he has a productive cough and was just given a respiratory treatment with albuterol nebulizer. Plan is for upper endoscopy, patient is noted to be DNR, which will be temporarily reversed for the procedure.  He also request that I contact his son Marcus Martin by phone with results of procedure.  Marcus Martin III

## 2021-11-07 NOTE — Op Note (Addendum)
[facility] Patient Name: Keyonte Cookston Procedure Date : 11/07/2021 MRN: [id number] Attending MD: Estill Cotta. Loletha Carrow , MD Date of Birth: Mar 09, 1933 CSN: [id number] Age: 87 Admit Type: Inpatient Procedure:                Upper GI endoscopy Indications:              Esophageal dysphagia, Heartburn, Coffee-ground                            emesis Providers:                Mallie Mussel L. Loletha Carrow, MD, Brooke Person, Lodema Hong                            Technician, Technician, Cletis Athens, Merchant navy officer Referring MD:             Internal Medicine Teaching Service Medicines:                Monitored Anesthesia Care Complications:            No immediate complications. Estimated Blood Loss:     Estimated blood loss was minimal. Procedure:                Pre-Anesthesia Assessment:                           - Prior to the procedure, a History and Physical                            was performed, and patient medications and                            allergies were reviewed. The patient's tolerance of                            previous anesthesia was also reviewed. The risks                            and benefits of the procedure and the sedation                            options and risks were discussed with the patient.                            All questions were answered, and informed consent                            was obtained. Prior Anticoagulants: The patient has                            taken Eliquis (apixaban), last dose was 1 day prior                            to procedure. ASA Grade Assessment: III - A patient  with severe systemic disease. After reviewing the                            risks and benefits, the patient was deemed in                            satisfactory condition to undergo the procedure.                           After obtaining informed consent, the endoscope was                            passed under direct vision.  Throughout the                            procedure, the patient's blood pressure, pulse, and                            oxygen saturations were monitored continuously. The                            [id number]-[id number] ([id number]) Olympus endoscope was introduced                            through the mouth, and advanced to the second part                            of duodenum. The upper GI endoscopy was                            accomplished without difficulty. The patient                            tolerated the procedure. Scope In: Scope Out: Findings:      LA Grade D (one or more mucosal breaks involving at least 75% of       esophageal circumference) esophagitis with no bleeding was found in the       entire esophagus. Scope able to pass without resistance.      A 1-2 cm hiatal hernia was present.      Multiple erosions with no bleeding and no stigmata of recent bleeding       were found in the cardia (Cameron erosions within hernia sac) and       focally on the greater curvature of the proximal gastric body. Several       biopsies were obtained in the gastric body and in the gastric antrum       with cold forceps for histology.      The cardia and gastric fundus were otherwise normal on retroflexion.      One non-bleeding superficial duodenal ulcer with no stigmata of bleeding       was found in the duodenal bulb. The lesion was 5 mm in largest dimension.      The exam of the duodenum was otherwise normal. Impression:               - LA Grade D  reflux esophagitis with no bleeding.                           - A 1-2 cm hiatal hernia.                           - Erosive gastropathy with no bleeding and no                            stigmata of recent bleeding.                           - Non-bleeding duodenal ulcer with no stigmata of                            bleeding.                           - Several biopsies were obtained in the gastric                            body and in the  gastric antrum.                           Small volume bleeding came from severe esophagitis. Recommendation:           - Return patient to hospital ward for ongoing care.                           - Soft diet for at least 2 weeks, then advance to                            regular diet as tolerated.                           - Continue present medications. (including carafate                            and twice daily pantoprazole 40 mg. Convert to oral                            PPI when patient able.                           - Resume Eliquis (apixaban) at prior dose tomorrow.                           - Await pathology results.                           - Will need outpatient GI follow-up for GERD                            management.                           -  Head of bed 45 degrees at all times.                           - The findings and recommendations were discussed                            with the patient's family. (son Daren by phone) Procedure Code(s):        --- Professional ---                           [phone], Esophagogastroduodenoscopy, flexible,                            transoral; with biopsy, single or multiple Diagnosis Code(s):        --- Professional ---                           K21.00, Gastro-esophageal reflux disease with                            esophagitis, without bleeding                           K44.9, Diaphragmatic hernia without obstruction or                            gangrene                           K31.89, Other diseases of stomach and duodenum                           K26.9, Duodenal ulcer, unspecified as acute or                            chronic, without hemorrhage or perforation                           R13.14, Dysphagia, pharyngoesophageal phase                           R12, Heartburn                           K92.0, Hematemesis CPT copyright 2019 American Medical Association. All rights reserved. The codes documented in this  report are preliminary and upon coder review may  be revised to meet current compliance requirements. Corlette Ciano L. Loletha Carrow, MD 11/07/2021 11:22:52 AM This report has been signed electronically. Number of Addenda: 0

## 2021-11-07 NOTE — Progress Notes (Signed)
Pt has returned back from procedure.

## 2021-11-07 NOTE — Progress Notes (Signed)
Pt went down for procedure

## 2021-11-07 NOTE — Progress Notes (Signed)
   Subjective:  No acute overnight events.  Doing well today, still having heartburn. Tolerated EGD well. Having lunch (soft foods) with a little difficulty, but states it is tolerable when drinking fluids with food.   Discussed EGD findings. Would like to go to SNF today, discussed that insurance authorization is pending. Agreeable to waiting until tomorrow.  Objective:  Vital signs in last 24 hours: Vitals:   11/07/21 1120 11/07/21 1128 11/07/21 1147 11/07/21 1546  BP: (!) 113/48 (!) 110/42 (!) 150/42 (!) 135/50  Pulse: 84 85 79 91  Resp: 20 (!) 22 18 16   Temp:   98 F (36.7 C) 97.8 F (36.6 C)  TempSrc:   Oral Oral  SpO2: 94% 97% 99% 95%  Weight:      Height:       Physical Exam: General: pleasant elderly male, sitting up in bed, NAD. HENT: hard of hearing. CV: normal rate and regular rhythm, no m/r/g. Pulm: minimal wheezing on exam, normal work of breathing. Abdomen: soft, nondistended, nontender, normoactive bowel sounds. Skin: warm and dry, chronic bruising noted over bilateral arms. Neuro: AAOx4, no focal deficits.  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 Sodium levels 127 today, asymptomatic. Has chronic loose skin, so skin turgor examination likely not accurate. Orthostatics were unremarkable yesterday. Will restart  lasix 20mg  BID in attempt to bring sodium levels closer to normal. -continue fluid restriction and salt tabs TID -restart lasix 20mg  BID -trend BMP    Severe epigastric and esophageal pain 2/2 refractory GERD vs PUD vs esophagitis  Underwent EGD this morning with Dr. Loletha Carrow. EGD showing LA Grade D esophagitis with no signs of bleeding, a 1-2cm hiatal hernia, erosive gastropathy with no signs of bleeding, and a non-bleeding duodenal ulcer. GI recs including soft diet x2 weeks, carafate TID, protonix 40mg  BID. GI to f/u pathology results. Spoke with [facility], patient will be scheduled for outpatient GI f/u there (unsure date).  -continue carafate TID -transition to PO protonix 40mg  BID starting tomorrow -continue maalox BID through today -soft diet x2 weeks, then regular diet -f/u pathology results -outpatient GI f/u at [facility]  COPD exacerbation Wheezing improved on exam today. Completed 5-day course of steroids. -continue home inhaler therapies   Hypertension OSA Likely due to OSA-induced hypooxia and hypercapnia related changes in BP. -continue amlodipine and lisinopril -CPAP qhs   History of PE and DVT s/p IVC filter Per GI, okay to resume Eliquis tomorrow. -resume eliquis 5mg  BID tomorrow    Anticipated discharge location: SNF Barriers to discharge: continued medical management Disposition: anticipated discharge tomorrow  Signature: Virl Axe, MD [facility], PGY-2 Pager: [phone] 3:48 PM, 11/07/2021

## 2021-11-07 NOTE — Plan of Care (Signed)
Pt has c/o throat hurting. MD's are aware after paging. EGD completed. Nasal swab completed. MD called as well to confirm. Pt has been in the chair to eat. Meds have been crushed to ease the pain to swallow. The meds that could not be crushed he was able to swallow with no problems. The liquid meds seems to have him cough more but told him to take his time. No other concerns.  Problem: Education: Goal: Knowledge of General Education information will improve Description: Including pain rating scale, medication(s)/side effects and non-pharmacologic comfort measures Outcome: Progressing   Problem: Health Behavior/Discharge Planning: Goal: Ability to manage health-related needs will improve Outcome: Progressing   Problem: Clinical Measurements: Goal: Ability to maintain clinical measurements within normal limits will improve Outcome: Progressing Goal: Will remain free from infection Outcome: Progressing Goal: Diagnostic test results will improve Outcome: Progressing Goal: Respiratory complications will improve Outcome: Progressing   Problem: Activity: Goal: Risk for activity intolerance will decrease Outcome: Progressing   Problem: Nutrition: Goal: Adequate nutrition will be maintained Outcome: Progressing   Problem: Coping: Goal: Level of anxiety will decrease Outcome: Progressing   Problem: Elimination: Goal: Will not experience complications related to bowel motility Outcome: Progressing Goal: Will not experience complications related to urinary retention Outcome: Progressing   Problem: Pain Managment: Goal: General experience of comfort will improve Outcome: Progressing   Problem: Safety: Goal: Ability to remain free from injury will improve Outcome: Progressing   Problem: Skin Integrity: Goal: Risk for impaired skin integrity will decrease Outcome: Progressing

## 2021-11-07 NOTE — Anesthesia Postprocedure Evaluation (Signed)
Anesthesia Post Note  Patient: Marcus Martin  Procedure(s) Performed: ESOPHAGOGASTRODUODENOSCOPY (EGD) WITH PROPOFOL BIOPSY     Patient location during evaluation: Endoscopy Anesthesia Type: MAC Level of consciousness: awake Pain management: pain level controlled Vital Signs Assessment: post-procedure vital signs reviewed and stable Respiratory status: spontaneous breathing Cardiovascular status: stable Postop Assessment: no apparent nausea or vomiting Anesthetic complications: no   No notable events documented.  Last Vitals:  Vitals:   11/07/21 1120 11/07/21 1128  BP: (!) 113/48 (!) 110/42  Pulse: 84 85  Resp: 20 (!) 22  Temp:    SpO2: 94% 97%    Last Pain:  Vitals:   11/07/21 1128  TempSrc:   PainSc: 0-No pain                 Wasyl Dornfeld

## 2021-11-07 NOTE — Transfer of Care (Signed)
Immediate Anesthesia Transfer of Care Note  Patient: Marcus Martin  Procedure(s) Performed: ESOPHAGOGASTRODUODENOSCOPY (EGD) WITH PROPOFOL BIOPSY  Patient Location: PACU and Endoscopy Unit  Anesthesia Type:MAC  Level of Consciousness: drowsy and responds to stimulation  Airway & Oxygen Therapy: Patient Spontanous Breathing and Patient connected to face mask oxygen  Post-op Assessment: Report given to RN and Post -op Vital signs reviewed and stable  Post vital signs: Reviewed and stable  Last Vitals:  Vitals Value Taken Time  BP    Temp    Pulse    Resp    SpO2      Last Pain:  Vitals:   11/07/21 1015  TempSrc: Temporal  PainSc: 0-No pain      Patients Stated Pain Goal: 0 (46/50/35 [id number])  Complications: No notable events documented.

## 2021-11-08 DIAGNOSIS — E222 Syndrome of inappropriate secretion of antidiuretic hormone: Secondary | ICD-10-CM | POA: Diagnosis not present

## 2021-11-08 DIAGNOSIS — K2100 Gastro-esophageal reflux disease with esophagitis, without bleeding: Secondary | ICD-10-CM | POA: Diagnosis not present

## 2021-11-08 DIAGNOSIS — G9340 Encephalopathy, unspecified: Secondary | ICD-10-CM | POA: Diagnosis not present

## 2021-11-08 DIAGNOSIS — E871 Hypo-osmolality and hyponatremia: Secondary | ICD-10-CM | POA: Diagnosis not present

## 2021-11-08 LAB — BASIC METABOLIC PANEL
Anion gap: 8 (ref 5–15)
BUN: 31 mg/dL — ABNORMAL HIGH (ref 8–23)
CO2: 23 mmol/L (ref 22–32)
Calcium: 7.7 mg/dL — ABNORMAL LOW (ref 8.9–10.3)
Chloride: 97 mmol/L — ABNORMAL LOW (ref 98–111)
Creatinine, Ser: 1.15 mg/dL (ref 0.61–1.24)
GFR, Estimated: >60 mL/min (ref >60)
Glucose, Bld: 95 mg/dL (ref 70–99)
Potassium: 3.5 mmol/L (ref 3.5–5.1)
Sodium: 128 mmol/L — ABNORMAL LOW (ref 135–145)

## 2021-11-08 LAB — SURGICAL PATHOLOGY

## 2021-11-08 LAB — GLUCOSE, CAPILLARY: Glucose-Capillary: 101 mg/dL — ABNORMAL HIGH (ref 70–99)

## 2021-11-08 MED ORDER — AMLODIPINE BESYLATE 5 MG PO TABS: 5.0000 mg | ORAL_TABLET | Freq: Every day | ORAL | Status: DC

## 2021-11-08 MED ORDER — SODIUM CHLORIDE 1 G PO TABS: 1.0000 g | ORAL_TABLET | Freq: Three times a day (TID) | ORAL | Status: AC

## 2021-11-08 MED ORDER — FUROSEMIDE 20 MG PO TABS: 20.0000 mg | ORAL_TABLET | Freq: Two times a day (BID) | ORAL | Status: DC

## 2021-11-08 MED ORDER — SUCRALFATE 1 GM/10ML PO SUSP: 1.0000 g | Freq: Three times a day (TID) | ORAL | 0 refills | Status: DC

## 2021-11-08 MED ORDER — PANTOPRAZOLE SODIUM 40 MG PO TBEC: 40.0000 mg | DELAYED_RELEASE_TABLET | Freq: Two times a day (BID) | ORAL | Status: DC

## 2021-11-08 MED ORDER — PANTOPRAZOLE SODIUM 40 MG PO TBEC
40.0000 mg | DELAYED_RELEASE_TABLET | Freq: Two times a day (BID) | ORAL | Status: DC
  Administered 2021-11-08 (×2): 40 mg via ORAL
  Filled 2021-11-08 (×2): qty 1

## 2021-11-08 MED ORDER — LISINOPRIL 40 MG PO TABS: 40.0000 mg | ORAL_TABLET | Freq: Every day | ORAL | Status: DC

## 2021-11-08 NOTE — Progress Notes (Signed)
Speech Language Pathology Treatment: Dysphagia  Patient Details Name: Marcus Martin MRN: [id number] DOB: 03-Jun-1933 Today's Date: 11/08/2021 Time: 1120-1140 SLP Time Calculation (min) (ACUTE ONLY): 20 min  Assessment / Plan / Recommendation Clinical Impression  Pt was seen at bedside s/p EGD 11/07/21. No family present at this time. Pt reports continued esophageal discomfort following EGD, but indicates no difficulty swallowing. Pt was observed drinking water, and no overt s/s aspiration were noted. Pt was receptive to education regarding strategies and behaviors for management of esophageal issues. This information was also provided in written form for pt. Pt indicated he was being discharged today, and did not want to pursue MBS at this time. He was encouraged to notify his PCP if difficulties arose, or if he decides he does want to complete MBS in the future. Pt was provided with opportunity to ask questions. ST will sign off at this time. Please reconsult if needs arise.   HPI HPI: Pt is an 87 y/o male, presenting 11/28 from [facility] for confusion and weakness.  PMHX: Diverticulitis (self-reported), NSTEMI s/p PCI, afib, COPD, HTN, PE with IVC filter, peripheral neuropathy, bil hearing loss, h/o ACDF. Current cervical spinal CT showed cervical osteophytes.  Patient reported difficulty swallowing, prompting this BSE.      SLP Plan  Discharge SLP treatment due to - pt declined MBS at this time.     Recommendations for follow up therapy are one component of a multi-disciplinary discharge planning process, led by the attending physician.  Recommendations may be updated based on patient status, additional functional criteria and insurance authorization.    Recommendations  Diet recommendations: Dysphagia 3 (mechanical soft);Thin liquid Liquids provided via: Cup;Straw Medication Administration: Whole meds with liquid Supervision: Patient able to self feed;Intermittent supervision  to cue for compensatory strategies Compensations: Minimize environmental distractions;Slow rate;Small sips/bites Postural Changes and/or Swallow Maneuvers: Seated upright 90 degrees;Upright 30-60 min after meal                Oral Care Recommendations: Oral care BID Follow Up Recommendations: Other (comment) (pt was encouraged to notify PCP if he changes his mind about having MBS) Assistance recommended at discharge: Intermittent Supervision/Assistance SLP Visit Diagnosis: Dysphagia, unspecified (R13.10) Plan: Discharge SLP treatment due to (comment)       GO               Jermel Artley B. Quentin Ore, [facility], [place] Speech Language Pathologist Office: [phone]  Shonna Chock  11/08/2021, 12:33 PM

## 2021-11-08 NOTE — Discharge Summary (Signed)
Name: Marcus Martin MRN: [id number] DOB: 09-Jun-1933 87 y.o. PCP: Curly Rim, MD  Date of Admission: 10/31/2021 12:29 PM Date of Discharge: 11/08/2021 Attending Physician: Lucious Groves, DO  Discharge Diagnosis: 1. Acute metabolic encephalopathy - resolved 2. 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 Severe reflux esophagitis 4. COPD exacerbation - resolved 5. HTN 6. OSA 7. Hx of PE/DVT s/p IVC filter, on eliquis  Discharge Medications: Allergies as of 11/08/2021       Reactions   Ticagrelor Other (See Comments)   "GI Hemorrhage"   Tetanus Toxoids Other (See Comments)   "Horse serum only"   Coffea Arabica    Other reaction(s): Other (See Comments) DECAF coffee causes vision changes per pt. DECAF coffee causes vision changes per pt.   Niacin    Other reaction(s): Flushing (disorder), Other (See Comments) [facility] but [facility] can not remember exact reaction [facility] but patient can not remember exact reaction   Niacin And Related Other (See Comments)   Flushing   Niacin Er Other (See Comments)   Flushing   Simvastatin Other (See Comments)   "Muscle pain" Other reaction(s): Muscle pain Other reaction(s): Muscle pain (finding), Muscle pain (finding), Muscle pain (finding), Muscle pain (finding), Other (See Comments)   Tiotropium    Other reaction(s): Retention of urine        Medication List     STOP taking these medications    alendronate 70 MG tablet Commonly known as: FOSAMAX       TAKE these medications    acetaminophen 500 MG tablet Commonly known as: TYLENOL Take 500 mg by mouth 3 (three) times daily as needed for mild pain, headache or fever.   albuterol 108 (90 Base) MCG/ACT inhaler Commonly known as: VENTOLIN HFA Inhale 2 puffs into the lungs every 6 (six) hours as needed for wheezing or shortness of breath.   amLODipine 5 MG tablet Commonly known as: NORVASC Take 1 tablet (5 mg total) by mouth  daily. Start taking on: November 09, 2021   apixaban 5 MG Tabs tablet Commonly known as: ELIQUIS Take 5 mg by mouth 2 (two) times daily.   atorvastatin 20 MG tablet Commonly known as: LIPITOR Take 20 mg by mouth every evening.   B COMPLEX PO Take 1 tablet by mouth daily.   CALCIUM 500/VITAMIN D PO Take 1 tablet by mouth daily.   Ensure Take 237 mLs by mouth in the morning and at bedtime.   ferrous sulfate 325 (65 FE) MG tablet Take 325 mg by mouth every Monday, Wednesday, and Friday.   fluticasone-salmeterol 250-50 MCG/ACT Aepb Commonly known as: ADVAIR Inhale 1 puff into the lungs in the morning and at bedtime.   furosemide 20 MG tablet Commonly known as: LASIX Take 1 tablet (20 mg total) by mouth 2 (two) times daily.   gabapentin 300 MG capsule Commonly known as: NEURONTIN Take 600 mg by mouth at bedtime. Also has 400 mg dosage for daytime   gabapentin 400 MG capsule Commonly known as: NEURONTIN Take 400 mg by mouth in the morning and at bedtime. Also has 300 mg dose for bedtime   latanoprost 0.005 % ophthalmic solution Commonly known as: XALATAN Place 1 drop into both eyes at bedtime.   lisinopril 40 MG tablet Commonly known as: ZESTRIL Take 1 tablet (40 mg total) by mouth daily. Start taking on: November 09, 2021 What changed:  medication strength how much to take additional instructions   LUTEIN PO  Take 1 capsule by mouth daily.   multivitamin tablet Take 1 tablet by mouth daily.   pantoprazole 40 MG tablet Commonly known as: PROTONIX Take 1 tablet (40 mg total) by mouth 2 (two) times daily before a meal. What changed:  when to take this reasons to take this   polyvinyl alcohol 1.4 % ophthalmic solution Commonly known as: LIQUIFILM TEARS Place 1 drop into both eyes daily as needed for dry eyes.   PROBIOTIC PO Take 1 capsule by mouth daily.   silver sulfADIAZINE 1 % cream Commonly known as: SILVADENE Apply 1 application topically in the  morning and at bedtime.   Simbrinza 1-0.2 % Susp Generic drug: Brinzolamide-Brimonidine Place 1 drop into both eyes in the morning and at bedtime.   sodium chloride 1 g tablet Take 1 tablet (1 g total) by mouth 3 (three) times daily with meals.   sucralfate 1 GM/10ML suspension Commonly known as: CARAFATE Take 10 mLs (1 g total) by mouth 4 (four) times daily -  with meals and at bedtime.   tamsulosin 0.4 MG Caps capsule Commonly known as: FLOMAX Take 0.4 mg by mouth at bedtime. Hold for SBP <110   triamcinolone 0.025 % cream Commonly known as: KENALOG Apply 1 application topically 2 (two) times daily.   Triple Antibiotic Oint Place 1 application onto the skin 2 (two) times daily as needed (minor wounds/cuts).   Tudorza Pressair 400 MCG/ACT Aepb Generic drug: Aclidinium Bromide Inhale 1 puff into the lungs in the morning and at bedtime.   vitamin C 500 MG tablet Commonly known as: ASCORBIC ACID Take 500 mg by mouth daily.   Vitamin D3 25 MCG (1000 UT) Caps Take 1,000 Units by mouth daily.        Disposition and follow-up:   Marcus Martin was discharged from [facility] in Stable condition.  At the hospital follow up visit please address:  1.  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 Encephalopathy has resolved, back to baseline. Sodium levels also back to baseline (baseline 126-128), currently 128. Will discharge with salt tablets TID and lasix 75m BID. Please repeat BMP in 1 week and discontinue lasix if necessary.  2. Severe reflux esophagitis. EGD showing LA Grade D esophagitis with no signs of bleeding, a 1-2cm hiatal hernia, erosive gastropathy with no signs of bleeding, and a non-bleeding duodenal ulcer. Discharge with soft diet x2 weeks, carafate TID, protonix 452mBID. F/u with GI as outpatient in 2 weeks.  3. COPD exacerbation. Resolved, discharging with home inhalers.  4. HTN.  Was  on lisinopril 2065maily on admission, but BP not well controlled while here. Increased lisinopril to 71m7mily and added amlodipine 5mg 85mly. BP well controlled with these and will discharge with both of these. Can titrate as necessary.   5. Hx of DVT/PE s/p IVC filter. Resumed home eliquis at discharge.  2.  Labs / imaging needed at time of follow-up: BMP in 1 week  3.  Pending labs/ test needing follow-up: Pathology results  Follow-up Appointments:   Hospital Course by problem list: 1. 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  - Presented with acute metabolic encephalopathy. CT head and MRI brain on admission with no acute findings. Urinalysis unremarkable aside from small amount of ketones. CXR with no focal infiltrates. CMP showing hyponatremia with sodium of 122 (baseline 126-128). Per chart review, hyponatremia previously worked up and attributed to SiADHBB&T Corporationne osm  and urine sodium both elevated. Patient was having wheezing and SHOB, so initially concerned for COPD exacerbation playing role in encephalopathy, started on duonebs, prednisone, azithromycin. Started on fluid restriction and NS, but sodium levels remained low (lowest value of 117-118 during course). COPD exacerbation improved. Started on salt tablets TID and lasix 47m BID, with resultant increase in sodium up to 128. Will discharge with salt tabs and lasix as he still has some free water excess. Repeat BMP in 1 week. Can discontinue lasix if he has hypotension or orthostatic hypotension. [facility]   2. Severe reflux esophagitis.  - During hospital course, patient was having severe heartburn leading to constant hiccups/belching. No improvement with protonix, carafate, and maalox. GI consulted for refractory GERD and due to concern for possible pill esophagitis (patient on alendronate at home). EGD showed LA Grade D esophagitis with no  signs of bleeding, a 1-2cm hiatal hernia, erosive gastropathy with no signs of bleeding, and a non-bleeding duodenal ulcer. GI recommendations as follows: discharge with soft diet x2 weeks, carafate TID, protonix 446mBID. Surgical path pending. F/u with GI as outpatient in 2 weeks. Discussed with patient's son, DaJadakiss Barishabout [facility]. COPD exacerbation. Had wheezing and SHOB on admission, started on prednisone, azithromycin, and duonebs along with his home inhaler therapies. Improved over course of admission. Finished steroid and abx course. Will discharge with home inhalers.   HTN - continued amlodipine and lisinopril while here, continue at discharge.   OSA - used CPAP qhs while here.   Hx of PE/DVT s/p IVC filter, on eliquis. Eliquis was held for one day while patient underwent EGD. Resumed today and will continue at discharge.  Discharge Exam:   BP (!) 135/46 (BP Location: Left Arm)   Pulse 83   Temp 97.9 F (36.6 C) (Oral)   Resp 18   Ht _0  (1.778 m)   Wt 77.9 kg   SpO2 97%   BMI 24.64 kg/m  Discharge exam:  General: pleasant elderly male, sitting up in bed, NAD. HENT: very hard of hearing. CV: normal rate and regular rhythm, no m/r/g. Pulm: clear to auscultation bilaterally, no adventitious sounds noted. Skin: warm and dry, chronic bruising noted over bilateral arms. Neuro: AAOx4, no focal deficits.  Pertinent Labs, Studies, and Procedures:  CBC Latest Ref Rng & Units 11/07/2021 11/06/2021 11/05/2021  WBC 4.0 - 10.5 K/uL 13.2(H) 14.7(H) 11.7(H)  Hemoglobin 13.0 - 17.0 g/dL 12.8(L) 14.7 14.8  Hematocrit 39.0 - 52.0 % 36.8(L) 40.5 40.4  Platelets 150 - 400 K/uL 222 247 227   BMP Latest Ref Rng & Units 11/08/2021 11/07/2021 11/06/2021  Glucose 70 - 99 mg/dL 95 113(H) 158(H)  BUN 8 - 23 mg/dL 31(H) 31(H) 30(H)  Creatinine 0.61 - 1.24 mg/dL 1.15 1.09 1.05  Sodium 135 - 145 mmol/L 128(L) 127(L) 128(L)  Potassium 3.5 - 5.1  mmol/L 3.5 3.7 4.2  Chloride 98 - 111 mmol/L 97(L) 98 97(L)  CO2 22 - 32 mmol/L _1 Calcium 8.9 - 10.3 mg/dL 7.7(L) 7.7(L) 8.2(L)   Lipase     Component Value Date/Time   LIPASE 48 11/01/2021 0209   Hepatic Function Latest Ref Rng & Units 11/02/2021 11/01/2021 10/31/2021  Total Protein 6.5 - 8.1 g/dL 5.3(L) 5.9(L) 6.3(L)  Albumin 3.5 - 5.0 g/dL 2.8(L) 3.0(L) 3.3(L)  AST 15 - 41 U/L 43(H) 38 33  ALT 0 - 44 U/L 22 21 24  Alk Phosphatase 38 - 126 U/L 62 55 60  Total Bilirubin 0.3 - 1.2 mg/dL 2.2(H) 1.9(H) 1.7(H)  Bilirubin, Direct 0.0 - 0.2 mg/dL - 0.4(H) -   ABG    Component Value Date/Time   PHART 7.407 10/31/2021 1840   PCO2ART 31.3 (L) 10/31/2021 1840   PO2ART 77.0 (L) 10/31/2021 1840   HCO3 19.3 (L) 10/31/2021 1840   TCO2 17 (L) 10/31/2021 1245   ACIDBASEDEF 4.6 (H) 10/31/2021 1840   O2SAT 95.5 10/31/2021 1840   Urinalysis    Component Value Date/Time   COLORURINE YELLOW 10/31/2021 [place] 10/31/2021 1224   LABSPEC 1.013 10/31/2021 1224   PHURINE 7.0 10/31/2021 1224   GLUCOSEU NEGATIVE 10/31/2021 1224   HGBUR NEGATIVE 10/31/2021 [place] 10/31/2021 1224   KETONESUR 20 (A) 10/31/2021 1224   PROTEINUR NEGATIVE 10/31/2021 1224   NITRITE NEGATIVE 10/31/2021 1224   LEUKOCYTESUR NEGATIVE 10/31/2021 1224   Urine osm 502 Urine sodium 67 Serum osm 259  BNP    Component Value Date/Time   BNP 129.2 (H) 10/31/2021 1853    Discharge Instructions: Discharge Instructions     Diet - low sodium heart healthy   Complete by: As directed    Increase activity slowly   Complete by: As directed        Signed: Virl Axe, MD 11/08/2021, 1:59 PM   Pager: [phone]

## 2021-11-08 NOTE — Progress Notes (Signed)
Pt placed on CPAP. RT will cont to monitor.

## 2021-11-08 NOTE — Progress Notes (Signed)
Occupational Therapy Treatment Patient Details Name: Marcus Martin MRN: [id number] DOB: 12/03/1933 Today's Date: 11/08/2021   History of present illness Pt is an 87 y/o male, presenting 11/28 from [facility] for confusion and weakness.  PMHX: Diverticulitis (self-reported), NSTEMI s/p PCI, afib, COPD, HTN, PE with IVC filter, peripheral neuropathy, bil hearing loss.   OT comments  Marcus Martin is progressing well with plans to d/c to SNF today. He completed bed mobility with min A and increased time. He completed LB dressing with mod A and UB with set up A in sitting. He required increased time and sitting rest breaks after each ADL this session due to SOB and general fatigue, VSS on RA. He continues to benefit from OT acutely. D/c recommendation remains appropriate.    Recommendations for follow up therapy are one component of a multi-disciplinary discharge planning process, led by the attending physician.  Recommendations may be updated based on patient status, additional functional criteria and insurance authorization.    Follow Up Recommendations  Skilled nursing-short term rehab (<3 hours/day)    Assistance Recommended at Discharge Frequent or constant Supervision/Assistance  Equipment Recommendations  None recommended by OT       Precautions / Restrictions Precautions Precautions: Fall Restrictions Weight Bearing Restrictions: No       Mobility Bed Mobility Overal bed mobility: Needs Assistance       Supine to sit: Min assist     General bed mobility comments: min A to adjust hips towards EOB with use of chuck pad    Transfers Overall transfer level: Needs assistance Equipment used: Rolling walker (2 wheels) Transfers: Sit to/from Stand;Bed to chair/wheelchair/BSC Sit to Stand: Min assist   Step pivot transfers: Min guard       General transfer comment: minA to powerr into standing, close min guard once standing     Balance Overall balance assessment:  Needs assistance Sitting-balance support: Feet supported Sitting balance-Leahy Scale: Fair     Standing balance support: Single extremity supported;During functional activity Standing balance-Leahy Scale: Fair                             ADL either performed or assessed with clinical judgement   ADL Overall ADL's : Needs assistance/impaired     Grooming: Set up;Sitting           Upper Body Dressing : Set up;Sitting Upper Body Dressing Details (indicate cue type and reason): sitting in chair Lower Body Dressing: Moderate assistance;Sit to/from stand Lower Body Dressing Details (indicate cue type and reason): mod A to thread LB clothes. alos for balance in unsupported standing Toilet Transfer: Min guard;Rolling walker (2 wheels) Toilet Transfer Details (indicate cue type and reason): simulated from bed>chair. pt denies need to go to the bathroom         Functional mobility during ADLs: Min guard;Rolling walker (2 wheels) General ADL Comments: pt required increased time for all tasks this session. SOB after LB dressing and transfer.    Extremity/Trunk Assessment Upper Extremity Assessment Upper Extremity Assessment: Generalized weakness   Lower Extremity Assessment Lower Extremity Assessment: Defer to PT evaluation        Vision   Vision Assessment?: No apparent visual deficits Additional Comments: wears glasses   Perception Perception Perception: Within Functional Limits   Praxis Praxis Praxis: Intact    Cognition Arousal/Alertness: Awake/alert Behavior During Therapy: WFL for tasks assessed/performed Overall Cognitive Status: Impaired/Different from baseline  Memory: Decreased short-term memory   Safety/Judgement: Decreased awareness of safety;Decreased awareness of deficits Awareness: Intellectual Problem Solving: Slow processing;Requires verbal cues General Comments: HOH, pt's cog WFL for ADLs and simple  transfers assessed this session. Requires incrased time for all tasks.                General Comments VSS on RA, pt had hiccups and SOB after dressing. resting comfortly at end of session, SpO2 at 96%    Pertinent Vitals/ Pain       Pain Assessment: Faces Faces Pain Scale: Hurts a little bit Pain Location: reflux, hiccups Pain Descriptors / Indicators: Discomfort;Sore Pain Intervention(s): Monitored during session   Frequency  Min 2X/week        Progress Toward Goals  OT Goals(current goals can now be found in the care plan section)  Progress towards OT goals: Progressing toward goals  Acute Rehab OT Goals Patient Stated Goal: go to rehab OT Goal Formulation: With patient Time For Goal Achievement: 11/15/21 Potential to Achieve Goals: Good ADL Goals Pt Will Perform Upper Body Bathing: with supervision Pt Will Perform Lower Body Bathing: with supervision Pt Will Transfer to Toilet: with supervision Additional ADL Goal #1: Pt will complete bed mobility supervision as precursor to adls.  Plan Frequency remains appropriate;Discharge plan needs to be updated       AM-PAC OT "6 Clicks" Daily Activity     Outcome Measure   Help from another person eating meals?: A Little Help from another person taking care of personal grooming?: A Little Help from another person toileting, which includes using toliet, bedpan, or urinal?: A Little Help from another person bathing (including washing, rinsing, drying)?: A Little Help from another person to put on and taking off regular upper body clothing?: A Little Help from another person to put on and taking off regular lower body clothing?: A Little 6 Click Score: 18    End of Session Equipment Utilized During Treatment: Gait belt;Rolling walker (2 wheels)  OT Visit Diagnosis: Unsteadiness on feet (R26.81);Muscle weakness (generalized) (M62.81)   Activity Tolerance Patient tolerated treatment well   Patient Left in chair;with  call bell/phone within reach;with chair alarm set   Nurse Communication Mobility status        Time: 1601-0932 OT Time Calculation (min): 16 min  Charges: OT General Charges $OT Visit: 1 Visit OT Treatments $Self Care/Home Management : 8-22 mins   Kristoffer Bala A Praise Stennett 11/08/2021, 2:35 PM

## 2021-11-08 NOTE — Plan of Care (Signed)
  Problem: Education: Goal: Knowledge of General Education information will improve Description: Including pain rating scale, medication(s)/side effects and non-pharmacologic comfort measures Outcome: Adequate for Discharge   

## 2021-11-08 NOTE — H&P (Deleted)
Name: Marcus Martin MRN: [id number] DOB: 09-Jun-1933 87 y.o. PCP: Curly Rim, MD  Date of Admission: 10/31/2021 12:29 PM Date of Discharge: 11/08/2021 Attending Physician: Lucious Groves, DO  Discharge Diagnosis: 1. Acute metabolic encephalopathy - resolved 2. 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 Severe reflux esophagitis 4. COPD exacerbation - resolved 5. HTN 6. OSA 7. Hx of PE/DVT s/p IVC filter, on eliquis  Discharge Medications: Allergies as of 11/08/2021       Reactions   Ticagrelor Other (See Comments)   "GI Hemorrhage"   Tetanus Toxoids Other (See Comments)   "Horse serum only"   Coffea Arabica    Other reaction(s): Other (See Comments) DECAF coffee causes vision changes per pt. DECAF coffee causes vision changes per pt.   Niacin    Other reaction(s): Flushing (disorder), Other (See Comments) [facility] but patient can not remember exact reaction [facility] but patient can not remember exact reaction   Niacin And Related Other (See Comments)   Flushing   Niacin Er Other (See Comments)   Flushing   Simvastatin Other (See Comments)   "Muscle pain" Other reaction(s): Muscle pain Other reaction(s): Muscle pain (finding), Muscle pain (finding), Muscle pain (finding), Muscle pain (finding), Other (See Comments)   Tiotropium    Other reaction(s): Retention of urine        Medication List     STOP taking these medications    alendronate 70 MG tablet Commonly known as: FOSAMAX       TAKE these medications    acetaminophen 500 MG tablet Commonly known as: TYLENOL Take 500 mg by mouth 3 (three) times daily as needed for mild pain, headache or fever.   albuterol 108 (90 Base) MCG/ACT inhaler Commonly known as: VENTOLIN HFA Inhale 2 puffs into the lungs every 6 (six) hours as needed for wheezing or shortness of breath.   amLODipine 5 MG tablet Commonly known as: NORVASC Take 1 tablet (5 mg total) by mouth  daily. Start taking on: November 09, 2021   apixaban 5 MG Tabs tablet Commonly known as: ELIQUIS Take 5 mg by mouth 2 (two) times daily.   atorvastatin 20 MG tablet Commonly known as: LIPITOR Take 20 mg by mouth every evening.   B COMPLEX PO Take 1 tablet by mouth daily.   CALCIUM 500/VITAMIN D PO Take 1 tablet by mouth daily.   Ensure Take 237 mLs by mouth in the morning and at bedtime.   ferrous sulfate 325 (65 FE) MG tablet Take 325 mg by mouth every Monday, Wednesday, and Friday.   fluticasone-salmeterol 250-50 MCG/ACT Aepb Commonly known as: ADVAIR Inhale 1 puff into the lungs in the morning and at bedtime.   furosemide 20 MG tablet Commonly known as: LASIX Take 1 tablet (20 mg total) by mouth 2 (two) times daily.   gabapentin 300 MG capsule Commonly known as: NEURONTIN Take 600 mg by mouth at bedtime. Also has 400 mg dosage for daytime   gabapentin 400 MG capsule Commonly known as: NEURONTIN Take 400 mg by mouth in the morning and at bedtime. Also has 300 mg dose for bedtime   latanoprost 0.005 % ophthalmic solution Commonly known as: XALATAN Place 1 drop into both eyes at bedtime.   lisinopril 40 MG tablet Commonly known as: ZESTRIL Take 1 tablet (40 mg total) by mouth daily. Start taking on: November 09, 2021 What changed:  medication strength how much to take additional instructions   LUTEIN PO  Take 1 capsule by mouth daily.   multivitamin tablet Take 1 tablet by mouth daily.   pantoprazole 40 MG tablet Commonly known as: PROTONIX Take 1 tablet (40 mg total) by mouth 2 (two) times daily before a meal. What changed:  when to take this reasons to take this   polyvinyl alcohol 1.4 % ophthalmic solution Commonly known as: LIQUIFILM TEARS Place 1 drop into both eyes daily as needed for dry eyes.   PROBIOTIC PO Take 1 capsule by mouth daily.   silver sulfADIAZINE 1 % cream Commonly known as: SILVADENE Apply 1 application topically in the  morning and at bedtime.   Simbrinza 1-0.2 % Susp Generic drug: Brinzolamide-Brimonidine Place 1 drop into both eyes in the morning and at bedtime.   sodium chloride 1 g tablet Take 1 tablet (1 g total) by mouth 3 (three) times daily with meals.   sucralfate 1 GM/10ML suspension Commonly known as: CARAFATE Take 10 mLs (1 g total) by mouth 4 (four) times daily -  with meals and at bedtime.   tamsulosin 0.4 MG Caps capsule Commonly known as: FLOMAX Take 0.4 mg by mouth at bedtime. Hold for SBP <110   triamcinolone 0.025 % cream Commonly known as: KENALOG Apply 1 application topically 2 (two) times daily.   Triple Antibiotic Oint Place 1 application onto the skin 2 (two) times daily as needed (minor wounds/cuts).   Tudorza Pressair 400 MCG/ACT Aepb Generic drug: Aclidinium Bromide Inhale 1 puff into the lungs in the morning and at bedtime.   vitamin C 500 MG tablet Commonly known as: ASCORBIC ACID Take 500 mg by mouth daily.   Vitamin D3 25 MCG (1000 UT) Caps Take 1,000 Units by mouth daily.        Disposition and follow-up:   Mr.Marcus Martin was discharged from [facility] in Stable condition.  At the hospital follow up visit please address:  1.  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 Encephalopathy has resolved, back to baseline. Sodium levels also back to baseline (baseline 126-128), currently 128. Will discharge with salt tablets TID and lasix 75m BID. Please repeat BMP in 1 week and discontinue lasix if necessary.  2. Severe reflux esophagitis. EGD showing LA Grade D esophagitis with no signs of bleeding, a 1-2cm hiatal hernia, erosive gastropathy with no signs of bleeding, and a non-bleeding duodenal ulcer. Discharge with soft diet x2 weeks, carafate TID, protonix 452mBID. F/u with GI as outpatient in 2 weeks.  3. COPD exacerbation. Resolved, discharging with home inhalers.  4. HTN.  Was  on lisinopril 2065maily on admission, but BP not well controlled while here. Increased lisinopril to 71m7mily and added amlodipine 5mg 85mly. BP well controlled with these and will discharge with both of these. Can titrate as necessary.   5. Hx of DVT/PE s/p IVC filter. Resumed home eliquis at discharge.  2.  Labs / imaging needed at time of follow-up: BMP in 1 week  3.  Pending labs/ test needing follow-up: Pathology results  Follow-up Appointments:   Hospital Course by problem list: 1. 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  - Presented with acute metabolic encephalopathy. CT head and MRI brain on admission with no acute findings. Urinalysis unremarkable aside from small amount of ketones. CXR with no focal infiltrates. CMP showing hyponatremia with sodium of 122 (baseline 126-128). Per chart review, hyponatremia previously worked up and attributed to SiADHBB&T Corporationne osm  and urine sodium both elevated. Patient was having wheezing and SHOB, so initially concerned for COPD exacerbation playing role in encephalopathy, started on duonebs, prednisone, azithromycin. Started on fluid restriction and NS, but sodium levels remained low (lowest value of 117-118 during course). COPD exacerbation improved. Started on salt tablets TID and lasix 47m BID, with resultant increase in sodium up to 128. Will discharge with salt tabs and lasix as he still has some free water excess. Repeat BMP in 1 week. Can discontinue lasix if he has hypotension or orthostatic hypotension. [facility]   2. Severe reflux esophagitis.  - During hospital course, patient was having severe heartburn leading to constant hiccups/belching. No improvement with protonix, carafate, and maalox. GI consulted for refractory GERD and due to concern for possible pill esophagitis (patient on alendronate at home). EGD showed LA Grade D esophagitis with no  signs of bleeding, a 1-2cm hiatal hernia, erosive gastropathy with no signs of bleeding, and a non-bleeding duodenal ulcer. GI recommendations as follows: discharge with soft diet x2 weeks, carafate TID, protonix 446mBID. Surgical path pending. F/u with GI as outpatient in 2 weeks. Discussed with patient's son, DaJadakiss Barishabout [facility]. COPD exacerbation. Had wheezing and SHOB on admission, started on prednisone, azithromycin, and duonebs along with his home inhaler therapies. Improved over course of admission. Finished steroid and abx course. Will discharge with home inhalers.   HTN - continued amlodipine and lisinopril while here, continue at discharge.   OSA - used CPAP qhs while here.   Hx of PE/DVT s/p IVC filter, on eliquis. Eliquis was held for one day while patient underwent EGD. Resumed today and will continue at discharge.  Discharge Exam:   BP (!) 135/46 (BP Location: Left Arm)   Pulse 83   Temp 97.9 F (36.6 C) (Oral)   Resp 18   Ht _0  (1.778 m)   Wt 77.9 kg   SpO2 97%   BMI 24.64 kg/m  Discharge exam:  General: pleasant elderly male, sitting up in bed, NAD. HENT: very hard of hearing. CV: normal rate and regular rhythm, no m/r/g. Pulm: clear to auscultation bilaterally, no adventitious sounds noted. Skin: warm and dry, chronic bruising noted over bilateral arms. Neuro: AAOx4, no focal deficits.  Pertinent Labs, Studies, and Procedures:  CBC Latest Ref Rng & Units 11/07/2021 11/06/2021 11/05/2021  WBC 4.0 - 10.5 K/uL 13.2(H) 14.7(H) 11.7(H)  Hemoglobin 13.0 - 17.0 g/dL 12.8(L) 14.7 14.8  Hematocrit 39.0 - 52.0 % 36.8(L) 40.5 40.4  Platelets 150 - 400 K/uL 222 247 227   BMP Latest Ref Rng & Units 11/08/2021 11/07/2021 11/06/2021  Glucose 70 - 99 mg/dL 95 113(H) 158(H)  BUN 8 - 23 mg/dL 31(H) 31(H) 30(H)  Creatinine 0.61 - 1.24 mg/dL 1.15 1.09 1.05  Sodium 135 - 145 mmol/L 128(L) 127(L) 128(L)  Potassium 3.5 - 5.1  mmol/L 3.5 3.7 4.2  Chloride 98 - 111 mmol/L 97(L) 98 97(L)  CO2 22 - 32 mmol/L _1 Calcium 8.9 - 10.3 mg/dL 7.7(L) 7.7(L) 8.2(L)   Lipase     Component Value Date/Time   LIPASE 48 11/01/2021 0209   Hepatic Function Latest Ref Rng & Units 11/02/2021 11/01/2021 10/31/2021  Total Protein 6.5 - 8.1 g/dL 5.3(L) 5.9(L) 6.3(L)  Albumin 3.5 - 5.0 g/dL 2.8(L) 3.0(L) 3.3(L)  AST 15 - 41 U/L 43(H) 38 33  ALT 0 - 44 U/L 22 21 24  Alk Phosphatase 38 - 126 U/L 62 55 60  Total Bilirubin 0.3 - 1.2 mg/dL 2.2(H) 1.9(H) 1.7(H)  Bilirubin, Direct 0.0 - 0.2 mg/dL - 0.4(H) -   ABG    Component Value Date/Time   PHART 7.407 10/31/2021 1840   PCO2ART 31.3 (L) 10/31/2021 1840   PO2ART 77.0 (L) 10/31/2021 1840   HCO3 19.3 (L) 10/31/2021 1840   TCO2 17 (L) 10/31/2021 1245   ACIDBASEDEF 4.6 (H) 10/31/2021 1840   O2SAT 95.5 10/31/2021 1840   Urinalysis    Component Value Date/Time   COLORURINE YELLOW 10/31/2021 [place] 10/31/2021 1224   LABSPEC 1.013 10/31/2021 1224   PHURINE 7.0 10/31/2021 1224   GLUCOSEU NEGATIVE 10/31/2021 1224   HGBUR NEGATIVE 10/31/2021 [place] 10/31/2021 1224   KETONESUR 20 (A) 10/31/2021 1224   PROTEINUR NEGATIVE 10/31/2021 1224   NITRITE NEGATIVE 10/31/2021 1224   LEUKOCYTESUR NEGATIVE 10/31/2021 1224   Urine osm 502 Urine sodium 67 Serum osm 259  BNP    Component Value Date/Time   BNP 129.2 (H) 10/31/2021 1853    Discharge Instructions: Discharge Instructions     Diet - low sodium heart healthy   Complete by: As directed    Increase activity slowly   Complete by: As directed        Signed: Virl Axe, MD 11/08/2021, 1:58 PM   Pager: [phone]

## 2021-11-08 NOTE — TOC Transition Note (Signed)
Transition of Care [facility]) - CM/SW Discharge Note   Patient Details  Name: Marcus Martin MRN: [id number] Date of Birth: Feb 26, 1933  [facility] CM/SW Contact:  Pollie Friar, RN Phone Number: 11/08/2021, 2:02 PM   Clinical Narrative:    Patient is discharging to [facility]. Pt will transport via ambulance. Bedside RN updated and d/c packet is at the desk.   Room: 38 Number for report: [phone]   Final next level of care: Skilled Nursing Facility Barriers to Discharge: No Barriers Identified   Patient Goals and CMS Choice   CMS Medicare.gov Compare Post Acute Care list provided to:: Patient Represenative (must comment) Choice offered to / list presented to : Patient  Discharge Placement              Patient chooses bed at: [facility] Patient to be transferred to facility by: Ambulance Name of family member notified: Maudie Mercury Patient and family notified of of transfer: 11/08/21  Discharge Plan and Services In-house Referral: Clinical Social Work Discharge Planning Services: [employer] Consult Post Acute Care Choice: [facility]                               Social Determinants of Health (SDOH) Interventions     Readmission Risk Interventions No flowsheet data found.

## 2021-11-09 ENCOUNTER — Encounter (HOSPITAL_COMMUNITY): Payer: Self-pay | Admitting: Gastroenterology

## 2021-11-09 ENCOUNTER — Encounter: Payer: Self-pay | Admitting: Gastroenterology

## 2021-11-16 ENCOUNTER — Other Ambulatory Visit: Payer: Self-pay

## 2021-11-16 ENCOUNTER — Emergency Department (HOSPITAL_COMMUNITY): Payer: No Typology Code available for payment source

## 2021-11-16 ENCOUNTER — Encounter (HOSPITAL_COMMUNITY): Payer: Self-pay

## 2021-11-16 ENCOUNTER — Observation Stay (HOSPITAL_COMMUNITY)
Admission: EM | Admit: 2021-11-16 | Discharge: 2021-11-19 | Disposition: A | Discharge status: Skilled Nursing Facility | Payer: No Typology Code available for payment source | Attending: Internal Medicine | Admitting: Internal Medicine

## 2021-11-16 ENCOUNTER — Observation Stay (HOSPITAL_COMMUNITY): Payer: No Typology Code available for payment source

## 2021-11-16 DIAGNOSIS — Z20822 Contact with and (suspected) exposure to covid-19: Secondary | ICD-10-CM | POA: Diagnosis not present

## 2021-11-16 DIAGNOSIS — I2510 Atherosclerotic heart disease of native coronary artery without angina pectoris: Secondary | ICD-10-CM | POA: Diagnosis not present

## 2021-11-16 DIAGNOSIS — I10 Essential (primary) hypertension: Secondary | ICD-10-CM | POA: Insufficient documentation

## 2021-11-16 DIAGNOSIS — R0602 Shortness of breath: Secondary | ICD-10-CM | POA: Diagnosis not present

## 2021-11-16 DIAGNOSIS — Z79899 Other long term (current) drug therapy: Secondary | ICD-10-CM | POA: Diagnosis not present

## 2021-11-16 DIAGNOSIS — R6520 Severe sepsis without septic shock: Secondary | ICD-10-CM

## 2021-11-16 DIAGNOSIS — K8020 Calculus of gallbladder without cholecystitis without obstruction: Secondary | ICD-10-CM | POA: Diagnosis present

## 2021-11-16 DIAGNOSIS — K209 Esophagitis, unspecified without bleeding: Secondary | ICD-10-CM | POA: Diagnosis not present

## 2021-11-16 DIAGNOSIS — R4182 Altered mental status, unspecified: Secondary | ICD-10-CM | POA: Diagnosis present

## 2021-11-16 DIAGNOSIS — Z85828 Personal history of other malignant neoplasm of skin: Secondary | ICD-10-CM | POA: Insufficient documentation

## 2021-11-16 DIAGNOSIS — N179 Acute kidney failure, unspecified: Secondary | ICD-10-CM | POA: Insufficient documentation

## 2021-11-16 DIAGNOSIS — J449 Chronic obstructive pulmonary disease, unspecified: Secondary | ICD-10-CM | POA: Insufficient documentation

## 2021-11-16 DIAGNOSIS — E871 Hypo-osmolality and hyponatremia: Secondary | ICD-10-CM | POA: Insufficient documentation

## 2021-11-16 DIAGNOSIS — Z7901 Long term (current) use of anticoagulants: Secondary | ICD-10-CM | POA: Diagnosis not present

## 2021-11-16 DIAGNOSIS — Z87891 Personal history of nicotine dependence: Secondary | ICD-10-CM | POA: Insufficient documentation

## 2021-11-16 DIAGNOSIS — I700 Atherosclerosis of aorta: Secondary | ICD-10-CM | POA: Diagnosis present

## 2021-11-16 DIAGNOSIS — G9340 Encephalopathy, unspecified: Secondary | ICD-10-CM | POA: Diagnosis not present

## 2021-11-16 DIAGNOSIS — E440 Moderate protein-calorie malnutrition: Secondary | ICD-10-CM | POA: Insufficient documentation

## 2021-11-16 DIAGNOSIS — I4891 Unspecified atrial fibrillation: Secondary | ICD-10-CM | POA: Diagnosis not present

## 2021-11-16 DIAGNOSIS — J029 Acute pharyngitis, unspecified: Secondary | ICD-10-CM | POA: Diagnosis present

## 2021-11-16 DIAGNOSIS — A419 Sepsis, unspecified organism: Secondary | ICD-10-CM

## 2021-11-16 LAB — TROPONIN I (HIGH SENSITIVITY)
Troponin I (High Sensitivity): 23 ng/L — ABNORMAL HIGH (ref <18)
Troponin I (High Sensitivity): 23 ng/L — ABNORMAL HIGH (ref <18)

## 2021-11-16 LAB — CBC WITH DIFFERENTIAL/PLATELET
Abs Immature Granulocytes: 0.16 10*3/uL — ABNORMAL HIGH (ref 0.00–0.07)
Basophils Absolute: 0.1 10*3/uL (ref 0.0–0.1)
Basophils Relative: 0 %
Eosinophils Absolute: 0 10*3/uL (ref 0.0–0.5)
Eosinophils Relative: 0 %
HCT: 40.9 % (ref 39.0–52.0)
Hemoglobin: 13.9 g/dL (ref 13.0–17.0)
Immature Granulocytes: 1 %
Lymphocytes Relative: 7 %
Lymphs Abs: 1.1 10*3/uL (ref 0.7–4.0)
MCH: 31.7 pg (ref 26.0–34.0)
MCHC: 34 g/dL (ref 30.0–36.0)
MCV: 93.4 fL (ref 80.0–100.0)
Monocytes Absolute: 1 10*3/uL (ref 0.1–1.0)
Monocytes Relative: 6 %
Neutro Abs: 13.8 10*3/uL — ABNORMAL HIGH (ref 1.7–7.7)
Neutrophils Relative %: 86 %
Platelets: 309 10*3/uL (ref 150–400)
RBC: 4.38 MIL/uL (ref 4.22–5.81)
RDW: 12.7 % (ref 11.5–15.5)
WBC: 16.2 10*3/uL — ABNORMAL HIGH (ref 4.0–10.5)
nRBC: 0 % (ref 0.0–0.2)

## 2021-11-16 LAB — COMPREHENSIVE METABOLIC PANEL
ALT: 117 U/L — ABNORMAL HIGH (ref 0–44)
AST: 110 U/L — ABNORMAL HIGH (ref 15–41)
Albumin: 2.3 g/dL — ABNORMAL LOW (ref 3.5–5.0)
Alkaline Phosphatase: 67 U/L (ref 38–126)
Anion gap: 11 (ref 5–15)
BUN: 33 mg/dL — ABNORMAL HIGH (ref 8–23)
CO2: 19 mmol/L — ABNORMAL LOW (ref 22–32)
Calcium: 8 mg/dL — ABNORMAL LOW (ref 8.9–10.3)
Chloride: 98 mmol/L (ref 98–111)
Creatinine, Ser: 1.67 mg/dL — ABNORMAL HIGH (ref 0.61–1.24)
GFR, Estimated: 40 mL/min — ABNORMAL LOW (ref >60)
Glucose, Bld: 98 mg/dL (ref 70–99)
Potassium: 5 mmol/L (ref 3.5–5.1)
Sodium: 128 mmol/L — ABNORMAL LOW (ref 135–145)
Total Bilirubin: 2.4 mg/dL — ABNORMAL HIGH (ref 0.3–1.2)
Total Protein: 5.5 g/dL — ABNORMAL LOW (ref 6.5–8.1)

## 2021-11-16 LAB — URINALYSIS, ROUTINE W REFLEX MICROSCOPIC
Bilirubin Urine: NEGATIVE
Glucose, UA: NEGATIVE mg/dL
Hgb urine dipstick: NEGATIVE
Ketones, ur: NEGATIVE mg/dL
Leukocytes,Ua: NEGATIVE
Nitrite: NEGATIVE
Protein, ur: NEGATIVE mg/dL
Specific Gravity, Urine: 1.01 (ref 1.005–1.030)
pH: 5.5 (ref 5.0–8.0)

## 2021-11-16 LAB — AMMONIA: Ammonia: 53 umol/L — ABNORMAL HIGH (ref 9–35)

## 2021-11-16 LAB — LACTIC ACID, PLASMA
Lactic Acid, Venous: 2.5 mmol/L (ref 0.5–1.9)
Lactic Acid, Venous: 1.7 mmol/L (ref 0.5–1.9)

## 2021-11-16 LAB — RESP PANEL BY RT-PCR (FLU A&B, COVID) ARPGX2
Influenza A by PCR: NEGATIVE
Influenza B by PCR: NEGATIVE
SARS Coronavirus 2 by RT PCR: NEGATIVE

## 2021-11-16 LAB — APTT: aPTT: 38 seconds — ABNORMAL HIGH (ref 24–36)

## 2021-11-16 LAB — CBG MONITORING, ED: Glucose-Capillary: 92 mg/dL (ref 70–99)

## 2021-11-16 LAB — PROTIME-INR
INR: 2.1 — ABNORMAL HIGH (ref 0.8–1.2)
Prothrombin Time: 23.9 seconds — ABNORMAL HIGH (ref 11.4–15.2)

## 2021-11-16 LAB — BRAIN NATRIURETIC PEPTIDE: B Natriuretic Peptide: 57.3 pg/mL (ref 0.0–100.0)

## 2021-11-16 LAB — LIPASE, BLOOD: Lipase: 73 U/L — ABNORMAL HIGH (ref 11–51)

## 2021-11-16 MED ORDER — ATORVASTATIN CALCIUM 10 MG PO TABS
20.0000 mg | ORAL_TABLET | Freq: Every evening | ORAL | Status: DC
  Administered 2021-11-17 – 2021-11-18 (×2): 20 mg via ORAL
  Filled 2021-11-16 (×2): qty 2

## 2021-11-16 MED ORDER — APIXABAN 5 MG PO TABS
5.0000 mg | ORAL_TABLET | Freq: Two times a day (BID) | ORAL | Status: DC
  Administered 2021-11-16 – 2021-11-19 (×6): 5 mg via ORAL
  Filled 2021-11-16 (×6): qty 1

## 2021-11-16 MED ORDER — ALBUTEROL SULFATE (2.5 MG/3ML) 0.083% IN NEBU
3.0000 mL | INHALATION_SOLUTION | Freq: Four times a day (QID) | RESPIRATORY_TRACT | Status: DC | PRN
  Administered 2021-11-17: 3 mL via RESPIRATORY_TRACT
  Filled 2021-11-16: qty 3

## 2021-11-16 MED ORDER — ACETAMINOPHEN 650 MG RE SUPP
650.0000 mg | Freq: Once | RECTAL | Status: AC
  Administered 2021-11-16: 13:00:00 650 mg via RECTAL
  Filled 2021-11-16: qty 1

## 2021-11-16 MED ORDER — LACTATED RINGERS IV SOLN: INTRAVENOUS | Status: AC

## 2021-11-16 MED ORDER — VANCOMYCIN HCL 1500 MG/300ML IV SOLN
1500.0000 mg | Freq: Once | INTRAVENOUS | Status: AC
  Administered 2021-11-16: 14:00:00 1500 mg via INTRAVENOUS
  Filled 2021-11-16: qty 300

## 2021-11-16 MED ORDER — SODIUM CHLORIDE 0.9 % IV BOLUS
1000.0000 mL | Freq: Once | INTRAVENOUS | Status: AC
  Administered 2021-11-16: 15:00:00 1000 mL via INTRAVENOUS

## 2021-11-16 MED ORDER — SODIUM CHLORIDE 1 G PO TABS
1.0000 g | ORAL_TABLET | Freq: Three times a day (TID) | ORAL | Status: DC
  Administered 2021-11-17 – 2021-11-19 (×7): 1 g via ORAL
  Filled 2021-11-16 (×10): qty 1

## 2021-11-16 MED ORDER — METRONIDAZOLE 500 MG/100ML IV SOLN
500.0000 mg | Freq: Once | INTRAVENOUS | Status: AC
  Administered 2021-11-16: 13:00:00 500 mg via INTRAVENOUS
  Filled 2021-11-16: qty 100

## 2021-11-16 MED ORDER — SUCRALFATE 1 GM/10ML PO SUSP
1.0000 g | Freq: Three times a day (TID) | ORAL | Status: DC
Start: 2021-11-16 — End: 2021-11-19
  Administered 2021-11-16 – 2021-11-19 (×10): 1 g via ORAL
  Filled 2021-11-16 (×12): qty 10

## 2021-11-16 MED ORDER — TAMSULOSIN HCL 0.4 MG PO CAPS
0.4000 mg | ORAL_CAPSULE | Freq: Every day | ORAL | Status: DC
  Administered 2021-11-16 – 2021-11-18 (×3): 0.4 mg via ORAL
  Filled 2021-11-16 (×3): qty 1

## 2021-11-16 MED ORDER — SODIUM CHLORIDE 0.9 % IV SOLN
2.0000 g | Freq: Once | INTRAVENOUS | Status: AC
  Administered 2021-11-16: 13:00:00 2 g via INTRAVENOUS
  Filled 2021-11-16: qty 2

## 2021-11-16 MED ORDER — PANTOPRAZOLE SODIUM 40 MG PO TBEC
40.0000 mg | DELAYED_RELEASE_TABLET | Freq: Two times a day (BID) | ORAL | Status: DC
  Administered 2021-11-17 – 2021-11-19 (×5): 40 mg via ORAL
  Filled 2021-11-16 (×6): qty 1

## 2021-11-16 MED ORDER — AMLODIPINE BESYLATE 5 MG PO TABS
5.0000 mg | ORAL_TABLET | Freq: Every day | ORAL | Status: DC
  Administered 2021-11-17 – 2021-11-19 (×3): 5 mg via ORAL
  Filled 2021-11-16 (×3): qty 1

## 2021-11-16 MED ORDER — SODIUM CHLORIDE 0.9 % IV BOLUS
1000.0000 mL | Freq: Once | INTRAVENOUS | Status: AC
  Administered 2021-11-16: 13:00:00 1000 mL via INTRAVENOUS

## 2021-11-16 NOTE — ED Notes (Signed)
Marcus Martin, son, [phone] would like an update when available

## 2021-11-16 NOTE — ED Notes (Signed)
Resident at bedside. MD gives parameters for BP, maintain MAP >60. IV bolus 546mL NS can be given if needed. Pt is Aox4. MD also orders CPAP for pt, RT called.

## 2021-11-16 NOTE — ED Provider Notes (Signed)
[facility] Provider Note   CSN: [id number] Arrival date & time: 11/16/21  1109     History Chief Complaint  Patient presents with   Altered Mental Status    Marcus Martin is a 87 y.o. male with a past medical history of COPD, DVT, GERD, hypertension, prior NSTEMI presenting to the ED with a chief complaint of altered mental status, chest pain, shortness of breath and fever.  History provided using paperwork given by rehab facility.  Patient states that he hurts all over.  Spoke to patient's son and emergency contact Darden Dates, over the phone.  States that in regards to his abdominal distention this is not a new or recent issue for him.  He did have a work-up at his admission last week with an EGD which showed esophagitis and was placed on Carafate and Protonix and told to follow-up with GI.  His primary concern at his admission last week was encephalopathy secondary to acute on chronic hyponatremia.  His mental status was back to baseline at discharge as well as his sodium levels back to baseline.  HPI     Past Medical History:  Diagnosis Date   Bilateral hearing loss    COPD (chronic obstructive pulmonary disease) (HCC)    DVT (deep venous thrombosis) (HCC)    GERD (gastroesophageal reflux disease)    History of pulmonary embolus (PE)    HLD (hyperlipidemia)    HTN (hypertension)    Melanoma (HCC)    NSTEMI (non-ST elevated myocardial infarction) (HCC)    OSA (obstructive sleep apnea)    Peripheral neuropathy    Squamous cell skin cancer     Patient Active Problem List   Diagnosis Date Noted   SIADH (syndrome of inappropriate ADH production) ([place])    Gastroesophageal reflux disease with esophagitis without hemorrhage    COPD exacerbation (HCC)    Abdominal distention    Fall    Hyponatremia    Acute encephalopathy 10/31/2021    Past Surgical History:  Procedure Laterality Date   BIOPSY  11/07/2021   Procedure: BIOPSY;   Surgeon: Doran Stabler, MD;  Location: [facility];  Service: Gastroenterology;;   ESOPHAGOGASTRODUODENOSCOPY (EGD) WITH PROPOFOL N/A 11/07/2021   Procedure: ESOPHAGOGASTRODUODENOSCOPY (EGD) WITH PROPOFOL;  Surgeon: Doran Stabler, MD;  Location: [facility];  Service: Gastroenterology;  Laterality: N/A;   PERCUTANEOUS CORONARY STENT INTERVENTION (PCI-S)     RCA       Family History  Problem Relation Age of Onset   Cancer Mother     Social History   Tobacco Use   Smoking status: Former    Types: Cigarettes    Quit date: 1980    Years since quitting: 42.9  Substance Use Topics   Alcohol use: Not Currently   Drug use: Never    Home Medications Prior to Admission medications   Medication Sig Start Date End Date Taking? Authorizing Provider  acetaminophen (TYLENOL) 500 MG tablet Take 500 mg by mouth 3 (three) times daily as needed for mild pain, headache or fever.   Yes [provider]  albuterol (VENTOLIN HFA) 108 (90 Base) MCG/ACT inhaler Inhale 2 puffs into the lungs every 6 (six) hours as needed for wheezing or shortness of breath.   Yes [provider]  amLODipine (NORVASC) 5 MG tablet Take 1 tablet (5 mg total) by mouth daily. 11/09/21  Yes Virl Axe, MD  apixaban (ELIQUIS) 5 MG TABS tablet Take 5 mg by mouth 2 (  two) times daily.   Yes [provider]  atorvastatin (LIPITOR) 20 MG tablet Take 20 mg by mouth every evening.   Yes [provider]  B Complex Vitamins (B COMPLEX PO) Take 1 tablet by mouth daily.   Yes [provider]  Baclofen 5 MG TABS Take 5 mg by mouth 2 (two) times daily as needed (muscle spasms). 11/09/21  Yes [provider]  Brinzolamide-Brimonidine (SIMBRINZA) 1-0.2 % SUSP Place 1 drop into both eyes in the morning and at bedtime.   Yes [provider]  Calcium Carb-Cholecalciferol (CALCIUM 500/VITAMIN D PO) Take 1 tablet by mouth daily.   Yes [provider]   Cholecalciferol (VITAMIN D3) 25 MCG (1000 UT) CAPS Take 1,000 Units by mouth daily.   Yes [provider]  Ensure (ENSURE) Take 237 mLs by mouth in the morning and at bedtime.   Yes [provider]  ferrous sulfate 325 (65 FE) MG tablet Take 325 mg by mouth every Monday, Wednesday, and Friday.   Yes [provider]  furosemide (LASIX) 20 MG tablet Take 1 tablet (20 mg total) by mouth 2 (two) times daily. 11/08/21  Yes Virl Axe, MD  gabapentin (NEURONTIN) 300 MG capsule Take 600 mg by mouth at bedtime. Also has 400 mg dosage for daytime   Yes [provider]  gabapentin (NEURONTIN) 400 MG capsule Take 400 mg by mouth in the morning and at bedtime. Also has 300 mg dose for bedtime   Yes [provider]  latanoprost (XALATAN) 0.005 % ophthalmic solution Place 1 drop into both eyes at bedtime.   Yes [provider]  lisinopril (ZESTRIL) 40 MG tablet Take 1 tablet (40 mg total) by mouth daily. 11/09/21  Yes Virl Axe, MD  LUTEIN PO Take 1 capsule by mouth daily.   Yes [provider]  Multiple Vitamin (MULTIVITAMIN) tablet Take 1 tablet by mouth daily.   Yes [provider]  Neomycin-Bacitracin-Polymyxin (TRIPLE ANTIBIOTIC) OINT Place 1 application onto the skin 2 (two) times daily as needed (minor wounds/cuts).   Yes [provider]  pantoprazole (PROTONIX) 40 MG tablet Take 1 tablet (40 mg total) by mouth 2 (two) times daily before a meal. 11/08/21  Yes Virl Axe, MD  polyvinyl alcohol (LIQUIFILM TEARS) 1.4 % ophthalmic solution Place 1 drop into both eyes daily as needed for dry eyes.   Yes [provider]  Probiotic Product (PROBIOTIC PO) Take 1 capsule by mouth daily.   Yes [provider]  silver sulfADIAZINE (SILVADENE) 1 % cream Apply 1 application topically in the morning and at bedtime.   Yes [provider]  sodium chloride 1 g tablet Take 1 tablet (1 g total) by mouth 3  (three) times daily with meals. 11/08/21  Yes Virl Axe, MD  sucralfate (CARAFATE) 1 GM/10ML suspension Take 10 mLs (1 g total) by mouth 4 (four) times daily -  with meals and at bedtime. 11/08/21  Yes Virl Axe, MD  tamsulosin (FLOMAX) 0.4 MG CAPS capsule Take 0.4 mg by mouth at bedtime. Hold for SBP <110   Yes [provider]  triamcinolone (KENALOG) 0.025 % cream Apply 1 application topically 2 (two) times daily.   Yes [provider]  vitamin C (ASCORBIC ACID) 500 MG tablet Take 500 mg by mouth daily.   Yes [provider]    Allergies    Ticagrelor, Tetanus toxoids, Coffea arabica, Niacin, Niacin and related, Niacin er, Simvastatin, and Tiotropium  Review of Systems  Review of Systems  Unable to perform ROS: Mental status change  Respiratory:  Positive for shortness of breath.   Cardiovascular:  Positive for chest pain.   Physical Exam Updated Vital Signs BP (!) 93/40    Pulse 93    Temp 98.8 F (37.1 C) (Oral)    Resp 20    Ht 5\' 10"  (1.778 m)    Wt 77.9 kg    SpO2 95%    BMI 24.64 kg/m   Physical Exam Vitals and nursing note reviewed.  Constitutional:      General: He is not in acute distress.    Appearance: He is well-developed.     Comments: Increased work of breathing noted.  Speaking in short sentences.  HENT:     Head: Normocephalic and atraumatic.     Nose: Nose normal.  Eyes:     General: No scleral icterus.       Left eye: No discharge.     Conjunctiva/sclera: Conjunctivae normal.  Cardiovascular:     Rate and Rhythm: Regular rhythm. Tachycardia present.     Heart sounds: Normal heart sounds. No murmur heard.   No friction rub. No gallop.  Pulmonary:     Effort: Pulmonary effort is normal. Tachypnea present. No respiratory distress.     Breath sounds: Normal breath sounds.  Abdominal:     General: Bowel sounds are normal. There is distension.     Palpations: Abdomen is soft.     Tenderness: There is no abdominal  tenderness. There is no guarding.     Comments: Abdominal distention noted without focal tenderness.  Musculoskeletal:        General: Normal range of motion.     Cervical back: Normal range of motion and neck supple.  Skin:    General: Skin is warm and dry.     Findings: No rash.  Neurological:     Mental Status: He is alert.     Motor: No abnormal muscle tone.     Coordination: Coordination normal.    ED Results / Procedures / Treatments   Labs (all labs ordered are listed, but only abnormal results are displayed) Labs Reviewed  COMPREHENSIVE METABOLIC PANEL - Abnormal; Notable for the following components:      Result Value   Sodium 128 (*)    CO2 19 (*)    BUN 33 (*)    Creatinine, Ser 1.67 (*)    Calcium 8.0 (*)    Total Protein 5.5 (*)    Albumin 2.3 (*)    AST 110 (*)    ALT 117 (*)    Total Bilirubin 2.4 (*)    GFR, Estimated 40 (*)    All other components within normal limits  LACTIC ACID, PLASMA - Abnormal; Notable for the following components:   Lactic Acid, Venous 2.5 (*)    All other components within normal limits  CBC WITH DIFFERENTIAL/PLATELET - Abnormal; Notable for the following components:   WBC 16.2 (*)    Neutro Abs 13.8 (*)    Abs Immature Granulocytes 0.16 (*)    All other components within normal limits  AMMONIA - Abnormal; Notable for the following components:   Ammonia 53 (*)    All other components within normal limits  LIPASE, BLOOD - Abnormal; Notable for the following components:   Lipase 73 (*)    All other components within normal limits  PROTIME-INR - Abnormal; Notable for the following components:   Prothrombin Time 23.9 (*)  INR 2.1 (*)    All other components within normal limits  APTT - Abnormal; Notable for the following components:   aPTT 38 (*)    All other components within normal limits  TROPONIN I (HIGH SENSITIVITY) - Abnormal; Notable for the following components:   Troponin I (High Sensitivity) 23 (*)    All other  components within normal limits  TROPONIN I (HIGH SENSITIVITY) - Abnormal; Notable for the following components:   Troponin I (High Sensitivity) 23 (*)    All other components within normal limits  RESP PANEL BY RT-PCR (FLU A&B, COVID) [id number]  URINE CULTURE  CULTURE, BLOOD (ROUTINE X 2)  CULTURE, BLOOD (ROUTINE X 2)  BRAIN NATRIURETIC PEPTIDE  LACTIC ACID, PLASMA  URINALYSIS, ROUTINE W REFLEX MICROSCOPIC  CBG MONITORING, ED    EKG EKG Interpretation  Date/Time:  Wednesday November 16 2021 12:09:14 EST Ventricular Rate:  113 PR Interval:  184 QRS Duration: 66 QT Interval:  302 QTC Calculation: 414 R Axis:   70 Text Interpretation: Poor baseline Sinus tachycardia with Fusion complexes Low voltage QRS Septal infarct , age undetermined Inferior injury pattern Consider right ventricular involvement in acute inferior infarct Abnormal ECG Confirmed by Dene Gentry [phone]) on 11/16/2021 12:39:53 PM  Radiology DG Chest Portable 1 View  Result Date: 11/16/2021 CLINICAL DATA:  sob EXAM: PORTABLE CHEST 1 VIEW COMPARISON:  November 06, 2021. FINDINGS: No consolidation. No visible pleural effusions or pneumothorax. Similar cardiomediastinal silhouette. Calcific atherosclerosis aorta healing right fourth and fifth rib fractures. ACDF. IMPRESSION: No evidence of acute cardiopulmonary disease.  No change. Electronically Signed   By: Margaretha Sheffield M.D.   On: 11/16/2021 14:51   CT CHEST ABDOMEN PELVIS WO CONTRAST  Result Date: 11/16/2021 CLINICAL DATA:  Altered mental status, weakness, acute abdominal pain, hyponatremia, abdominal distension. History COPD, DVT, GERD, hypertension, melanoma, NSTEMI, former smoker EXAM: CT CHEST, ABDOMEN AND PELVIS WITHOUT CONTRAST TECHNIQUE: Multidetector CT imaging of the chest, abdomen and pelvis was performed following the standard protocol without IV contrast. COMPARISON:  None FINDINGS: CT CHEST FINDINGS Cardiovascular: Atherosclerotic calcifications aorta,  proximal great vessels and coronary arteries. Aorta normal caliber. Heart size normal. No pericardial effusion. Mediastinum/Nodes: Small hiatal hernia. No thoracic adenopathy. Calcified subcarinal and RIGHT hilar lymph nodes. Base of cervical region normal appearance. Lungs/Pleura: Scattered subpleural interstitial changes. Minimal fluid or thickening of LEFT major fissure. Calcified granuloma RIGHT lower lobe. No acute infiltrate, mass, or pneumothorax. Musculoskeletal: Diffuse osseous demineralization. Prior cervical spine fusion. CT ABDOMEN PELVIS FINDINGS Hepatobiliary: Dependent calculi within gallbladder. Liver unremarkable. Pancreas: Atrophic pancreas without mass Spleen: Normal appearance Adrenals/Urinary Tract: Adrenal glands and RIGHT kidney normal appearance. Exophytic cyst upper pole LEFT kidney 2.5 x 2.2 cm. No urinary tract calcification or dilatation. Bladder unremarkable. Stomach/Bowel: Diverticulosis of distal descending and sigmoid colon without evidence of diverticulitis. Normal appendix. Stomach and bowel loops otherwise normal appearance. Vascular/Lymphatic: IVC filter. Atherosclerotic calcifications aorta, iliac arteries, origins of celiac and superior mesenteric arteries. Aorta normal caliber. No adenopathy. Reproductive: Unremarkable prostate gland and seminal vesicles Other: No free air or free fluid.  No hernia. Musculoskeletal: Old appearing superior endplate compression fracture of L3. IMPRESSION: Old granulomatous disease. Cholelithiasis. Distal colonic diverticulosis without evidence of diverticulitis. Small hiatal hernia. No acute intrathoracic, intra-abdominal or intrapelvic abnormalities. Aortic Atherosclerosis (ICD10-I70.0). Electronically Signed   By: Lavonia Dana M.D.   On: 11/16/2021 15:20    Procedures .Critical Care Performed by: Delia Heady, PA-C Authorized by: Delia Heady, PA-C   Critical care provider statement:  Critical care time (minutes):  45   Critical care  time was exclusive of:  Separately billable procedures and treating other patients and teaching time   Critical care was necessary to treat or prevent imminent or life-threatening deterioration of the following conditions:  Circulatory failure, sepsis, respiratory failure, renal failure and cardiac failure   Critical care was time spent personally by me on the following activities:  Development of treatment plan with patient or surrogate, discussions with consultants, evaluation of patient's response to treatment, examination of patient, ordering and performing treatments and interventions, ordering and review of laboratory studies, ordering and review of radiographic studies, pulse oximetry, re-evaluation of patient's condition and review of old charts   I assumed direction of critical care for this patient from another provider in my specialty: no     Care discussed with: admitting provider     Medications Ordered in ED Medications  lactated ringers infusion (has no administration in time range)  vancomycin (VANCOREADY) IVPB 1500 mg/300 mL (1,500 mg Intravenous New Bag/Given 11/16/21 1426)  ceFEPIme (MAXIPIME) 2 g in sodium chloride 0.9 % 100 mL IVPB (0 g Intravenous Stopped 11/16/21 1319)  metroNIDAZOLE (FLAGYL) IVPB 500 mg (0 mg Intravenous Stopped 11/16/21 1426)  sodium chloride 0.9 % bolus 1,000 mL (0 mLs Intravenous Stopped 11/16/21 1451)  acetaminophen (TYLENOL) suppository 650 mg (650 mg Rectal Given 11/16/21 1307)  sodium chloride 0.9 % bolus 1,000 mL (1,000 mLs Intravenous New Bag/Given 11/16/21 1451)    ED Course  I have reviewed the triage vital signs and the nursing notes.  Pertinent labs & imaging results that were available during my care of the patient were reviewed by me and considered in my medical decision making (see chart for details).  Clinical Course as of 11/16/21 1539  Wed Nov 16, 2021  1256 WBC(!): 16.2 [HK]  1530 Troponin I (High Sensitivity)(!): 23 [HK]  1530  Creatinine(!): 1.67 [HK]    Clinical Course User Index [HK] Delia Heady, PA-C   MDM Rules/Calculators/A&P                           [phone] Male with a past medical history of COPD, hyponatremia, prior DVT, hypertension, prior NSTEMI presenting to the ED for altered mental status, chest pain, shortness of breath.  He is febrile to 102.9 rectally upon arrival here.  He is also tachycardic and tachypneic.  He wears supplemental oxygen at baseline remains on the same amount here.  Patient states that he has generalized body aches.  I spoke to his son and emergency contact who states that his primary issue on admission last week was hyponatremia causing encephalopathy.  Will initiate broad-spectrum antibiotics and order work-up  Work-up significant for slight elevation in troponin to 23, slight AKI at 1.67, leukocytosis of 16.  Urinalysis without evidence of infection.  Lipase slightly elevated at 73.  CT of the chest, abdomen pelvis without contrast shows cholelithiasis but no other acute findings.  His blood pressures have been maintained in the 413 systolic and has been given 2 L of IV fluids, broad-spectrum antibiotics.  He will require admission for ongoing management of his sepsis of unknown source.  His fever has improved   Portions of this note were generated with [employer]. Dictation errors may occur despite best attempts at proofreading.  Final Clinical Impression(s) / ED Diagnoses Final diagnoses:  Altered mental status, unspecified altered mental status type  Sepsis with acute renal failure, due  to unspecified organism, unspecified acute renal failure type, unspecified whether septic shock present [facility])    Rx / DC Orders ED Discharge Orders     None        Delia Heady, PA-C 11/16/21 1539    Valarie Merino, MD 11/17/21 1349

## 2021-11-16 NOTE — ED Triage Notes (Addendum)
Patient arrived from [facility] for altered mental status. They found patient with inability to stand and general weakness. Has had hx of hyponatremia. Has some abdominal distention. Arrived on 2L oxygen. On assessment patient breathing rapidly with large distended abdomen. Patient nonverbal. Not tracking

## 2021-11-16 NOTE — ED Notes (Signed)
Patient transported to CT 

## 2021-11-16 NOTE — ED Notes (Signed)
BP 99/43 (59) 565mL Bolus given.

## 2021-11-16 NOTE — H&P (Addendum)
Date: 11/16/2021               Patient Name:  Marcus Martin MRN: [id number]  DOB: 02/17/1933 Age / Sex: 87 y.o., male   PCP: Corrington, Delsa Grana, MD              Medical Service: Internal Medicine Teaching Service              Attending Physician: Dr. Lucious Groves, DO    First Contact: Claudean Kinds, MS IV Pager: [phone]  Second Contact: Dr. Allyson Sabal Pager: [phone]            After Hours (After 5p/  First Contact Pager: [phone]  weekends / holidays): Second Contact Pager: [phone]   Chief Complaint: Confusion  History of Present Illness: Marcus Martin is an 87 year old male with PMH significant for SVT, NSTEMI s/p PCI, COPD, HTN, HLD, Atrial fibrillation, DVT/PE s/p IVC filter placement (on eliquis), squamous cell carcinoma of the skin, GERD, peripheral neuropathy, melanoma, and bilateral hearing loss who presents to the ED complaining of confusion, chest pain, SOB and fever. Per son, pt was at a rehabilitation facility complaining of not eating well, having irregular BM, and diffuse pain all over his body for one week. He notes that the pt suddenly became disoriented at the facility today and started complaining of SOB, chest pain and felt feverish. Son talked to him about 1.5 hours ago and stated that he was not oriented to himself or his environment.   During our encounter, the patient reports that he has pain all over his arms, legs, back, and esophagus, but notes that this pain is not any different than usual. He also states that he has SOB, but it has been ongoing for years and currently feels no different at this time. His last BM was yesterday. He denies any diarrhea, dysuria, headaches, or cough. Of note, during our encounter patient was able to state his name, location, date, and name of his son accurately.  Of note, pt was admitted to our service one week ago for acute encephalopathy secondary to acute on chronic hyponatremia and treated with salt tablets and lasix. Pt's  baseline sodium levels are around 126-128 and was able to increase his sodium levels to baseline on discharge to rehabilitation center. He was also found to have severe reflux esophagitis and had an EGD. He has a follow up appointment with GI in one week.   ED course is as follows: Pt was febrile with a rectal fever of 102.29F with tachycardia and tachypnea. Troponins were flat, elevation in serum creatinine to 1.67 (baseline ~1), leukocytosis of 16. UA is unremarkable. CXR unremarkable. KUB shows moderate rectal stool burden. Pt was given 2L of IV fluids and given vancomycin, cefepime and flagyl.    Meds: Current Facility-Administered Medications  Medication Dose Route Frequency Provider Last Rate Last Admin   albuterol (PROVENTIL) (2.5 MG/3ML) 0.083% nebulizer solution 3 mL  3 mL Inhalation Q6H PRN Virl Axe, MD       Derrill Memo ON 11/17/2021] amLODipine (NORVASC) tablet 5 mg  5 mg Oral Daily Virl Axe, MD       apixaban Arne [place]) tablet 5 mg  5 mg Oral BID Virl Axe, MD       Derrill Memo ON 11/17/2021] atorvastatin (LIPITOR) tablet 20 mg  20 mg Oral QPM Virl Axe, MD       lactated ringers infusion   Intravenous Continuous Khatri, Hina, PA-C       pantoprazole (PROTONIX)  EC tablet 40 mg  40 mg Oral BID AC Virl Axe, MD       sodium chloride tablet 1 g  1 g Oral TID WC Virl Axe, MD       sucralfate (CARAFATE) 1 GM/10ML suspension 1 g  1 g Oral TID WC & HS Virl Axe, MD       tamsulosin (FLOMAX) capsule 0.4 mg  0.4 mg Oral QHS Virl Axe, MD       Current Outpatient Medications  Medication Sig Dispense Refill   acetaminophen (TYLENOL) 500 MG tablet Take 500 mg by mouth 3 (three) times daily as needed for mild pain, headache or fever.     albuterol (VENTOLIN HFA) 108 (90 Base) MCG/ACT inhaler Inhale 2 puffs into the lungs every 6 (six) hours as needed for wheezing or shortness of breath.     amLODipine (NORVASC) 5 MG tablet Take 1 tablet (5 mg total) by mouth  daily.     apixaban (ELIQUIS) 5 MG TABS tablet Take 5 mg by mouth 2 (two) times daily.     atorvastatin (LIPITOR) 20 MG tablet Take 20 mg by mouth every evening.     B Complex Vitamins (B COMPLEX PO) Take 1 tablet by mouth daily.     Baclofen 5 MG TABS Take 5 mg by mouth 2 (two) times daily as needed (muscle spasms).     Brinzolamide-Brimonidine (SIMBRINZA) 1-0.2 % SUSP Place 1 drop into both eyes in the morning and at bedtime.     Calcium Carb-Cholecalciferol (CALCIUM 500/VITAMIN D PO) Take 1 tablet by mouth daily.     Cholecalciferol (VITAMIN D3) 25 MCG (1000 UT) CAPS Take 1,000 Units by mouth daily.     Ensure (ENSURE) Take 237 mLs by mouth in the morning and at bedtime.     ferrous sulfate 325 (65 FE) MG tablet Take 325 mg by mouth every Monday, Wednesday, and Friday.     furosemide (LASIX) 20 MG tablet Take 1 tablet (20 mg total) by mouth 2 (two) times daily. 30 tablet    gabapentin (NEURONTIN) 300 MG capsule Take 600 mg by mouth at bedtime. Also has 400 mg dosage for daytime     gabapentin (NEURONTIN) 400 MG capsule Take 400 mg by mouth in the morning and at bedtime. Also has 300 mg dose for bedtime     latanoprost (XALATAN) 0.005 % ophthalmic solution Place 1 drop into both eyes at bedtime.     lisinopril (ZESTRIL) 40 MG tablet Take 1 tablet (40 mg total) by mouth daily.     LUTEIN PO Take 1 capsule by mouth daily.     Multiple Vitamin (MULTIVITAMIN) tablet Take 1 tablet by mouth daily.     Neomycin-Bacitracin-Polymyxin (TRIPLE ANTIBIOTIC) OINT Place 1 application onto the skin 2 (two) times daily as needed (minor wounds/cuts).     pantoprazole (PROTONIX) 40 MG tablet Take 1 tablet (40 mg total) by mouth 2 (two) times daily before a meal.     polyvinyl alcohol (LIQUIFILM TEARS) 1.4 % ophthalmic solution Place 1 drop into both eyes daily as needed for dry eyes.     Probiotic Product (PROBIOTIC PO) Take 1 capsule by mouth daily.     silver sulfADIAZINE (SILVADENE) 1 % cream Apply 1  application topically in the morning and at bedtime.     sodium chloride 1 g tablet Take 1 tablet (1 g total) by mouth 3 (three) times daily with meals.     sucralfate (CARAFATE) 1 GM/10ML suspension Take  10 mLs (1 g total) by mouth 4 (four) times daily -  with meals and at bedtime. 420 mL 0   tamsulosin (FLOMAX) 0.4 MG CAPS capsule Take 0.4 mg by mouth at bedtime. Hold for SBP <110     triamcinolone (KENALOG) 0.025 % cream Apply 1 application topically 2 (two) times daily.     vitamin C (ASCORBIC ACID) 500 MG tablet Take 500 mg by mouth daily.      Allergies: Allergies as of 11/16/2021 - Review Complete 11/16/2021  Allergen Reaction Noted   Ticagrelor Other (See Comments) 03/29/2018   Tetanus toxoids Other (See Comments) 06/02/2012   Coffea arabica  01/13/2015   Niacin  01/01/2015   Niacin and related Other (See Comments) 11/01/2021   Niacin er Other (See Comments) 12/27/2011   Simvastatin Other (See Comments) 08/18/2009   Tiotropium  08/13/2015   Past Medical History:  Diagnosis Date   Bilateral hearing loss    COPD (chronic obstructive pulmonary disease) (HCC)    DVT (deep venous thrombosis) (HCC)    GERD (gastroesophageal reflux disease)    History of pulmonary embolus (PE)    HLD (hyperlipidemia)    HTN (hypertension)    Melanoma (HCC)    NSTEMI (non-ST elevated myocardial infarction) (HCC)    OSA (obstructive sleep apnea)    Peripheral neuropathy    Squamous cell skin cancer    Past Surgical History:  Procedure Laterality Date   BIOPSY  11/07/2021   Procedure: BIOPSY;  Surgeon: Doran Stabler, MD;  Location: [facility];  Service: Gastroenterology;;   ESOPHAGOGASTRODUODENOSCOPY (EGD) WITH PROPOFOL N/A 11/07/2021   Procedure: ESOPHAGOGASTRODUODENOSCOPY (EGD) WITH PROPOFOL;  Surgeon: Doran Stabler, MD;  Location: [place];  Service: Gastroenterology;  Laterality: N/A;   PERCUTANEOUS CORONARY STENT INTERVENTION (PCI-S)     RCA   Family History  Problem  Relation Age of Onset   Cancer Mother    Social History   Socioeconomic History   Marital status: Widowed    Spouse name: Not on file   Number of children: Not on file   Years of education: Not on file   Highest education level: Not on file  Occupational History   Not on file  Tobacco Use   Smoking status: Former    Types: Cigarettes    Quit date: 1980    Years since quitting: 42.9   Smokeless tobacco: Not on file  Substance and Sexual Activity   Alcohol use: Not Currently   Drug use: Never   Sexual activity: Not on file    Comment: -  Other Topics Concern   Not on file  Social History Narrative   Not on file   Social Determinants of Health   Financial Resource Strain: Not on file  Food Insecurity: Not on file  Transportation Needs: Not on file  Physical Activity: Not on file  Stress: Not on file  Social Connections: Not on file  Intimate Partner Violence: Not on file    Review of Systems: Pertinent items are noted in HPI.  Physical Exam: Blood pressure (!) 95/45, pulse 80, temperature 98.8 F (37.1 C), temperature source Oral, resp. rate 16, height 5\' 10"  (1.778 m), weight 77.9 kg, SpO2 98 %. BP (!) 95/45    Pulse 80    Temp 98.8 F (37.1 C) (Oral)    Resp 16    Ht 5\' 10"  (1.778 m)    Wt 77.9 kg    SpO2 98%    BMI 24.64 kg/m  General Appearance:    Somnolent, cooperative, no distress, appears stated age  Head:    Normocephalic, without obvious abnormality, atraumatic  Eyes:    PERRL, conjunctiva/corneas clear       Ears:    Normal TM's and external ear canals, both ears  Nose:   Nares normal, septum midline, mucosa normal, no drainage    or sinus tenderness  Throat:   Lips, mucosa, and tongue normal; teeth and gums normal  Neck:   Supple, symmetrical, trachea midline, no adenopathy;       thyroid:  No enlargement/tenderness/nodules; no carotid   bruit or JVD  Back:     Symmetric, no curvature, ROM normal  Lungs:     Clear to auscultation bilaterally,  respirations unlabored  Chest wall:    No tenderness or deformity.   Heart:    Regular rate and rhythm, S1 and S2 normal, no murmur, rub   or gallop  Abdomen:     Soft, distended, non-specific diffuse tenderness on palpation, bowel sounds active all four quadrants,    no masses, no organomegaly        Extremities:   Extremities normal, atraumatic, no cyanosis or edema  Pulses:   2+ and symmetric all extremities  Skin:   Skin color, texture, decreased skin turgor, no rashes or lesions  Lymph nodes:   Cervical, supraclavicular, and axillary nodes normal  Neurologic:   CNII-XII intact. Normal strength, sensation and reflexes      Throughout. Alert to verbal stimuli and oriented to person, place and time    Lab results: CBC    Component Value Date/Time   WBC 16.2 (H) 11/16/2021 1250   RBC 4.38 11/16/2021 1250   HGB 13.9 11/16/2021 1250   HCT 40.9 11/16/2021 1250   PLT 309 11/16/2021 1250   MCV 93.4 11/16/2021 1250   MCH 31.7 11/16/2021 1250   MCHC 34.0 11/16/2021 1250   RDW 12.7 11/16/2021 1250   LYMPHSABS 1.1 11/16/2021 1250   MONOABS 1.0 11/16/2021 1250   EOSABS 0.0 11/16/2021 1250   BASOSABS 0.1 11/16/2021 1250    CMP Latest Ref Rng & Units 11/16/2021 11/08/2021 11/07/2021  Glucose 70 - 99 mg/dL 98 95 113(H)  BUN 8 - 23 mg/dL 33(H) 31(H) 31(H)  Creatinine 0.61 - 1.24 mg/dL 1.67(H) 1.15 1.09  Sodium 135 - 145 mmol/L 128(L) 128(L) 127(L)  Potassium 3.5 - 5.1 mmol/L 5.0 3.5 3.7  Chloride 98 - 111 mmol/L 98 97(L) 98  CO2 22 - 32 mmol/L 19(L) 23 24  Calcium 8.9 - 10.3 mg/dL 8.0(L) 7.7(L) 7.7(L)  Total Protein 6.5 - 8.1 g/dL 5.5(L) - -  Total Bilirubin 0.3 - 1.2 mg/dL 2.4(H) - -  Alkaline Phos 38 - 126 U/L 67 - -  AST 15 - 41 U/L 110(H) - -  ALT 0 - 44 U/L 117(H) - -     Imaging results:  DG Chest Portable 1 View  Result Date: 11/16/2021 CLINICAL DATA:  sob EXAM: PORTABLE CHEST 1 VIEW COMPARISON:  November 06, 2021. FINDINGS: No consolidation. No visible pleural effusions  or pneumothorax. Similar cardiomediastinal silhouette. Calcific atherosclerosis aorta healing right fourth and fifth rib fractures. ACDF. IMPRESSION: No evidence of acute cardiopulmonary disease.  No change. Electronically Signed   By: Margaretha Sheffield M.D.   On: 11/16/2021 14:51   CT CHEST ABDOMEN PELVIS WO CONTRAST  Result Date: 11/16/2021 CLINICAL DATA:  Altered mental status, weakness, acute abdominal pain, hyponatremia, abdominal distension. History COPD, DVT, GERD, hypertension, melanoma, NSTEMI, former  smoker EXAM: CT CHEST, ABDOMEN AND PELVIS WITHOUT CONTRAST TECHNIQUE: Multidetector CT imaging of the chest, abdomen and pelvis was performed following the standard protocol without IV contrast. COMPARISON:  None FINDINGS: CT CHEST FINDINGS Cardiovascular: Atherosclerotic calcifications aorta, proximal great vessels and coronary arteries. Aorta normal caliber. Heart size normal. No pericardial effusion. Mediastinum/Nodes: Small hiatal hernia. No thoracic adenopathy. Calcified subcarinal and RIGHT hilar lymph nodes. Base of cervical region normal appearance. Lungs/Pleura: Scattered subpleural interstitial changes. Minimal fluid or thickening of LEFT major fissure. Calcified granuloma RIGHT lower lobe. No acute infiltrate, mass, or pneumothorax. Musculoskeletal: Diffuse osseous demineralization. Prior cervical spine fusion. CT ABDOMEN PELVIS FINDINGS Hepatobiliary: Dependent calculi within gallbladder. Liver unremarkable. Pancreas: Atrophic pancreas without mass Spleen: Normal appearance Adrenals/Urinary Tract: Adrenal glands and RIGHT kidney normal appearance. Exophytic cyst upper pole LEFT kidney 2.5 x 2.2 cm. No urinary tract calcification or dilatation. Bladder unremarkable. Stomach/Bowel: Diverticulosis of distal descending and sigmoid colon without evidence of diverticulitis. Normal appendix. Stomach and bowel loops otherwise normal appearance. Vascular/Lymphatic: IVC filter. Atherosclerotic  calcifications aorta, iliac arteries, origins of celiac and superior mesenteric arteries. Aorta normal caliber. No adenopathy. Reproductive: Unremarkable prostate gland and seminal vesicles Other: No free air or free fluid.  No hernia. Musculoskeletal: Old appearing superior endplate compression fracture of L3. IMPRESSION: Old granulomatous disease. Cholelithiasis. Distal colonic diverticulosis without evidence of diverticulitis. Small hiatal hernia. No acute intrathoracic, intra-abdominal or intrapelvic abnormalities. Aortic Atherosclerosis (ICD10-I70.0). Electronically Signed   By: Lavonia Dana M.D.   On: 11/16/2021 15:20    Other results: EKG: normal EKG, normal sinus rhythm, unchanged from previous tracings, sinus tachycardia.  Assessment & Plan by Problem: Principal Problem:   Altered mental status  Marcus Martin is an 87 year old male with PMH significant for SVT, h/o NSTEMI s/p PCI, Afib, COPD, HTN, HLD, h/o PE with IVC filter placement and DVT, h/o squamous cell carcinoma of the skin, GERD, peripheral neuropathy, melanoma, and bilateral hearing loss who is admitted for acute encephalopathy.  Acute encephalopathy Of note, during encounter patient's mental status not too far from baseline. Likely does have some underlying cognitive decline. Patient has leukocytosis with neutrophil predominance, lactic acid of 2.5, tachycardia, and tachypnea without any clear source of infection. CXR and KUB are unremarkable. No skin tears or abrasions were noted on exam. UA is unremarkable. Pt does have cholelithiasis without evidence of cholecystitis on previous RUQ [place] (11/28) and does endorse diffuse abdominal tenderness on palpation. Although exam was non-focal, given lack of clear source of infection and new elevations in liver enzymes, will order RUQ U/S to r/o acute hepatic/GB causes. Otherwise, blood cultures and urine cultures pending. -will obtain repeat RUQ [place] to evaluate for cholecystitis -await blood  cultures.  -await urine cultures. -Trend CBC and CMP -Respiratory panel neg for COVID, influenza and B  Acute kidney injury Pt has a baseline serum creatinine of ~1, on admission Cr 1.67. He is currently on lasix for his chronic hyponatremia and this acute kidney injury can possibly be due to hypovolemia. -Holding lasix -Received 2L IVF in ED -recheck BMP tomorrow  Chronic hyponatremia Pt has a history of chronic hyponatremia with a baseline sodium of 126-128. His sodium level is currently 128. -Continue salt tablets TID -holding lasix given AKI  Severe reflux esophagitis Pt has longstanding history of GERD. During recent admission, found to have refractory symptoms and was taken for EGD by GI where he was found to have severe reflux esophagitis. Started on carafate 1 g TID and protonix 40  mg BID at that time. Continues to have symptoms despite medical management, although better controlled than prior admission. -Administer protonix 1 hour before meals and carafate 30 mins before meals.    Hypotension Hx of Hypertension Systolic blood pressures are 80s-100s at this time. Holding all home medications.  -Received 2L IVF in ED with some improvement in BP -Bolus if MAPs <60.  OSA Pt has a history of OSA and wears CPAP nightly.  -Continue using CPAP QHS    History of PE and DVT s/p IVC filter -Continue Eliquis 5 mg QD   CAD s/p stents HLD Unremarkable echocardiogram in 2019. LDL 52 in 08/2021. -Continue atorvastatin 20 mg  History of COPD Pt is a former smoker and has a long standing history of COPD. CXR unremarkable. Starting patient on home medications.  -Continue proventil    Diet: Heart Healthy IVF: None,None VTE: Eliquis  Code: DNR PT/OT recs: Home Health, none.  This is a Careers information officer Note.  The care of the patient was discussed with Dr. Allyson Sabal and the assessment and plan was formulated with their assistance.  Please see their note for official documentation of  the patient encounter.   Signed: Claudean Kinds, Medical Student 11/16/2021, 5:25 PM    Attestation for Student Documentation:  I personally was present and performed or re-performed the history, physical exam and medical decision-making activities of this service and have verified that the service and findings are accurately documented in the students note.  Virl Axe, MD 11/16/2021, 7:01 PM

## 2021-11-16 NOTE — Progress Notes (Signed)
RT set up CPAP and placed on patient with 3L O2 bled into circuit. Patient tolerating settings well at this time. RT will monitor as needed.

## 2021-11-16 NOTE — Sepsis Progress Note (Signed)
Code sepsis protocol being monitored by eLink. 

## 2021-11-17 ENCOUNTER — Encounter (HOSPITAL_COMMUNITY): Payer: Self-pay | Admitting: Internal Medicine

## 2021-11-17 DIAGNOSIS — I700 Atherosclerosis of aorta: Secondary | ICD-10-CM | POA: Diagnosis present

## 2021-11-17 DIAGNOSIS — R4182 Altered mental status, unspecified: Secondary | ICD-10-CM | POA: Diagnosis not present

## 2021-11-17 DIAGNOSIS — J029 Acute pharyngitis, unspecified: Secondary | ICD-10-CM | POA: Diagnosis present

## 2021-11-17 DIAGNOSIS — K8020 Calculus of gallbladder without cholecystitis without obstruction: Secondary | ICD-10-CM | POA: Diagnosis present

## 2021-11-17 LAB — CBC
HCT: 31.7 % — ABNORMAL LOW (ref 39.0–52.0)
Hemoglobin: 10.7 g/dL — ABNORMAL LOW (ref 13.0–17.0)
MCH: 31.7 pg (ref 26.0–34.0)
MCHC: 33.8 g/dL (ref 30.0–36.0)
MCV: 93.8 fL (ref 80.0–100.0)
Platelets: 233 10*3/uL (ref 150–400)
RBC: 3.38 MIL/uL — ABNORMAL LOW (ref 4.22–5.81)
RDW: 12.8 % (ref 11.5–15.5)
WBC: 11.4 10*3/uL — ABNORMAL HIGH (ref 4.0–10.5)
nRBC: 0 % (ref 0.0–0.2)

## 2021-11-17 LAB — COMPREHENSIVE METABOLIC PANEL
ALT: 77 U/L — ABNORMAL HIGH (ref 0–44)
AST: 77 U/L — ABNORMAL HIGH (ref 15–41)
Albumin: 1.6 g/dL — ABNORMAL LOW (ref 3.5–5.0)
Alkaline Phosphatase: 51 U/L (ref 38–126)
Anion gap: 7 (ref 5–15)
BUN: 27 mg/dL — ABNORMAL HIGH (ref 8–23)
CO2: 18 mmol/L — ABNORMAL LOW (ref 22–32)
Calcium: 7.1 mg/dL — ABNORMAL LOW (ref 8.9–10.3)
Chloride: 108 mmol/L (ref 98–111)
Creatinine, Ser: 1.35 mg/dL — ABNORMAL HIGH (ref 0.61–1.24)
GFR, Estimated: 51 mL/min — ABNORMAL LOW (ref >60)
Glucose, Bld: 93 mg/dL (ref 70–99)
Potassium: 3.5 mmol/L (ref 3.5–5.1)
Sodium: 133 mmol/L — ABNORMAL LOW (ref 135–145)
Total Bilirubin: 1.1 mg/dL (ref 0.3–1.2)
Total Protein: 4.1 g/dL — ABNORMAL LOW (ref 6.5–8.1)

## 2021-11-17 LAB — URINE CULTURE: Culture: NO GROWTH

## 2021-11-17 LAB — RESPIRATORY PANEL BY PCR

## 2021-11-17 LAB — PROTIME-INR
INR: 2.7 — ABNORMAL HIGH (ref 0.8–1.2)
Prothrombin Time: 28.8 seconds — ABNORMAL HIGH (ref 11.4–15.2)

## 2021-11-17 NOTE — Care Management Obs Status (Signed)
[facility] NOTIFICATION   Patient Details  Name: Marcus Martin MRN: [id number] Date of Birth: 10-15-1933   Medicare Observation Status Notification Given:  Yes    Tom-Johnson, Renea Ee, RN 11/17/2021, 5:12 PM

## 2021-11-17 NOTE — Progress Notes (Addendum)
Subjective: No acute overnight events. Patient was seen at bedside during rounds this morning. Pt reports that he has had a sore throat for over one week with some slight cough. He continues to complain of diffuse body pains this AM. No other complains or concerns at this time.   Objective:  Vital signs in last 24 hours: Vitals:   11/17/21 0445 11/17/21 0515 11/17/21 0530 11/17/21 0558  BP: 108/62 (!) 111/50 (!) 123/53 (!) 114/43  Pulse: 89 87 86 88  Resp: 20 (!) 21 19 16   Temp:   97.6 F (36.4 C) 99.5 F (37.5 C)  TempSrc:   Oral Oral  SpO2: 98% 99% 100% 94%  Weight:      Height:       General physical exam Constitutional: alert, well-appearing, in no acute distress HENT: normocephalic, atraumatic, mucous membranes moist, erythematous oropharyx. Single cervical lymph node palpable on the right.  Eyes: conjunctiva non-erythematous, extraocular movements intact Neck: supple Cardiovascular: regular rate and rhythm, no m/r/g Pulmonary/Chest: normal work of breathing on 2L [place] O2, lungs clear to auscultation bilaterally Abdominal: soft, non-tender to palpation, non-distended MSK: normal bulk and tone Neurological: alert & oriented x 3  Skin: warm and dry Psych: normal behavior, normal affect   Assessment/Plan:  Principal Problem:   Altered mental status  Marcus Martin is an 87 year old male with PMH significant for SVT, h/o NSTEMI s/p PCI, Afib, COPD, HTN, HLD, h/o PE with IVC filter placement and DVT, h/o squamous cell carcinoma of the skin, GERD, peripheral neuropathy, melanoma, and bilateral hearing loss who is admitted for acute encephalopathy.   Acute encephalopathy Patient's has leukocytosis is improving to 11.4 this morning. Pt is no longer tachycardic but did have a couple episodes of tachypnea overnight. Pt does have cholelithiasis without evidence of cholecystitis on previous RUQ [place] (11/28) and endorsed diffuse abdominal tenderness on palpation yesterday. Although  exam was non-focal, given lack of clear source of infection and new elevations in liver enzymes, a RUQ U/S was ordered to r/o acute hepatic/GB causes. RUQ US showed no evidence of cholecystitis. Liver enzymes decreasing at this time.  -Blood cultures and urine do not show any growth at this time. Final results pending.  -Trend CBC and CMP -Respiratory panel neg for COVID, influenza and B. -Will obtain 20 pathogen respiratory panel to evaluate for viral infections.    Acute kidney injury Pt has a baseline serum creatinine of ~1. Serum creatinine is decreasing from 1.67 to 1.35 today. On admission, he was on lasix for his chronic hyponatremia and this acute kidney injury can possibly be due to hypovolemia. -Holding lasix -recheck BMP tomorrow   Chronic hyponatremia Pt has a history of chronic hyponatremia with a baseline sodium of 126-128. His sodium levels increased to 133 today. -Continue salt tablets TID -holding lasix given AKI   Severe reflux esophagitis Pt has longstanding history of GERD. During recent admission, found to have refractory symptoms and was taken for EGD by GI where he was found to have severe reflux esophagitis. Started on carafate 1 g TID and protonix 40 mg BID at that time. Continues to have symptoms despite medical management, although better controlled than prior admission. -Administer protonix 1 hour before meals and carafate 30 mins before meals.    Hypotension Hx of Hypertension Systolic blood pressures are 80s-100s at this time. Holding all home medications.  -Received 2L IVF in ED with some improvement in BP -Bolus if MAPs <60.   OSA Pt has a  history of OSA and wears CPAP nightly.  -Continue using CPAP QHS    History of PE and DVT s/p IVC filter -Continue Eliquis 5 mg QD   CAD s/p stents HLD Unremarkable echocardiogram in 2019. LDL 52 in 08/2021. -Continue atorvastatin 20 mg   History of COPD Pt is a former smoker and has a long standing history of  COPD. CXR unremarkable. Starting patient on home medications.  -Continue proventil   Best Practice: Diet: Heart Healthy IVF: LR, 150/hr VTE:  Eliquis Code: DNR  Disposition: Possible discharge in 1-2 days.   Signature: Marcus Martin, [facility]  Pager: [phone] 7:07 AM, 11/17/2021

## 2021-11-17 NOTE — Progress Notes (Signed)
NEW ADMISSION NOTE New Admission Note:   Arrival Method: [facility] Mental Orientation: A&O x4 Telemetry: No boxes available at this time MD made aware. Assessment: Completed Skin: Intact. Assessment completed with Emman,RN IV: 20G LAC; 20G RAC Pain: Headache 0/10 Tubes: none present Safety Measures: Safety Fall Prevention Plan has been given, discussed and signed Admission: Completed [facility] Orientation: Patient has been oriented to the room, unit and staff. Family: none present   Orders have been reviewed and implemented. Will continue to monitor the patient. Call light has been placed within reach and bed alarm has been activated.

## 2021-11-18 DIAGNOSIS — R4182 Altered mental status, unspecified: Secondary | ICD-10-CM | POA: Diagnosis not present

## 2021-11-18 LAB — CBC
HCT: 31.6 % — ABNORMAL LOW (ref 39.0–52.0)
Hemoglobin: 11 g/dL — ABNORMAL LOW (ref 13.0–17.0)
MCH: 31.4 pg (ref 26.0–34.0)
MCHC: 34.8 g/dL (ref 30.0–36.0)
MCV: 90.3 fL (ref 80.0–100.0)
Platelets: 232 10*3/uL (ref 150–400)
RBC: 3.5 MIL/uL — ABNORMAL LOW (ref 4.22–5.81)
RDW: 12.9 % (ref 11.5–15.5)
WBC: 6.8 10*3/uL (ref 4.0–10.5)
nRBC: 0 % (ref 0.0–0.2)

## 2021-11-18 LAB — BASIC METABOLIC PANEL
Anion gap: 5 (ref 5–15)
BUN: 20 mg/dL (ref 8–23)
CO2: 21 mmol/L — ABNORMAL LOW (ref 22–32)
Calcium: 7.5 mg/dL — ABNORMAL LOW (ref 8.9–10.3)
Chloride: 110 mmol/L (ref 98–111)
Creatinine, Ser: 1.04 mg/dL (ref 0.61–1.24)
GFR, Estimated: >60 mL/min (ref >60)
Glucose, Bld: 108 mg/dL — ABNORMAL HIGH (ref 70–99)
Potassium: 3.1 mmol/L — ABNORMAL LOW (ref 3.5–5.1)
Sodium: 136 mmol/L (ref 135–145)

## 2021-11-18 MED ORDER — ENSURE ENLIVE PO LIQD
237.0000 mL | Freq: Three times a day (TID) | ORAL | Status: DC
  Administered 2021-11-18 – 2021-11-19 (×2): 237 mL via ORAL

## 2021-11-18 MED ORDER — ADULT MULTIVITAMIN W/MINERALS CH
1.0000 | ORAL_TABLET | Freq: Every day | ORAL | Status: DC
  Administered 2021-11-18 – 2021-11-19 (×2): 1 via ORAL
  Filled 2021-11-18 (×2): qty 1

## 2021-11-18 MED ORDER — POTASSIUM CHLORIDE 20 MEQ PO PACK
40.0000 meq | PACK | Freq: Four times a day (QID) | ORAL | Status: AC
  Administered 2021-11-18 (×2): 40 meq via ORAL
  Filled 2021-11-18 (×2): qty 2

## 2021-11-18 NOTE — Discharge Instructions (Signed)

## 2021-11-18 NOTE — Progress Notes (Signed)
Initial Nutrition Assessment  DOCUMENTATION CODES:  Non-severe (moderate) malnutrition in context of chronic illness  INTERVENTION:  Add Ensure Plus High Protein po TID, each supplement provides 350 kcal and 20 grams of protein.   Add MVI with minerals daily.  Encourage PO and supplement intake.  NUTRITION DIAGNOSIS:  Moderate Malnutrition related to chronic illness (COPD) as evidenced by moderate fat depletion, moderate muscle depletion.  GOAL:  Patient will meet greater than or equal to 90% of their needs  MONITOR:  PO intake, Supplement acceptance, Labs, Weight trends, I & O's  REASON FOR ASSESSMENT:  Malnutrition Screening Tool    ASSESSMENT:  87 yo male with a PMH of SVT, h/o NSTEMI s/p PCI, Afib, COPD, HTN, HLD, h/o PE with IVC filter placement and DVT, h/o squamous cell carcinoma of the skin, GERD, peripheral neuropathy, melanoma, and bilateral hearing loss who is admitted for acute encephalopathy.  Spoke with pt and son at bedside. Son took phone call during RD visit.  Pt reports eating well at his residential facility. He could not tell RD the things he is brought to eat, however.  RD suspects this may not be the case, given that pt does have depletions.  Per Epic, pt eating an average of 63% of the last 4 meals (50-75%).  Pt reports his weight is decreasing, but is unsure of how much.  Of note, pt with severe LLE edema.  Not much weight history in Epic or Care Everywhere. In 06/2020, pt weighed 81.6 kg. Pt's weight has remained stable since.  Pt requested Ensure, as that is what he gets at his facility. RD to order this.  Medications: reviewed; Protonix BID, NaCl tablet TID, sucralfate QID  Labs: reviewed; K 3.1 (L), Glucose 108 (H)  NUTRITION - FOCUSED PHYSICAL EXAM: Flowsheet Row Most Recent Value  Orbital Region Moderate depletion  Upper Arm Region Mild depletion  Thoracic and Lumbar Region No depletion  Buccal Region Moderate depletion  Temple Region  Moderate depletion  Clavicle Bone Region Moderate depletion  Clavicle and Acromion Bone Region Moderate depletion  Scapular Bone Region Unable to assess  Dorsal Hand Mild depletion  Patellar Region Mild depletion  Anterior Thigh Region Mild depletion  Posterior Calf Region Mild depletion  Edema (RD Assessment) Severe  [LLE]  Hair Reviewed  Eyes Reviewed  Mouth Reviewed  Skin Reviewed  Nails Reviewed   Diet Order:   Diet Order             Diet Heart Room service appropriate? Yes; Fluid consistency: Thin  Diet effective now                  EDUCATION NEEDS:  Education needs have been addressed  Skin:  Skin Assessment: Reviewed RN Assessment  Last BM:  11/18/21  Height:  Ht Readings from Last 1 Encounters:  11/16/21 5\' 10"  (1.778 m)   Weight:  Wt Readings from Last 1 Encounters:  11/18/21 79.4 kg   BMI:  Body mass index is 25.12 kg/m.  Estimated Nutritional Needs:  Kcal:  1900-2100 Protein:  95-110 grams Fluid:  >1.9 L  Derrel Nip, RD, LDN (she/her/hers) Clinical Inpatient Dietitian RD Pager/After-Hours/Weekend Pager # in [place]

## 2021-11-18 NOTE — Progress Notes (Signed)
11/18/21 1348  PT Visit Information  Last PT Received On 11/18/21  Assistance Needed +1 (+2 for OOB mobility)  PT/OT/SLP Co-Evaluation/Treatment Yes  Reason for Co-Treatment For patient/therapist safety;To address functional/ADL transfers  PT goals addressed during session Mobility/safety with mobility;Balance  History of Present Illness pt is an 87 y/o male  presenting 12/14 from [facility] for confusion and weakness.  PMHX: NSTEMI s/p PCI, afib, COPD, HTN, PE with IVC filter, peripheral neuropathy, bil hearing loss.  Precautions  Precautions Fall  Restrictions  Weight Bearing Restrictions No  Home Living  Family/patient expects to be discharged to: Skilled nursing facility  Additional Comments Pt returning to [place] for further Rehab.  Prior Function  Prior Level of Function  Needs assist  Mobility Comments Ambulates with RW and assist from therapy staff  ADLs Comments Assist with all ADL's  Communication  Communication No difficulties  Pain Assessment  Pain Assessment No/denies pain  Cognition  Arousal/Alertness Awake/alert  Behavior During Therapy [facility] for tasks assessed/performed  Overall Cognitive Status No family/caregiver present to determine baseline cognitive functioning  General Comments HOH, pt's cog [facility] for ADLs and simple transfers. Requires increased time for all tasks.  Upper Extremity Assessment  Upper Extremity Assessment Defer to OT evaluation  Lower Extremity Assessment  Lower Extremity Assessment Generalized weakness  Cervical / Trunk Assessment  Cervical / Trunk Assessment Kyphotic  Bed Mobility  Overal bed mobility Needs Assistance  Bed Mobility Supine to Sit;Sit to Supine  Supine to sit Min assist  Sit to supine Min assist  General bed mobility comments Min A to raise up to sitting and to assist BLE back into bed on return to supine.  Transfers  Overall transfer level Needs assistance  Equipment used Rolling walker (2 wheels)   Transfers Sit to/from Stand  Sit to Stand Mod assist;Min assist;+2 safety/equipment  General transfer comment Initally on standing, requiring Mod+2 to power up, as pt pushed up, he required less assist, resulting in min A +2 to finish standing and steadying. Min A to take steps at EOB; increased SOB noted.  Balance  Overall balance assessment Needs assistance  Sitting-balance support Feet supported  Sitting balance-Leahy Scale Fair  Sitting balance - Comments No LoB with static sitting  Standing balance support Bilateral upper extremity supported;Reliant on assistive device for balance  Standing balance-Leahy Scale Poor  Standing balance comment Reliant on devices  General Comments  General comments (skin integrity, edema, etc.) Pt very dyspnic with exertion and required increased time to return to a more even breath rate  PT - End of Session  Equipment Utilized During Treatment Gait belt  Activity Tolerance Patient limited by fatigue  Patient left in bed;with call bell/phone within reach;with bed alarm set  Nurse Communication Mobility status  PT Assessment  PT Recommendation/Assessment Patient needs continued PT services  PT Visit Diagnosis Unsteadiness on feet (R26.81);Other abnormalities of gait and mobility (R26.89)  PT Problem List Decreased strength;Decreased activity tolerance;Decreased mobility;Decreased knowledge of use of DME;Decreased balance;Cardiopulmonary status limiting activity  PT Plan  PT Frequency (ACUTE ONLY) Min 3X/week  PT Treatment/Interventions (ACUTE ONLY) Gait training;Functional mobility training;Therapeutic activities;Patient/family education;Balance training;Therapeutic exercise  AM-PAC PT "6 Clicks" Mobility Outcome Measure (Version 2)  Help needed turning from your back to your side while in a flat bed without using bedrails? 3  Help needed moving from lying on your back to sitting on the side of a flat bed without using bedrails? 3  Help needed moving to  and from a bed to a chair (including a wheelchair)? 2  Help needed standing up from a chair using your arms (e.g., wheelchair or bedside chair)? 1  Help needed to walk in hospital room? 2  Help needed climbing 3-5 steps with a railing?  1  6 Click Score 12  Consider Recommendation of Discharge To: CIR/SNF/LTACH  Progressive Mobility  What is the highest level of mobility based on the progressive mobility assessment? Level 3 (Stands with assist) - Balance while standing  and cannot march in place  Mobility Out of bed to chair with meals;Out of bed for toileting  PT Recommendation  Follow Up Recommendations Skilled nursing-short term rehab (<3 hours/day)  Assistance recommended at discharge Frequent or constant Supervision/Assistance  Functional Status Assessment Patient has had a recent decline in their functional status and demonstrates the ability to make significant improvements in function in a reasonable and predictable amount of time.  PT equipment None recommended by PT  Individuals Consulted  Consulted and Agree with Results and Recommendations Patient  Acute Rehab PT Goals  Patient Stated Goal to get stronger  PT Goal Formulation With patient  Time For Goal Achievement 12/02/21  Potential to Achieve Goals Good  PT Time Calculation  PT Start Time (ACUTE ONLY) 1250  PT Stop Time (ACUTE ONLY) 1305  PT Time Calculation (min) (ACUTE ONLY) 15 min  PT General Charges  $$ ACUTE PT VISIT 1 Visit  PT Evaluation  $PT Eval Moderate Complexity 1 Mod  Written Expression  Dominant Hand Right    Pt admitted secondary to problem above with deficits above. Pt requiring min to mod A +2 to stand and min A to take steps at EOB. Increased SOB noted throughout. Pt currently receiving therapies at SNF and recommend return at d/c. Will continue to follow acutely.   Reuel Derby, PT, DPT  Acute Rehabilitation Services  Pager: [phone] Office: [phone]

## 2021-11-18 NOTE — Discharge Summary (Addendum)
Name: Marcus Martin MRN: [id number] DOB: October 27, 1933 87 y.o. PCP: Curly Rim, MD  Date of Admission: 11/16/2021 11:54 AM Date of Discharge: 11/19/2021 Attending Physician: Aldine Contes, MD  Discharge Diagnosis: 1. Acute encephalopathy 2/2 viral infection 2. Acute Kidney Injury 3. COPD 4. Severe reflux esophagitis  5. Chronic hyponatremia  Discharge Medications: Allergies as of 11/19/2021       Reactions   Ticagrelor Other (See Comments)   "GI Hemorrhage"   Tetanus Toxoids Other (See Comments)   "Horse serum only"   Coffea Arabica    Other reaction(s): Other (See Comments) DECAF coffee causes vision changes per pt. DECAF coffee causes vision changes per pt.   Niacin    Other reaction(s): Flushing (disorder), Other (See Comments) [facility] but [facility] can not remember exact reaction [facility] but patient can not remember exact reaction   Niacin And Related Other (See Comments)   Flushing   Niacin Er Other (See Comments)   Flushing   Simvastatin Other (See Comments)   "Muscle pain" Other reaction(s): Muscle pain Other reaction(s): Muscle pain (finding), Muscle pain (finding), Muscle pain (finding), Muscle pain (finding), Other (See Comments)   Tiotropium    Other reaction(s): Retention of urine        Medication List     STOP taking these medications    furosemide 20 MG tablet Commonly known as: LASIX       TAKE these medications    acetaminophen 500 MG tablet Commonly known as: TYLENOL Take 500 mg by mouth 3 (three) times daily as needed for mild pain, headache or fever.   albuterol 108 (90 Base) MCG/ACT inhaler Commonly known as: VENTOLIN HFA Inhale 2 puffs into the lungs every 6 (six) hours as needed for wheezing or shortness of breath.   amLODipine 5 MG tablet Commonly known as: NORVASC Take 1 tablet (5 mg total) by mouth daily.   apixaban 5 MG Tabs tablet Commonly known as: ELIQUIS Take 5 mg by mouth 2 (two) times  daily.   atorvastatin 20 MG tablet Commonly known as: LIPITOR Take 20 mg by mouth every evening.   B COMPLEX PO Take 1 tablet by mouth daily.   Baclofen 5 MG Tabs Take 5 mg by mouth 2 (two) times daily as needed (muscle spasms).   CALCIUM 500/VITAMIN D PO Take 1 tablet by mouth daily.   Ensure Take 237 mLs by mouth in the morning and at bedtime.   ferrous sulfate 325 (65 FE) MG tablet Take 325 mg by mouth every Monday, Wednesday, and Friday.   gabapentin 300 MG capsule Commonly known as: NEURONTIN Take 600 mg by mouth at bedtime. Also has 400 mg dosage for daytime   gabapentin 400 MG capsule Commonly known as: NEURONTIN Take 400 mg by mouth in the morning and at bedtime. Also has 300 mg dose for bedtime   latanoprost 0.005 % ophthalmic solution Commonly known as: XALATAN Place 1 drop into both eyes at bedtime.   lisinopril 40 MG tablet Commonly known as: ZESTRIL Take 1 tablet (40 mg total) by mouth daily.   LUTEIN PO Take 1 capsule by mouth daily.   multivitamin tablet Take 1 tablet by mouth daily.   pantoprazole 40 MG tablet Commonly known as: PROTONIX Take 1 tablet (40 mg total) by mouth 2 (two) times daily before a meal.   polyvinyl alcohol 1.4 % ophthalmic solution Commonly known as: LIQUIFILM TEARS Place 1 drop into both eyes daily as needed for dry eyes.  PROBIOTIC PO Take 1 capsule by mouth daily.   silver sulfADIAZINE 1 % cream Commonly known as: SILVADENE Apply 1 application topically in the morning and at bedtime.   Simbrinza 1-0.2 % Susp Generic drug: Brinzolamide-Brimonidine Place 1 drop into both eyes in the morning and at bedtime.   sodium chloride 1 g tablet Take 1 tablet (1 g total) by mouth 3 (three) times daily with meals.   sucralfate 1 GM/10ML suspension Commonly known as: CARAFATE Take 10 mLs (1 g total) by mouth 4 (four) times daily -  with meals and at bedtime.   tamsulosin 0.4 MG Caps capsule Commonly known as:  FLOMAX Take 0.4 mg by mouth at bedtime. Hold for SBP <110   triamcinolone 0.025 % cream Commonly known as: KENALOG Apply 1 application topically 2 (two) times daily.   Triple Antibiotic Oint Place 1 application onto the skin 2 (two) times daily as needed (minor wounds/cuts).   vitamin C 500 MG tablet Commonly known as: ASCORBIC ACID Take 500 mg by mouth daily.   Vitamin D3 25 MCG (1000 UT) Caps Take 1,000 Units by mouth daily.        Disposition and follow-up:   Marcus Martin was discharged from [facility] in Stable condition.  At the hospital follow up visit please address:  1.  Follow up with PCP in one to two weeks. Pt's potassium levels were repleted orally prior to discharge. Monitor potassium levels on follow up visit.   2.  Labs / imaging needed at time of follow-up: Please do a BMP and follow up on potassium levels.  3.  Pending labs/ test needing follow-up: NA  Follow-up Appointments: PCP appointment for hospital follow up.    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 Acute encephalopathy secondary to viral infection. Pt reports that he has felt unwell for one week with myalgias and a sore throat and intermittent SOB. His condition acutely worsened two days ago with a recorded fever of 102.9, leukocytosis, SOB requiring 2L of O2 via [place]. A full infectious workup was done which showed an unremarkable CXR, UA, urine and blood cultures, RUQ [place], CT abdomen/pelvis. A respiratory panel was obtained which was unremarkable for any known pathogens, and COVID and influenza A/B tests were negative. Pt's symptoms and physical exam significant for cervical adenopathy, erythema without exudates of the oropharynx, history of  worsening SOB, fever and myalgias are congruent with a viral infection. He has been doing well with supportive care. Discharging today to [facility].  2.Acute kidney injury likely secondary to furosemide use in the setting of an acute viral illness. Pt presented to the ED with Cr of 1.67. He was given NS fluids and his serum creatinine improved to 1.04 today.  3. COPD. Pt did not have an exacerbation of COPD during this admission but was given 2L of oxygen via  for supportive care. He was also continued on Proventil which have been helping him.  4.Severe reflux esophagitis. Patient was continued on his home medications (protonix, carafate) and although he initially complained of dyspepsia, his symptoms were controlled with continuation of his home medications.  5. Chronic hyponatremia. Pt is chronically hyponatremic with sodium level of ~128 and presented to the hospital with  the a sodium of 128. With NS infusion, his sodium levels have normalized to 137 today.  Discharge HPI: Today pt reports that he feels much better than previous days, noting that his sore throat, myalgias and SOB have improved significantly. He notes that he has been able to eat well and has had a BM today. Pt denies fevers, chills, abdominal pain, or dysuria.  Discharge Exam:   BP (!) 143/60 (BP Location: Left Arm)    Pulse 89    Temp 98.5 F (36.9 C) (Axillary)    Resp 17    Ht 5\' 10"  (1.778 m)    Wt 79.4 kg    SpO2 99%    BMI 25.12 kg/m  Discharge exam: Physical Exam Vitals and nursing note reviewed.  Constitutional:      Appearance: He is obese.  HENT:     Head: Normocephalic and atraumatic.     Nose: Nose normal.     Mouth/Throat:     Mouth: Mucous membranes are moist.     Pharynx: Oropharynx is clear.  Eyes:     Pupils: Pupils are equal, round, and reactive to light.  Cardiovascular:     Rate and Rhythm: Normal rate and regular rhythm.     Pulses: Normal pulses.     Heart sounds: Normal heart sounds.   Pulmonary:     Effort: Pulmonary effort is normal.     Breath sounds: Normal breath sounds.  Abdominal:     General: Bowel sounds are normal.  Musculoskeletal:        General: No swelling. Normal range of motion.     Cervical back: Normal range of motion.  Skin:    General: Skin is warm and dry.     Capillary Refill: Capillary refill takes less than 2 seconds.  Neurological:     General: No focal deficit present.     Mental Status: He is alert and oriented to person, place, and time. Mental status is at baseline.  Psychiatric:        Mood and Affect: Mood normal.        Behavior: Behavior normal.     Pertinent Labs, Studies, and Procedures:   CBC    Component Value Date/Time   WBC 6.8 11/18/2021 0301   RBC 3.50 (L) 11/18/2021 0301   HGB 11.0 (L) 11/18/2021 0301   HCT 31.6 (L) 11/18/2021 0301   PLT 232 11/18/2021 0301   MCV 90.3 11/18/2021 0301   MCH 31.4 11/18/2021 0301   MCHC 34.8 11/18/2021 0301   RDW 12.9 11/18/2021 0301   LYMPHSABS 1.1 11/16/2021 1250   MONOABS 1.0 11/16/2021 1250   EOSABS 0.0 11/16/2021 1250   BASOSABS 0.1 11/16/2021 1250     CMP     Component Value Date/Time   NA 137 11/19/2021 0729   K 4.0 11/19/2021 0729   CL 109 11/19/2021 0729   CO2 20 (L) 11/19/2021 0729   GLUCOSE 111 (H) 11/19/2021 0729   BUN 17 11/19/2021 0729   CREATININE 0.92 11/19/2021 0729   CALCIUM 7.9 (L) 11/19/2021 0729   PROT 4.1 (L) 11/17/2021 0355   ALBUMIN 1.6 (L) 11/17/2021 0355   AST 77 (H) 11/17/2021 0355   ALT 77 (H) 11/17/2021 0355   ALKPHOS 51 11/17/2021 0355   BILITOT 1.1 11/17/2021 0355   GFRNONAA >60 11/19/2021 0729     DG Chest 1 View  Result Date: 10/31/2021 CLINICAL DATA:  Possible unwitnessed fall, forehead hematoma, trauma EXAM: CHEST  1 VIEW COMPARISON:  None. FINDINGS: Mild cardiomegaly and chronic vascular congestion. Negative for pneumonia, edema, effusion, or pneumothorax. Old posterior rib fractures bilaterally. Degenerative changes of the  spine. Bones are osteopenic. Aorta atherosclerotic. IMPRESSION: Cardiomegaly and vascular congestion. No acute finding by plain radiography Aortic atherosclerosis Electronically Signed   By: Jerilynn Mages.  Shick M.D.   On: 10/31/2021 13:13   DG Pelvis 1-2 Views  Result Date: 10/31/2021 CLINICAL DATA:  Unwitnessed fall, trauma, possible injury EXAM: PELVIS - 1-2 VIEW COMPARISON:  None. FINDINGS: Bony pelvis and hips appear symmetric and intact. No acute osseous finding or displaced fracture. Degenerative changes of the lumbosacral spine, SI joints and both hips. Aortoiliac and femoral atherosclerosis present. Nonobstructive bowel gas pattern. IMPRESSION: No acute finding by plain radiography. Electronically Signed   By: Jerilynn Mages.  Shick M.D.   On: 10/31/2021 13:15   DG ELBOW COMPLETE LEFT (3+VIEW)  Result Date: 10/31/2021 CLINICAL DATA:  Arm pain EXAM: LEFT ELBOW - COMPLETE 3+ VIEW COMPARISON:  None. FINDINGS: There is no evidence of fracture, dislocation, or joint effusion. There is no evidence of arthropathy or other focal bone abnormality. Soft tissues are unremarkable. IMPRESSION: Negative. Electronically Signed   By: Donavan Foil M.D.   On: 10/31/2021 17:45   DG Abd 1 View  Result Date: 11/01/2021 CLINICAL DATA:  Abdominal distension EXAM: ABDOMEN - 1 VIEW COMPARISON:  None. FINDINGS: There is no evidence of bowel obstruction. There is an IVC filter overlying the mid abdomen. There are no radiopaque calculi overlying the kidneys or course of the ureters. Moderate rectal stool burden. No acute osseous abnormality. IMPRESSION: No evidence of bowel obstruction.  Moderate rectal stool burden. Electronically Signed   By: Maurine Simmering M.D.   On: 11/01/2021 11:36   CT HEAD WO CONTRAST  Result Date: 10/31/2021 CLINICAL DATA:  Unwitnessed fall, altered mental status EXAM: CT HEAD WITHOUT CONTRAST TECHNIQUE: Contiguous axial images were obtained from the base of the skull through the vertex without intravenous contrast.  COMPARISON:  None. FINDINGS: Brain: Brain atrophy and advanced white matter microvascular changes throughout both cerebral hemispheres. No acute intracranial hemorrhage, mass lesion, new infarction, midline shift, herniation hydrocephalus, or extra-axial fluid collection. No focal mass effect or edema. Cisterns are patent. No cerebellar abnormality. Vascular: Intracranial atherosclerosis.  No hyperdense vessel. Skull: Remote suboccipital craniectomy. No other acute osseous finding. Mastoids are clear. Sinuses/Orbits: No acute finding. Other: None. IMPRESSION: Atrophy and advanced white matter microvascular ischemic changes. No acute intracranial abnormality by noncontrast CT. Remote suboccipital craniectomy. Electronically Signed   By: Jerilynn Mages.  Shick M.D.   On: 10/31/2021 13:24   CT CERVICAL SPINE WO CONTRAST  Result Date: 10/31/2021 CLINICAL DATA:  Unwitnessed fall, altered mental status EXAM: CT CERVICAL SPINE WITHOUT CONTRAST TECHNIQUE: Multidetector CT imaging of the cervical spine was performed without intravenous contrast. Multiplanar CT image reconstructions were also generated. COMPARISON:  None. FINDINGS: Alignment: No subluxation dislocation. Skull base and vertebrae: No acute osseous finding or fracture. Bones are osteopenic. C1 arch is incomplete both anterior and posteriorly. Soft tissues and spinal canal: No prevertebral fluid or swelling. No visible canal hematoma. Disc levels: Degenerative changes throughout the cervical spine. Previous anterior cervical discectomy and fusion at C5-6. Solid fusion at the surgical level. Degenerative disc disease most pronounced at C4-5 and C6-7 with marked disc space narrowing, sclerosis and osteophytes. Multilevel posterior facet arthropathy, worse on the right. Facets are aligned. No subluxation or dislocation appreciated. Upper chest: Negative. Other: None. IMPRESSION: No acute cervical spine fracture or malalignment by CT. Osteopenia, degenerative  changes and  previous ACDF at C5-6. Electronically Signed   By: Jerilynn Mages.  Shick M.D.   On: 10/31/2021 13:21   MR BRAIN WO CONTRAST  Result Date: 10/31/2021 CLINICAL DATA:  Encephalopathy EXAM: MRI HEAD WITHOUT CONTRAST TECHNIQUE: Multiplanar, multiecho pulse sequences of the brain and surrounding structures were obtained without intravenous contrast. COMPARISON:  None. FINDINGS: Brain: No acute infarct, mass effect or extra-axial collection. No acute or chronic hemorrhage. Hyperintense T2-weighted signal is moderately widespread throughout the white matter. Generalized volume loss without a clear lobar predilection. Old small vessel infarcts of the left basal ganglia and right cerebellum. The midline structures are normal. Vascular: Major flow voids are preserved. Skull and upper cervical spine: Normal calvarium and skull base. Visualized upper cervical spine and soft tissues are normal. Sinuses/Orbits:No paranasal sinus fluid levels or advanced mucosal thickening. No mastoid or middle ear effusion. Normal orbits. IMPRESSION: 1. No acute intracranial abnormality. 2. Generalized volume loss and findings of chronic ischemic microangiopathy. Electronically Signed   By: Ulyses Jarred M.D.   On: 10/31/2021 21:25   DG Chest Portable 1 View  Result Date: 11/16/2021 CLINICAL DATA:  sob EXAM: PORTABLE CHEST 1 VIEW COMPARISON:  November 06, 2021. FINDINGS: No consolidation. No visible pleural effusions or pneumothorax. Similar cardiomediastinal silhouette. Calcific atherosclerosis aorta healing right fourth and fifth rib fractures. ACDF. IMPRESSION: No evidence of acute cardiopulmonary disease.  No change. Electronically Signed   By: Margaretha Sheffield M.D.   On: 11/16/2021 14:51   DG CHEST PORT 1 VIEW  Result Date: 11/06/2021 CLINICAL DATA:  Shortness of breath. EXAM: PORTABLE CHEST 1 VIEW COMPARISON:  October 31, 2021 FINDINGS: Tortuosity and calcific atherosclerotic disease of the aorta. Cardiomediastinal silhouette is normal.  Mediastinal contours appear intact. There is no evidence of focal airspace consolidation, pleural effusion or pneumothorax. Age indeterminate fractures of the posterior fourth and fifth ribs. Soft tissues are grossly normal. IMPRESSION: 1. No evidence of acute cardiopulmonary disease. 2. Age indeterminate fractures of the right posterior fourth and fifth ribs. Electronically Signed   By: Fidela Salisbury M.D.   On: 11/06/2021 12:16   CT CHEST ABDOMEN PELVIS WO CONTRAST  Result Date: 11/16/2021 CLINICAL DATA:  Altered mental status, weakness, acute abdominal pain, hyponatremia, abdominal distension. History COPD, DVT, GERD, hypertension, melanoma, NSTEMI, former smoker EXAM: CT CHEST, ABDOMEN AND PELVIS WITHOUT CONTRAST TECHNIQUE: Multidetector CT imaging of the chest, abdomen and pelvis was performed following the standard protocol without IV contrast. COMPARISON:  None FINDINGS: CT CHEST FINDINGS Cardiovascular: Atherosclerotic calcifications aorta, proximal great vessels and coronary arteries. Aorta normal caliber. Heart size normal. No pericardial effusion. Mediastinum/Nodes: Small hiatal hernia. No thoracic adenopathy. Calcified subcarinal and RIGHT hilar lymph nodes. Base of cervical region normal appearance. Lungs/Pleura: Scattered subpleural interstitial changes. Minimal fluid or thickening of LEFT major fissure. Calcified granuloma RIGHT lower lobe. No acute infiltrate, mass, or pneumothorax. Musculoskeletal: Diffuse osseous demineralization. Prior cervical spine fusion. CT ABDOMEN PELVIS FINDINGS Hepatobiliary: Dependent calculi within gallbladder. Liver unremarkable. Pancreas: Atrophic pancreas without mass Spleen: Normal appearance Adrenals/Urinary Tract: Adrenal glands and RIGHT kidney normal appearance. Exophytic cyst upper pole LEFT kidney 2.5 x 2.2 cm. No urinary tract calcification or dilatation. Bladder unremarkable. Stomach/Bowel: Diverticulosis of distal descending and sigmoid colon  without evidence of diverticulitis. Normal appendix. Stomach and bowel loops otherwise normal appearance. Vascular/Lymphatic: IVC filter. Atherosclerotic calcifications aorta, iliac arteries, origins of celiac and superior mesenteric arteries. Aorta normal caliber. No adenopathy. Reproductive: Unremarkable prostate gland and seminal vesicles Other: No free air or free fluid.  No hernia. Musculoskeletal: Old appearing superior endplate compression fracture of L3. IMPRESSION: Old granulomatous disease. Cholelithiasis. Distal colonic diverticulosis without evidence of diverticulitis. Small hiatal hernia. No acute intrathoracic, intra-abdominal or intrapelvic abnormalities. Aortic Atherosclerosis (ICD10-I70.0). Electronically Signed   By: Lavonia Dana M.D.   On: 11/16/2021 15:20   US Abdomen Limited RUQ (LIVER/GB)  Result Date: 11/16/2021 CLINICAL DATA:  Cholelithiasis EXAM: ULTRASOUND ABDOMEN LIMITED RIGHT UPPER QUADRANT COMPARISON:  CT scan, same date. FINDINGS: Gallbladder: Small echogenic shadowing gallstones are noted. The largest gallstone measures 1.1 cm. No gallbladder wall thickening, pericholecystic fluid or sonographic Murphy sign to suggest acute cholecystitis. Common bile duct: Diameter: 4.0 mm Liver: Normal echogenicity without focal lesion or biliary dilatation. Portal vein is patent on color Doppler imaging with normal direction of blood flow towards the liver. Other: None. IMPRESSION: 1. Cholelithiasis without sonographic findings for acute cholecystitis. 2. No intra or extrahepatic biliary dilatation. Electronically Signed   By: Marijo Sanes M.D.   On: 11/16/2021 19:55   US Abdomen Limited RUQ (LIVER/GB)  Result Date: 10/31/2021 CLINICAL DATA:  Abdominal distension. EXAM: ULTRASOUND ABDOMEN LIMITED RIGHT UPPER QUADRANT COMPARISON:  None. FINDINGS: Gallbladder: Multiple stones within the gallbladder. No gallbladder wall thickening or pericholecystic fluid. Negative sonographic Murphy's sign.  Common bile duct: Diameter: 5 mm Liver: No focal lesion identified. Within normal limits in parenchymal echogenicity. Portal vein is patent on color Doppler imaging with normal direction of blood flow towards the liver. Other: None. IMPRESSION: Cholelithiasis without sonographic evidence of acute cholecystitis. Electronically Signed   By: Anner Crete M.D.   On: 10/31/2021 21:39     Discharge Instructions: Discharge Instructions     Diet general   Complete by: As directed    Increase activity slowly   Complete by: As directed        Signed: Virl Axe, MD 11/19/2021, 10:22 AM

## 2021-11-18 NOTE — TOC Progression Note (Addendum)
Transition of Care [facility]) - Initial/Assessment Note    Patient Details  Name: Marcus Martin MRN: [id number] Date of Birth: 1933-01-04  [facility] CM/SW Contact:    Milinda Antis, [place] Phone Number: 11/18/2021, 10:13 AM  Clinical Narrative:                 CSW received notification from the attending that the patient is stable to d/c.  CSW contacted [facility] and spoke with Elyse Hsu.  The patient can return to the facility today.  11:35-  CSW received a returned call from Elyse Hsu with the facility and was informed that the patient was receiving ST rehab and the facility and will need insurance authorization again.  CSW contacted attending and requested that PT/OT orders be put in so that insurance auth can be requested prior to the patient returning to the facility.    14:15-  CSW spoke with the patient's son and updated on the status for SNF insurance authorization.  The son stated that he would update the patient.  Pending: insurance auth.  (It has been initiated)   Patient Goals and CMS Choice        Expected Discharge Plan and Services                                                Prior Living Arrangements/Services                       Activities of Daily Living Home Assistive Devices/Equipment: None ADL Screening (condition at time of admission) Patient's cognitive ability adequate to safely complete daily activities?: Yes Is the patient deaf or have difficulty hearing?: Yes Does the patient have difficulty seeing, even when wearing glasses/contacts?: No Does the patient have difficulty concentrating, remembering, or making decisions?: No Patient able to express need for assistance with ADLs?: Yes Does the patient have difficulty dressing or bathing?: Yes Independently performs ADLs?: No Communication: Independent Dressing (OT): Needs assistance Is this a change from baseline?: Pre-admission baseline Grooming:  Needs assistance Is this a change from baseline?: Pre-admission baseline Feeding: Independent Bathing: Needs assistance Is this a change from baseline?: Pre-admission baseline Toileting: Needs assistance Is this a change from baseline?: Pre-admission baseline In/Out Bed: Needs assistance Is this a change from baseline?: Pre-admission baseline Walks in Home: Needs assistance Is this a change from baseline?: Pre-admission baseline Does the patient have difficulty walking or climbing stairs?: Yes Weakness of Legs: Both Weakness of Arms/Hands: Both  Permission Sought/Granted                  Emotional Assessment              Admission diagnosis:  Altered mental status [R41.82] Cholelithiasis [K80.20] Altered mental status, unspecified altered mental status type [R41.82] Sepsis with acute renal failure, due to unspecified organism, unspecified acute renal failure type, unspecified whether septic shock present ([place]) [A41.9, R65.20, N17.9] Patient Active Problem List   Diagnosis Date Noted   Cholelithiasis without cholecystitis 11/17/2021   Aortic atherosclerosis ([place]) 11/17/2021   Acute sore throat 11/17/2021   Altered mental status 11/16/2021   SIADH (syndrome of inappropriate ADH production) ([place])    Gastroesophageal reflux disease with esophagitis without hemorrhage    COPD exacerbation ([place])    Abdominal distention    Fall  Hyponatremia    Acute encephalopathy 10/31/2021   PCP:  Curly Rim, MD Pharmacy:   [place], [place] - [address] [place] [address] Phone: [phone] Fax: [phone]     Social Determinants of Health (SDOH) Interventions    Readmission Risk Interventions No flowsheet data found.

## 2021-11-18 NOTE — Progress Notes (Signed)
Subjective: No acute overnight events. Today pt reports that he feels much better than previous days, noting that his sore throat, myalgias and SOB have improved significantly. He notes that he has been able to eat well and has had a BM today. Pt denies fevers, chills, abdominal pain, or dysuria.  Objective:  Vital signs in last 24 hours: Vitals:   11/17/21 2055 11/18/21 0023 11/18/21 0432 11/18/21 0911  BP: 140/62  (!) 143/60 138/63  Pulse: 100  89 90  Resp: 20  17 18   Temp: 98.5 F (36.9 C)  98.5 F (36.9 C) 97.9 F (36.6 C)  TempSrc: Oral  Axillary Oral  SpO2: 98%  99% 100%  Weight:  79.4 kg    Height:       Physical exam Constitutional: alert, well-appearing, in no acute distress HENT: normocephalic, atraumatic, mucous membranes moist, erythematous oropharyx. Single cervical adenopathy present.  Eyes: conjunctiva non-erythematous, extraocular movements intact Neck: supple Cardiovascular: regular rate and rhythm, no m/r/g Pulmonary/Chest: normal work of breathing on 2L [place] O2, lungs clear to auscultation bilaterally Abdominal: soft, non-tender to palpation, non-distended MSK: normal bulk and tone Neurological: alert & oriented x 3  Skin: warm and dry Psych: normal behavior, normal affect   Assessment/Plan:  Principal Problem:   Altered mental status Active Problems:   Hyponatremia   Cholelithiasis without cholecystitis   Aortic atherosclerosis (HCC)   Acute sore throat  Marcus Martin is an 87 year old male with PMH significant for SVT, h/o NSTEMI s/p PCI, Afib, COPD, HTN, HLD, h/o PE with IVC filter placement and DVT, h/o squamous cell carcinoma of the skin, GERD, peripheral neuropathy, melanoma, and bilateral hearing loss who is admitted for acute encephalopathy.   Acute encephalopathy 2/2 viral infection Patient's has leukocytosis improved to 6.8 this morning. Pt's COVID, Influenza A/B and 20 pathogen respiratory panel are all negative at this time. However, as  patient is exhibiting myalgias, sore throat, cervical lymphadenopathy, erythematous oropharynx (which are all improving), pt likely has a viral infection at this time. -Blood cultures and urine do not show any growth at this time.   Acute kidney injury Pt has a baseline serum creatinine of ~1. Serum creatinine is decreasing from 1.35 to 1.04 today. On admission, he was on lasix for his chronic hyponatremia and this acute kidney injury can possibly be due to hypovolemia. -Holding lasix -recheck BMP tomorrow   Chronic hyponatremia Pt has a history of chronic hyponatremia with a baseline sodium of 126-128. His sodium levels increased to 136 today. -Continue salt tablets TID -holding lasix given AKI   Severe reflux esophagitis Pt has longstanding history of GERD. During recent admission, found to have refractory symptoms and was taken for EGD by GI where he was found to have severe reflux esophagitis. Started on carafate 1 g TID and protonix 40 mg BID at that time. Continues to have symptoms despite medical management, although better controlled than prior admission. -Administer protonix 1 hour before meals and carafate 30 mins before meals.    Hypotension-resolved Hx of Hypertension Systolic blood pressures are 130-140s at this time. Holding all home medications.  -resuming amlodipine 5 mg QD   OSA Pt has a history of OSA and wears CPAP nightly.  -Continue using CPAP QHS    History of PE and DVT s/p IVC filter -Continue Eliquis 5 mg QD   CAD s/p stents HLD Unremarkable echocardiogram in 2019. LDL 52 in 08/2021. -Continue atorvastatin 20 mg   History of COPD Pt is a  former smoker and has a long standing history of COPD. CXR unremarkable. Starting patient on home medications.  -On 2L of O2 via [place]  -Continue proventil     Best Practice: Diet: Heart Healthy IVF: LR, 150/hr VTE:  Eliquis Code: DNR   Disposition: Possible discharge in 1-2 days.   Signature: Claudean Kinds, [facility]  Pager: [phone] 3:58 PM, 11/18/2021

## 2021-11-18 NOTE — Evaluation (Signed)
Occupational Therapy Evaluation Patient Details Name: Marcus Martin MRN: [id number] DOB: 02-21-1933 Today's Date: 11/18/2021   History of Present Illness pt is an 87 y/o male,presenting 12/14 from [facility] for confusion and weakness.  PMHX: NSTEMI s/p PCI, afib, COPD, HTN, PE with IVC filter, peripheral neuropathy, bil hearing loss.   Clinical Impression   Pt admitted for concerns listed above. PTA pt was at [facility] receiving skilled therapy to improve independence and return to [place]. At this time, pt presents with increased weakness, balance deficits, and decreased activity tolerance. He requires mod-min A to power up to standing and steady, benefits from +2 for safety with all OOB mobility. Pt also is requiring Set up-mod A +1-2 for all ADL's at this time. Recommend returning to SNF to maximize his return to independence. Acute OT will follow to address concerns listed below.      Recommendations for follow up therapy are one component of a multi-disciplinary discharge planning process, led by the attending physician.  Recommendations may be updated based on patient status, additional functional criteria and insurance authorization.   Follow Up Recommendations  Skilled nursing-short term rehab (<3 hours/day)    Assistance Recommended at Discharge Frequent or constant Supervision/Assistance  Functional Status Assessment  Patient has had a recent decline in their functional status and demonstrates the ability to make significant improvements in function in a reasonable and predictable amount of time.  Equipment Recommendations  None recommended by OT    Recommendations for Other Services       Precautions / Restrictions Precautions Precautions: Fall Restrictions Weight Bearing Restrictions: No      Mobility Bed Mobility Overal bed mobility: Needs Assistance Bed Mobility: Supine to Sit;Sit to Supine     Supine to sit: Min assist Sit to supine:  Min assist   General bed mobility comments: Min A to raise up to sitting and to assist BLE back into bed on return to supine.    Transfers Overall transfer level: Needs assistance Equipment used: Rolling walker (2 wheels) Transfers: Sit to/from Stand Sit to Stand: Mod assist;Min assist;+2 safety/equipment           General transfer comment: Initally on standing, requiring Mod+2 to power up, as pt pushed up, he required less assist, resulting in min A +2 to finish standing and steadying.      Balance Overall balance assessment: Needs assistance Sitting-balance support: Feet supported Sitting balance-Leahy Scale: Fair Sitting balance - Comments: No LoB with static sitting   Standing balance support: Bilateral upper extremity supported;Reliant on assistive device for balance Standing balance-Leahy Scale: Poor Standing balance comment: Reliant on devices                           ADL either performed or assessed with clinical judgement   ADL Overall ADL's : Needs assistance/impaired Eating/Feeding: Set up;Sitting   Grooming: Set up;Sitting   Upper Body Bathing: Minimal assistance;Sitting   Lower Body Bathing: Moderate assistance;+2 for safety/equipment;Sitting/lateral leans;Sit to/from stand   Upper Body Dressing : Set up;Sitting   Lower Body Dressing: Maximal assistance;+2 for safety/equipment;Sitting/lateral leans;Sit to/from stand   Toilet Transfer: Moderate assistance;+2 for safety/equipment;Stand-pivot   Toileting- Clothing Manipulation and Hygiene: Moderate assistance;+2 for safety/equipment;Sitting/lateral lean;Sit to/from stand       Functional mobility during ADLs: Moderate assistance;Minimal assistance;Rolling walker (2 wheels);+2 for safety/equipment General ADL Comments: pt required increased time for all tasks this session. SOB throughout     Vision  Baseline Vision/History: 1 Wears glasses Ability to See in Adequate Light: 0  Adequate Patient Visual Report: No change from baseline Vision Assessment?: No apparent visual deficits     Perception     Praxis      Pertinent Vitals/Pain Pain Assessment: No/denies pain     Hand Dominance Right   Extremity/Trunk Assessment Upper Extremity Assessment Upper Extremity Assessment: Generalized weakness;LUE deficits/detail LUE Deficits / Details: 4-/5 MMT in grip and poor coordination LUE Sensation: decreased light touch LUE Coordination: decreased fine motor;decreased gross motor   Lower Extremity Assessment Lower Extremity Assessment: Defer to PT evaluation   Cervical / Trunk Assessment Cervical / Trunk Assessment: Kyphotic   Communication Communication Communication: No difficulties   Cognition Arousal/Alertness: Awake/alert Behavior During Therapy: WFL for tasks assessed/performed Overall Cognitive Status: No family/caregiver present to determine baseline cognitive functioning                                 General Comments: HOH, pt's cog WFL for ADLs and simple transfers. Requires increased time for all tasks.     General Comments  Pt very dyspnic with exertion and required increased time to return to a more even breath rate    Exercises     Shoulder Instructions      Home Living Family/patient expects to be discharged to:: Skilled nursing facility                                 Additional Comments: Pt returning to [facility] for further Rehab.      Prior Functioning/Environment Prior Level of Function : Needs assist             Mobility Comments: Ambulates with RW and assist from therapy staff ADLs Comments: Assist with all ADL's        OT Problem List: Decreased strength;Decreased activity tolerance;Impaired balance (sitting and/or standing);Decreased safety awareness;Decreased knowledge of use of DME or AE;Decreased knowledge of precautions;Decreased cognition      OT  Treatment/Interventions: Self-care/ADL training;Therapeutic exercise;Neuromuscular education;Energy conservation;DME and/or AE instruction;Manual therapy;Cognitive remediation/compensation;Therapeutic activities;Patient/family education;Balance training    OT Goals(Current goals can be found in the care plan section) Acute Rehab OT Goals Patient Stated Goal: To return to rehab OT Goal Formulation: With patient Time For Goal Achievement: 12/02/21 Potential to Achieve Goals: Good ADL Goals Pt Will Perform Grooming: with min guard assist;standing Pt Will Perform Lower Body Bathing: with min guard assist;sitting/lateral leans;sit to/from stand Pt Will Perform Lower Body Dressing: with min guard assist;sit to/from stand;sitting/lateral leans Pt Will Transfer to Toilet: with min guard assist;ambulating Pt Will Perform Toileting - Clothing Manipulation and hygiene: with min guard assist;sitting/lateral leans;sit to/from stand  OT Frequency: Min 2X/week   Barriers to D/C:            Co-evaluation PT/OT/SLP Co-Evaluation/Treatment: Yes Reason for Co-Treatment: For patient/therapist safety;To address functional/ADL transfers   OT goals addressed during session: ADL's and self-care;Proper use of Adaptive equipment and DME      AM-PAC OT "6 Clicks" Daily Activity     Outcome Measure Help from another person eating meals?: A Little Help from another person taking care of personal grooming?: A Little Help from another person toileting, which includes using toliet, bedpan, or urinal?: A Lot Help from another person bathing (including washing, rinsing, drying)?: A Lot Help from another person to put on and  taking off regular upper body clothing?: A Little Help from another person to put on and taking off regular lower body clothing?: A Lot 6 Click Score: 15   End of Session Equipment Utilized During Treatment: Gait belt;Rolling walker (2 wheels);Oxygen Nurse Communication: Mobility  status  Activity Tolerance: Patient tolerated treatment well Patient left: in bed;with call bell/phone within reach;with bed alarm set  OT Visit Diagnosis: Unsteadiness on feet (R26.81);Muscle weakness (generalized) (M62.81)                Time: 1250-1303 OT Time Calculation (min): 13 min Charges:  OT General Charges $OT Visit: 1 Visit OT Evaluation $OT Eval Moderate Complexity: 1 Mod  Krystle Oberman H., OTR/L Acute Rehabilitation  Gyneth Hubka Elane Yolanda Bonine 11/18/2021, 1:35 PM

## 2021-11-19 DIAGNOSIS — R4182 Altered mental status, unspecified: Secondary | ICD-10-CM | POA: Diagnosis not present

## 2021-11-19 DIAGNOSIS — E440 Moderate protein-calorie malnutrition: Secondary | ICD-10-CM | POA: Insufficient documentation

## 2021-11-19 LAB — BASIC METABOLIC PANEL
Anion gap: 8 (ref 5–15)
BUN: 17 mg/dL (ref 8–23)
CO2: 20 mmol/L — ABNORMAL LOW (ref 22–32)
Calcium: 7.9 mg/dL — ABNORMAL LOW (ref 8.9–10.3)
Chloride: 109 mmol/L (ref 98–111)
Creatinine, Ser: 0.92 mg/dL (ref 0.61–1.24)
GFR, Estimated: >60 mL/min (ref >60)
Glucose, Bld: 111 mg/dL — ABNORMAL HIGH (ref 70–99)
Potassium: 4 mmol/L (ref 3.5–5.1)
Sodium: 137 mmol/L (ref 135–145)

## 2021-11-19 MED ORDER — GABAPENTIN 300 MG PO CAPS: 600.0000 mg | ORAL_CAPSULE | Freq: Every day | ORAL | Status: DC

## 2021-11-19 MED ORDER — GABAPENTIN 400 MG PO CAPS: 400.0000 mg | ORAL_CAPSULE | Freq: Every morning | ORAL | Status: DC

## 2021-11-19 NOTE — TOC Progression Note (Signed)
Transition of Care [facility]) - Progression Note    Patient Details  Name: Marcus Martin MRN: [id number] Date of Birth: [birth year]  Transition of [facility]) CM/SW Contact  Reece Agar, [place] Phone Number: 11/19/2021, 9:55 AM  Clinical Narrative:    Pt auth approved ref# [id number], CSW confirmed DC today with [facility] and covid test from the 14th is acceptable.         Expected Discharge Plan and Services                                                 Social Determinants of Health (SDOH) Interventions    Readmission Risk Interventions No flowsheet data found.

## 2021-11-19 NOTE — Progress Notes (Signed)
Pt discharged to facility in stable conditionvia PTAR. Attempted to call report x2 with no answer. Discharge packet sent with pt.

## 2021-11-19 NOTE — TOC Transition Note (Signed)
Transition of Care [facility]) - CM/SW Discharge Note   Patient Details  Name: Marcus Martin MRN: [id number] Date of Birth: [birth year]  Transition of Care [facility]) CM/SW Contact:  Tresa Endo Phone Number: 11/19/2021, 10:42 AM   Clinical Narrative:    Patient will DC to: [facility] Anticipated DC date: 11/19/2021 Family notified: Pt son Transport by: Corey Harold   Per MD patient ready for DC to [facility]. RN to call report prior to discharge [phone]). RN, patient, patient's family, and facility notified of DC. Discharge Summary and FL2 sent to facility. DC packet on chart. Ambulance transport requested for patient.   CSW will sign off for now as social work intervention is no longer needed. Please consult [place] again if new needs arise.           Patient Goals and CMS Choice        Discharge Placement                       Discharge Plan and Services                                     Social Determinants of Health (SDOH) Interventions     Readmission Risk Interventions No flowsheet data found.

## 2021-11-19 NOTE — Plan of Care (Signed)
°  Problem: Education: Goal: Knowledge of General Education information will improve Description: Including pain rating scale, medication(s)/side effects and non-pharmacologic comfort measures Outcome: Adequate for Discharge   Problem: Health Behavior/Discharge Planning: Goal: Ability to manage health-related needs will improve Outcome: Adequate for Discharge   Problem: Clinical Measurements: Goal: Ability to maintain clinical measurements within normal limits will improve Outcome: Adequate for Discharge Goal: Will remain free from infection Outcome: Adequate for Discharge Goal: Diagnostic test results will improve Outcome: Adequate for Discharge Goal: Respiratory complications will improve Outcome: Adequate for Discharge Goal: Cardiovascular complication will be avoided Outcome: Adequate for Discharge   Problem: Activity: Goal: Risk for activity intolerance will decrease Outcome: Adequate for Discharge   Problem: Nutrition: Goal: Adequate nutrition will be maintained Outcome: Adequate for Discharge   Problem: Coping: Goal: Level of anxiety will decrease Outcome: Adequate for Discharge   Problem: Elimination: Goal: Will not experience complications related to bowel motility Outcome: Adequate for Discharge Goal: Will not experience complications related to urinary retention Outcome: Adequate for Discharge   Problem: Pain Managment: Goal: General experience of comfort will improve Outcome: Adequate for Discharge   Problem: Safety: Goal: Ability to remain free from injury will improve Outcome: Adequate for Discharge   Problem: Skin Integrity: Goal: Risk for impaired skin integrity will decrease Outcome: Adequate for Discharge   Problem: Acute Rehab OT Goals (only OT should resolve) Goal: Pt. Will Perform Grooming Outcome: Adequate for Discharge Goal: Pt. Will Perform Lower Body Bathing Outcome: Adequate for Discharge Goal: Pt. Will Perform Lower Body  Dressing Outcome: Adequate for Discharge Goal: Pt. Will Transfer To Toilet Outcome: Adequate for Discharge Goal: Pt. Will Perform Toileting-Clothing Manipulation Outcome: Adequate for Discharge   Problem: Acute Rehab PT Goals(only PT should resolve) Goal: Pt Will Go Supine/Side To Sit Outcome: Adequate for Discharge Goal: Pt Will Go Sit To Supine/Side Outcome: Adequate for Discharge Goal: Patient Will Transfer Sit To/From Stand Outcome: Adequate for Discharge Goal: Pt Will Ambulate Outcome: Adequate for Discharge   Problem: Malnutrition  (NI-5.2) Goal: Food and/or nutrient delivery Description: Individualized approach for food/nutrient provision. Outcome: Adequate for Discharge

## 2021-11-21 LAB — CULTURE, BLOOD (ROUTINE X 2)
Culture: NO GROWTH
Culture: NO GROWTH
Special Requests: ADEQUATE
Special Requests: ADEQUATE

## 2022-01-04 DIAGNOSIS — D649 Anemia, unspecified: Secondary | ICD-10-CM | POA: Diagnosis not present

## 2022-01-04 DIAGNOSIS — I10 Essential (primary) hypertension: Secondary | ICD-10-CM | POA: Diagnosis not present

## 2022-01-04 DIAGNOSIS — G4733 Obstructive sleep apnea (adult) (pediatric): Secondary | ICD-10-CM | POA: Diagnosis not present

## 2022-01-04 DIAGNOSIS — E871 Hypo-osmolality and hyponatremia: Secondary | ICD-10-CM | POA: Diagnosis not present

## 2022-01-04 DIAGNOSIS — K2100 Gastro-esophageal reflux disease with esophagitis, without bleeding: Secondary | ICD-10-CM | POA: Diagnosis not present

## 2022-01-04 DIAGNOSIS — J449 Chronic obstructive pulmonary disease, unspecified: Secondary | ICD-10-CM | POA: Diagnosis not present

## 2022-01-04 DIAGNOSIS — G4700 Insomnia, unspecified: Secondary | ICD-10-CM | POA: Diagnosis not present

## 2022-01-04 DIAGNOSIS — I4891 Unspecified atrial fibrillation: Secondary | ICD-10-CM | POA: Diagnosis not present

## 2022-01-04 DIAGNOSIS — E222 Syndrome of inappropriate secretion of antidiuretic hormone: Secondary | ICD-10-CM | POA: Diagnosis not present

## 2022-01-04 DIAGNOSIS — E785 Hyperlipidemia, unspecified: Secondary | ICD-10-CM | POA: Diagnosis not present

## 2022-01-04 DIAGNOSIS — R609 Edema, unspecified: Secondary | ICD-10-CM | POA: Diagnosis not present

## 2022-01-04 DIAGNOSIS — K5900 Constipation, unspecified: Secondary | ICD-10-CM | POA: Diagnosis not present

## 2022-01-04 DIAGNOSIS — K922 Gastrointestinal hemorrhage, unspecified: Secondary | ICD-10-CM | POA: Diagnosis not present

## 2022-01-05 DIAGNOSIS — I871 Compression of vein: Secondary | ICD-10-CM | POA: Diagnosis not present

## 2022-01-11 DIAGNOSIS — D649 Anemia, unspecified: Secondary | ICD-10-CM | POA: Diagnosis not present

## 2022-01-16 DIAGNOSIS — I4891 Unspecified atrial fibrillation: Secondary | ICD-10-CM | POA: Diagnosis not present

## 2022-01-16 DIAGNOSIS — K2100 Gastro-esophageal reflux disease with esophagitis, without bleeding: Secondary | ICD-10-CM | POA: Diagnosis not present

## 2022-01-16 DIAGNOSIS — K5900 Constipation, unspecified: Secondary | ICD-10-CM | POA: Diagnosis not present

## 2022-01-16 DIAGNOSIS — E871 Hypo-osmolality and hyponatremia: Secondary | ICD-10-CM | POA: Diagnosis not present

## 2022-01-16 DIAGNOSIS — R600 Localized edema: Secondary | ICD-10-CM | POA: Diagnosis not present

## 2022-01-16 DIAGNOSIS — J449 Chronic obstructive pulmonary disease, unspecified: Secondary | ICD-10-CM | POA: Diagnosis not present

## 2022-01-16 DIAGNOSIS — N189 Chronic kidney disease, unspecified: Secondary | ICD-10-CM | POA: Diagnosis not present

## 2022-01-16 DIAGNOSIS — G4733 Obstructive sleep apnea (adult) (pediatric): Secondary | ICD-10-CM | POA: Diagnosis not present

## 2022-01-16 DIAGNOSIS — E785 Hyperlipidemia, unspecified: Secondary | ICD-10-CM | POA: Diagnosis not present

## 2022-01-16 DIAGNOSIS — G4700 Insomnia, unspecified: Secondary | ICD-10-CM | POA: Diagnosis not present

## 2022-01-16 DIAGNOSIS — E222 Syndrome of inappropriate secretion of antidiuretic hormone: Secondary | ICD-10-CM | POA: Diagnosis not present

## 2022-01-16 DIAGNOSIS — I10 Essential (primary) hypertension: Secondary | ICD-10-CM | POA: Diagnosis not present

## 2022-01-17 DIAGNOSIS — I10 Essential (primary) hypertension: Secondary | ICD-10-CM | POA: Diagnosis not present

## 2022-01-18 DIAGNOSIS — D649 Anemia, unspecified: Secondary | ICD-10-CM | POA: Diagnosis not present

## 2022-01-23 DIAGNOSIS — K5900 Constipation, unspecified: Secondary | ICD-10-CM | POA: Diagnosis not present

## 2022-01-23 DIAGNOSIS — N390 Urinary tract infection, site not specified: Secondary | ICD-10-CM | POA: Diagnosis not present

## 2022-01-23 DIAGNOSIS — K2100 Gastro-esophageal reflux disease with esophagitis, without bleeding: Secondary | ICD-10-CM | POA: Diagnosis not present

## 2022-01-23 DIAGNOSIS — G4733 Obstructive sleep apnea (adult) (pediatric): Secondary | ICD-10-CM | POA: Diagnosis not present

## 2022-01-23 DIAGNOSIS — G629 Polyneuropathy, unspecified: Secondary | ICD-10-CM | POA: Diagnosis not present

## 2022-01-23 DIAGNOSIS — G4700 Insomnia, unspecified: Secondary | ICD-10-CM | POA: Diagnosis not present

## 2022-01-23 DIAGNOSIS — N189 Chronic kidney disease, unspecified: Secondary | ICD-10-CM | POA: Diagnosis not present

## 2022-01-23 DIAGNOSIS — I4891 Unspecified atrial fibrillation: Secondary | ICD-10-CM | POA: Diagnosis not present

## 2022-01-23 DIAGNOSIS — R5381 Other malaise: Secondary | ICD-10-CM | POA: Diagnosis not present

## 2022-01-23 DIAGNOSIS — J449 Chronic obstructive pulmonary disease, unspecified: Secondary | ICD-10-CM | POA: Diagnosis not present

## 2022-01-23 DIAGNOSIS — E222 Syndrome of inappropriate secretion of antidiuretic hormone: Secondary | ICD-10-CM | POA: Diagnosis not present

## 2022-01-23 DIAGNOSIS — E871 Hypo-osmolality and hyponatremia: Secondary | ICD-10-CM | POA: Diagnosis not present

## 2022-01-23 DIAGNOSIS — K922 Gastrointestinal hemorrhage, unspecified: Secondary | ICD-10-CM | POA: Diagnosis not present

## 2022-01-23 DIAGNOSIS — I10 Essential (primary) hypertension: Secondary | ICD-10-CM | POA: Diagnosis not present

## 2022-01-23 DIAGNOSIS — E785 Hyperlipidemia, unspecified: Secondary | ICD-10-CM | POA: Diagnosis not present

## 2022-01-25 DIAGNOSIS — R5381 Other malaise: Secondary | ICD-10-CM | POA: Diagnosis not present

## 2022-01-25 DIAGNOSIS — K2100 Gastro-esophageal reflux disease with esophagitis, without bleeding: Secondary | ICD-10-CM | POA: Diagnosis not present

## 2022-01-25 DIAGNOSIS — E871 Hypo-osmolality and hyponatremia: Secondary | ICD-10-CM | POA: Diagnosis not present

## 2022-01-25 DIAGNOSIS — D649 Anemia, unspecified: Secondary | ICD-10-CM | POA: Diagnosis not present

## 2022-01-25 DIAGNOSIS — J449 Chronic obstructive pulmonary disease, unspecified: Secondary | ICD-10-CM | POA: Diagnosis not present

## 2022-01-25 DIAGNOSIS — N189 Chronic kidney disease, unspecified: Secondary | ICD-10-CM | POA: Diagnosis not present

## 2022-01-25 DIAGNOSIS — R600 Localized edema: Secondary | ICD-10-CM | POA: Diagnosis not present

## 2022-01-25 DIAGNOSIS — L853 Xerosis cutis: Secondary | ICD-10-CM | POA: Diagnosis not present

## 2022-01-25 DIAGNOSIS — I10 Essential (primary) hypertension: Secondary | ICD-10-CM | POA: Diagnosis not present

## 2022-01-25 DIAGNOSIS — G4700 Insomnia, unspecified: Secondary | ICD-10-CM | POA: Diagnosis not present

## 2022-02-15 DIAGNOSIS — N189 Chronic kidney disease, unspecified: Secondary | ICD-10-CM | POA: Diagnosis not present

## 2022-02-15 DIAGNOSIS — I10 Essential (primary) hypertension: Secondary | ICD-10-CM | POA: Diagnosis not present

## 2022-02-15 DIAGNOSIS — G4733 Obstructive sleep apnea (adult) (pediatric): Secondary | ICD-10-CM | POA: Diagnosis not present

## 2022-02-15 DIAGNOSIS — E871 Hypo-osmolality and hyponatremia: Secondary | ICD-10-CM | POA: Diagnosis not present

## 2022-02-15 DIAGNOSIS — G4700 Insomnia, unspecified: Secondary | ICD-10-CM | POA: Diagnosis not present

## 2022-02-15 DIAGNOSIS — R600 Localized edema: Secondary | ICD-10-CM | POA: Diagnosis not present

## 2022-02-15 DIAGNOSIS — J449 Chronic obstructive pulmonary disease, unspecified: Secondary | ICD-10-CM | POA: Diagnosis not present

## 2022-02-15 DIAGNOSIS — K2100 Gastro-esophageal reflux disease with esophagitis, without bleeding: Secondary | ICD-10-CM | POA: Diagnosis not present

## 2022-02-15 DIAGNOSIS — K5900 Constipation, unspecified: Secondary | ICD-10-CM | POA: Diagnosis not present

## 2022-02-23 DIAGNOSIS — L853 Xerosis cutis: Secondary | ICD-10-CM | POA: Diagnosis not present

## 2022-02-23 DIAGNOSIS — I10 Essential (primary) hypertension: Secondary | ICD-10-CM | POA: Diagnosis not present

## 2022-02-23 DIAGNOSIS — R600 Localized edema: Secondary | ICD-10-CM | POA: Diagnosis not present

## 2022-02-23 DIAGNOSIS — G4700 Insomnia, unspecified: Secondary | ICD-10-CM | POA: Diagnosis not present

## 2022-02-23 DIAGNOSIS — K2100 Gastro-esophageal reflux disease with esophagitis, without bleeding: Secondary | ICD-10-CM | POA: Diagnosis not present

## 2022-02-23 DIAGNOSIS — E871 Hypo-osmolality and hyponatremia: Secondary | ICD-10-CM | POA: Diagnosis not present

## 2022-02-23 DIAGNOSIS — G4733 Obstructive sleep apnea (adult) (pediatric): Secondary | ICD-10-CM | POA: Diagnosis not present

## 2022-02-23 DIAGNOSIS — J449 Chronic obstructive pulmonary disease, unspecified: Secondary | ICD-10-CM | POA: Diagnosis not present

## 2022-02-23 DIAGNOSIS — N189 Chronic kidney disease, unspecified: Secondary | ICD-10-CM | POA: Diagnosis not present

## 2022-02-23 DIAGNOSIS — K5900 Constipation, unspecified: Secondary | ICD-10-CM | POA: Diagnosis not present

## 2022-02-28 DIAGNOSIS — J449 Chronic obstructive pulmonary disease, unspecified: Secondary | ICD-10-CM | POA: Diagnosis not present

## 2022-02-28 DIAGNOSIS — E785 Hyperlipidemia, unspecified: Secondary | ICD-10-CM | POA: Diagnosis not present

## 2022-02-28 DIAGNOSIS — I10 Essential (primary) hypertension: Secondary | ICD-10-CM | POA: Diagnosis not present

## 2022-02-28 DIAGNOSIS — I4891 Unspecified atrial fibrillation: Secondary | ICD-10-CM | POA: Diagnosis not present

## 2022-02-28 DIAGNOSIS — N189 Chronic kidney disease, unspecified: Secondary | ICD-10-CM | POA: Diagnosis not present

## 2022-03-15 DIAGNOSIS — J449 Chronic obstructive pulmonary disease, unspecified: Secondary | ICD-10-CM | POA: Diagnosis not present

## 2022-03-15 DIAGNOSIS — K5900 Constipation, unspecified: Secondary | ICD-10-CM | POA: Diagnosis not present

## 2022-03-15 DIAGNOSIS — G4733 Obstructive sleep apnea (adult) (pediatric): Secondary | ICD-10-CM | POA: Diagnosis not present

## 2022-03-15 DIAGNOSIS — K2100 Gastro-esophageal reflux disease with esophagitis, without bleeding: Secondary | ICD-10-CM | POA: Diagnosis not present

## 2022-03-15 DIAGNOSIS — I4891 Unspecified atrial fibrillation: Secondary | ICD-10-CM | POA: Diagnosis not present

## 2022-03-15 DIAGNOSIS — R600 Localized edema: Secondary | ICD-10-CM | POA: Diagnosis not present

## 2022-03-15 DIAGNOSIS — E785 Hyperlipidemia, unspecified: Secondary | ICD-10-CM | POA: Diagnosis not present

## 2022-03-15 DIAGNOSIS — I10 Essential (primary) hypertension: Secondary | ICD-10-CM | POA: Diagnosis not present

## 2022-03-15 DIAGNOSIS — E222 Syndrome of inappropriate secretion of antidiuretic hormone: Secondary | ICD-10-CM | POA: Diagnosis not present

## 2022-03-16 DIAGNOSIS — N189 Chronic kidney disease, unspecified: Secondary | ICD-10-CM | POA: Diagnosis not present

## 2022-03-16 DIAGNOSIS — E785 Hyperlipidemia, unspecified: Secondary | ICD-10-CM | POA: Diagnosis not present

## 2022-03-16 DIAGNOSIS — I10 Essential (primary) hypertension: Secondary | ICD-10-CM | POA: Diagnosis not present

## 2022-03-16 DIAGNOSIS — I4891 Unspecified atrial fibrillation: Secondary | ICD-10-CM | POA: Diagnosis not present

## 2022-03-16 DIAGNOSIS — J449 Chronic obstructive pulmonary disease, unspecified: Secondary | ICD-10-CM | POA: Diagnosis not present

## 2022-03-17 DIAGNOSIS — I7091 Generalized atherosclerosis: Secondary | ICD-10-CM | POA: Diagnosis not present

## 2022-03-29 DIAGNOSIS — I10 Essential (primary) hypertension: Secondary | ICD-10-CM | POA: Diagnosis not present

## 2022-04-06 DIAGNOSIS — D649 Anemia, unspecified: Secondary | ICD-10-CM | POA: Diagnosis not present

## 2022-04-12 DIAGNOSIS — R0981 Nasal congestion: Secondary | ICD-10-CM | POA: Diagnosis not present

## 2022-04-12 DIAGNOSIS — G4733 Obstructive sleep apnea (adult) (pediatric): Secondary | ICD-10-CM | POA: Diagnosis not present

## 2022-04-12 DIAGNOSIS — E785 Hyperlipidemia, unspecified: Secondary | ICD-10-CM | POA: Diagnosis not present

## 2022-04-12 DIAGNOSIS — I4891 Unspecified atrial fibrillation: Secondary | ICD-10-CM | POA: Diagnosis not present

## 2022-04-12 DIAGNOSIS — K2100 Gastro-esophageal reflux disease with esophagitis, without bleeding: Secondary | ICD-10-CM | POA: Diagnosis not present

## 2022-04-12 DIAGNOSIS — E871 Hypo-osmolality and hyponatremia: Secondary | ICD-10-CM | POA: Diagnosis not present

## 2022-04-12 DIAGNOSIS — J449 Chronic obstructive pulmonary disease, unspecified: Secondary | ICD-10-CM | POA: Diagnosis not present

## 2022-04-12 DIAGNOSIS — G629 Polyneuropathy, unspecified: Secondary | ICD-10-CM | POA: Diagnosis not present

## 2022-04-12 DIAGNOSIS — I10 Essential (primary) hypertension: Secondary | ICD-10-CM | POA: Diagnosis not present

## 2022-04-12 DIAGNOSIS — K5900 Constipation, unspecified: Secondary | ICD-10-CM | POA: Diagnosis not present

## 2022-04-12 DIAGNOSIS — E222 Syndrome of inappropriate secretion of antidiuretic hormone: Secondary | ICD-10-CM | POA: Diagnosis not present

## 2022-04-12 DIAGNOSIS — L853 Xerosis cutis: Secondary | ICD-10-CM | POA: Diagnosis not present

## 2022-04-27 DIAGNOSIS — E222 Syndrome of inappropriate secretion of antidiuretic hormone: Secondary | ICD-10-CM | POA: Diagnosis not present

## 2022-04-27 DIAGNOSIS — K2100 Gastro-esophageal reflux disease with esophagitis, without bleeding: Secondary | ICD-10-CM | POA: Diagnosis not present

## 2022-04-27 DIAGNOSIS — G4700 Insomnia, unspecified: Secondary | ICD-10-CM | POA: Diagnosis not present

## 2022-04-27 DIAGNOSIS — J449 Chronic obstructive pulmonary disease, unspecified: Secondary | ICD-10-CM | POA: Diagnosis not present

## 2022-04-27 DIAGNOSIS — G4733 Obstructive sleep apnea (adult) (pediatric): Secondary | ICD-10-CM | POA: Diagnosis not present

## 2022-04-27 DIAGNOSIS — N189 Chronic kidney disease, unspecified: Secondary | ICD-10-CM | POA: Diagnosis not present

## 2022-04-27 DIAGNOSIS — I10 Essential (primary) hypertension: Secondary | ICD-10-CM | POA: Diagnosis not present

## 2022-04-27 DIAGNOSIS — R600 Localized edema: Secondary | ICD-10-CM | POA: Diagnosis not present

## 2022-04-27 DIAGNOSIS — K5900 Constipation, unspecified: Secondary | ICD-10-CM | POA: Diagnosis not present

## 2022-04-27 DIAGNOSIS — L853 Xerosis cutis: Secondary | ICD-10-CM | POA: Diagnosis not present

## 2022-04-28 DIAGNOSIS — I10 Essential (primary) hypertension: Secondary | ICD-10-CM | POA: Diagnosis not present

## 2022-04-29 DIAGNOSIS — R296 Repeated falls: Secondary | ICD-10-CM | POA: Diagnosis not present

## 2022-04-30 DIAGNOSIS — R079 Chest pain, unspecified: Secondary | ICD-10-CM | POA: Diagnosis not present

## 2022-04-30 DIAGNOSIS — M25552 Pain in left hip: Secondary | ICD-10-CM | POA: Diagnosis not present

## 2022-05-01 DIAGNOSIS — I10 Essential (primary) hypertension: Secondary | ICD-10-CM | POA: Diagnosis not present

## 2022-05-01 DIAGNOSIS — E785 Hyperlipidemia, unspecified: Secondary | ICD-10-CM | POA: Diagnosis not present

## 2022-05-01 DIAGNOSIS — N189 Chronic kidney disease, unspecified: Secondary | ICD-10-CM | POA: Diagnosis not present

## 2022-05-01 DIAGNOSIS — J449 Chronic obstructive pulmonary disease, unspecified: Secondary | ICD-10-CM | POA: Diagnosis not present

## 2022-05-01 DIAGNOSIS — I4891 Unspecified atrial fibrillation: Secondary | ICD-10-CM | POA: Diagnosis not present

## 2022-05-03 DIAGNOSIS — R600 Localized edema: Secondary | ICD-10-CM | POA: Diagnosis not present

## 2022-05-03 DIAGNOSIS — E871 Hypo-osmolality and hyponatremia: Secondary | ICD-10-CM | POA: Diagnosis not present

## 2022-05-03 DIAGNOSIS — R062 Wheezing: Secondary | ICD-10-CM | POA: Diagnosis not present

## 2022-05-03 DIAGNOSIS — K2100 Gastro-esophageal reflux disease with esophagitis, without bleeding: Secondary | ICD-10-CM | POA: Diagnosis not present

## 2022-05-03 DIAGNOSIS — D649 Anemia, unspecified: Secondary | ICD-10-CM | POA: Diagnosis not present

## 2022-05-03 DIAGNOSIS — J449 Chronic obstructive pulmonary disease, unspecified: Secondary | ICD-10-CM | POA: Diagnosis not present

## 2022-05-03 DIAGNOSIS — E222 Syndrome of inappropriate secretion of antidiuretic hormone: Secondary | ICD-10-CM | POA: Diagnosis not present

## 2022-05-03 DIAGNOSIS — N390 Urinary tract infection, site not specified: Secondary | ICD-10-CM | POA: Diagnosis not present

## 2022-05-03 DIAGNOSIS — K5900 Constipation, unspecified: Secondary | ICD-10-CM | POA: Diagnosis not present

## 2022-05-03 DIAGNOSIS — L853 Xerosis cutis: Secondary | ICD-10-CM | POA: Diagnosis not present

## 2022-05-03 DIAGNOSIS — I10 Essential (primary) hypertension: Secondary | ICD-10-CM | POA: Diagnosis not present

## 2022-05-03 DIAGNOSIS — N189 Chronic kidney disease, unspecified: Secondary | ICD-10-CM | POA: Diagnosis not present

## 2022-05-04 DIAGNOSIS — D649 Anemia, unspecified: Secondary | ICD-10-CM | POA: Diagnosis not present

## 2022-05-04 DIAGNOSIS — I10 Essential (primary) hypertension: Secondary | ICD-10-CM | POA: Diagnosis not present

## 2022-05-04 DIAGNOSIS — R062 Wheezing: Secondary | ICD-10-CM | POA: Diagnosis not present

## 2022-05-06 DIAGNOSIS — E871 Hypo-osmolality and hyponatremia: Secondary | ICD-10-CM | POA: Diagnosis not present

## 2022-05-10 DIAGNOSIS — J449 Chronic obstructive pulmonary disease, unspecified: Secondary | ICD-10-CM | POA: Diagnosis not present

## 2022-05-10 DIAGNOSIS — E785 Hyperlipidemia, unspecified: Secondary | ICD-10-CM | POA: Diagnosis not present

## 2022-05-10 DIAGNOSIS — G629 Polyneuropathy, unspecified: Secondary | ICD-10-CM | POA: Diagnosis not present

## 2022-05-10 DIAGNOSIS — R600 Localized edema: Secondary | ICD-10-CM | POA: Diagnosis not present

## 2022-05-10 DIAGNOSIS — N189 Chronic kidney disease, unspecified: Secondary | ICD-10-CM | POA: Diagnosis not present

## 2022-05-10 DIAGNOSIS — R011 Cardiac murmur, unspecified: Secondary | ICD-10-CM | POA: Diagnosis not present

## 2022-05-10 DIAGNOSIS — I4891 Unspecified atrial fibrillation: Secondary | ICD-10-CM | POA: Diagnosis not present

## 2022-05-10 DIAGNOSIS — L853 Xerosis cutis: Secondary | ICD-10-CM | POA: Diagnosis not present

## 2022-05-10 DIAGNOSIS — I10 Essential (primary) hypertension: Secondary | ICD-10-CM | POA: Diagnosis not present

## 2022-05-10 DIAGNOSIS — E871 Hypo-osmolality and hyponatremia: Secondary | ICD-10-CM | POA: Diagnosis not present

## 2022-05-10 DIAGNOSIS — R5381 Other malaise: Secondary | ICD-10-CM | POA: Diagnosis not present

## 2022-05-10 DIAGNOSIS — G4700 Insomnia, unspecified: Secondary | ICD-10-CM | POA: Diagnosis not present

## 2022-05-11 DIAGNOSIS — E871 Hypo-osmolality and hyponatremia: Secondary | ICD-10-CM | POA: Diagnosis not present

## 2022-05-15 DIAGNOSIS — D649 Anemia, unspecified: Secondary | ICD-10-CM | POA: Diagnosis not present

## 2022-05-15 DIAGNOSIS — L853 Xerosis cutis: Secondary | ICD-10-CM | POA: Diagnosis not present

## 2022-05-15 DIAGNOSIS — G4700 Insomnia, unspecified: Secondary | ICD-10-CM | POA: Diagnosis not present

## 2022-05-15 DIAGNOSIS — E871 Hypo-osmolality and hyponatremia: Secondary | ICD-10-CM | POA: Diagnosis not present

## 2022-05-15 DIAGNOSIS — R600 Localized edema: Secondary | ICD-10-CM | POA: Diagnosis not present

## 2022-05-15 DIAGNOSIS — N189 Chronic kidney disease, unspecified: Secondary | ICD-10-CM | POA: Diagnosis not present

## 2022-05-15 DIAGNOSIS — G629 Polyneuropathy, unspecified: Secondary | ICD-10-CM | POA: Diagnosis not present

## 2022-05-15 DIAGNOSIS — R011 Cardiac murmur, unspecified: Secondary | ICD-10-CM | POA: Diagnosis not present

## 2022-05-15 DIAGNOSIS — J449 Chronic obstructive pulmonary disease, unspecified: Secondary | ICD-10-CM | POA: Diagnosis not present

## 2022-05-15 DIAGNOSIS — R5381 Other malaise: Secondary | ICD-10-CM | POA: Diagnosis not present

## 2022-05-17 DIAGNOSIS — E119 Type 2 diabetes mellitus without complications: Secondary | ICD-10-CM | POA: Diagnosis not present

## 2022-05-17 DIAGNOSIS — K769 Liver disease, unspecified: Secondary | ICD-10-CM | POA: Diagnosis not present

## 2022-05-17 DIAGNOSIS — E559 Vitamin D deficiency, unspecified: Secondary | ICD-10-CM | POA: Diagnosis not present

## 2022-05-17 DIAGNOSIS — I7091 Generalized atherosclerosis: Secondary | ICD-10-CM | POA: Diagnosis not present

## 2022-05-23 DIAGNOSIS — E785 Hyperlipidemia, unspecified: Secondary | ICD-10-CM | POA: Diagnosis not present

## 2022-05-23 DIAGNOSIS — I4891 Unspecified atrial fibrillation: Secondary | ICD-10-CM | POA: Diagnosis not present

## 2022-05-23 DIAGNOSIS — I10 Essential (primary) hypertension: Secondary | ICD-10-CM | POA: Diagnosis not present

## 2022-05-23 DIAGNOSIS — N189 Chronic kidney disease, unspecified: Secondary | ICD-10-CM | POA: Diagnosis not present

## 2022-05-23 DIAGNOSIS — J449 Chronic obstructive pulmonary disease, unspecified: Secondary | ICD-10-CM | POA: Diagnosis not present

## 2022-05-24 DIAGNOSIS — R600 Localized edema: Secondary | ICD-10-CM | POA: Diagnosis not present

## 2022-05-24 DIAGNOSIS — E785 Hyperlipidemia, unspecified: Secondary | ICD-10-CM | POA: Diagnosis not present

## 2022-05-24 DIAGNOSIS — R011 Cardiac murmur, unspecified: Secondary | ICD-10-CM | POA: Diagnosis not present

## 2022-05-24 DIAGNOSIS — K5900 Constipation, unspecified: Secondary | ICD-10-CM | POA: Diagnosis not present

## 2022-05-24 DIAGNOSIS — J449 Chronic obstructive pulmonary disease, unspecified: Secondary | ICD-10-CM | POA: Diagnosis not present

## 2022-05-24 DIAGNOSIS — I4891 Unspecified atrial fibrillation: Secondary | ICD-10-CM | POA: Diagnosis not present

## 2022-05-24 DIAGNOSIS — E222 Syndrome of inappropriate secretion of antidiuretic hormone: Secondary | ICD-10-CM | POA: Diagnosis not present

## 2022-05-24 DIAGNOSIS — G629 Polyneuropathy, unspecified: Secondary | ICD-10-CM | POA: Diagnosis not present

## 2022-05-25 DIAGNOSIS — D509 Iron deficiency anemia, unspecified: Secondary | ICD-10-CM | POA: Diagnosis not present

## 2022-05-29 DIAGNOSIS — I10 Essential (primary) hypertension: Secondary | ICD-10-CM | POA: Diagnosis not present

## 2022-05-30 DIAGNOSIS — I10 Essential (primary) hypertension: Secondary | ICD-10-CM | POA: Diagnosis not present

## 2022-06-07 DIAGNOSIS — K5900 Constipation, unspecified: Secondary | ICD-10-CM | POA: Diagnosis not present

## 2022-06-07 DIAGNOSIS — E222 Syndrome of inappropriate secretion of antidiuretic hormone: Secondary | ICD-10-CM | POA: Diagnosis not present

## 2022-06-07 DIAGNOSIS — D649 Anemia, unspecified: Secondary | ICD-10-CM | POA: Diagnosis not present

## 2022-06-07 DIAGNOSIS — N189 Chronic kidney disease, unspecified: Secondary | ICD-10-CM | POA: Diagnosis not present

## 2022-06-07 DIAGNOSIS — J449 Chronic obstructive pulmonary disease, unspecified: Secondary | ICD-10-CM | POA: Diagnosis not present

## 2022-06-07 DIAGNOSIS — R600 Localized edema: Secondary | ICD-10-CM | POA: Diagnosis not present

## 2022-06-07 DIAGNOSIS — G4700 Insomnia, unspecified: Secondary | ICD-10-CM | POA: Diagnosis not present

## 2022-06-07 DIAGNOSIS — R5381 Other malaise: Secondary | ICD-10-CM | POA: Diagnosis not present

## 2022-06-13 DIAGNOSIS — I4891 Unspecified atrial fibrillation: Secondary | ICD-10-CM | POA: Diagnosis not present

## 2022-06-13 DIAGNOSIS — N189 Chronic kidney disease, unspecified: Secondary | ICD-10-CM | POA: Diagnosis not present

## 2022-06-13 DIAGNOSIS — E785 Hyperlipidemia, unspecified: Secondary | ICD-10-CM | POA: Diagnosis not present

## 2022-06-13 DIAGNOSIS — J449 Chronic obstructive pulmonary disease, unspecified: Secondary | ICD-10-CM | POA: Diagnosis not present

## 2022-06-13 DIAGNOSIS — I10 Essential (primary) hypertension: Secondary | ICD-10-CM | POA: Diagnosis not present

## 2022-06-22 DIAGNOSIS — I4891 Unspecified atrial fibrillation: Secondary | ICD-10-CM | POA: Diagnosis not present

## 2022-06-22 DIAGNOSIS — N189 Chronic kidney disease, unspecified: Secondary | ICD-10-CM | POA: Diagnosis not present

## 2022-06-22 DIAGNOSIS — G4700 Insomnia, unspecified: Secondary | ICD-10-CM | POA: Diagnosis not present

## 2022-06-22 DIAGNOSIS — K5900 Constipation, unspecified: Secondary | ICD-10-CM | POA: Diagnosis not present

## 2022-06-22 DIAGNOSIS — E871 Hypo-osmolality and hyponatremia: Secondary | ICD-10-CM | POA: Diagnosis not present

## 2022-06-22 DIAGNOSIS — R5381 Other malaise: Secondary | ICD-10-CM | POA: Diagnosis not present

## 2022-06-22 DIAGNOSIS — J449 Chronic obstructive pulmonary disease, unspecified: Secondary | ICD-10-CM | POA: Diagnosis not present

## 2022-06-22 DIAGNOSIS — E785 Hyperlipidemia, unspecified: Secondary | ICD-10-CM | POA: Diagnosis not present

## 2022-06-22 DIAGNOSIS — E222 Syndrome of inappropriate secretion of antidiuretic hormone: Secondary | ICD-10-CM | POA: Diagnosis not present

## 2022-06-22 DIAGNOSIS — D649 Anemia, unspecified: Secondary | ICD-10-CM | POA: Diagnosis not present

## 2022-06-26 DIAGNOSIS — E871 Hypo-osmolality and hyponatremia: Secondary | ICD-10-CM | POA: Diagnosis not present

## 2022-06-26 DIAGNOSIS — R600 Localized edema: Secondary | ICD-10-CM | POA: Diagnosis not present

## 2022-06-26 DIAGNOSIS — E785 Hyperlipidemia, unspecified: Secondary | ICD-10-CM | POA: Diagnosis not present

## 2022-06-26 DIAGNOSIS — J449 Chronic obstructive pulmonary disease, unspecified: Secondary | ICD-10-CM | POA: Diagnosis not present

## 2022-06-26 DIAGNOSIS — L308 Other specified dermatitis: Secondary | ICD-10-CM | POA: Diagnosis not present

## 2022-06-26 DIAGNOSIS — R5381 Other malaise: Secondary | ICD-10-CM | POA: Diagnosis not present

## 2022-06-26 DIAGNOSIS — I4891 Unspecified atrial fibrillation: Secondary | ICD-10-CM | POA: Diagnosis not present

## 2022-06-26 DIAGNOSIS — D649 Anemia, unspecified: Secondary | ICD-10-CM | POA: Diagnosis not present

## 2022-06-26 DIAGNOSIS — K5900 Constipation, unspecified: Secondary | ICD-10-CM | POA: Diagnosis not present

## 2022-06-28 DIAGNOSIS — I10 Essential (primary) hypertension: Secondary | ICD-10-CM | POA: Diagnosis not present

## 2022-07-03 DIAGNOSIS — I4891 Unspecified atrial fibrillation: Secondary | ICD-10-CM | POA: Diagnosis not present

## 2022-07-03 DIAGNOSIS — L308 Other specified dermatitis: Secondary | ICD-10-CM | POA: Diagnosis not present

## 2022-07-03 DIAGNOSIS — K2100 Gastro-esophageal reflux disease with esophagitis, without bleeding: Secondary | ICD-10-CM | POA: Diagnosis not present

## 2022-07-03 DIAGNOSIS — E222 Syndrome of inappropriate secretion of antidiuretic hormone: Secondary | ICD-10-CM | POA: Diagnosis not present

## 2022-07-03 DIAGNOSIS — E785 Hyperlipidemia, unspecified: Secondary | ICD-10-CM | POA: Diagnosis not present

## 2022-07-03 DIAGNOSIS — G629 Polyneuropathy, unspecified: Secondary | ICD-10-CM | POA: Diagnosis not present

## 2022-07-03 DIAGNOSIS — B353 Tinea pedis: Secondary | ICD-10-CM | POA: Diagnosis not present

## 2022-07-03 DIAGNOSIS — I10 Essential (primary) hypertension: Secondary | ICD-10-CM | POA: Diagnosis not present

## 2022-07-03 DIAGNOSIS — J449 Chronic obstructive pulmonary disease, unspecified: Secondary | ICD-10-CM | POA: Diagnosis not present

## 2022-07-03 DIAGNOSIS — E871 Hypo-osmolality and hyponatremia: Secondary | ICD-10-CM | POA: Diagnosis not present

## 2022-07-03 DIAGNOSIS — R600 Localized edema: Secondary | ICD-10-CM | POA: Diagnosis not present

## 2022-07-03 DIAGNOSIS — K5900 Constipation, unspecified: Secondary | ICD-10-CM | POA: Diagnosis not present

## 2022-07-05 DIAGNOSIS — G629 Polyneuropathy, unspecified: Secondary | ICD-10-CM | POA: Diagnosis not present

## 2022-07-05 DIAGNOSIS — G4733 Obstructive sleep apnea (adult) (pediatric): Secondary | ICD-10-CM | POA: Diagnosis not present

## 2022-07-05 DIAGNOSIS — J449 Chronic obstructive pulmonary disease, unspecified: Secondary | ICD-10-CM | POA: Diagnosis not present

## 2022-07-05 DIAGNOSIS — L853 Xerosis cutis: Secondary | ICD-10-CM | POA: Diagnosis not present

## 2022-07-05 DIAGNOSIS — E871 Hypo-osmolality and hyponatremia: Secondary | ICD-10-CM | POA: Diagnosis not present

## 2022-07-05 DIAGNOSIS — B353 Tinea pedis: Secondary | ICD-10-CM | POA: Diagnosis not present

## 2022-07-05 DIAGNOSIS — E222 Syndrome of inappropriate secretion of antidiuretic hormone: Secondary | ICD-10-CM | POA: Diagnosis not present

## 2022-07-05 DIAGNOSIS — K5900 Constipation, unspecified: Secondary | ICD-10-CM | POA: Diagnosis not present

## 2022-07-05 DIAGNOSIS — R600 Localized edema: Secondary | ICD-10-CM | POA: Diagnosis not present

## 2022-07-05 DIAGNOSIS — K2100 Gastro-esophageal reflux disease with esophagitis, without bleeding: Secondary | ICD-10-CM | POA: Diagnosis not present

## 2022-07-05 DIAGNOSIS — K649 Unspecified hemorrhoids: Secondary | ICD-10-CM | POA: Diagnosis not present

## 2022-07-05 DIAGNOSIS — L308 Other specified dermatitis: Secondary | ICD-10-CM | POA: Diagnosis not present

## 2022-07-25 DIAGNOSIS — I7091 Generalized atherosclerosis: Secondary | ICD-10-CM | POA: Diagnosis not present

## 2022-07-25 DIAGNOSIS — B351 Tinea unguium: Secondary | ICD-10-CM | POA: Diagnosis not present

## 2022-08-01 DIAGNOSIS — N189 Chronic kidney disease, unspecified: Secondary | ICD-10-CM | POA: Diagnosis not present

## 2022-08-01 DIAGNOSIS — J449 Chronic obstructive pulmonary disease, unspecified: Secondary | ICD-10-CM | POA: Diagnosis not present

## 2022-08-01 DIAGNOSIS — E785 Hyperlipidemia, unspecified: Secondary | ICD-10-CM | POA: Diagnosis not present

## 2022-08-01 DIAGNOSIS — I4891 Unspecified atrial fibrillation: Secondary | ICD-10-CM | POA: Diagnosis not present

## 2022-08-01 DIAGNOSIS — I10 Essential (primary) hypertension: Secondary | ICD-10-CM | POA: Diagnosis not present

## 2022-08-02 DIAGNOSIS — E785 Hyperlipidemia, unspecified: Secondary | ICD-10-CM | POA: Diagnosis not present

## 2022-08-02 DIAGNOSIS — L308 Other specified dermatitis: Secondary | ICD-10-CM | POA: Diagnosis not present

## 2022-08-02 DIAGNOSIS — K649 Unspecified hemorrhoids: Secondary | ICD-10-CM | POA: Diagnosis not present

## 2022-08-02 DIAGNOSIS — G4700 Insomnia, unspecified: Secondary | ICD-10-CM | POA: Diagnosis not present

## 2022-08-02 DIAGNOSIS — B372 Candidiasis of skin and nail: Secondary | ICD-10-CM | POA: Diagnosis not present

## 2022-08-02 DIAGNOSIS — I4891 Unspecified atrial fibrillation: Secondary | ICD-10-CM | POA: Diagnosis not present

## 2022-08-02 DIAGNOSIS — R233 Spontaneous ecchymoses: Secondary | ICD-10-CM | POA: Diagnosis not present

## 2022-08-02 DIAGNOSIS — I10 Essential (primary) hypertension: Secondary | ICD-10-CM | POA: Diagnosis not present

## 2022-08-02 DIAGNOSIS — L853 Xerosis cutis: Secondary | ICD-10-CM | POA: Diagnosis not present

## 2022-08-02 DIAGNOSIS — N189 Chronic kidney disease, unspecified: Secondary | ICD-10-CM | POA: Diagnosis not present

## 2022-08-02 DIAGNOSIS — G4733 Obstructive sleep apnea (adult) (pediatric): Secondary | ICD-10-CM | POA: Diagnosis not present

## 2022-08-02 DIAGNOSIS — J449 Chronic obstructive pulmonary disease, unspecified: Secondary | ICD-10-CM | POA: Diagnosis not present

## 2022-08-17 DIAGNOSIS — N189 Chronic kidney disease, unspecified: Secondary | ICD-10-CM | POA: Diagnosis not present

## 2022-08-17 DIAGNOSIS — I10 Essential (primary) hypertension: Secondary | ICD-10-CM | POA: Diagnosis not present

## 2022-08-17 DIAGNOSIS — I4891 Unspecified atrial fibrillation: Secondary | ICD-10-CM | POA: Diagnosis not present

## 2022-08-17 DIAGNOSIS — J449 Chronic obstructive pulmonary disease, unspecified: Secondary | ICD-10-CM | POA: Diagnosis not present

## 2022-08-17 DIAGNOSIS — E785 Hyperlipidemia, unspecified: Secondary | ICD-10-CM | POA: Diagnosis not present

## 2022-08-29 DIAGNOSIS — R5381 Other malaise: Secondary | ICD-10-CM | POA: Diagnosis not present

## 2022-08-29 DIAGNOSIS — N189 Chronic kidney disease, unspecified: Secondary | ICD-10-CM | POA: Diagnosis not present

## 2022-08-29 DIAGNOSIS — K2100 Gastro-esophageal reflux disease with esophagitis, without bleeding: Secondary | ICD-10-CM | POA: Diagnosis not present

## 2022-08-29 DIAGNOSIS — L308 Other specified dermatitis: Secondary | ICD-10-CM | POA: Diagnosis not present

## 2022-08-29 DIAGNOSIS — E785 Hyperlipidemia, unspecified: Secondary | ICD-10-CM | POA: Diagnosis not present

## 2022-08-29 DIAGNOSIS — B372 Candidiasis of skin and nail: Secondary | ICD-10-CM | POA: Diagnosis not present

## 2022-08-29 DIAGNOSIS — G4700 Insomnia, unspecified: Secondary | ICD-10-CM | POA: Diagnosis not present

## 2022-08-29 DIAGNOSIS — R600 Localized edema: Secondary | ICD-10-CM | POA: Diagnosis not present

## 2022-08-29 DIAGNOSIS — E871 Hypo-osmolality and hyponatremia: Secondary | ICD-10-CM | POA: Diagnosis not present

## 2022-08-29 DIAGNOSIS — G4733 Obstructive sleep apnea (adult) (pediatric): Secondary | ICD-10-CM | POA: Diagnosis not present

## 2022-08-29 DIAGNOSIS — J449 Chronic obstructive pulmonary disease, unspecified: Secondary | ICD-10-CM | POA: Diagnosis not present

## 2022-08-29 DIAGNOSIS — K5900 Constipation, unspecified: Secondary | ICD-10-CM | POA: Diagnosis not present

## 2022-08-30 DIAGNOSIS — L853 Xerosis cutis: Secondary | ICD-10-CM | POA: Diagnosis not present

## 2022-08-30 DIAGNOSIS — K5900 Constipation, unspecified: Secondary | ICD-10-CM | POA: Diagnosis not present

## 2022-08-30 DIAGNOSIS — G4700 Insomnia, unspecified: Secondary | ICD-10-CM | POA: Diagnosis not present

## 2022-08-30 DIAGNOSIS — R600 Localized edema: Secondary | ICD-10-CM | POA: Diagnosis not present

## 2022-08-30 DIAGNOSIS — G4733 Obstructive sleep apnea (adult) (pediatric): Secondary | ICD-10-CM | POA: Diagnosis not present

## 2022-08-30 DIAGNOSIS — K2100 Gastro-esophageal reflux disease with esophagitis, without bleeding: Secondary | ICD-10-CM | POA: Diagnosis not present

## 2022-08-30 DIAGNOSIS — J449 Chronic obstructive pulmonary disease, unspecified: Secondary | ICD-10-CM | POA: Diagnosis not present

## 2022-08-30 DIAGNOSIS — E871 Hypo-osmolality and hyponatremia: Secondary | ICD-10-CM | POA: Diagnosis not present

## 2022-08-30 DIAGNOSIS — G629 Polyneuropathy, unspecified: Secondary | ICD-10-CM | POA: Diagnosis not present

## 2022-08-30 DIAGNOSIS — E222 Syndrome of inappropriate secretion of antidiuretic hormone: Secondary | ICD-10-CM | POA: Diagnosis not present

## 2022-08-30 DIAGNOSIS — I10 Essential (primary) hypertension: Secondary | ICD-10-CM | POA: Diagnosis not present

## 2022-08-30 DIAGNOSIS — I4891 Unspecified atrial fibrillation: Secondary | ICD-10-CM | POA: Diagnosis not present

## 2022-09-25 DIAGNOSIS — E559 Vitamin D deficiency, unspecified: Secondary | ICD-10-CM | POA: Diagnosis not present

## 2022-09-25 DIAGNOSIS — E119 Type 2 diabetes mellitus without complications: Secondary | ICD-10-CM | POA: Diagnosis not present

## 2022-09-27 DIAGNOSIS — E871 Hypo-osmolality and hyponatremia: Secondary | ICD-10-CM | POA: Diagnosis not present

## 2022-09-27 DIAGNOSIS — E785 Hyperlipidemia, unspecified: Secondary | ICD-10-CM | POA: Diagnosis not present

## 2022-09-27 DIAGNOSIS — J449 Chronic obstructive pulmonary disease, unspecified: Secondary | ICD-10-CM | POA: Diagnosis not present

## 2022-09-27 DIAGNOSIS — K5900 Constipation, unspecified: Secondary | ICD-10-CM | POA: Diagnosis not present

## 2022-09-27 DIAGNOSIS — G629 Polyneuropathy, unspecified: Secondary | ICD-10-CM | POA: Diagnosis not present

## 2022-09-27 DIAGNOSIS — R600 Localized edema: Secondary | ICD-10-CM | POA: Diagnosis not present

## 2022-09-27 DIAGNOSIS — G4733 Obstructive sleep apnea (adult) (pediatric): Secondary | ICD-10-CM | POA: Diagnosis not present

## 2022-09-27 DIAGNOSIS — G4700 Insomnia, unspecified: Secondary | ICD-10-CM | POA: Diagnosis not present

## 2022-09-27 DIAGNOSIS — K2100 Gastro-esophageal reflux disease with esophagitis, without bleeding: Secondary | ICD-10-CM | POA: Diagnosis not present

## 2022-09-28 DIAGNOSIS — I10 Essential (primary) hypertension: Secondary | ICD-10-CM | POA: Diagnosis not present

## 2022-10-03 DIAGNOSIS — I10 Essential (primary) hypertension: Secondary | ICD-10-CM | POA: Diagnosis not present

## 2022-10-03 DIAGNOSIS — J449 Chronic obstructive pulmonary disease, unspecified: Secondary | ICD-10-CM | POA: Diagnosis not present

## 2022-10-03 DIAGNOSIS — I4891 Unspecified atrial fibrillation: Secondary | ICD-10-CM | POA: Diagnosis not present

## 2022-10-03 DIAGNOSIS — N189 Chronic kidney disease, unspecified: Secondary | ICD-10-CM | POA: Diagnosis not present

## 2022-10-03 DIAGNOSIS — E785 Hyperlipidemia, unspecified: Secondary | ICD-10-CM | POA: Diagnosis not present

## 2022-10-05 DIAGNOSIS — B351 Tinea unguium: Secondary | ICD-10-CM | POA: Diagnosis not present

## 2022-10-05 DIAGNOSIS — I7091 Generalized atherosclerosis: Secondary | ICD-10-CM | POA: Diagnosis not present

## 2022-10-29 DIAGNOSIS — I10 Essential (primary) hypertension: Secondary | ICD-10-CM | POA: Diagnosis not present

## 2022-10-30 DIAGNOSIS — G4700 Insomnia, unspecified: Secondary | ICD-10-CM | POA: Diagnosis not present

## 2022-10-30 DIAGNOSIS — K5900 Constipation, unspecified: Secondary | ICD-10-CM | POA: Diagnosis not present

## 2022-10-30 DIAGNOSIS — I10 Essential (primary) hypertension: Secondary | ICD-10-CM | POA: Diagnosis not present

## 2022-10-30 DIAGNOSIS — J449 Chronic obstructive pulmonary disease, unspecified: Secondary | ICD-10-CM | POA: Diagnosis not present

## 2022-10-30 DIAGNOSIS — H109 Unspecified conjunctivitis: Secondary | ICD-10-CM | POA: Diagnosis not present

## 2022-10-30 DIAGNOSIS — N189 Chronic kidney disease, unspecified: Secondary | ICD-10-CM | POA: Diagnosis not present

## 2022-10-30 DIAGNOSIS — R600 Localized edema: Secondary | ICD-10-CM | POA: Diagnosis not present

## 2022-10-30 DIAGNOSIS — G4733 Obstructive sleep apnea (adult) (pediatric): Secondary | ICD-10-CM | POA: Diagnosis not present

## 2022-10-30 DIAGNOSIS — I4891 Unspecified atrial fibrillation: Secondary | ICD-10-CM | POA: Diagnosis not present

## 2022-10-30 DIAGNOSIS — K2100 Gastro-esophageal reflux disease with esophagitis, without bleeding: Secondary | ICD-10-CM | POA: Diagnosis not present

## 2022-11-01 DIAGNOSIS — I10 Essential (primary) hypertension: Secondary | ICD-10-CM | POA: Diagnosis not present

## 2022-11-01 DIAGNOSIS — E785 Hyperlipidemia, unspecified: Secondary | ICD-10-CM | POA: Diagnosis not present

## 2022-11-01 DIAGNOSIS — I4891 Unspecified atrial fibrillation: Secondary | ICD-10-CM | POA: Diagnosis not present

## 2022-11-01 DIAGNOSIS — K649 Unspecified hemorrhoids: Secondary | ICD-10-CM | POA: Diagnosis not present

## 2022-11-01 DIAGNOSIS — G4700 Insomnia, unspecified: Secondary | ICD-10-CM | POA: Diagnosis not present

## 2022-11-01 DIAGNOSIS — J449 Chronic obstructive pulmonary disease, unspecified: Secondary | ICD-10-CM | POA: Diagnosis not present

## 2022-11-01 DIAGNOSIS — K2100 Gastro-esophageal reflux disease with esophagitis, without bleeding: Secondary | ICD-10-CM | POA: Diagnosis not present

## 2022-11-01 DIAGNOSIS — E871 Hypo-osmolality and hyponatremia: Secondary | ICD-10-CM | POA: Diagnosis not present

## 2022-11-01 DIAGNOSIS — L0390 Cellulitis, unspecified: Secondary | ICD-10-CM | POA: Diagnosis not present

## 2022-11-01 DIAGNOSIS — E222 Syndrome of inappropriate secretion of antidiuretic hormone: Secondary | ICD-10-CM | POA: Diagnosis not present

## 2022-11-01 DIAGNOSIS — K5900 Constipation, unspecified: Secondary | ICD-10-CM | POA: Diagnosis not present

## 2022-11-01 DIAGNOSIS — N189 Chronic kidney disease, unspecified: Secondary | ICD-10-CM | POA: Diagnosis not present

## 2022-11-01 DIAGNOSIS — R600 Localized edema: Secondary | ICD-10-CM | POA: Diagnosis not present

## 2022-11-09 DIAGNOSIS — J449 Chronic obstructive pulmonary disease, unspecified: Secondary | ICD-10-CM | POA: Diagnosis not present

## 2022-11-09 DIAGNOSIS — I10 Essential (primary) hypertension: Secondary | ICD-10-CM | POA: Diagnosis not present

## 2022-11-09 DIAGNOSIS — I4891 Unspecified atrial fibrillation: Secondary | ICD-10-CM | POA: Diagnosis not present

## 2022-11-09 DIAGNOSIS — N189 Chronic kidney disease, unspecified: Secondary | ICD-10-CM | POA: Diagnosis not present

## 2022-11-09 DIAGNOSIS — E785 Hyperlipidemia, unspecified: Secondary | ICD-10-CM | POA: Diagnosis not present

## 2022-11-28 DIAGNOSIS — I10 Essential (primary) hypertension: Secondary | ICD-10-CM | POA: Diagnosis not present

## 2022-11-30 DIAGNOSIS — J449 Chronic obstructive pulmonary disease, unspecified: Secondary | ICD-10-CM | POA: Diagnosis not present

## 2022-11-30 DIAGNOSIS — D649 Anemia, unspecified: Secondary | ICD-10-CM | POA: Diagnosis not present

## 2022-11-30 DIAGNOSIS — E871 Hypo-osmolality and hyponatremia: Secondary | ICD-10-CM | POA: Diagnosis not present

## 2022-11-30 DIAGNOSIS — G4733 Obstructive sleep apnea (adult) (pediatric): Secondary | ICD-10-CM | POA: Diagnosis not present

## 2022-11-30 DIAGNOSIS — R600 Localized edema: Secondary | ICD-10-CM | POA: Diagnosis not present

## 2022-11-30 DIAGNOSIS — K5900 Constipation, unspecified: Secondary | ICD-10-CM | POA: Diagnosis not present

## 2022-11-30 DIAGNOSIS — L0390 Cellulitis, unspecified: Secondary | ICD-10-CM | POA: Diagnosis not present

## 2022-11-30 DIAGNOSIS — K2100 Gastro-esophageal reflux disease with esophagitis, without bleeding: Secondary | ICD-10-CM | POA: Diagnosis not present

## 2022-11-30 DIAGNOSIS — I10 Essential (primary) hypertension: Secondary | ICD-10-CM | POA: Diagnosis not present

## 2022-11-30 DIAGNOSIS — N189 Chronic kidney disease, unspecified: Secondary | ICD-10-CM | POA: Diagnosis not present

## 2022-12-06 DIAGNOSIS — E785 Hyperlipidemia, unspecified: Secondary | ICD-10-CM | POA: Diagnosis not present

## 2022-12-06 DIAGNOSIS — N189 Chronic kidney disease, unspecified: Secondary | ICD-10-CM | POA: Diagnosis not present

## 2022-12-06 DIAGNOSIS — I4891 Unspecified atrial fibrillation: Secondary | ICD-10-CM | POA: Diagnosis not present

## 2022-12-06 DIAGNOSIS — I10 Essential (primary) hypertension: Secondary | ICD-10-CM | POA: Diagnosis not present

## 2022-12-06 DIAGNOSIS — J449 Chronic obstructive pulmonary disease, unspecified: Secondary | ICD-10-CM | POA: Diagnosis not present

## 2022-12-11 DIAGNOSIS — R600 Localized edema: Secondary | ICD-10-CM | POA: Diagnosis not present

## 2022-12-11 DIAGNOSIS — D649 Anemia, unspecified: Secondary | ICD-10-CM | POA: Diagnosis not present

## 2022-12-11 DIAGNOSIS — I10 Essential (primary) hypertension: Secondary | ICD-10-CM | POA: Diagnosis not present

## 2022-12-11 DIAGNOSIS — J449 Chronic obstructive pulmonary disease, unspecified: Secondary | ICD-10-CM | POA: Diagnosis not present

## 2022-12-11 DIAGNOSIS — K2100 Gastro-esophageal reflux disease with esophagitis, without bleeding: Secondary | ICD-10-CM | POA: Diagnosis not present

## 2022-12-11 DIAGNOSIS — E222 Syndrome of inappropriate secretion of antidiuretic hormone: Secondary | ICD-10-CM | POA: Diagnosis not present

## 2022-12-11 DIAGNOSIS — K5900 Constipation, unspecified: Secondary | ICD-10-CM | POA: Diagnosis not present

## 2022-12-11 DIAGNOSIS — G4733 Obstructive sleep apnea (adult) (pediatric): Secondary | ICD-10-CM | POA: Diagnosis not present

## 2022-12-11 DIAGNOSIS — N189 Chronic kidney disease, unspecified: Secondary | ICD-10-CM | POA: Diagnosis not present

## 2022-12-13 DIAGNOSIS — E119 Type 2 diabetes mellitus without complications: Secondary | ICD-10-CM | POA: Diagnosis not present

## 2022-12-13 DIAGNOSIS — E559 Vitamin D deficiency, unspecified: Secondary | ICD-10-CM | POA: Diagnosis not present

## 2022-12-13 DIAGNOSIS — I10 Essential (primary) hypertension: Secondary | ICD-10-CM | POA: Diagnosis not present

## 2022-12-13 DIAGNOSIS — D518 Other vitamin B12 deficiency anemias: Secondary | ICD-10-CM | POA: Diagnosis not present

## 2022-12-13 DIAGNOSIS — D649 Anemia, unspecified: Secondary | ICD-10-CM | POA: Diagnosis not present

## 2022-12-25 DIAGNOSIS — R600 Localized edema: Secondary | ICD-10-CM | POA: Diagnosis not present

## 2022-12-25 DIAGNOSIS — E222 Syndrome of inappropriate secretion of antidiuretic hormone: Secondary | ICD-10-CM | POA: Diagnosis not present

## 2022-12-25 DIAGNOSIS — N189 Chronic kidney disease, unspecified: Secondary | ICD-10-CM | POA: Diagnosis not present

## 2022-12-25 DIAGNOSIS — G4700 Insomnia, unspecified: Secondary | ICD-10-CM | POA: Diagnosis not present

## 2022-12-25 DIAGNOSIS — R059 Cough, unspecified: Secondary | ICD-10-CM | POA: Diagnosis not present

## 2022-12-25 DIAGNOSIS — J449 Chronic obstructive pulmonary disease, unspecified: Secondary | ICD-10-CM | POA: Diagnosis not present

## 2022-12-25 DIAGNOSIS — L853 Xerosis cutis: Secondary | ICD-10-CM | POA: Diagnosis not present

## 2022-12-25 DIAGNOSIS — K5900 Constipation, unspecified: Secondary | ICD-10-CM | POA: Diagnosis not present

## 2022-12-28 DIAGNOSIS — R059 Cough, unspecified: Secondary | ICD-10-CM | POA: Diagnosis not present

## 2022-12-29 DIAGNOSIS — I10 Essential (primary) hypertension: Secondary | ICD-10-CM | POA: Diagnosis not present

## 2022-12-30 DIAGNOSIS — R509 Fever, unspecified: Secondary | ICD-10-CM | POA: Diagnosis not present

## 2022-12-31 DIAGNOSIS — R059 Cough, unspecified: Secondary | ICD-10-CM | POA: Diagnosis not present

## 2023-01-01 DIAGNOSIS — K2100 Gastro-esophageal reflux disease with esophagitis, without bleeding: Secondary | ICD-10-CM | POA: Diagnosis not present

## 2023-01-01 DIAGNOSIS — D649 Anemia, unspecified: Secondary | ICD-10-CM | POA: Diagnosis not present

## 2023-01-01 DIAGNOSIS — J22 Unspecified acute lower respiratory infection: Secondary | ICD-10-CM | POA: Diagnosis not present

## 2023-01-01 DIAGNOSIS — E871 Hypo-osmolality and hyponatremia: Secondary | ICD-10-CM | POA: Diagnosis not present

## 2023-01-01 DIAGNOSIS — I10 Essential (primary) hypertension: Secondary | ICD-10-CM | POA: Diagnosis not present

## 2023-01-01 DIAGNOSIS — E785 Hyperlipidemia, unspecified: Secondary | ICD-10-CM | POA: Diagnosis not present

## 2023-01-01 DIAGNOSIS — G629 Polyneuropathy, unspecified: Secondary | ICD-10-CM | POA: Diagnosis not present

## 2023-01-01 DIAGNOSIS — J449 Chronic obstructive pulmonary disease, unspecified: Secondary | ICD-10-CM | POA: Diagnosis not present

## 2023-01-01 DIAGNOSIS — I4891 Unspecified atrial fibrillation: Secondary | ICD-10-CM | POA: Diagnosis not present

## 2023-01-01 DIAGNOSIS — K5900 Constipation, unspecified: Secondary | ICD-10-CM | POA: Diagnosis not present

## 2023-01-01 DIAGNOSIS — G4733 Obstructive sleep apnea (adult) (pediatric): Secondary | ICD-10-CM | POA: Diagnosis not present

## 2023-01-01 DIAGNOSIS — R600 Localized edema: Secondary | ICD-10-CM | POA: Diagnosis not present

## 2023-01-02 DIAGNOSIS — R059 Cough, unspecified: Secondary | ICD-10-CM | POA: Diagnosis not present

## 2023-01-02 DIAGNOSIS — R509 Fever, unspecified: Secondary | ICD-10-CM | POA: Diagnosis not present

## 2023-01-03 DIAGNOSIS — D649 Anemia, unspecified: Secondary | ICD-10-CM | POA: Diagnosis not present

## 2023-01-03 DIAGNOSIS — G4700 Insomnia, unspecified: Secondary | ICD-10-CM | POA: Diagnosis not present

## 2023-01-03 DIAGNOSIS — K5900 Constipation, unspecified: Secondary | ICD-10-CM | POA: Diagnosis not present

## 2023-01-03 DIAGNOSIS — E222 Syndrome of inappropriate secretion of antidiuretic hormone: Secondary | ICD-10-CM | POA: Diagnosis not present

## 2023-01-03 DIAGNOSIS — R600 Localized edema: Secondary | ICD-10-CM | POA: Diagnosis not present

## 2023-01-03 DIAGNOSIS — E871 Hypo-osmolality and hyponatremia: Secondary | ICD-10-CM | POA: Diagnosis not present

## 2023-01-03 DIAGNOSIS — J22 Unspecified acute lower respiratory infection: Secondary | ICD-10-CM | POA: Diagnosis not present

## 2023-01-03 DIAGNOSIS — K2100 Gastro-esophageal reflux disease with esophagitis, without bleeding: Secondary | ICD-10-CM | POA: Diagnosis not present

## 2023-01-03 DIAGNOSIS — R5381 Other malaise: Secondary | ICD-10-CM | POA: Diagnosis not present

## 2023-01-03 DIAGNOSIS — N189 Chronic kidney disease, unspecified: Secondary | ICD-10-CM | POA: Diagnosis not present

## 2023-01-03 DIAGNOSIS — J449 Chronic obstructive pulmonary disease, unspecified: Secondary | ICD-10-CM | POA: Diagnosis not present

## 2023-01-03 DIAGNOSIS — G4733 Obstructive sleep apnea (adult) (pediatric): Secondary | ICD-10-CM | POA: Diagnosis not present

## 2023-01-04 DIAGNOSIS — E871 Hypo-osmolality and hyponatremia: Secondary | ICD-10-CM | POA: Diagnosis not present

## 2023-01-08 ENCOUNTER — Emergency Department (HOSPITAL_COMMUNITY): Payer: No Typology Code available for payment source

## 2023-01-08 ENCOUNTER — Other Ambulatory Visit: Payer: Self-pay

## 2023-01-08 ENCOUNTER — Inpatient Hospital Stay (HOSPITAL_COMMUNITY)
Admission: EM | Admit: 2023-01-08 | Discharge: 2023-01-12 | DRG: 189 | Disposition: A | Discharge status: Skilled Nursing Facility | Payer: No Typology Code available for payment source | Source: Skilled Nursing Facility | Attending: Family Medicine | Admitting: Family Medicine

## 2023-01-08 ENCOUNTER — Observation Stay (HOSPITAL_COMMUNITY): Payer: No Typology Code available for payment source

## 2023-01-08 DIAGNOSIS — I2510 Atherosclerotic heart disease of native coronary artery without angina pectoris: Secondary | ICD-10-CM | POA: Diagnosis present

## 2023-01-08 DIAGNOSIS — E46 Unspecified protein-calorie malnutrition: Secondary | ICD-10-CM | POA: Diagnosis present

## 2023-01-08 DIAGNOSIS — J441 Chronic obstructive pulmonary disease with (acute) exacerbation: Secondary | ICD-10-CM | POA: Diagnosis not present

## 2023-01-08 DIAGNOSIS — Z888 Allergy status to other drugs, medicaments and biological substances status: Secondary | ICD-10-CM

## 2023-01-08 DIAGNOSIS — Z86711 Personal history of pulmonary embolism: Secondary | ICD-10-CM

## 2023-01-08 DIAGNOSIS — G4733 Obstructive sleep apnea (adult) (pediatric): Secondary | ICD-10-CM | POA: Diagnosis present

## 2023-01-08 DIAGNOSIS — J189 Pneumonia, unspecified organism: Secondary | ICD-10-CM

## 2023-01-08 DIAGNOSIS — J440 Chronic obstructive pulmonary disease with acute lower respiratory infection: Secondary | ICD-10-CM | POA: Diagnosis present

## 2023-01-08 DIAGNOSIS — Z6825 Body mass index (BMI) 25.0-25.9, adult: Secondary | ICD-10-CM

## 2023-01-08 DIAGNOSIS — K219 Gastro-esophageal reflux disease without esophagitis: Secondary | ICD-10-CM | POA: Diagnosis present

## 2023-01-08 DIAGNOSIS — Z9981 Dependence on supplemental oxygen: Secondary | ICD-10-CM

## 2023-01-08 DIAGNOSIS — I10 Essential (primary) hypertension: Secondary | ICD-10-CM | POA: Diagnosis present

## 2023-01-08 DIAGNOSIS — Z85828 Personal history of other malignant neoplasm of skin: Secondary | ICD-10-CM

## 2023-01-08 DIAGNOSIS — E785 Hyperlipidemia, unspecified: Secondary | ICD-10-CM | POA: Diagnosis present

## 2023-01-08 DIAGNOSIS — Z955 Presence of coronary angioplasty implant and graft: Secondary | ICD-10-CM

## 2023-01-08 DIAGNOSIS — J9601 Acute respiratory failure with hypoxia: Secondary | ICD-10-CM | POA: Diagnosis not present

## 2023-01-08 DIAGNOSIS — E222 Syndrome of inappropriate secretion of antidiuretic hormone: Secondary | ICD-10-CM | POA: Diagnosis present

## 2023-01-08 DIAGNOSIS — Z66 Do not resuscitate: Secondary | ICD-10-CM | POA: Diagnosis present

## 2023-01-08 DIAGNOSIS — R600 Localized edema: Secondary | ICD-10-CM | POA: Diagnosis not present

## 2023-01-08 DIAGNOSIS — Z87891 Personal history of nicotine dependence: Secondary | ICD-10-CM

## 2023-01-08 DIAGNOSIS — I252 Old myocardial infarction: Secondary | ICD-10-CM

## 2023-01-08 DIAGNOSIS — K2100 Gastro-esophageal reflux disease with esophagitis, without bleeding: Secondary | ICD-10-CM | POA: Diagnosis not present

## 2023-01-08 DIAGNOSIS — R059 Cough, unspecified: Secondary | ICD-10-CM | POA: Diagnosis not present

## 2023-01-08 DIAGNOSIS — J851 Abscess of lung with pneumonia: Secondary | ICD-10-CM | POA: Diagnosis present

## 2023-01-08 DIAGNOSIS — J22 Unspecified acute lower respiratory infection: Secondary | ICD-10-CM | POA: Diagnosis not present

## 2023-01-08 DIAGNOSIS — R5381 Other malaise: Secondary | ICD-10-CM | POA: Diagnosis not present

## 2023-01-08 DIAGNOSIS — H9193 Unspecified hearing loss, bilateral: Secondary | ICD-10-CM | POA: Diagnosis present

## 2023-01-08 DIAGNOSIS — G4700 Insomnia, unspecified: Secondary | ICD-10-CM | POA: Diagnosis not present

## 2023-01-08 DIAGNOSIS — J449 Chronic obstructive pulmonary disease, unspecified: Secondary | ICD-10-CM | POA: Diagnosis not present

## 2023-01-08 DIAGNOSIS — Z85820 Personal history of malignant melanoma of skin: Secondary | ICD-10-CM

## 2023-01-08 DIAGNOSIS — I4891 Unspecified atrial fibrillation: Secondary | ICD-10-CM | POA: Diagnosis present

## 2023-01-08 DIAGNOSIS — K5900 Constipation, unspecified: Secondary | ICD-10-CM | POA: Diagnosis not present

## 2023-01-08 DIAGNOSIS — Z86718 Personal history of other venous thrombosis and embolism: Secondary | ICD-10-CM

## 2023-01-08 DIAGNOSIS — E860 Dehydration: Secondary | ICD-10-CM | POA: Diagnosis not present

## 2023-01-08 DIAGNOSIS — I471 Supraventricular tachycardia, unspecified: Secondary | ICD-10-CM | POA: Diagnosis present

## 2023-01-08 DIAGNOSIS — G629 Polyneuropathy, unspecified: Secondary | ICD-10-CM | POA: Diagnosis present

## 2023-01-08 DIAGNOSIS — Z1152 Encounter for screening for COVID-19: Secondary | ICD-10-CM

## 2023-01-08 DIAGNOSIS — J9691 Respiratory failure, unspecified with hypoxia: Secondary | ICD-10-CM | POA: Diagnosis not present

## 2023-01-08 DIAGNOSIS — Z95828 Presence of other vascular implants and grafts: Secondary | ICD-10-CM

## 2023-01-08 DIAGNOSIS — D649 Anemia, unspecified: Secondary | ICD-10-CM | POA: Diagnosis not present

## 2023-01-08 DIAGNOSIS — R062 Wheezing: Secondary | ICD-10-CM | POA: Diagnosis not present

## 2023-01-08 DIAGNOSIS — N189 Chronic kidney disease, unspecified: Secondary | ICD-10-CM | POA: Diagnosis not present

## 2023-01-08 DIAGNOSIS — Z79899 Other long term (current) drug therapy: Secondary | ICD-10-CM

## 2023-01-08 LAB — BASIC METABOLIC PANEL
Anion gap: 9 (ref 5–15)
BUN: 27 mg/dL — ABNORMAL HIGH (ref 8–23)
CO2: 22 mmol/L (ref 22–32)
Calcium: 8.4 mg/dL — ABNORMAL LOW (ref 8.9–10.3)
Chloride: 101 mmol/L (ref 98–111)
Creatinine, Ser: 1.14 mg/dL (ref 0.61–1.24)
GFR, Estimated: >60 mL/min (ref >60)
Glucose, Bld: 108 mg/dL — ABNORMAL HIGH (ref 70–99)
Potassium: 4.7 mmol/L (ref 3.5–5.1)
Sodium: 132 mmol/L — ABNORMAL LOW (ref 135–145)

## 2023-01-08 LAB — CBC WITH DIFFERENTIAL/PLATELET
Abs Immature Granulocytes: 0.26 10*3/uL — ABNORMAL HIGH (ref 0.00–0.07)
Basophils Absolute: 0.1 10*3/uL (ref 0.0–0.1)
Basophils Relative: 0 %
Eosinophils Absolute: 0.1 10*3/uL (ref 0.0–0.5)
Eosinophils Relative: 0 %
HCT: 34.8 % — ABNORMAL LOW (ref 39.0–52.0)
Hemoglobin: 12.2 g/dL — ABNORMAL LOW (ref 13.0–17.0)
Immature Granulocytes: 1 %
Lymphocytes Relative: 10 %
Lymphs Abs: 2.1 10*3/uL (ref 0.7–4.0)
MCH: 31.8 pg (ref 26.0–34.0)
MCHC: 35.1 g/dL (ref 30.0–36.0)
MCV: 90.6 fL (ref 80.0–100.0)
Monocytes Absolute: 1.3 10*3/uL — ABNORMAL HIGH (ref 0.1–1.0)
Monocytes Relative: 6 %
Neutro Abs: 16.6 10*3/uL — ABNORMAL HIGH (ref 1.7–7.7)
Neutrophils Relative %: 83 %
Platelets: 360 10*3/uL (ref 150–400)
RBC: 3.84 MIL/uL — ABNORMAL LOW (ref 4.22–5.81)
RDW: 13.2 % (ref 11.5–15.5)
WBC: 20.3 10*3/uL — ABNORMAL HIGH (ref 4.0–10.5)
nRBC: 0 % (ref 0.0–0.2)

## 2023-01-08 LAB — RESP PANEL BY RT-PCR (RSV, FLU A&B, COVID)  RVPGX2
Influenza A by PCR: NEGATIVE
Influenza B by PCR: NEGATIVE
Resp Syncytial Virus by PCR: NEGATIVE
SARS Coronavirus 2 by RT PCR: NEGATIVE

## 2023-01-08 LAB — BRAIN NATRIURETIC PEPTIDE: B Natriuretic Peptide: 144.8 pg/mL — ABNORMAL HIGH (ref 0.0–100.0)

## 2023-01-08 MED ORDER — ACETAMINOPHEN 325 MG PO TABS
650.0000 mg | ORAL_TABLET | Freq: Four times a day (QID) | ORAL | Status: DC | PRN
  Administered 2023-01-10 – 2023-01-11 (×2): 650 mg via ORAL
  Filled 2023-01-08 (×3): qty 2

## 2023-01-08 MED ORDER — SODIUM CHLORIDE 0.9 % IV SOLN
2.0000 g | Freq: Once | INTRAVENOUS | Status: AC
  Administered 2023-01-08: 2 g via INTRAVENOUS
  Filled 2023-01-08: qty 12.5

## 2023-01-08 MED ORDER — ASPIRIN 81 MG PO TBEC
81.0000 mg | DELAYED_RELEASE_TABLET | Freq: Every day | ORAL | Status: DC
  Administered 2023-01-09 – 2023-01-12 (×4): 81 mg via ORAL
  Filled 2023-01-08 (×4): qty 1

## 2023-01-08 MED ORDER — SODIUM CHLORIDE 0.9 % IV SOLN
500.0000 mg | Freq: Once | INTRAVENOUS | Status: AC
  Administered 2023-01-08: 500 mg via INTRAVENOUS
  Filled 2023-01-08: qty 5

## 2023-01-08 MED ORDER — LISINOPRIL 10 MG PO TABS
10.0000 mg | ORAL_TABLET | Freq: Every day | ORAL | Status: DC
  Administered 2023-01-08 – 2023-01-12 (×5): 10 mg via ORAL
  Filled 2023-01-08 (×5): qty 1

## 2023-01-08 MED ORDER — SODIUM CHLORIDE 0.9 % IV SOLN
2.0000 g | Freq: Two times a day (BID) | INTRAVENOUS | Status: DC
  Administered 2023-01-09: 2 g via INTRAVENOUS
  Filled 2023-01-08: qty 12.5

## 2023-01-08 MED ORDER — FERROUS SULFATE 325 (65 FE) MG PO TABS
325.0000 mg | ORAL_TABLET | ORAL | Status: DC
  Administered 2023-01-10 – 2023-01-12 (×2): 325 mg via ORAL
  Filled 2023-01-08 (×2): qty 1

## 2023-01-08 MED ORDER — PANTOPRAZOLE SODIUM 40 MG PO TBEC
40.0000 mg | DELAYED_RELEASE_TABLET | Freq: Two times a day (BID) | ORAL | Status: DC
  Administered 2023-01-09 – 2023-01-12 (×8): 40 mg via ORAL
  Filled 2023-01-08 (×8): qty 1

## 2023-01-08 MED ORDER — BRINZOLAMIDE 1 % OP SUSP
1.0000 [drp] | Freq: Three times a day (TID) | OPHTHALMIC | Status: DC
  Administered 2023-01-08 – 2023-01-12 (×12): 1 [drp] via OPHTHALMIC
  Filled 2023-01-08: qty 10

## 2023-01-08 MED ORDER — ENOXAPARIN SODIUM 40 MG/0.4ML IJ SOSY
40.0000 mg | PREFILLED_SYRINGE | INTRAMUSCULAR | Status: DC
  Administered 2023-01-08 – 2023-01-11 (×4): 40 mg via SUBCUTANEOUS
  Filled 2023-01-08 (×4): qty 0.4

## 2023-01-08 MED ORDER — PREDNISONE 20 MG PO TABS
40.0000 mg | ORAL_TABLET | Freq: Every day | ORAL | Status: AC
  Administered 2023-01-09 – 2023-01-12 (×4): 40 mg via ORAL
  Filled 2023-01-08 (×4): qty 2

## 2023-01-08 MED ORDER — TAMSULOSIN HCL 0.4 MG PO CAPS
0.4000 mg | ORAL_CAPSULE | Freq: Every day | ORAL | Status: DC
  Administered 2023-01-08 – 2023-01-11 (×4): 0.4 mg via ORAL
  Filled 2023-01-08 (×4): qty 1

## 2023-01-08 MED ORDER — METHYLPREDNISOLONE SODIUM SUCC 125 MG IJ SOLR
125.0000 mg | Freq: Once | INTRAMUSCULAR | Status: AC
  Administered 2023-01-08: 125 mg via INTRAVENOUS
  Filled 2023-01-08: qty 2

## 2023-01-08 MED ORDER — POLYVINYL ALCOHOL 1.4 % OP SOLN: 1.0000 [drp] | Freq: Every day | OPHTHALMIC | Status: DC | PRN

## 2023-01-08 MED ORDER — GABAPENTIN 300 MG PO CAPS
300.0000 mg | ORAL_CAPSULE | Freq: Two times a day (BID) | ORAL | Status: DC
  Administered 2023-01-08 – 2023-01-12 (×8): 300 mg via ORAL
  Filled 2023-01-08 (×8): qty 1

## 2023-01-08 MED ORDER — ALBUTEROL SULFATE (2.5 MG/3ML) 0.083% IN NEBU
2.5000 mg | INHALATION_SOLUTION | Freq: Once | RESPIRATORY_TRACT | Status: AC
  Administered 2023-01-08: 2.5 mg via RESPIRATORY_TRACT
  Filled 2023-01-08: qty 3

## 2023-01-08 MED ORDER — BRIMONIDINE TARTRATE 0.2 % OP SOLN
1.0000 [drp] | Freq: Three times a day (TID) | OPHTHALMIC | Status: DC
  Administered 2023-01-08 – 2023-01-12 (×12): 1 [drp] via OPHTHALMIC
  Filled 2023-01-08: qty 5

## 2023-01-08 MED ORDER — GUAIFENESIN ER 600 MG PO TB12
600.0000 mg | ORAL_TABLET | Freq: Two times a day (BID) | ORAL | Status: DC
  Administered 2023-01-08 – 2023-01-12 (×8): 600 mg via ORAL
  Filled 2023-01-08 (×8): qty 1

## 2023-01-08 MED ORDER — IOHEXOL 350 MG/ML SOLN
75.0000 mL | Freq: Once | INTRAVENOUS | Status: AC | PRN
  Administered 2023-01-08: 75 mL via INTRAVENOUS

## 2023-01-08 MED ORDER — LACTATED RINGERS IV SOLN: INTRAVENOUS | Status: AC

## 2023-01-08 MED ORDER — LACTATED RINGERS IV SOLN: INTRAVENOUS | Status: DC

## 2023-01-08 MED ORDER — ATORVASTATIN CALCIUM 10 MG PO TABS
20.0000 mg | ORAL_TABLET | Freq: Every evening | ORAL | Status: DC
  Administered 2023-01-08 – 2023-01-12 (×5): 20 mg via ORAL
  Filled 2023-01-08 (×5): qty 2

## 2023-01-08 MED ORDER — IPRATROPIUM-ALBUTEROL 0.5-2.5 (3) MG/3ML IN SOLN
3.0000 mL | Freq: Four times a day (QID) | RESPIRATORY_TRACT | Status: DC
  Administered 2023-01-08 – 2023-01-11 (×13): 3 mL via RESPIRATORY_TRACT
  Filled 2023-01-08 (×13): qty 3

## 2023-01-08 MED ORDER — ALBUTEROL SULFATE (2.5 MG/3ML) 0.083% IN NEBU
2.5000 mg | INHALATION_SOLUTION | RESPIRATORY_TRACT | Status: DC | PRN
  Administered 2023-01-08 – 2023-01-11 (×2): 2.5 mg via RESPIRATORY_TRACT
  Filled 2023-01-08 (×2): qty 3

## 2023-01-08 MED ORDER — LATANOPROST 0.005 % OP SOLN
1.0000 [drp] | Freq: Every day | OPHTHALMIC | Status: DC
  Administered 2023-01-08 – 2023-01-11 (×4): 1 [drp] via OPHTHALMIC
  Filled 2023-01-08: qty 2.5

## 2023-01-08 NOTE — ED Provider Notes (Signed)
[facility] Provider Note   CSN: [id number] Arrival date & time: 01/08/23  1142     History  Chief Complaint  Patient presents with   Shortness of Breath    Marcus Martin is a 89 y.o. male.  HPI     89 year old male comes in with chief complaint of shortness of breath.  He resides at a nursing home, [facility].  Patient has had respiratory symptoms that have been persistent for 3 to 4 weeks.  He states that he now has increased work of breathing and cough that is producing clear, thick phlegm.  His cough is worsened.  He has been on antibiotics and he thinks on prednisone, but just not getting better.  Patient has a history of COPD and is on chronic oxygen and CPAP at nighttime. Spoke with patient's son, he states that patient was just taken off of antibiotics either today or yesterday.  He agrees that his father's breathing has just worsened and not responded despite outpatient management.  Home Medications Prior to Admission medications   Medication Sig Start Date End Date Taking? Authorizing Provider  acetaminophen (TYLENOL) 500 MG tablet Take 500 mg by mouth 3 (three) times daily as needed for mild pain, headache or fever.    [provider]  albuterol (VENTOLIN HFA) 108 (90 Base) MCG/ACT inhaler Inhale 2 puffs into the lungs every 6 (six) hours as needed for wheezing or shortness of breath.    [provider]  amLODipine (NORVASC) 5 MG tablet Take 1 tablet (5 mg total) by mouth daily. 11/09/21   Virl Axe, MD  apixaban (ELIQUIS) 5 MG TABS tablet Take 5 mg by mouth 2 (two) times daily.    [provider]  atorvastatin (LIPITOR) 20 MG tablet Take 20 mg by mouth every evening.    [provider]  B Complex Vitamins (B COMPLEX PO) Take 1 tablet by mouth daily.    [provider]  Baclofen 5 MG TABS Take 5 mg by mouth 2 (two) times daily as needed (muscle spasms). 11/09/21   [provider]  Brinzolamide-Brimonidine (SIMBRINZA) 1-0.2 % SUSP Place 1 drop into both eyes in the morning and at bedtime.    [provider]  Calcium Carb-Cholecalciferol (CALCIUM 500/VITAMIN D PO) Take 1 tablet by mouth daily.    [provider]  Cholecalciferol (VITAMIN D3) 25 MCG (1000 UT) CAPS Take 1,000 Units by mouth daily.    [provider]  Ensure (ENSURE) Take 237 mLs by mouth in the morning and at bedtime.    [provider]  ferrous sulfate 325 (65 FE) MG tablet Take 325 mg by mouth every Monday, Wednesday, and Friday.    [provider]  gabapentin (NEURONTIN) 300 MG capsule Take 2 capsules (600 mg total) by mouth at bedtime. Separate '400mg'$  dosage for daytime. 11/19/21   Virl Axe, MD  gabapentin (NEURONTIN) 400 MG capsule Take 1 capsule (400 mg total) by mouth every morning. Separate '300mg'$  dosage for bedtime. 11/19/21   Virl Axe, MD  latanoprost (XALATAN) 0.005 % ophthalmic solution Place 1 drop into both eyes at bedtime.    [provider]  lisinopril (ZESTRIL) 40 MG tablet Take 1 tablet (40 mg total) by mouth daily. 11/09/21   Virl Axe, MD  LUTEIN PO Take 1 capsule by mouth daily.    [provider]  Multiple Vitamin (MULTIVITAMIN) tablet Take 1 tablet by mouth daily.    [provider]  Neomycin-Bacitracin-Polymyxin (TRIPLE ANTIBIOTIC) OINT Place 1 application onto the skin 2 (two) times daily as needed (minor wounds/cuts).    [provider]  pantoprazole (PROTONIX) 40 MG tablet Take 1 tablet (40 mg total) by mouth 2 (two) times daily before a meal. 11/08/21   Virl Axe, MD  polyvinyl alcohol (LIQUIFILM TEARS) 1.4 % ophthalmic solution Place 1 drop into both eyes daily as needed for dry eyes.    [provider]  Probiotic Product (PROBIOTIC PO) Take 1 capsule by mouth daily.    [provider]  silver sulfADIAZINE (SILVADENE) 1 % cream Apply 1 application  topically in the morning and at bedtime.    [provider]  sodium chloride 1 g tablet Take 1 tablet (1 g total) by mouth 3 (three) times daily with meals. 11/08/21   Virl Axe, MD  sucralfate (CARAFATE) 1 GM/10ML suspension Take 10 mLs (1 g total) by mouth 4 (four) times daily -  with meals and at bedtime. 11/08/21   Virl Axe, MD  tamsulosin (FLOMAX) 0.4 MG CAPS capsule Take 0.4 mg by mouth at bedtime. Hold for SBP <110    [provider]  triamcinolone (KENALOG) 0.025 % cream Apply 1 application topically 2 (two) times daily.    [provider]  vitamin C (ASCORBIC ACID) 500 MG tablet Take 500 mg by mouth daily.    [provider]      Allergies    Ticagrelor, Tetanus toxoids, Coffea arabica, Niacin, Niacin and related, Niacin er, Simvastatin, and Tiotropium    Review of Systems   Review of Systems  All other systems reviewed and are negative.   Physical Exam Updated Vital Signs BP (!) 165/59   Pulse 91   Temp 98.5 F (36.9 C) (Oral)   Resp 19   Ht '5\' 10"'$  (1.778 m)   Wt 79.4 kg   SpO2 100%   BMI 25.12 kg/m  Physical Exam Vitals and nursing note reviewed.  Constitutional:      Appearance: He is well-developed.  HENT:     Head: Atraumatic.  Cardiovascular:     Rate and Rhythm: Normal rate.  Pulmonary:     Effort: Pulmonary effort is normal.     Breath sounds: Examination of the right-lower field reveals rhonchi. Examination of the left-lower field reveals rhonchi. Decreased breath sounds, wheezing and rhonchi present.  Musculoskeletal:     Cervical back: Neck supple.  Skin:    General: Skin is warm.  Neurological:     Mental Status: He is alert and oriented to person, place, and time.     ED Results / Procedures / Treatments   Labs (all labs ordered are listed, but only abnormal results are displayed) Labs Reviewed  BASIC METABOLIC PANEL - Abnormal; Notable for the following components:      Result Value   Sodium 132  (*)    Glucose, Bld 108 (*)    BUN 27 (*)    Calcium 8.4 (*)    All other components within normal limits  CBC WITH DIFFERENTIAL/PLATELET - Abnormal; Notable for the following components:   WBC 20.3 (*)    RBC 3.84 (*)    Hemoglobin 12.2 (*)    HCT 34.8 (*)    Neutro Abs 16.6 (*)    Monocytes Absolute 1.3 (*)    Abs Immature Granulocytes 0.26 (*)    All other components within normal limits  BRAIN NATRIURETIC PEPTIDE - Abnormal; Notable for the following components:  B Natriuretic Peptide 144.8 (*)    All other components within normal limits  RESP PANEL BY RT-PCR (RSV, FLU A&B, COVID)  RVPGX2  I-STAT VENOUS BLOOD GAS, ED    EKG EKG Interpretation  Date/Time:  Monday January 08 2023 11:49:09 EST Ventricular Rate:  99 PR Interval:  224 QRS Duration: 80 QT Interval:  322 QTC Calculation: 414 R Axis:   71 Text Interpretation: Sinus rhythm Prolonged PR interval No acute changes No significant change since last tracing Confirmed by Varney Biles ([id number]) on 01/08/2023 12:46:26 PM  Radiology DG Chest Port 1 View  Result Date: 01/08/2023 CLINICAL DATA:  Short of breath. Respiratory complaints for approximately 1 week. EXAM: PORTABLE CHEST 1 VIEW COMPARISON:  11/16/2021. FINDINGS: Cardiac silhouette is normal in size. No mediastinal or hilar masses. Lungs are hyperexpanded. Mild prominence of the interstitial markings. No lung consolidation or evidence of edema. No convincing pleural effusion or pneumothorax. Skeletal structures are grossly intact. IMPRESSION: No acute cardiopulmonary disease. Electronically Signed   By: Lajean Manes M.D.   On: 01/08/2023 13:39    Procedures .Critical Care  Performed by: Varney Biles, MD Authorized by: Varney Biles, MD   Critical care provider statement:    Critical care time (minutes):  36   Critical care was necessary to treat or prevent imminent or life-threatening deterioration of the following conditions:  Respiratory failure    Critical care was time spent personally by me on the following activities:  Development of treatment plan with patient or surrogate, discussions with consultants, evaluation of patient's response to treatment, examination of patient, ordering and review of laboratory studies, ordering and review of radiographic studies, ordering and performing treatments and interventions, pulse oximetry, re-evaluation of patient's condition, review of old charts and obtaining history from patient or surrogate     Medications Ordered in ED Medications  lactated ringers infusion (has no administration in time range)  ceFEPIme (MAXIPIME) 2 g in sodium chloride 0.9 % 100 mL IVPB (has no administration in time range)  azithromycin (ZITHROMAX) 500 mg in sodium chloride 0.9 % 250 mL IVPB (has no administration in time range)  methylPREDNISolone sodium succinate (SOLU-MEDROL) 125 mg/2 mL injection 125 mg (has no administration in time range)  albuterol (PROVENTIL) (2.5 MG/3ML) 0.083% nebulizer solution 2.5 mg (2.5 mg Nebulization Given 01/08/23 1437)    ED Course/ Medical Decision Making/ A&P                             Medical Decision Making Amount and/or Complexity of Data Reviewed Labs: ordered. Radiology: ordered.  Risk Prescription drug management. Decision regarding hospitalization.   89 year old male comes in with chief complaint of increased shortness of breath, worsening cough.  He has history of chronic COPD, on 2 L of oxygen and CPAP at night.  History provided by the patient.  History also provided by the nursing home and son who I called.  Patient states that he is feeling some heaviness over his chest and feels short of breath, but it has become his new normal now.  His cough has worsened.  Lung exam reveals poor aeration diffusely, but he is having wheezing diffusely and rhonchorous breath sounds at the base.  Differential diagnosis considered includes pneumonia, pleural effusion, COPD  exacerbation. It is unclear to the patient if he has been on antibiotics or prednisone.  5:10 PM X-ray of the chest was independently interpreted.  There is no clear evidence of focal consolidation.  Patient's white count on independent lab interpretation is elevated at over 20.  Questionable if it is because of a pneumonia or because of steroids.  I spoke with patient's son again.  He stated that patient was just put on prednisone.  He also indicated that patient just finished a course of oral antibiotics.  I think it would be best to admit the patient has COPD exacerbation with failed outpatient therapy.  We will give him cefepime to cover for Pseudomonas over here, along with azithromycin.  Clinically not septic.   Final Clinical Impression(s) / ED Diagnoses Final diagnoses:  COPD exacerbation ([place])  Community acquired pneumonia, unspecified laterality    Rx / DC Orders ED Discharge Orders     None         Varney Biles, MD 01/08/23 1711

## 2023-01-08 NOTE — H&P (Cosign Needed Addendum)
Hospital Admission History and Physical Service Pager: [phone]  Patient name: Marcus Martin Medical record number: [id number] Date of Birth: [birth year] Age: 89 y.o. Gender: male  Primary Care Provider: Corrington, Delsa Grana, MD Consultants: None Code Status: DNR/DNI which was confirmed with family if patient unable to confirm (called healthcare power of attorney Daren Bruss and confirmed this) Preferred Emergency Contact:  Daren Aubry [phone]  Chief Complaint: Shortness of breath and cough for 1 week  Assessment and Plan: [name] is a 89 y.o. male PMH SVT, NSTEMI s/p PCI, COPD, hypertension, HLD, atrial fibrillation, DVT/PE s/p IVC filter placement (previously on Eliquis), chronic hyponatremia, squamous cell carcinoma of the skin, GERD, peripheral neuropathy, melanoma and bilateral hearing loss presenting with shortness of breath and cough for 1 week. Differential for this patient's presentation of this includes COPD exacerbation which is most likely given patient is having wheezing and productive sounding cough.  CAP however patient's chest x-ray was overall negative for a consolidation.  Pulmonary embolism given patient has had a history of recurrent DVT and PE (not taking eliquis per [facility]) although patient does not have tachycardia.  Viral illness however swabs have been negative.  * Acute hypoxic respiratory failure (HCC) Baseline of no oxygen per assisted living facility and son. Patient presenting with productive cough, increased sputum and wheezing on examination most consistent with COP exacerbation. Patient had been treated with 7 days of Augmentin outpatient but did not receive steroids per facility however son says that he thought that he was on steroids.  WBC of 20, some concern for pneumonia although chest x-ray seems to be more hyperexpansion.  PE possibility as well given hx of recurrent PE and no improvement on augmentin patient does have IVC filter the  facility did not confirm Eliquis for me although it does show up as an active med in [place] system per med rec. Will treat as patient who has failed outpatient course and plan to transition to p.o. tomorrow if patient is doing better. -Admit to FPTS, MedSurg, attending Dr. Nori Riis -S/p Solu-Medrol -start prednisone course 40 mg daily tomorrow for 5 days -Continue azithromycin and cefepime started in the ED -DuoNebs every 6 hours -Albuterol 2 puffs every 4 hours as needed -Wean oxygen as tolerated -CTPE STAT -Incentive spirometry -Monitor fever curve -Lovenox DVT prophylaxis > transition to heparin drip if CTPE not performed in 2-3 hours  Chronic conditions SVT NSTEMI s/p PCI-ASA '81mg'$  HTN-Lisinopril 10 mg daily HLD-atorvastatin 20 mg Atrial fibrillation-?eliquis DVT/PE s/p IVC filter placement (?Eliquis) GERD-protonix  Peripheral neuropathy-gabapentin 300 BID Dry eyes-eye drops reorderd  FEN/GI: Regular diet VTE Prophylaxis: Lovenox  Disposition: MedSurg  History of Present Illness:  Marcus Martin is a 89 y.o. male presenting with shortness of breath and cough   Called Daren Cosaart (son) and confirmed DOB. Per son he says that patient started experiencing respiratory illness 1 week ago. Lives in assited facility [facility]). Tested at facility and viral panels and chest x ray at that time were negative. They put him on an antibiotic (Augmentin) > never got better over next week. Last night ran a low grad fever (100.3) and oxygen level (does not use oxygen at all -so this is a new requirement this week) dipping a bit and pulse rate going up so they monitored through the night and this morning the PA at the facility reviewed him and asked dad if he wanted to go to hospital. . He says he was on steroid this week as well (  [facility] and they said that he was not on any steroids this week). They were concerned about sodium being low as well as he has had a history of  hyponatremia  If needing additonal information: Ask for assisted living [facility] [phone]  In the ED, patient remained afebrile, saturating well on 2 L nasal cannula.  Mildly hypertensive.  Negative viral swabs and mildly elevated BNP.  Sodium was 132, white count elevated at 40.0 with neutrophilic shift. Given solumedrol 125 x 1, albuterol and started on IV azithromycin and cefepime.   Review Of Systems: Per HPI with the following additions:   Pertinent Past Medical History: SVT, NSTEMI s/p PCI, COPD, hypertension, HLD, atrial fibrillation, DVT/PE s/p IVC filter placement (previously on Eliquis), squamous cell carcinoma of the skin, GERD, peripheral neuropathy, melanoma and bilateral hearing loss Remainder reviewed in history tab.   Pertinent Past Surgical History: IVC filter PCI of RCA 2016 Remainder reviewed in history tab.  Pertinent Social History: Tobacco use: Yes Alcohol use: No Lives at [facility]  Pertinent Family History: Cancer in mother Remainder reviewed in history tab.   Important Outpatient Medications: Called and confirmed with [facility]: Tylenol 1000 q8h prn Albuterol prn 2 puffs q6h prn Augmentin-875-125 prescribed for 7 days (today was supposed to be last day) Vitamin C 500 daily ASA 81 daily Atorvastatin 20 in evening Calcium 500 daily Stool softener BID Ferrous sulfate 325 MWF Flomax 0.4 mg qhs (BP parameter) Furosemide 20 mg prn if gaining 3 pounds overnight or increased SOB or leg swelling Gabapentin 300 mg TID Latanoprost drops qhs Lisinopril 10 mg (hold for SBP <100) Lutein 10 mg daily Miralax prn Mucinex 600 q12h Protonix every morning 40 mg  Refresh tears 4 times  Relax-M qhs Acidophilus Senna prn Symbreza eye drops Sodium chloride 1 gram TID (hyponatremia since 1 year ago) Theratears Trazadone 25 mg at bedtime Terdoza 400 mcg powder inhaler BID Vitamin B Complex 1 daily Citamin D 23 25  mcg daily [place]-fluticasone-BID Remainder reviewed in medication history.   Objective: BP (!) 165/59   Pulse 91   Temp 98.5 F (36.9 C) (Oral)   Resp 19   Ht '5\' 10"'$  (1.778 m)   Wt 79.4 kg   SpO2 100%   BMI 25.12 kg/m  Exam: General: Alert, oriented to self, year, and place. Did not know situation Eyes: Pupils equal ENTM: Mildly dry mucous membranes, nasal congestion Neck: Supple, ROM intact Cardiovascular: Regular rate, no M/R/G Respiratory: Diffuse expiratory wheezes throughout, productive sounding cough, mildly tachypneic, speaking full sentences Gastrointestinal: Distended, nontender to palpation, soft MSK: No lower extremity edema, no erythema or exquisite tenderness of calves Neuro: No focal deficits Psych: Mood appropriate, does have some baseline dementia  Labs:  CBC BMET  Recent Labs  Lab 01/08/23 1453  WBC 20.3*  HGB 12.2*  HCT 34.8*  PLT 360   Recent Labs  Lab 01/08/23 1453  NA 132*  K 4.7  CL 101  CO2 22  BUN 27*  CREATININE 1.14  GLUCOSE 108*  CALCIUM 8.4*    Pertinent additional labs respiratory panel negative,BNP 144.8 EKG: My own interpretation (not copied from electronic read) sinus rhythm, prolonged PR   Imaging Studies Performed:  Imaging Study (ie. Chest x-ray) Impression from Radiologist: No acute cardiopulmonary disease.  My Interpretation: Hyperexpansion, no consolidation   Gerrit Heck, MD 01/08/2023, 6:41 PM PGY-2, [place] Intern pager: [phone], text pages welcome Secure chat group [facility]  Hospital Teaching Service

## 2023-01-08 NOTE — ED Notes (Addendum)
ED TO INPATIENT HANDOFF REPORT  ED Nurse Name and Phone #: Vikki Ports RN [phone]   S Name/Age/Gender Marcus Martin 89 y.o. male Room/Bed: 005C/005C  Code Status   Code Status: DNR  Home/SNF/Other Skilled nursing facility Patient oriented to: self, place, time, and situation Is this baseline? Yes   Triage Complete: Triage complete  Chief Complaint COPD exacerbation ([place]) [J44.1]  Triage Note Pt BIB EMS for shortness of breath, had respiratory symptoms for about a week. Went to PCP and was neg for all respiratory tests. Received 1 albuterol treatment with EMS. Lungs sound junky. Shuffles at baseline. Aox4. From [facility]   Allergies Allergies  Allergen Reactions   Ticagrelor Other (See Comments)    "GI Hemorrhage"   Tetanus Toxoids Other (See Comments)    "Horse serum only"    Coffea Arabica     Other reaction(s): Other (See Comments) DECAF coffee causes vision changes per pt. DECAF coffee causes vision changes per pt.    Niacin     Other reaction(s): Flushing (disorder), Other (See Comments) [facility] but patient can not remember exact reaction Per [facility] records but patient can not remember exact reaction    Niacin And Related Other (See Comments)    Flushing   Niacin Er Other (See Comments)    Flushing   Simvastatin Other (See Comments)    "Muscle pain" Other reaction(s): Muscle pain Other reaction(s): Muscle pain (finding), Muscle pain (finding), Muscle pain (finding), Muscle pain (finding), Other (See Comments)   Tiotropium     Other reaction(s): Retention of urine    Level of Care/Admitting Diagnosis ED Disposition     ED Disposition  Admit   Condition  --   [place]: [place] [[id number]]  Level of Care: Med-Surg [16]  May place patient in observation at [facility] or [place] if equivalent level of care is available:: No  Covid Evaluation: Confirmed COVID Negative  Diagnosis: COPD exacerbation [facility])  [[id number]]  Admitting Physician: Gerrit Heck [[id number]]  Attending Physician: Dickie La [[id number]]          B Medical/Surgery History Past Medical History:  Diagnosis Date   Bilateral hearing loss    COPD (chronic obstructive pulmonary disease) ([place])    DVT (deep venous thrombosis) (HCC)    GERD (gastroesophageal reflux disease)    History of pulmonary embolus (PE)    HLD (hyperlipidemia)    HTN (hypertension)    Melanoma (HCC)    NSTEMI (non-ST elevated myocardial infarction) ([place])    OSA (obstructive sleep apnea)    Peripheral neuropathy    Squamous cell skin cancer    Past Surgical History:  Procedure Laterality Date   BIOPSY  11/07/2021   Procedure: BIOPSY;  Surgeon: Doran Stabler, MD;  Location: [facility];  Service: Gastroenterology;;   ESOPHAGOGASTRODUODENOSCOPY (EGD) WITH PROPOFOL N/A 11/07/2021   Procedure: ESOPHAGOGASTRODUODENOSCOPY (EGD) WITH PROPOFOL;  Surgeon: Doran Stabler, MD;  Location: [place];  Service: Gastroenterology;  Laterality: N/A;   PERCUTANEOUS CORONARY STENT INTERVENTION (PCI-S)     RCA     A IV Location/Drains/Wounds Patient Lines/Drains/Airways Status     Active Line/Drains/Airways     Name Placement date Placement time Site Days   Peripheral IV 01/08/23 20 G Anterior;Distal;Right;Upper Arm 01/08/23  1524  Arm  less than 1            Intake/Output Last 24 hours  Intake/Output Summary (Last 24 hours) at 01/08/2023 1759 Last data  filed at 01/08/2023 1523 Gross per 24 hour  Intake --  Output 300 ml  Net -300 ml    Labs/Imaging Results for orders placed or performed during the hospital encounter of 01/08/23 (from the past 48 hour(s))  Resp panel by RT-PCR (RSV, Flu A&B, Covid) Anterior Nasal Swab     Status: None   Collection Time: 01/08/23 12:48 PM   Specimen: Anterior Nasal Swab  Result Value Ref Range   SARS Coronavirus 2 by RT PCR NEGATIVE NEGATIVE   Influenza A by PCR NEGATIVE NEGATIVE   Influenza B by PCR  NEGATIVE NEGATIVE    Comment: (NOTE) The Xpert Xpress SARS-CoV-2/FLU/RSV plus assay is intended as an aid in the diagnosis of influenza from Nasopharyngeal swab specimens and should not be used as a sole basis for treatment. Nasal washings and aspirates are unacceptable for Xpert Xpress SARS-CoV-2/FLU/RSV testing.  Fact Sheet for Patients: EntrepreneurPulse.com.au  Fact Sheet for Healthcare Providers: IncredibleEmployment.be  This test is not yet approved or cleared by the [place] FDA and has been authorized for detection and/or diagnosis of SARS-CoV-2 by FDA under an Emergency Use Authorization (EUA). This EUA will remain in effect (meaning this test can be used) for the duration of the COVID-19 declaration under Section 564(b)(1) of the Act, 21 U.S.C. section 360bbb-3(b)(1), unless the authorization is terminated or revoked.     Resp Syncytial Virus by PCR NEGATIVE NEGATIVE    Comment: (NOTE) Fact Sheet for Patients: EntrepreneurPulse.com.au  Fact Sheet for Healthcare Providers: IncredibleEmployment.be  This test is not yet approved or cleared by the [place] FDA and has been authorized for detection and/or diagnosis of SARS-CoV-2 by FDA under an Emergency Use Authorization (EUA). This EUA will remain in effect (meaning this test can be used) for the duration of the COVID-19 declaration under Section 564(b)(1) of the Act, 21 U.S.C. section 360bbb-3(b)(1), unless the authorization is terminated or revoked.  Performed at [facility], [place] [address] [place], [place] [address]   Brain natriuretic peptide     Status: Abnormal   Collection Time: 01/08/23  1:30 PM  Result Value Ref Range   B Natriuretic Peptide 144.8 (H) 0.0 - 100.0 pg/mL    Comment: Performed at [facility] [address], [place], [place] [address]  Basic metabolic panel     Status: Abnormal   Collection Time:  01/08/23  2:53 PM  Result Value Ref Range   Sodium 132 (L) 135 - 145 mmol/L   Potassium 4.7 3.5 - 5.1 mmol/L   Chloride 101 98 - 111 mmol/L   CO2 22 22 - 32 mmol/L   Glucose, Bld 108 (H) 70 - 99 mg/dL    Comment: Glucose reference range applies only to samples taken after fasting for at least 8 hours.   BUN 27 (H) 8 - 23 mg/dL   Creatinine, Ser 1.14 0.61 - 1.24 mg/dL   Calcium 8.4 (L) 8.9 - 10.3 mg/dL   GFR, Estimated >60 >60 mL/min    Comment: (NOTE) Calculated using the CKD-EPI Creatinine Equation (2021)    Anion gap 9 5 - 15    Comment: Performed at [facility] [address], [place], [place] [address]  CBC with Differential/Platelet     Status: Abnormal   Collection Time: 01/08/23  2:53 PM  Result Value Ref Range   WBC 20.3 (H) 4.0 - 10.5 K/uL   RBC 3.84 (L) 4.22 - 5.81 MIL/uL   Hemoglobin 12.2 (L) 13.0 - 17.0  g/dL   HCT 34.8 (L) 39.0 - 52.0 %   MCV 90.6 80.0 - 100.0 fL   MCH 31.8 26.0 - 34.0 pg   MCHC 35.1 30.0 - 36.0 g/dL   RDW 13.2 11.5 - 15.5 %   Platelets 360 150 - 400 K/uL   nRBC 0.0 0.0 - 0.2 %   Neutrophils Relative % 83 %   Neutro Abs 16.6 (H) 1.7 - 7.7 K/uL   Lymphocytes Relative 10 %   Lymphs Abs 2.1 0.7 - 4.0 K/uL   Monocytes Relative 6 %   Monocytes Absolute 1.3 (H) 0.1 - 1.0 K/uL   Eosinophils Relative 0 %   Eosinophils Absolute 0.1 0.0 - 0.5 K/uL   Basophils Relative 0 %   Basophils Absolute 0.1 0.0 - 0.1 K/uL   Immature Granulocytes 1 %   Abs Immature Granulocytes 0.26 (H) 0.00 - 0.07 K/uL    Comment: Performed at [facility] [address], [place], [place] [address]   DG Chest Port 1 View  Result Date: 01/08/2023 CLINICAL DATA:  Short of breath. Respiratory complaints for approximately 1 week. EXAM: PORTABLE CHEST 1 VIEW COMPARISON:  11/16/2021. FINDINGS: Cardiac silhouette is normal in size. No mediastinal or hilar masses. Lungs are hyperexpanded. Mild prominence of the interstitial markings. No lung consolidation or evidence of  edema. No convincing pleural effusion or pneumothorax. Skeletal structures are grossly intact. IMPRESSION: No acute cardiopulmonary disease. Electronically Signed   By: Lajean Manes M.D.   On: 01/08/2023 13:39    Pending Labs Unresulted Labs (From admission, onward)     Start     Ordered   01/09/23 7353  Basic metabolic panel  Tomorrow morning,   R        01/08/23 1741   01/09/23 0500  CBC  Tomorrow morning,   R        01/08/23 1741            Vitals/Pain Today's Vitals   01/08/23 1149 01/08/23 1150 01/08/23 1500 01/08/23 1525  BP: (!) 165/62  (!) 165/59   Pulse: 99  91   Resp: 20  19   Temp:    98.5 F (36.9 C)  TempSrc:    Oral  SpO2: 100%  100%   Weight:  79.4 kg    Height:  '5\' 10"'$  (1.778 m)    PainSc:  3       Isolation Precautions No active isolations  Medications Medications  lactated ringers infusion (has no administration in time range)  azithromycin (ZITHROMAX) 500 mg in sodium chloride 0.9 % 250 mL IVPB (has no administration in time range)  ceFEPIme (MAXIPIME) 2 g in sodium chloride 0.9 % 100 mL IVPB (has no administration in time range)  enoxaparin (LOVENOX) injection 40 mg (has no administration in time range)  albuterol (PROVENTIL) (2.5 MG/3ML) 0.083% nebulizer solution 2.5 mg (2.5 mg Nebulization Given 01/08/23 1437)  ceFEPIme (MAXIPIME) 2 g in sodium chloride 0.9 % 100 mL IVPB (2 g Intravenous New Bag/Given 01/08/23 1718)  methylPREDNISolone sodium succinate (SOLU-MEDROL) 125 mg/2 mL injection 125 mg (125 mg Intravenous Given 01/08/23 1716)    Mobility walks with person assist and device     Focused Assessments Pulmonary Assessment Handoff:  Lung sounds: L Breath Sounds: Expiratory wheezes, Rhonchi R Breath Sounds: Expiratory wheezes, Rhonchi O2 Device: 2L [place]      R Recommendations: See Admitting Provider Note  Report given to:   Additional Notes:

## 2023-01-08 NOTE — Assessment & Plan Note (Addendum)
Resolving. Weaned to RA this morning by primary provider, with sats >94%. Will ambulate with pulse ox, prior to discharge to asssess O2 requirement. Will follow-up with pulm OP. - Continue prednisone course 40 mg daily for 5 days (2/6 - 2/10) - Completed Unasyn IV (2/6-2/8)  and IV azithromycin (2/5 - 2/8) - Augmentin (2/8-2/14) - O2 saturation goal 88-92% - [place], Incruse  - Incentive spirometry - DuoNeb every 6 hours as needed - Wean oxygen as tolerated

## 2023-01-08 NOTE — Progress Notes (Signed)
Pharmacy Antibiotic Note  Marcus Martin is a 89 y.o. male admitted on 01/08/2023 with pneumonia.  Pharmacy has been consulted for Cefepime dosing x 5 days. Pt resides in a NH.  Plan: Cefepime 2gm IV q12h Will f/u micro data, renal function, and pt's clinical condition  Height: '5\' 10"'$  (177.8 cm) Weight: 79.4 kg (175 lb 0.7 oz) IBW/kg (Calculated) : 73  Temp (24hrs), Avg:98.5 F (36.9 C), Min:98.5 F (36.9 C), Max:98.5 F (36.9 C)  Recent Labs  Lab 01/08/23 1453  WBC 20.3*  CREATININE 1.14    Estimated Creatinine Clearance: 47.1 mL/min (by C-G formula based on SCr of 1.14 mg/dL).    Allergies  Allergen Reactions   Ticagrelor Other (See Comments)    "GI Hemorrhage"   Tetanus Toxoids Other (See Comments)    "Horse serum only"    Coffea Arabica     Other reaction(s): Other (See Comments) DECAF coffee causes vision changes per pt. DECAF coffee causes vision changes per pt.    Niacin     Other reaction(s): Flushing (disorder), Other (See Comments) [facility] but patient can not remember exact reaction Per [facility] records but patient can not remember exact reaction    Niacin And Related Other (See Comments)    Flushing   Niacin Er Other (See Comments)    Flushing   Simvastatin Other (See Comments)    "Muscle pain" Other reaction(s): Muscle pain Other reaction(s): Muscle pain (finding), Muscle pain (finding), Muscle pain (finding), Muscle pain (finding), Other (See Comments)   Tiotropium     Other reaction(s): Retention of urine    Antimicrobials this admission: 2/5 Cefepime >> 2/10  Thank you for allowing pharmacy to be a part of this patient's care.  Sherlon Handing, PharmD, BCPS Please see amion for complete clinical pharmacist phone list 01/08/2023 5:16 PM

## 2023-01-08 NOTE — Progress Notes (Addendum)
FMTS Brief Progress Note  S: Patient says he is hard of hearing in both ears and prefers we speak to him more closely. He says he just had a bowel movement and would like to be changed. He also continues to endorse shortness of breath and says he would like to get a breathing treatment. He states the oxygen is not very helpful. I informed patient that his breathing treatments are set up in a way that he has to ask for them rather than scheduled to which he expressed understanding. We also informed patient of the most recent CT finding that suggested he may have a pneumonia vs mass. Patient was surprised by this but understood. He was very grateful and thanks [place] several times.  O: BP (!) 161/73 (BP Location: Left Arm)   Pulse 96   Temp 98.8 F (37.1 C) (Oral)   Resp (!) 22   Ht '5\' 11"'$  (1.803 m)   Wt 155 lb 13.8 oz (70.7 kg)   SpO2 98%   BMI 21.74 kg/m   General: in no acute distress and chronically ill-appearing HEENT: normocephalic and atraumatic Respiratory: on 3 L nasal cannula and mildly labored breathing, tachypneic. On auscultation there was notable rhonchi throughout right lobe with diminished breath sounds throughout L. There was also notable upper airway wheezing. Extremities: moving all extremities spontaneously Gastrointestinal: non-tender Cardiovascular: regular rate  A/P: - Orders reviewed. Labs for AM ordered, which was adjusted as needed.   Camelia Phenes, MD 01/08/2023, 10:20 PM PGY-1, [place] Night Resident  Please page [phone] with questions.

## 2023-01-08 NOTE — ED Triage Notes (Signed)
Pt BIB EMS for shortness of breath, had respiratory symptoms for about a week. Went to PCP and was neg for all respiratory tests. Received 1 albuterol treatment with EMS. Lungs sound junky. Shuffles at baseline. Aox4. From [facility]

## 2023-01-09 ENCOUNTER — Telehealth: Payer: Self-pay | Admitting: Emergency Medicine

## 2023-01-09 ENCOUNTER — Encounter (HOSPITAL_COMMUNITY): Payer: Self-pay | Admitting: Family Medicine

## 2023-01-09 DIAGNOSIS — J851 Abscess of lung with pneumonia: Secondary | ICD-10-CM | POA: Diagnosis present

## 2023-01-09 DIAGNOSIS — I2510 Atherosclerotic heart disease of native coronary artery without angina pectoris: Secondary | ICD-10-CM | POA: Diagnosis present

## 2023-01-09 DIAGNOSIS — Z79899 Other long term (current) drug therapy: Secondary | ICD-10-CM | POA: Diagnosis not present

## 2023-01-09 DIAGNOSIS — Z87891 Personal history of nicotine dependence: Secondary | ICD-10-CM | POA: Diagnosis not present

## 2023-01-09 DIAGNOSIS — J189 Pneumonia, unspecified organism: Secondary | ICD-10-CM | POA: Diagnosis not present

## 2023-01-09 DIAGNOSIS — K219 Gastro-esophageal reflux disease without esophagitis: Secondary | ICD-10-CM | POA: Diagnosis present

## 2023-01-09 DIAGNOSIS — E46 Unspecified protein-calorie malnutrition: Secondary | ICD-10-CM | POA: Diagnosis present

## 2023-01-09 DIAGNOSIS — Z86711 Personal history of pulmonary embolism: Secondary | ICD-10-CM | POA: Diagnosis not present

## 2023-01-09 DIAGNOSIS — J9601 Acute respiratory failure with hypoxia: Secondary | ICD-10-CM

## 2023-01-09 DIAGNOSIS — Z95828 Presence of other vascular implants and grafts: Secondary | ICD-10-CM | POA: Diagnosis not present

## 2023-01-09 DIAGNOSIS — I252 Old myocardial infarction: Secondary | ICD-10-CM | POA: Diagnosis not present

## 2023-01-09 DIAGNOSIS — J441 Chronic obstructive pulmonary disease with (acute) exacerbation: Secondary | ICD-10-CM | POA: Diagnosis present

## 2023-01-09 DIAGNOSIS — I4891 Unspecified atrial fibrillation: Secondary | ICD-10-CM | POA: Diagnosis present

## 2023-01-09 DIAGNOSIS — Z66 Do not resuscitate: Secondary | ICD-10-CM | POA: Diagnosis present

## 2023-01-09 DIAGNOSIS — G4733 Obstructive sleep apnea (adult) (pediatric): Secondary | ICD-10-CM | POA: Diagnosis present

## 2023-01-09 DIAGNOSIS — E222 Syndrome of inappropriate secretion of antidiuretic hormone: Secondary | ICD-10-CM | POA: Diagnosis present

## 2023-01-09 DIAGNOSIS — Z86718 Personal history of other venous thrombosis and embolism: Secondary | ICD-10-CM | POA: Diagnosis not present

## 2023-01-09 DIAGNOSIS — I471 Supraventricular tachycardia, unspecified: Secondary | ICD-10-CM | POA: Diagnosis present

## 2023-01-09 DIAGNOSIS — Z955 Presence of coronary angioplasty implant and graft: Secondary | ICD-10-CM | POA: Diagnosis not present

## 2023-01-09 DIAGNOSIS — Z85820 Personal history of malignant melanoma of skin: Secondary | ICD-10-CM | POA: Diagnosis not present

## 2023-01-09 DIAGNOSIS — E785 Hyperlipidemia, unspecified: Secondary | ICD-10-CM | POA: Diagnosis present

## 2023-01-09 DIAGNOSIS — Z9981 Dependence on supplemental oxygen: Secondary | ICD-10-CM | POA: Diagnosis not present

## 2023-01-09 DIAGNOSIS — J440 Chronic obstructive pulmonary disease with acute lower respiratory infection: Secondary | ICD-10-CM | POA: Diagnosis present

## 2023-01-09 DIAGNOSIS — I10 Essential (primary) hypertension: Secondary | ICD-10-CM | POA: Diagnosis present

## 2023-01-09 DIAGNOSIS — Z1152 Encounter for screening for COVID-19: Secondary | ICD-10-CM | POA: Diagnosis not present

## 2023-01-09 LAB — BASIC METABOLIC PANEL
Anion gap: 8 (ref 5–15)
BUN: 26 mg/dL — ABNORMAL HIGH (ref 8–23)
CO2: 19 mmol/L — ABNORMAL LOW (ref 22–32)
Calcium: 8.1 mg/dL — ABNORMAL LOW (ref 8.9–10.3)
Chloride: 104 mmol/L (ref 98–111)
Creatinine, Ser: 0.95 mg/dL (ref 0.61–1.24)
GFR, Estimated: >60 mL/min (ref >60)
Glucose, Bld: 148 mg/dL — ABNORMAL HIGH (ref 70–99)
Potassium: 4.1 mmol/L (ref 3.5–5.1)
Sodium: 131 mmol/L — ABNORMAL LOW (ref 135–145)

## 2023-01-09 LAB — CBC
HCT: 31.3 % — ABNORMAL LOW (ref 39.0–52.0)
Hemoglobin: 11.4 g/dL — ABNORMAL LOW (ref 13.0–17.0)
MCH: 32.2 pg (ref 26.0–34.0)
MCHC: 36.4 g/dL — ABNORMAL HIGH (ref 30.0–36.0)
MCV: 88.4 fL (ref 80.0–100.0)
Platelets: 323 10*3/uL (ref 150–400)
RBC: 3.54 MIL/uL — ABNORMAL LOW (ref 4.22–5.81)
RDW: 12.9 % (ref 11.5–15.5)
WBC: 14.8 10*3/uL — ABNORMAL HIGH (ref 4.0–10.5)
nRBC: 0 % (ref 0.0–0.2)

## 2023-01-09 LAB — EXPECTORATED SPUTUM ASSESSMENT W GRAM STAIN, RFLX TO RESP C: Special Requests: NORMAL

## 2023-01-09 LAB — MAGNESIUM: Magnesium: 2.1 mg/dL (ref 1.7–2.4)

## 2023-01-09 MED ORDER — SODIUM CHLORIDE 0.9 % IV SOLN
3.0000 g | Freq: Three times a day (TID) | INTRAVENOUS | Status: DC
  Administered 2023-01-09 – 2023-01-11 (×6): 3 g via INTRAVENOUS
  Filled 2023-01-09 (×6): qty 8

## 2023-01-09 MED ORDER — ORAL CARE MOUTH RINSE: 15.0000 mL | OROMUCOSAL | Status: DC | PRN

## 2023-01-09 MED ORDER — SODIUM CHLORIDE 0.9 % IV SOLN: 500.0000 mg | Freq: Every day | INTRAVENOUS | Status: DC

## 2023-01-09 MED ORDER — AZITHROMYCIN 500 MG PO TABS
500.0000 mg | ORAL_TABLET | Freq: Every day | ORAL | Status: DC
  Filled 2023-01-09: qty 1

## 2023-01-09 MED ORDER — SODIUM CHLORIDE 0.9 % IV SOLN
500.0000 mg | INTRAVENOUS | Status: AC
  Administered 2023-01-09 – 2023-01-10 (×2): 500 mg via INTRAVENOUS
  Filled 2023-01-09 (×2): qty 5

## 2023-01-09 NOTE — Telephone Encounter (Signed)
This patient needs a hospital follow up appointment in about 1 month, any provider. Thank you for arranging.

## 2023-01-09 NOTE — Progress Notes (Signed)
Daily Progress Note Intern Pager: [phone]  Patient name: Marcus Martin Medical record number: [id number] Date of birth: 10-Dec-1932 Age: 89 y.o. Gender: male  Primary Care Provider: Corrington, Delsa Grana, MD Consultants: Pulmonology Code Status: DNR  Pt Overview and Major Events to Date:  2/5: Admitted  Assessment and Plan: Marcus Martin is a 89 y.o. male PMH SVT, NSTEMI s/p PCI, COPD, hypertension, HLD, atrial fibrillation, DVT/PE s/p IVC filter placement (previously on Eliquis), chronic hyponatremia, squamous cell carcinoma of the skin, GERD, peripheral neuropathy, melanoma and bilateral hearing loss presenting with shortness of breath and cough for 1 week. Differential and plan below.  * Acute hypoxic respiratory failure (HCC) Currently on 3L low flow (baseline is no O2). 3.7 x 3.6 cm irregular thick walled cavitary mass in the medial right upper lobe with surrounding ground-glass consolidation-infection versus mass.  Initiated on cefepime and azithromycin for pneumonia coverage.  Pulmonology was consulted, who saw patient and recommend continue antibiotics with possible repeat CT in the future-we greatly appreciate their input.  Reasonably, prefer not to perform bronchoscopy on 89 year old with DNR-will need to have conversation with patient and family.  Reassuringly no PE on CTA.  Additionally, with COPD history and diffuse wheezing we will continue to treat with steroid and breathing treatments. - Pulmonology consulted, greatly appreciate recs - Continue prednisone course 40 mg daily for 5 days (2/6 - 2/10) - Continue cefepime (2/5-) and IV azithromycin (2/5 - 2/8) - Incentive spirometry - DuoNeb every 6 hours as needed - Wean oxygen as tolerated   Chronic conditions SVT NSTEMI s/p PCI-ASA '81mg'$  HTN-Lisinopril 10 mg daily HLD-atorvastatin 20 mg Atrial fibrillation- Unclear if taking Eliquis. DVT/PE s/p IVC filter placement  GERD-protonix  Peripheral neuropathy-gabapentin  300 BID Dry eyes-eye drops reorderd  FEN/GI: Regular diet PPx: Lovenox Dispo: Pending clinical improvement  Subjective:  Spoke with patient at bedside.  He understands that he has an infection in his lungs, I remember speaking to pulmonology this morning.  He is in agreement with continuing antibiotics as well as breathing treatments and steroids for COPD.  He has no other concerns or questions at this time.  Objective: Temp:  [97.6 F (36.4 C)-99 F (37.2 C)] 97.6 F (36.4 C) (02/06 0735) Pulse Rate:  [72-99] 74 (02/06 0746) Resp:  [18-22] 22 (02/06 0746) BP: (125-172)/(58-73) 153/67 (02/06 0735) SpO2:  [95 %-100 %] 97 % (02/06 0746) Weight:  [70.7 kg-79.4 kg] 70.7 kg (02/05 1905) Physical Exam: General: NAD, chronically ill-appearing Cardiovascular: RRR, no murmurs Respiratory: Diffuse expiratory wheezes throughout, productive sounding cough, speaking in full sentences, breathing comfortably on 3L nasal cannula  Laboratory: Most recent CBC Lab Results  Component Value Date   WBC 14.8 (H) 01/09/2023   HGB 11.4 (L) 01/09/2023   HCT 31.3 (L) 01/09/2023   MCV 88.4 01/09/2023   PLT 323 01/09/2023   Most recent BMP    Latest Ref Rng & Units 01/09/2023    6:29 AM  BMP  Glucose 70 - 99 mg/dL 148   BUN 8 - 23 mg/dL 26   Creatinine 0.61 - 1.24 mg/dL 0.95   Sodium 135 - 145 mmol/L 131   Potassium 3.5 - 5.1 mmol/L 4.1   Chloride 98 - 111 mmol/L 104   CO2 22 - 32 mmol/L 19   Calcium 8.9 - 10.3 mg/dL 8.1     Imaging/Diagnostic Tests:  CT Angio Chest Pulmonary Embolism (PE) W or WO Contrast Result Date: 01/08/2023 IMPRESSION: 3.7 x 3.6 cm irregular thick walled  cavitary mass in the medial right upper lobe with surrounding ground-glass consolidation. Differential considerations would include cavitary pneumonia, atypical infection, or malignancy. Coronary artery disease. Aortic Atherosclerosis (ICD10-I70.0).  DG Chest Port 1 View Result Date: 01/08/2023 IMPRESSION: No acute  cardiopulmonary disease.   Leslie Dales, DO 01/09/2023, 8:59 AM  PGY-1, [place] Intern pager: [phone], text pages welcome Secure chat group [place]

## 2023-01-09 NOTE — Consult Note (Signed)
Marcus Martin, MRN:  [id number], DOB:  Jun 14, 1933, LOS: 0 ADMISSION DATE:  01/08/2023, CONSULTATION DATE: 01/09/2023 REFERRING MD: Teaching service, CHIEF COMPLAINT: Productive cough, shortness of breath  History of Present Illness:  89 year old male who is a DNR presents with 1 week of increasing shortness of breath cough with purulent gray sputum who underwent CT scan which shows right upper lobe 3 x 6 size 3 x 7 cm cavitary lesion.  He has a remote history of smoking quit when he is in his 52s.  He does have a history of melanoma.  Pulmonary critical care asked to evaluate for possible bronchoscopy.  Will treat as infection with continued monitoring of follow-up in future.  Will try to avoid invasive procedures at 89 year old DNR if able and will need to discuss with him and his family elected.  He would want a pursue diagnosis.  No further treatment is antimicrobial therapy and follow-up CT scan of the chest would be all that is needed.  He will need a follow-up CT in the near future.  Pertinent  Medical History   Past Medical History:  Diagnosis Date   Bilateral hearing loss    COPD (chronic obstructive pulmonary disease) (HCC)    DVT (deep venous thrombosis) (HCC)    GERD (gastroesophageal reflux disease)    History of pulmonary embolus (PE)    HLD (hyperlipidemia)    HTN (hypertension)    Melanoma (HCC)    NSTEMI (non-ST elevated myocardial infarction) (HCC)    OSA (obstructive sleep apnea)    Peripheral neuropathy    Squamous cell skin cancer      Significant Hospital Events: Including procedures, antibiotic start and stop dates in addition to other pertinent events     Interim History / Subjective:  Productive cough  Objective   Blood pressure (!) 153/67, pulse 74, temperature 97.6 F (36.4 C), temperature source Oral, resp. rate (!) 22, height '5\' 11"'$  (1.803 m), weight 70.7 kg, SpO2 97 %.        Intake/Output Summary (Last 24 hours) at 01/09/2023 0809 Last data  filed at 01/09/2023 0448 Gross per 24 hour  Intake 1227.73 ml  Output 1000 ml  Net 227.73 ml   Filed Weights   01/08/23 1150 01/08/23 1905  Weight: 79.4 kg 70.7 kg    Examination: General: 89 year old male awake alert no acute distress HENT: No JVD is appreciated Lungs: Rattling cough has been heard without stethoscope Cardiovascular: Heart sounds are distant Abdomen: Abdomen soft nontender positive bowel sounds Extremities: Without edema warm Neuro: Appears grossly intact hard of hearing is a block to neurological evaluation GU: Voids  Resolved Hospital Problem list     Assessment & Plan:  New finding of right upper lobe 3.6 x 3.7 cm cavitary lesion in the setting of increasing shortness of breath x 1 week with rattling cough and purulent sputum in a patient who is a DNR. Agree with treating as infection if lesion clears she will need to be followed up with CT scan. Agree with bronchodilators and steroids Agree with pulmonary toilet Will need to be evaluated for possible bronchoscopy for diagnostic evaluation versus monitoring in the future if he does not respond to antimicrobial therapy.     Per primary Hyponatremia Gastroesophageal reflux disease SIADH Malnutrition Hearing loss History of melanoma Hypertension  Best Practice (right click and "Reselect all SmartList Selections" daily)   Diet/type: Regular consistency (see orders) DVT prophylaxis: LMWH GI prophylaxis: PPI Lines: N/A Foley:  N/A Code Status:  DNR Last date of multidisciplinary goals of care discussion [tbd]  Labs   CBC: Recent Labs  Lab 01/08/23 1453 01/09/23 0629  WBC 20.3* 14.8*  NEUTROABS 16.6*  --   HGB 12.2* 11.4*  HCT 34.8* 31.3*  MCV 90.6 88.4  PLT 360 185    Basic Metabolic Panel: Recent Labs  Lab 01/08/23 1453 01/09/23 0629  NA 132* 131*  K 4.7 4.1  CL 101 104  CO2 22 19*  GLUCOSE 108* 148*  BUN 27* 26*  CREATININE 1.14 0.95  CALCIUM 8.4* 8.1*  MG  --  2.1    GFR: Estimated Creatinine Clearance: 54.8 mL/min (by C-G formula based on SCr of 0.95 mg/dL). Recent Labs  Lab 01/08/23 1453 01/09/23 0629  WBC 20.3* 14.8*    Liver Function Tests: No results for input(s): "AST", "ALT", "ALKPHOS", "BILITOT", "PROT", "ALBUMIN" in the last 168 hours. No results for input(s): "LIPASE", "AMYLASE" in the last 168 hours. No results for input(s): "AMMONIA" in the last 168 hours.  ABG    Component Value Date/Time   PHART 7.407 10/31/2021 1840   PCO2ART 31.3 (L) 10/31/2021 1840   PO2ART 77.0 (L) 10/31/2021 1840   HCO3 19.3 (L) 10/31/2021 1840   TCO2 17 (L) 10/31/2021 1245   ACIDBASEDEF 4.6 (H) 10/31/2021 1840   O2SAT 95.5 10/31/2021 1840     Coagulation Profile: No results for input(s): "INR", "PROTIME" in the last 168 hours.  Cardiac Enzymes: No results for input(s): "CKTOTAL", "CKMB", "CKMBINDEX", "TROPONINI" in the last 168 hours.  HbA1C: No results found for: "HGBA1C"  CBG: No results for input(s): "GLUCAP" in the last 168 hours.  Review of Systems:   Very hoh Positive for rattling cough with purulent sputum noted to be gray Increasing shortness of breath x 1 week No complaints of lower extremity discomfort Note he quit smoking in his 35s  Past Medical History:  He,  has a past medical history of Bilateral hearing loss, COPD (chronic obstructive pulmonary disease) ([place]), DVT (deep venous thrombosis) ([place]), GERD (gastroesophageal reflux disease), History of pulmonary embolus (PE), HLD (hyperlipidemia), HTN (hypertension), Melanoma ([place]), NSTEMI (non-ST elevated myocardial infarction) ([place]), OSA (obstructive sleep apnea), Peripheral neuropathy, and Squamous cell skin cancer.   Surgical History:   Past Surgical History:  Procedure Laterality Date   BIOPSY  11/07/2021   Procedure: BIOPSY;  Surgeon: Doran Stabler, MD;  Location: [facility];  Service: Gastroenterology;;   ESOPHAGOGASTRODUODENOSCOPY (EGD) WITH PROPOFOL N/A  11/07/2021   Procedure: ESOPHAGOGASTRODUODENOSCOPY (EGD) WITH PROPOFOL;  Surgeon: Doran Stabler, MD;  Location: [facility];  Service: Gastroenterology;  Laterality: N/A;   PERCUTANEOUS CORONARY STENT INTERVENTION (PCI-S)     RCA     Social History:   reports that he quit smoking about 44 years ago. His smoking use included cigarettes. He does not have any smokeless tobacco history on file. He reports that he does not currently use alcohol. He reports that he does not use drugs.   Family History:  His family history includes Cancer in his mother.   Allergies Allergies  Allergen Reactions   Ticagrelor Other (See Comments)    "GI Hemorrhage"   Tetanus Toxoids Other (See Comments)    "Horse serum only"    Coffea Arabica     Other reaction(s): Other (See Comments) DECAF coffee causes vision changes per pt. DECAF coffee causes vision changes per pt.    Niacin     Other reaction(s): Flushing (disorder), Other (See Comments) [facility] but patient  can not remember exact reaction [facility] records but patient can not remember exact reaction    Niacin And Related Other (See Comments)    Flushing   Niacin Er Other (See Comments)    Flushing   Simvastatin Other (See Comments)    "Muscle pain" Other reaction(s): Muscle pain Other reaction(s): Muscle pain (finding), Muscle pain (finding), Muscle pain (finding), Muscle pain (finding), Other (See Comments)   Tiotropium     Other reaction(s): Retention of urine     Home Medications  Prior to Admission medications   Medication Sig Start Date End Date Taking? Authorizing Provider  acetaminophen (TYLENOL) 500 MG tablet Take 500 mg by mouth 3 (three) times daily as needed for mild pain, headache or fever.    [provider]  albuterol (VENTOLIN HFA) 108 (90 Base) MCG/ACT inhaler Inhale 2 puffs into the lungs every 6 (six) hours as needed for wheezing or shortness of breath.    [provider]  amLODipine (NORVASC) 5  MG tablet Take 1 tablet (5 mg total) by mouth daily. 11/09/21   Virl Axe, MD  apixaban (ELIQUIS) 5 MG TABS tablet Take 5 mg by mouth 2 (two) times daily.    [provider]  atorvastatin (LIPITOR) 20 MG tablet Take 20 mg by mouth every evening.    [provider]  B Complex Vitamins (B COMPLEX PO) Take 1 tablet by mouth daily.    [provider]  Baclofen 5 MG TABS Take 5 mg by mouth 2 (two) times daily as needed (muscle spasms). 11/09/21   [provider]  Brinzolamide-Brimonidine (SIMBRINZA) 1-0.2 % SUSP Place 1 drop into both eyes in the morning and at bedtime.    [provider]  Calcium Carb-Cholecalciferol (CALCIUM 500/VITAMIN D PO) Take 1 tablet by mouth daily.    [provider]  Cholecalciferol (VITAMIN D3) 25 MCG (1000 UT) CAPS Take 1,000 Units by mouth daily.    [provider]  Ensure (ENSURE) Take 237 mLs by mouth in the morning and at bedtime.    [provider]  ferrous sulfate 325 (65 FE) MG tablet Take 325 mg by mouth every Monday, Wednesday, and Friday.    [provider]  gabapentin (NEURONTIN) 300 MG capsule Take 2 capsules (600 mg total) by mouth at bedtime. Separate '400mg'$  dosage for daytime. 11/19/21   Virl Axe, MD  gabapentin (NEURONTIN) 400 MG capsule Take 1 capsule (400 mg total) by mouth every morning. Separate '300mg'$  dosage for bedtime. 11/19/21   Virl Axe, MD  latanoprost (XALATAN) 0.005 % ophthalmic solution Place 1 drop into both eyes at bedtime.    [provider]  lisinopril (ZESTRIL) 40 MG tablet Take 1 tablet (40 mg total) by mouth daily. 11/09/21   Virl Axe, MD  LUTEIN PO Take 1 capsule by mouth daily.    [provider]  Multiple Vitamin (MULTIVITAMIN) tablet Take 1 tablet by mouth daily.    [provider]  Neomycin-Bacitracin-Polymyxin (TRIPLE ANTIBIOTIC) OINT Place 1 application onto the skin 2 (two) times daily as needed ([place]  wounds/cuts).    [provider]  pantoprazole (PROTONIX) 40 MG tablet Take 1 tablet (40 mg total) by mouth 2 (two) times daily before a meal. 11/08/21   Virl Axe, MD  polyvinyl alcohol (LIQUIFILM TEARS) 1.4 % ophthalmic solution Place 1 drop into both eyes daily as needed for dry eyes.    [provider]  Probiotic Product (PROBIOTIC PO) Take 1 capsule by mouth daily.  [provider]  silver sulfADIAZINE (SILVADENE) 1 % cream Apply 1 application topically in the morning and at bedtime.    [provider]  sodium chloride 1 g tablet Take 1 tablet (1 g total) by mouth 3 (three) times daily with meals. 11/08/21   Virl Axe, MD  sucralfate (CARAFATE) 1 GM/10ML suspension Take 10 mLs (1 g total) by mouth 4 (four) times daily -  with meals and at bedtime. 11/08/21   Virl Axe, MD  tamsulosin (FLOMAX) 0.4 MG CAPS capsule Take 0.4 mg by mouth at bedtime. Hold for SBP <110    [provider]  triamcinolone (KENALOG) 0.025 % cream Apply 1 application topically 2 (two) times daily.    [provider]  vitamin C (ASCORBIC ACID) 500 MG tablet Take 500 mg by mouth daily.    [provider]     Critical care time: Ferol Luz Jacqui Headen ACNP Acute Care Nurse Practitioner [place] Please consult Amion 01/09/2023, 8:09 AM

## 2023-01-09 NOTE — Hospital Course (Addendum)
Marcus Martin is an 89 y.o. male who was admitted for acute hypoxemic respiratory failure. PMH significant for SVT, NSTEMI s/p PCI, COPD, HTN, HLD, a-fib, prior DVT/PE s/p IVC filter, and chronic hyponatremia.  Brief Hospital course as below.  Acute Hypoxemic Respiratory Failure Patient presented from assisted living facility with productive cough and worsening shortness of breath.  Found to have acute hypoxic respiratory failure.  Patient had significant diffuse bilateral expiratory wheezing with increased sputum production (in the setting of COPD).  He was started on cefepime and azithromycin.  Additionally chest CT was obtained which revealed a right upper lobe cavitary pneumonia, versus mass.  Pulmonology was consulted, we believe this cavitary lesion is likely infectious at this time.  Pulmonology recommended covering for anaerobes, and IV Unasyn was started.  Patient received 2 days of cefepime, 2 days of IV Unasyn, a full 3-day course of azithromycin and transition to p.o. Vantin on 01/11/2023.  QuantiFERON gold assay was obtained, to assess for TB, and still pending at time of discharge.  Additionally, patient was experiencing exacerbation of COPD-secondary to pneumonia.  He was started on triple therapy, receive DuoNebs as needed and started on prednisone. CPAP was continued for sleep. He required 2L [place], and then weaned to room air on 2/9.  Patient maintained oxygen saturation greater than 90% with ambulation at time of discharge.  Discharge home with 11 tablets of Augmentin (to complete 10-day course), 1 tablet of prednisone (to complete 5-day course), and triple therapy inhalers.  He will follow-up with pulmonology outpatient, who also recommend a CT chest and approximately 6 weeks to confirm resolution of pneumonia.  Other chronic medical conditions were treated medically and stable at time of discharge.  Follow-up recommendations Recommend patient continue triple therapy for COPD QuantiFERON  gold test pending at time of discharge.  If positive, recommend speaking with pulmonology and may need to call health department. Ensure outpatient pulm follow-up, needs repeat CT in 6 to 8 weeks around March 18 - April 1.

## 2023-01-09 NOTE — Consult Note (Signed)
[facility] Nurse Consult Note: Reason for Consult: skin tear R forearm  Wound type: partial and full thickness skin loss R forearm; traumatic   Measurement: 7 cms x 5 cms x 0.1 cm  Wound bed: pink moist, 1 cm area of yellow fibrinous tissue distal portion of wound  Drainage (amount, consistency, odor) minimal serous  Periwound: old scars from prior injuries per patient  Dressing procedure/placement/frequency: Clean area with NS, apply Xeroform gauze Kellie Simmering [phone]) daily and cover with foam or wrap with kerlix to secure.    POC discussed with patient and bedside nurse.  WOC will not follow this patient at this time, reconsult if needed.   Thank you,    Pristine Gladhill MSN, RN-BC, [employer]

## 2023-01-10 DIAGNOSIS — J9601 Acute respiratory failure with hypoxia: Secondary | ICD-10-CM | POA: Diagnosis not present

## 2023-01-10 LAB — BASIC METABOLIC PANEL
Anion gap: 8 (ref 5–15)
BUN: 30 mg/dL — ABNORMAL HIGH (ref 8–23)
CO2: 19 mmol/L — ABNORMAL LOW (ref 22–32)
Calcium: 8.3 mg/dL — ABNORMAL LOW (ref 8.9–10.3)
Chloride: 104 mmol/L (ref 98–111)
Creatinine, Ser: 1.11 mg/dL (ref 0.61–1.24)
GFR, Estimated: >60 mL/min (ref >60)
Glucose, Bld: 113 mg/dL — ABNORMAL HIGH (ref 70–99)
Potassium: 4.2 mmol/L (ref 3.5–5.1)
Sodium: 131 mmol/L — ABNORMAL LOW (ref 135–145)

## 2023-01-10 LAB — EXPECTORATED SPUTUM ASSESSMENT W GRAM STAIN, RFLX TO RESP C: Special Requests: NORMAL

## 2023-01-10 LAB — CBC
HCT: 33 % — ABNORMAL LOW (ref 39.0–52.0)
Hemoglobin: 11.8 g/dL — ABNORMAL LOW (ref 13.0–17.0)
MCH: 31.6 pg (ref 26.0–34.0)
MCHC: 35.8 g/dL (ref 30.0–36.0)
MCV: 88.5 fL (ref 80.0–100.0)
Platelets: 392 10*3/uL (ref 150–400)
RBC: 3.73 MIL/uL — ABNORMAL LOW (ref 4.22–5.81)
RDW: 13 % (ref 11.5–15.5)
WBC: 22.9 10*3/uL — ABNORMAL HIGH (ref 4.0–10.5)
nRBC: 0 % (ref 0.0–0.2)

## 2023-01-10 MED ORDER — UMECLIDINIUM BROMIDE 62.5 MCG/ACT IN AEPB
1.0000 | INHALATION_SPRAY | Freq: Every day | RESPIRATORY_TRACT | Status: DC
  Administered 2023-01-10 – 2023-01-12 (×3): 1 via RESPIRATORY_TRACT
  Filled 2023-01-10: qty 7

## 2023-01-10 MED ORDER — FLUTICASONE FUROATE-VILANTEROL 100-25 MCG/ACT IN AEPB
1.0000 | INHALATION_SPRAY | Freq: Every day | RESPIRATORY_TRACT | Status: DC
  Administered 2023-01-10 – 2023-01-12 (×3): 1 via RESPIRATORY_TRACT
  Filled 2023-01-10: qty 28

## 2023-01-10 NOTE — Progress Notes (Signed)
CSW spoke with Elyse Hsu at [facility] who states patient is from the ALF side of the facility. Elyse Hsu confirms patient can return once medically stable for discharge.  Madilyn Fireman, MSW, LCSW Transitions of Care  Clinical Social Worker II [phone]

## 2023-01-10 NOTE — Progress Notes (Addendum)
     Daily Progress Note Intern Pager: [phone]  Patient name: Marcus Martin Medical record number: [id number] Date of birth: 1933/04/24 Age: 89 y.o. Gender: male  Primary Care Provider: Corrington, Delsa Grana, MD Consultants: Pulmonology Code Status: DNR  Pt Overview and Major Events to Date:  2/5: Admitted  Assessment and Plan: Assessment and Plan: Arjen Schubert is a 89 y.o. male PMH SVT, NSTEMI s/p PCI, COPD, hypertension, HLD, atrial fibrillation, DVT/PE s/p IVC filter placement (previously on Eliquis), chronic hyponatremia, squamous cell carcinoma of the skin, GERD, peripheral neuropathy, melanoma and bilateral hearing loss presenting with shortness of breath and cough for 1 week. Differential and plan below.  * Acute hypoxic respiratory failure ([place]) Patient weaned to 2L low flow (baseline is no O2).  Pulmonology was consulted for treatment and diagnostic recommendations for irregular thick-walled cavity mass and medial right upper lobe.  Per pulmonology, recommend initiating anaerobic coverage.  Primary team switch cefepime to Unasyn IV.  Additionally, recommending repeat CT in approximately 6 weeks-we greatly appreciate their input.  If patient is not improving after treatment, they may pursue diagnostic options-however this will likely be outpatient setting.  Patient still has bilateral expiratory wheezing, despite prednisone. Per chart review, appears patient was on triple therapy- we will restart today.  Although patient is weaning, he denies significant symptomatic improvement, and does have elevated leukocytosis, will continue IV antibiotics at this time. - Pulmonology consulted, greatly appreciate recs - Continue prednisone course 40 mg daily for 5 days (2/6 - 2/10) - Continue Unasyn IV (12/6-)  and IV azithromycin (2/5 - 2/8) - Breo, Incruse  - Incentive spirometry - DuoNeb every 6 hours as needed - Wean oxygen as tolerated   FEN/GI: Regular PPx: Lovenox Dispo: Pending  clinical improvement  Subjective:  Patient does not believe he is breathing better, despite weaning.  Additionally, cough is his primary concern.  Objective: Temp:  [97.6 F (36.4 C)-97.9 F (36.6 C)] 97.9 F (36.6 C) (02/07 0755) Pulse Rate:  [73-90] 90 (02/07 0807) Resp:  [16-22] 22 (02/07 0807) BP: (126-172)/(54-62) 172/61 (02/07 0755) SpO2:  [91 %-99 %] 97 % (02/07 0807) FiO2 (%):  [28 %] 28 % (02/07 0807) Physical Exam: General: NAD, resting comfortably bed, chronically ill-appearing Cardiovascular: RRR, no murmurs Respiratory: Bilateral expiratory wheezing throughout, normal work of breathing on 2L [place] Extremities: Moves all 4 extremities, warm and dry  Laboratory: Most recent CBC Lab Results  Component Value Date   WBC 22.9 (H) 01/10/2023   HGB 11.8 (L) 01/10/2023   HCT 33.0 (L) 01/10/2023   MCV 88.5 01/10/2023   PLT 392 01/10/2023   Most recent BMP    Latest Ref Rng & Units 01/10/2023    5:18 AM  BMP  Glucose 70 - 99 mg/dL 113   BUN 8 - 23 mg/dL 30   Creatinine 0.61 - 1.24 mg/dL 1.11   Sodium 135 - 145 mmol/L 131   Potassium 3.5 - 5.1 mmol/L 4.2   Chloride 98 - 111 mmol/L 104   CO2 22 - 32 mmol/L 19   Calcium 8.9 - 10.3 mg/dL 8.3    Leslie Dales, DO 01/10/2023, 10:40 AM  PGY-1, [place] Intern pager: [phone], text pages welcome Secure chat group [place]

## 2023-01-11 DIAGNOSIS — J9601 Acute respiratory failure with hypoxia: Secondary | ICD-10-CM | POA: Diagnosis not present

## 2023-01-11 LAB — CBC
HCT: 32 % — ABNORMAL LOW (ref 39.0–52.0)
Hemoglobin: 11.4 g/dL — ABNORMAL LOW (ref 13.0–17.0)
MCH: 31.6 pg (ref 26.0–34.0)
MCHC: 35.6 g/dL (ref 30.0–36.0)
MCV: 88.6 fL (ref 80.0–100.0)
Platelets: 356 10*3/uL (ref 150–400)
RBC: 3.61 MIL/uL — ABNORMAL LOW (ref 4.22–5.81)
RDW: 12.8 % (ref 11.5–15.5)
WBC: 11.3 10*3/uL — ABNORMAL HIGH (ref 4.0–10.5)
nRBC: 0 % (ref 0.0–0.2)

## 2023-01-11 LAB — BASIC METABOLIC PANEL
Anion gap: 7 (ref 5–15)
BUN: 34 mg/dL — ABNORMAL HIGH (ref 8–23)
CO2: 23 mmol/L (ref 22–32)
Calcium: 8.1 mg/dL — ABNORMAL LOW (ref 8.9–10.3)
Chloride: 101 mmol/L (ref 98–111)
Creatinine, Ser: 0.98 mg/dL (ref 0.61–1.24)
GFR, Estimated: >60 mL/min (ref >60)
Glucose, Bld: 117 mg/dL — ABNORMAL HIGH (ref 70–99)
Potassium: 4 mmol/L (ref 3.5–5.1)
Sodium: 131 mmol/L — ABNORMAL LOW (ref 135–145)

## 2023-01-11 MED ORDER — GUAIFENESIN-DM 100-10 MG/5ML PO SYRP
5.0000 mL | ORAL_SOLUTION | ORAL | Status: DC | PRN
  Administered 2023-01-11 (×2): 5 mL via ORAL
  Filled 2023-01-11 (×2): qty 10

## 2023-01-11 MED ORDER — TRAZODONE HCL 50 MG PO TABS: 25.0000 mg | ORAL_TABLET | Freq: Every evening | ORAL | Status: DC | PRN

## 2023-01-11 MED ORDER — TRAZODONE HCL 50 MG PO TABS
25.0000 mg | ORAL_TABLET | Freq: Every evening | ORAL | Status: DC | PRN
  Administered 2023-01-11: 25 mg via ORAL
  Filled 2023-01-11 (×2): qty 1

## 2023-01-11 MED ORDER — TRAZODONE HCL 50 MG PO TABS
25.0000 mg | ORAL_TABLET | Freq: Once | ORAL | Status: AC | PRN
  Administered 2023-01-11: 25 mg via ORAL

## 2023-01-11 MED ORDER — AMOXICILLIN-POT CLAVULANATE 875-125 MG PO TABS
1.0000 | ORAL_TABLET | Freq: Two times a day (BID) | ORAL | Status: DC
  Administered 2023-01-11 – 2023-01-12 (×3): 1 via ORAL
  Filled 2023-01-11 (×3): qty 1

## 2023-01-11 MED ORDER — SODIUM CHLORIDE 1 G PO TABS
1.0000 g | ORAL_TABLET | Freq: Three times a day (TID) | ORAL | Status: DC
  Administered 2023-01-11 – 2023-01-12 (×4): 1 g via ORAL
  Filled 2023-01-11 (×4): qty 1

## 2023-01-11 MED ORDER — TRAZODONE HCL 50 MG PO TABS: 25.0000 mg | ORAL_TABLET | Freq: Once | ORAL | Status: DC | PRN

## 2023-01-11 NOTE — Progress Notes (Addendum)
FMTS Brief Progress Note  Paged by RN Milus Height due to patient having difficulty breathing even after given breathing treatment and cough syrup. When I checked in on patient from outside his room, he was sleeping upright comfortably satting 98% on 2L [place] with unlabored respirations. Decided not to wake patient up given he was already comfortable. Spoke to RN to page me again if he wakes up and continues to endorse SOB.  Update: Patient woke up and continued to endorse SOB. On evaluation, patient had audible upper airway secretions. Spoke to RT Reed Breech to provide patient with flutter valve to clear airway secretions. Patient also asked to be on a CPAP device for his OSA - CPAP was provided to patient.

## 2023-01-11 NOTE — Progress Notes (Addendum)
     Daily Progress Note Intern Pager: [phone]  Patient name: Marcus Martin Medical record number: [id number] Date of birth: 04/24/1933 Age: 89 y.o. Gender: male  Primary Care Provider: Corrington, Delsa Grana, MD Consultants: Pulmonology Code Status: DNR  Pt Overview and Major Events to Date:  2/5: Admitted  Assessment and Plan: Stoney Deharo is a 89 y.o. male PMH SVT, NSTEMI s/p PCI, COPD, hypertension, HLD, atrial fibrillation, DVT/PE s/p IVC filter placement (previously on Eliquis), chronic hyponatremia, squamous cell carcinoma of the skin, GERD, peripheral neuropathy, melanoma and bilateral hearing loss presenting with shortness of breath and cough for 1 week. Differential and plan below.   * Acute hypoxic respiratory failure (HCC) Overnight, patient stated that he had OSA (wears CPAP at night) and requested CPAP-which improved his SOB and helped him sleep.  Patient is symptomatically improving today.  Reassuringly, he has remained afebrile and leukocytosis is resolving.  We started him on triple therapy for COPD, which he was on prior to admission.  On exam, bilateral expiratory wheezing is significantly improved."  Last dose of azithromycin today.  We will complete steroid course.  Consider stepdown to oral antibiotics today in preparation of discharge planning (Augmentin).  As patient does not have O2 requirement baseline, preference would be weaning to room air prior to discharge-he has weaned to 1 L this morning. - Continue prednisone course 40 mg daily for 5 days (2/6 - 2/10) - Continue Unasyn IV (2/6-2/8)  and IV azithromycin (2/5 - 2/8) - Start Augmentin (2/8-2/14) - O2 saturation goal 88-92% - Breo, Incruse  - Incentive spirometry - DuoNeb every 6 hours as needed - Wean oxygen as tolerated   Hyponatremia Chronic. Restart home sodium tabs today.  FEN/GI: Regular diet PPx: Lovenox Dispo: Home pending clinical improvement  Subjective:  Patient reports that he feels much  better today, believes CPAP helped significantly.  Feels he is breathing better and fatigue is improving.  Objective: Temp:  [97.6 F (36.4 C)-98 F (36.7 C)] 97.8 F (36.6 C) (02/08 0726) Pulse Rate:  [72-93] 78 (02/08 0726) Resp:  [16-20] 17 (02/08 0546) BP: (124-171)/(57-108) 171/70 (02/08 0726) SpO2:  [97 %-99 %] 98 % (02/08 0749) FiO2 (%):  [28 %] 28 % (02/07 1257) Physical Exam: General: NAD, chronically ill-appearing Cardiovascular: RRR, no murmurs Respiratory: Improved bilateral expiratory wheezing.  Normal work of breathing on 1 L [place]  Laboratory: Most recent CBC Lab Results  Component Value Date   WBC 11.3 (H) 01/11/2023   HGB 11.4 (L) 01/11/2023   HCT 32.0 (L) 01/11/2023   MCV 88.6 01/11/2023   PLT 356 01/11/2023   Most recent BMP    Latest Ref Rng & Units 01/11/2023    3:37 AM  BMP  Glucose 70 - 99 mg/dL 117   BUN 8 - 23 mg/dL 34   Creatinine 0.61 - 1.24 mg/dL 0.98   Sodium 135 - 145 mmol/L 131   Potassium 3.5 - 5.1 mmol/L 4.0   Chloride 98 - 111 mmol/L 101   CO2 22 - 32 mmol/L 23   Calcium 8.9 - 10.3 mg/dL 8.1     Leslie Dales, DO 01/11/2023, 9:36 AM  PGY-1, [place] Intern pager: [phone], text pages welcome Secure chat group [place]

## 2023-01-12 ENCOUNTER — Other Ambulatory Visit (HOSPITAL_COMMUNITY): Payer: Self-pay

## 2023-01-12 DIAGNOSIS — J9601 Acute respiratory failure with hypoxia: Secondary | ICD-10-CM | POA: Diagnosis not present

## 2023-01-12 LAB — CBC
HCT: 31.7 % — ABNORMAL LOW (ref 39.0–52.0)
Hemoglobin: 11.3 g/dL — ABNORMAL LOW (ref 13.0–17.0)
MCH: 32 pg (ref 26.0–34.0)
MCHC: 35.6 g/dL (ref 30.0–36.0)
MCV: 89.8 fL (ref 80.0–100.0)
Platelets: 372 10*3/uL (ref 150–400)
RBC: 3.53 MIL/uL — ABNORMAL LOW (ref 4.22–5.81)
RDW: 12.9 % (ref 11.5–15.5)
WBC: 9.3 10*3/uL (ref 4.0–10.5)
nRBC: 0 % (ref 0.0–0.2)

## 2023-01-12 LAB — QUANTIFERON-TB GOLD PLUS: QuantiFERON-TB Gold Plus: NEGATIVE

## 2023-01-12 LAB — QUANTIFERON-TB GOLD PLUS (RQFGPL)
QuantiFERON Mitogen Value: >10 IU/mL
QuantiFERON Nil Value: 0.01 IU/mL
QuantiFERON TB1 Ag Value: 0.02 IU/mL
QuantiFERON TB2 Ag Value: 0.02 IU/mL

## 2023-01-12 LAB — BASIC METABOLIC PANEL
Anion gap: 10 (ref 5–15)
BUN: 22 mg/dL (ref 8–23)
CO2: 23 mmol/L (ref 22–32)
Calcium: 8.1 mg/dL — ABNORMAL LOW (ref 8.9–10.3)
Chloride: 101 mmol/L (ref 98–111)
Creatinine, Ser: 0.97 mg/dL (ref 0.61–1.24)
GFR, Estimated: >60 mL/min (ref >60)
Glucose, Bld: 108 mg/dL — ABNORMAL HIGH (ref 70–99)
Potassium: 4.3 mmol/L (ref 3.5–5.1)
Sodium: 134 mmol/L — ABNORMAL LOW (ref 135–145)

## 2023-01-12 MED ORDER — PREDNISONE 20 MG PO TABS
40.0000 mg | ORAL_TABLET | Freq: Every day | ORAL | 0 refills | Status: DC
  Filled 2023-01-12: qty 2, 1d supply, fill #0

## 2023-01-12 MED ORDER — AMOXICILLIN-POT CLAVULANATE 875-125 MG PO TABS
1.0000 | ORAL_TABLET | Freq: Two times a day (BID) | ORAL | 0 refills | Status: DC
  Filled 2023-01-12: qty 11, 6d supply, fill #0

## 2023-01-12 MED ORDER — TRAZODONE HCL 50 MG PO TABS: 25.0000 mg | ORAL_TABLET | Freq: Every day | ORAL | Status: DC

## 2023-01-12 MED ORDER — TUDORZA PRESSAIR 400 MCG/ACT IN AEPB: 1.0000 | INHALATION_SPRAY | Freq: Two times a day (BID) | RESPIRATORY_TRACT | Status: AC

## 2023-01-12 MED ORDER — IPRATROPIUM-ALBUTEROL 0.5-2.5 (3) MG/3ML IN SOLN
3.0000 mL | Freq: Three times a day (TID) | RESPIRATORY_TRACT | Status: DC
  Administered 2023-01-12 (×2): 3 mL via RESPIRATORY_TRACT
  Filled 2023-01-12 (×2): qty 3

## 2023-01-12 MED ORDER — GABAPENTIN 300 MG PO CAPS: 300.0000 mg | ORAL_CAPSULE | Freq: Two times a day (BID) | ORAL | 0 refills | Status: AC

## 2023-01-12 NOTE — Progress Notes (Signed)
CSW spoke with Marcus Martin at [facility] who states the patient can return to the facility today.  Patient will go to room A10 - patient to be transported via [place]. The number to call for report is [phone]. RN to call [facility] and report when ready.  Madilyn Fireman, MSW, LCSW Transitions of Care  Clinical Social Worker II [phone]

## 2023-01-12 NOTE — Discharge Summary (Signed)
[facility] Discharge Summary  Patient name: Marcus Martin Medical record number: [id number] Date of birth: July 09, 1933 Age: 89 y.o. Gender: male Date of Admission: 01/08/2023  Date of Discharge: 01/12/2023 Admitting Physician: Gerrit Heck, MD  Primary Care Provider: Curly Rim, MD Consultants: Pulmonology  Indication for Hospitalization: Acute Hypoxic Respiratory failure  Brief Hospital Course:  Ladarrius Lipinski is an 89 y.o. male who was admitted for acute hypoxemic respiratory failure. PMH significant for SVT, NSTEMI s/p PCI, COPD, HTN, HLD, a-fib, prior DVT/PE s/p IVC filter, and chronic hyponatremia.  Brief Hospital course as below.  Acute Hypoxemic Respiratory Failure Patient presented from assisted living facility with productive cough and worsening shortness of breath.  Found to have acute hypoxic respiratory failure.  Patient had significant diffuse bilateral expiratory wheezing with increased sputum production (in the setting of COPD).  He was started on cefepime and azithromycin.  Additionally chest CT was obtained which revealed a right upper lobe cavitary pneumonia, versus mass.  Pulmonology was consulted, we believe this cavitary lesion is likely infectious at this time.  Pulmonology recommended covering for anaerobes, and IV Unasyn was started.  Patient received 2 days of cefepime, 2 days of IV Unasyn, a full 3-day course of azithromycin and transition to p.o. Vantin on 01/11/2023.  QuantiFERON gold assay was obtained, to assess for TB, and still pending at time of discharge.  Additionally, patient was experiencing exacerbation of COPD-secondary to pneumonia.  He was started on triple therapy, receive DuoNebs as needed and started on prednisone. CPAP was continued for sleep. He required 2L [place], and then weaned to room air on 2/9.  Patient maintained oxygen saturation greater than 90% with ambulation at time of discharge.  Discharge home with 11  tablets of Augmentin (to complete 10-day course), 1 tablet of prednisone (to complete 5-day course), and triple therapy inhalers.  He will follow-up with pulmonology outpatient, who also recommend a CT chest and approximately 6 weeks to confirm resolution of pneumonia.  Other chronic medical conditions were treated medically and stable at time of discharge.  Follow-up recommendations Recommend patient continue triple therapy for COPD QuantiFERON gold test pending at time of discharge.  If positive, recommend speaking with pulmonology and may need to call health department. Ensure outpatient pulm follow-up, needs repeat CT in 6 to 8 weeks around March 18 - April 1.  Discharge Diagnoses/Problem List:  * Acute hypoxic respiratory failure (HCC)  Disposition: ALF  Discharge Condition: Stable  Discharge Exam:  General: NAD, chronically ill appearing Cardio: RRR, no murmurs Resp: Bilateral expiratory wheezing. Normal WOB on RA GI: Soft, not distended, not tender.  Significant Labs and Imaging:  Recent Labs  Lab 01/11/23 0337 01/12/23 0400  WBC 11.3* 9.3  HGB 11.4* 11.3*  HCT 32.0* 31.7*  PLT 356 372   Recent Labs  Lab 01/11/23 0337 01/12/23 0400  NA 131* 134*  K 4.0 4.3  CL 101 101  CO2 23 23  GLUCOSE 117* 108*  BUN 34* 22  CREATININE 0.98 0.97  CALCIUM 8.1* 8.1*   CT Angio Chest Pulmonary Embolism (PE) W or WO Contrast Result Date: 01/08/2023 IMPRESSION: 3.7 x 3.6 cm irregular thick walled cavitary mass in the medial right upper lobe with surrounding ground-glass consolidation. Differential considerations would include cavitary pneumonia, atypical infection, or malignancy. Coronary artery disease. Aortic Atherosclerosis (ICD10-I70.0).  DG Chest Port 1 View Result Date: 01/08/2023 IMPRESSION: No acute cardiopulmonary disease.   Results/Tests Pending at Time of Discharge:  QuantiFERON Gold TB  assay  Discharge Medications:  Allergies as of 01/12/2023       Reactions    Ticagrelor Other (See Comments)   "GI Hemorrhage"   Tetanus Toxoids Other (See Comments)   "Horse serum only"   Coffea Arabica    Other reaction(s): Other (See Comments) DECAF coffee causes vision changes per pt. DECAF coffee causes vision changes per pt.   Niacin    Other reaction(s): Flushing (disorder), Other (See Comments) [facility] but patient can not remember exact reaction Per [facility] records but patient can not remember exact reaction   Niacin And Related Other (See Comments)   Flushing   Niacin Er Other (See Comments)   Flushing   Simvastatin Other (See Comments)   "Muscle pain" Other reaction(s): Muscle pain Other reaction(s): Muscle pain (finding), Muscle pain (finding), Muscle pain (finding), Muscle pain (finding), Other (See Comments)   Tiotropium    Other reaction(s): Retention of urine        Medication List     STOP taking these medications    amLODipine 5 MG tablet Commonly known as: NORVASC   silver sulfADIAZINE 1 % cream Commonly known as: SILVADENE       TAKE these medications    acetaminophen 500 MG tablet Commonly known as: TYLENOL Take 500 mg by mouth every 8 (eight) hours as needed for mild pain, headache or fever.   albuterol 108 (90 Base) MCG/ACT inhaler Commonly known as: VENTOLIN HFA Inhale 2 puffs into the lungs every 6 (six) hours as needed for wheezing or shortness of breath.   amoxicillin-clavulanate 875-125 MG tablet Commonly known as: AUGMENTIN Take 1 tablet by mouth every 12 (twelve) hours.   ascorbic acid 500 MG tablet Commonly known as: VITAMIN C Take 500 mg by mouth daily.   aspirin EC 81 MG tablet Take 81 mg by mouth daily. Swallow whole.   atorvastatin 20 MG tablet Commonly known as: LIPITOR Take 20 mg by mouth every evening.   CALCIUM 500/VITAMIN D PO Take 1 tablet by mouth daily.   carboxymethylcellulose 0.5 % Soln Commonly known as: REFRESH PLUS Place 1 drop into both eyes in the morning, at noon, in the  evening, and at bedtime.   docusate sodium 100 MG capsule Commonly known as: COLACE Take 100 mg by mouth 2 (two) times daily.   ferrous sulfate 325 (65 FE) MG tablet Take 325 mg by mouth every Monday, Wednesday, and Friday.   furosemide 20 MG tablet Commonly known as: LASIX Take 20 mg by mouth daily as needed for fluid.   gabapentin 300 MG capsule Commonly known as: NEURONTIN Take 1 capsule (300 mg total) by mouth 2 (two) times daily. What changed:  how much to take when to take this additional instructions Another medication with the same name was removed. Continue taking this medication, and follow the directions you see here.   guaiFENesin 600 MG 12 hr tablet Commonly known as: MUCINEX Take 600 mg by mouth every 12 (twelve) hours as needed for cough.   ipratropium-albuterol 0.5-2.5 (3) MG/3ML Soln Commonly known as: DUONEB Take 3 mLs by nebulization every 6 (six) hours.   lanolin/mineral oil Lotn Apply 1 Application topically daily as needed for dry skin.   latanoprost 0.005 % ophthalmic solution Commonly known as: XALATAN Place 1 drop into both eyes at bedtime.   lisinopril 10 MG tablet Commonly known as: ZESTRIL Take 10 mg by mouth daily. What changed: Another medication with the same name was removed. Continue taking this medication,  and follow the directions you see here.   LUTEIN PO Take 1 capsule by mouth daily.   multivitamin tablet Take 1 tablet by mouth daily.   nystatin powder Commonly known as: MYCOSTATIN/NYSTOP Apply 1 Application topically daily as needed (For groin).   pantoprazole 40 MG tablet Commonly known as: PROTONIX Take 1 tablet (40 mg total) by mouth 2 (two) times daily before a meal.   polyethylene glycol 17 g packet Commonly known as: MIRALAX / GLYCOLAX Take 17 g by mouth daily as needed for mild constipation.   predniSONE 20 MG tablet Commonly known as: DELTASONE Take 2 tablets (40 mg total) by mouth daily with  breakfast. Start taking on: January 13, 2023   RISA-BID PROBIOTIC PO Take 1 capsule by mouth daily.   senna 8.6 MG Tabs tablet Commonly known as: SENOKOT Take 2 tablets by mouth daily as needed for mild constipation.   Simbrinza 1-0.2 % Susp Generic drug: Brinzolamide-Brimonidine Place 1 drop into both eyes in the morning and at bedtime.   sodium chloride 1 g tablet Take 1 tablet (1 g total) by mouth 3 (three) times daily with meals.   sucralfate 1 GM/10ML suspension Commonly known as: CARAFATE Take 10 mLs (1 g total) by mouth 4 (four) times daily -  with meals and at bedtime.   tamsulosin 0.4 MG Caps capsule Commonly known as: FLOMAX Take 0.4 mg by mouth at bedtime. Hold for SBP <110   traZODone 50 MG tablet Commonly known as: DESYREL Take 0.5 tablets (25 mg total) by mouth at bedtime. What changed: how much to take   Triple Antibiotic Oint Place 1 application onto the skin 2 (two) times daily as needed (minor wounds/cuts).   Tudorza Pressair 400 MCG/ACT Aepb Generic drug: Aclidinium Bromide Inhale 1 puff into the lungs in the morning and at bedtime. What changed: when to take this   Vitamin D3 25 MCG (1000 UT) Caps Take 1,000 Units by mouth daily.   Wixela Inhub 250-50 MCG/ACT Aepb Generic drug: fluticasone-salmeterol Inhale 1 puff into the lungs in the morning and at bedtime.   zinc oxide 20 % ointment Apply 1 Application topically daily as needed (for buttocks).        Discharge Instructions: Please refer to Patient Instructions section of EMR for full details.  Patient was counseled important signs and symptoms that should prompt return to medical care, changes in medications, dietary instructions, activity restrictions, and follow up appointments.   Follow-Up Appointments:  Follow-up Information     Corrington, Kip A, MD. Schedule an appointment as soon as possible for a visit today.   Specialty: Family Medicine Why: Please call PCP to make an  appointment for hospital follow-up in the next 1-2 days. Contact information: [place] [address] [facility] [place]. Go on 02/09/2023.   Specialty: Pulmonology Why: 1:00 PM  (show up 20 minutes early) Contact information: [address] [place] [id number] [place], [place], [place] 01/12/2023, 12:19 PM PGY-1, [place]

## 2023-01-12 NOTE — Telephone Encounter (Signed)
Patient is scheduled on 02/08/2022 at 1pm with Dr. Silas Flood.

## 2023-01-12 NOTE — Discharge Instructions (Addendum)
Dear Marcus Martin,  Thank you for letting us participate in your care. You were hospitalized for shortness of breath and diagnosed with Acute hypoxic respiratory failure ([place]). This was caused by pneumonia and COPD exacerbation. You were treated with antibiotics, prednisone, and several inhalers.   POST-HOSPITAL & CARE INSTRUCTIONS Take Augmentin twice daily, until you are out of pills. You last dose should be the evening of 01/17/2023. Please take one more dose of prednisone on 01/13/2023, then stop. Take Tudorza 1 puff in the morning and at bedtime for COPD Take Wixela INHUB 1 puff in morning and at bedtime COPD Take Albuterol 2 puffs every 6 hours as needed for breathing Go to your follow up appointments (listed below)  DOCTOR'S APPOINTMENT   Future Appointments  Date Time Provider [place]  02/09/2023  1:00 PM Hunsucker, Bonna Gains, MD [facility] None    Follow-up Information     Corrington, Kip A, MD. Schedule an appointment as soon as possible for a visit today.   Specialty: Family Medicine Why: Please call PCP to make an appointment for hospital follow-up in the next 1-2 days. Contact information: [place] [address] [facility] [place]. Go on 02/09/2023.   Specialty: Pulmonology Why: 1:00 PM  (show up 20 minutes early) Contact information: [place] [address] [place] [id number] [phone]               Take care and be well!  [facility]  [place], [place] [address] [phone]

## 2023-01-12 NOTE — Progress Notes (Signed)
     Daily Progress Note Intern Pager: [phone]  Patient name: Marcus Martin Medical record number: [id number] Date of birth: 1933-08-03 Age: 89 y.o. Gender: male  Primary Care Provider: Corrington, Delsa Grana, MD Consultants: Pulmonology Code Status: DNR  Pt Overview and Major Events to Date:  2/5: Admitted  Assessment and Plan: Marcus Martin is a 89 y.o. male PMH SVT, NSTEMI s/p PCI, COPD, hypertension, HLD, atrial fibrillation, DVT/PE s/p IVC filter placement (previously on Eliquis), chronic hyponatremia, squamous cell carcinoma of the skin, GERD, peripheral neuropathy, melanoma and bilateral hearing loss presenting with shortness of breath and cough for 1 week. Differential and plan below   MEDICALLY STABLE FOR DISCHARGE  * Acute hypoxic respiratory failure (HCC) Resolving. Weaned to RA this morning by primary provider, with sats >94%. Will ambulate with pulse ox, prior to discharge to asssess O2 requirement. - Continue prednisone course 40 mg daily for 5 days (2/6 - 2/10) - Completed Unasyn IV (2/6-2/8)  and IV azithromycin (2/5 - 2/8) - Augmentin (2/8-2/14) - O2 saturation goal 88-92% - Breo, Incruse  - Incentive spirometry - DuoNeb every 6 hours as needed - Wean oxygen as tolerated   FEN/GI: Regular PPx: Lovenox Dispo: Assisted living facility, today  Subjective:  Reports feeling better. Eating breakfast.  Objective: Temp:  [97.7 F (36.5 C)-99.2 F (37.3 C)] 97.7 F (36.5 C) (02/09 0901) Pulse Rate:  [63-94] 94 (02/09 0901) Resp:  [17-18] 17 (02/09 0901) BP: (133-164)/(63-81) 133/63 (02/09 0901) SpO2:  [93 %-100 %] 98 % (02/09 0901) Physical Exam: General: NAD, chronically ill appearing Cardio: RRR, no murmurs Resp: Bilateral expiratory wheezing. Normal WOB on RA GI: Soft, not distended, not tender.  Laboratory: Most recent CBC Lab Results  Component Value Date   WBC 9.3 01/12/2023   HGB 11.3 (L) 01/12/2023   HCT 31.7 (L) 01/12/2023   MCV 89.8  01/12/2023   PLT 372 01/12/2023   Most recent BMP    Latest Ref Rng & Units 01/12/2023    4:00 AM  BMP  Glucose 70 - 99 mg/dL 108   BUN 8 - 23 mg/dL 22   Creatinine 0.61 - 1.24 mg/dL 0.97   Sodium 135 - 145 mmol/L 134   Potassium 3.5 - 5.1 mmol/L 4.3   Chloride 98 - 111 mmol/L 101   CO2 22 - 32 mmol/L 23   Calcium 8.9 - 10.3 mg/dL 8.1    Leslie Dales, DO 01/12/2023, 11:08 AM  PGY-1, [place] Intern pager: [phone], text pages welcome Secure chat group [place]

## 2023-01-12 NOTE — Progress Notes (Signed)
Mobility Specialist - Progress Note   01/12/23 1211  Mobility  Activity Refused mobility   Pt refused mobility. No specific reason given. Will follow up if time permits.   Marcus Martin  Mobility Specialist Please contact via Solicitor or Rehab office at [phone]

## 2023-01-12 NOTE — Progress Notes (Signed)
Mobility Specialist - Progress Note   01/12/23 1515  Mobility  Activity Ambulated with assistance in room  Level of Assistance Moderate assist, patient does 50-74%  Assistive Device Front wheel walker  Distance Ambulated (ft) 40 ft  Activity Response Tolerated well  Mobility Referral Yes  $Mobility charge 1 Mobility   Pt received in bed and agreeable to session. Pt was ModA to stand and navigate RW. No complaints throughout. Pt was returned to bed with all needs met.   Marcus Martin  Mobility Specialist Please contact via Solicitor or Rehab office at [phone]

## 2023-01-15 DIAGNOSIS — K5900 Constipation, unspecified: Secondary | ICD-10-CM | POA: Diagnosis not present

## 2023-01-15 DIAGNOSIS — J441 Chronic obstructive pulmonary disease with (acute) exacerbation: Secondary | ICD-10-CM | POA: Diagnosis not present

## 2023-01-15 DIAGNOSIS — E222 Syndrome of inappropriate secretion of antidiuretic hormone: Secondary | ICD-10-CM | POA: Diagnosis not present

## 2023-01-15 DIAGNOSIS — K2100 Gastro-esophageal reflux disease with esophagitis, without bleeding: Secondary | ICD-10-CM | POA: Diagnosis not present

## 2023-01-16 DIAGNOSIS — E785 Hyperlipidemia, unspecified: Secondary | ICD-10-CM | POA: Diagnosis not present

## 2023-01-16 DIAGNOSIS — I4891 Unspecified atrial fibrillation: Secondary | ICD-10-CM | POA: Diagnosis not present

## 2023-01-16 DIAGNOSIS — N189 Chronic kidney disease, unspecified: Secondary | ICD-10-CM | POA: Diagnosis not present

## 2023-01-16 DIAGNOSIS — E119 Type 2 diabetes mellitus without complications: Secondary | ICD-10-CM | POA: Diagnosis not present

## 2023-01-16 DIAGNOSIS — I10 Essential (primary) hypertension: Secondary | ICD-10-CM | POA: Diagnosis not present

## 2023-01-16 DIAGNOSIS — J449 Chronic obstructive pulmonary disease, unspecified: Secondary | ICD-10-CM | POA: Diagnosis not present

## 2023-01-17 ENCOUNTER — Emergency Department (HOSPITAL_COMMUNITY): Payer: No Typology Code available for payment source

## 2023-01-17 ENCOUNTER — Encounter (HOSPITAL_COMMUNITY): Payer: Self-pay

## 2023-01-17 ENCOUNTER — Other Ambulatory Visit: Payer: Self-pay

## 2023-01-17 ENCOUNTER — Inpatient Hospital Stay (HOSPITAL_COMMUNITY)
Admission: EM | Admit: 2023-01-17 | Discharge: 2023-01-23 | DRG: 299 | Disposition: A | Discharge status: Skilled Nursing Facility | Payer: No Typology Code available for payment source | Source: Skilled Nursing Facility | Attending: Family Medicine | Admitting: Family Medicine

## 2023-01-17 DIAGNOSIS — D649 Anemia, unspecified: Secondary | ICD-10-CM | POA: Diagnosis not present

## 2023-01-17 DIAGNOSIS — Z95828 Presence of other vascular implants and grafts: Secondary | ICD-10-CM

## 2023-01-17 DIAGNOSIS — Z955 Presence of coronary angioplasty implant and graft: Secondary | ICD-10-CM

## 2023-01-17 DIAGNOSIS — D72829 Elevated white blood cell count, unspecified: Secondary | ICD-10-CM | POA: Diagnosis not present

## 2023-01-17 DIAGNOSIS — R5381 Other malaise: Secondary | ICD-10-CM | POA: Diagnosis not present

## 2023-01-17 DIAGNOSIS — J440 Chronic obstructive pulmonary disease with acute lower respiratory infection: Secondary | ICD-10-CM | POA: Diagnosis present

## 2023-01-17 DIAGNOSIS — Z66 Do not resuscitate: Secondary | ICD-10-CM | POA: Diagnosis present

## 2023-01-17 DIAGNOSIS — I70222 Atherosclerosis of native arteries of extremities with rest pain, left leg: Principal | ICD-10-CM | POA: Diagnosis present

## 2023-01-17 DIAGNOSIS — K219 Gastro-esophageal reflux disease without esophagitis: Secondary | ICD-10-CM | POA: Diagnosis present

## 2023-01-17 DIAGNOSIS — Z85820 Personal history of malignant melanoma of skin: Secondary | ICD-10-CM

## 2023-01-17 DIAGNOSIS — Z7982 Long term (current) use of aspirin: Secondary | ICD-10-CM

## 2023-01-17 DIAGNOSIS — J441 Chronic obstructive pulmonary disease with (acute) exacerbation: Secondary | ICD-10-CM | POA: Diagnosis not present

## 2023-01-17 DIAGNOSIS — I252 Old myocardial infarction: Secondary | ICD-10-CM

## 2023-01-17 DIAGNOSIS — E785 Hyperlipidemia, unspecified: Secondary | ICD-10-CM | POA: Diagnosis present

## 2023-01-17 DIAGNOSIS — R338 Other retention of urine: Secondary | ICD-10-CM | POA: Diagnosis present

## 2023-01-17 DIAGNOSIS — Z86711 Personal history of pulmonary embolism: Secondary | ICD-10-CM

## 2023-01-17 DIAGNOSIS — J189 Pneumonia, unspecified organism: Secondary | ICD-10-CM | POA: Diagnosis present

## 2023-01-17 DIAGNOSIS — N179 Acute kidney failure, unspecified: Secondary | ICD-10-CM | POA: Diagnosis present

## 2023-01-17 DIAGNOSIS — I959 Hypotension, unspecified: Secondary | ICD-10-CM | POA: Diagnosis not present

## 2023-01-17 DIAGNOSIS — Z79899 Other long term (current) drug therapy: Secondary | ICD-10-CM

## 2023-01-17 DIAGNOSIS — K2100 Gastro-esophageal reflux disease with esophagitis, without bleeding: Secondary | ICD-10-CM | POA: Diagnosis not present

## 2023-01-17 DIAGNOSIS — R339 Retention of urine, unspecified: Secondary | ICD-10-CM | POA: Diagnosis present

## 2023-01-17 DIAGNOSIS — N401 Enlarged prostate with lower urinary tract symptoms: Secondary | ICD-10-CM | POA: Diagnosis present

## 2023-01-17 DIAGNOSIS — G4733 Obstructive sleep apnea (adult) (pediatric): Secondary | ICD-10-CM | POA: Diagnosis present

## 2023-01-17 DIAGNOSIS — I2510 Atherosclerotic heart disease of native coronary artery without angina pectoris: Secondary | ICD-10-CM | POA: Diagnosis not present

## 2023-01-17 DIAGNOSIS — Z86718 Personal history of other venous thrombosis and embolism: Secondary | ICD-10-CM

## 2023-01-17 DIAGNOSIS — G4700 Insomnia, unspecified: Secondary | ICD-10-CM | POA: Diagnosis not present

## 2023-01-17 DIAGNOSIS — N189 Chronic kidney disease, unspecified: Secondary | ICD-10-CM | POA: Diagnosis not present

## 2023-01-17 DIAGNOSIS — Z87891 Personal history of nicotine dependence: Secondary | ICD-10-CM

## 2023-01-17 DIAGNOSIS — I10 Essential (primary) hypertension: Secondary | ICD-10-CM | POA: Diagnosis not present

## 2023-01-17 DIAGNOSIS — R0989 Other specified symptoms and signs involving the circulatory and respiratory systems: Secondary | ICD-10-CM | POA: Diagnosis not present

## 2023-01-17 DIAGNOSIS — I4891 Unspecified atrial fibrillation: Secondary | ICD-10-CM | POA: Diagnosis present

## 2023-01-17 DIAGNOSIS — I998 Other disorder of circulatory system: Secondary | ICD-10-CM

## 2023-01-17 DIAGNOSIS — Z809 Family history of malignant neoplasm, unspecified: Secondary | ICD-10-CM

## 2023-01-17 DIAGNOSIS — Z888 Allergy status to other drugs, medicaments and biological substances status: Secondary | ICD-10-CM

## 2023-01-17 DIAGNOSIS — Z85828 Personal history of other malignant neoplasm of skin: Secondary | ICD-10-CM

## 2023-01-17 DIAGNOSIS — J449 Chronic obstructive pulmonary disease, unspecified: Secondary | ICD-10-CM | POA: Diagnosis not present

## 2023-01-17 DIAGNOSIS — K5900 Constipation, unspecified: Secondary | ICD-10-CM | POA: Diagnosis not present

## 2023-01-17 DIAGNOSIS — M25552 Pain in left hip: Secondary | ICD-10-CM | POA: Diagnosis not present

## 2023-01-17 DIAGNOSIS — H9193 Unspecified hearing loss, bilateral: Secondary | ICD-10-CM | POA: Diagnosis present

## 2023-01-17 DIAGNOSIS — R600 Localized edema: Secondary | ICD-10-CM | POA: Diagnosis not present

## 2023-01-17 DIAGNOSIS — Z887 Allergy status to serum and vaccine status: Secondary | ICD-10-CM

## 2023-01-17 DIAGNOSIS — G629 Polyneuropathy, unspecified: Secondary | ICD-10-CM | POA: Diagnosis present

## 2023-01-17 LAB — CBC WITH DIFFERENTIAL/PLATELET
Abs Immature Granulocytes: 0.32 10*3/uL — ABNORMAL HIGH (ref 0.00–0.07)
Basophils Absolute: 0 10*3/uL (ref 0.0–0.1)
Basophils Relative: 0 %
Eosinophils Absolute: 0.1 10*3/uL (ref 0.0–0.5)
Eosinophils Relative: 1 %
HCT: 35.3 % — ABNORMAL LOW (ref 39.0–52.0)
Hemoglobin: 11.8 g/dL — ABNORMAL LOW (ref 13.0–17.0)
Immature Granulocytes: 2 %
Lymphocytes Relative: 10 %
Lymphs Abs: 1.7 10*3/uL (ref 0.7–4.0)
MCH: 31.4 pg (ref 26.0–34.0)
MCHC: 33.4 g/dL (ref 30.0–36.0)
MCV: 93.9 fL (ref 80.0–100.0)
Monocytes Absolute: 1 10*3/uL (ref 0.1–1.0)
Monocytes Relative: 6 %
Neutro Abs: 14.5 10*3/uL — ABNORMAL HIGH (ref 1.7–7.7)
Neutrophils Relative %: 81 %
Platelets: 319 10*3/uL (ref 150–400)
RBC: 3.76 MIL/uL — ABNORMAL LOW (ref 4.22–5.81)
RDW: 13.9 % (ref 11.5–15.5)
WBC: 17.7 10*3/uL — ABNORMAL HIGH (ref 4.0–10.5)
nRBC: 0 % (ref 0.0–0.2)

## 2023-01-17 LAB — PROTIME-INR
INR: 1.3 — ABNORMAL HIGH (ref 0.8–1.2)
Prothrombin Time: 15.7 seconds — ABNORMAL HIGH (ref 11.4–15.2)

## 2023-01-17 LAB — COMPREHENSIVE METABOLIC PANEL
ALT: 53 U/L — ABNORMAL HIGH (ref 0–44)
AST: 35 U/L (ref 15–41)
Albumin: 2.7 g/dL — ABNORMAL LOW (ref 3.5–5.0)
Alkaline Phosphatase: 77 U/L (ref 38–126)
Anion gap: 12 (ref 5–15)
BUN: 68 mg/dL — ABNORMAL HIGH (ref 8–23)
CO2: 19 mmol/L — ABNORMAL LOW (ref 22–32)
Calcium: 8.4 mg/dL — ABNORMAL LOW (ref 8.9–10.3)
Chloride: 99 mmol/L (ref 98–111)
Creatinine, Ser: 2.37 mg/dL — ABNORMAL HIGH (ref 0.61–1.24)
GFR, Estimated: 26 mL/min — ABNORMAL LOW (ref >60)
Glucose, Bld: 117 mg/dL — ABNORMAL HIGH (ref 70–99)
Potassium: 4.9 mmol/L (ref 3.5–5.1)
Sodium: 130 mmol/L — ABNORMAL LOW (ref 135–145)
Total Bilirubin: 1 mg/dL (ref 0.3–1.2)
Total Protein: 6.2 g/dL — ABNORMAL LOW (ref 6.5–8.1)

## 2023-01-17 LAB — HEPARIN LEVEL (UNFRACTIONATED): Heparin Unfractionated: 1.04 IU/mL — ABNORMAL HIGH (ref 0.30–0.70)

## 2023-01-17 LAB — URINALYSIS, ROUTINE W REFLEX MICROSCOPIC
Bilirubin Urine: NEGATIVE
Glucose, UA: NEGATIVE mg/dL
Hgb urine dipstick: NEGATIVE
Ketones, ur: NEGATIVE mg/dL
Leukocytes,Ua: NEGATIVE
Nitrite: NEGATIVE
Protein, ur: NEGATIVE mg/dL
Specific Gravity, Urine: 1.035 — ABNORMAL HIGH (ref 1.005–1.030)
pH: 5 (ref 5.0–8.0)

## 2023-01-17 LAB — LACTIC ACID, PLASMA: Lactic Acid, Venous: 1.1 mmol/L (ref 0.5–1.9)

## 2023-01-17 LAB — APTT: aPTT: 32 seconds (ref 24–36)

## 2023-01-17 MED ORDER — HYDROMORPHONE HCL 1 MG/ML IJ SOLN: 0.5000 mg | INTRAMUSCULAR | Status: DC | PRN

## 2023-01-17 MED ORDER — GABAPENTIN 300 MG PO CAPS
300.0000 mg | ORAL_CAPSULE | Freq: Two times a day (BID) | ORAL | Status: DC
  Administered 2023-01-17 – 2023-01-23 (×12): 300 mg via ORAL
  Filled 2023-01-17 (×12): qty 1

## 2023-01-17 MED ORDER — HYDROMORPHONE HCL 1 MG/ML IJ SOLN
0.5000 mg | INTRAMUSCULAR | Status: DC | PRN
  Administered 2023-01-17 – 2023-01-19 (×6): 0.5 mg via INTRAVENOUS
  Filled 2023-01-17 (×7): qty 0.5

## 2023-01-17 MED ORDER — TRAZODONE HCL 50 MG PO TABS
25.0000 mg | ORAL_TABLET | Freq: Every day | ORAL | Status: DC
  Administered 2023-01-17 – 2023-01-22 (×6): 25 mg via ORAL
  Filled 2023-01-17 (×6): qty 1

## 2023-01-17 MED ORDER — ATORVASTATIN CALCIUM 10 MG PO TABS
20.0000 mg | ORAL_TABLET | Freq: Every evening | ORAL | Status: DC
  Administered 2023-01-18 – 2023-01-22 (×5): 20 mg via ORAL
  Filled 2023-01-17 (×5): qty 2

## 2023-01-17 MED ORDER — PANTOPRAZOLE SODIUM 40 MG PO TBEC
40.0000 mg | DELAYED_RELEASE_TABLET | Freq: Two times a day (BID) | ORAL | Status: DC
  Administered 2023-01-18 – 2023-01-23 (×11): 40 mg via ORAL
  Filled 2023-01-17 (×11): qty 1

## 2023-01-17 MED ORDER — BRINZOLAMIDE 1 % OP SUSP
1.0000 [drp] | Freq: Three times a day (TID) | OPHTHALMIC | Status: DC
  Administered 2023-01-17 – 2023-01-23 (×18): 1 [drp] via OPHTHALMIC
  Filled 2023-01-17: qty 10

## 2023-01-17 MED ORDER — POLYVINYL ALCOHOL 1.4 % OP SOLN: 1.0000 [drp] | Freq: Three times a day (TID) | OPHTHALMIC | Status: DC | PRN

## 2023-01-17 MED ORDER — ALBUTEROL SULFATE (2.5 MG/3ML) 0.083% IN NEBU
2.5000 mg | INHALATION_SOLUTION | Freq: Four times a day (QID) | RESPIRATORY_TRACT | Status: DC | PRN
  Administered 2023-01-20: 2.5 mg via RESPIRATORY_TRACT
  Filled 2023-01-17: qty 3

## 2023-01-17 MED ORDER — ASPIRIN 81 MG PO TBEC
81.0000 mg | DELAYED_RELEASE_TABLET | Freq: Every day | ORAL | Status: DC
  Administered 2023-01-18 – 2023-01-23 (×6): 81 mg via ORAL
  Filled 2023-01-17 (×6): qty 1

## 2023-01-17 MED ORDER — OXYCODONE-ACETAMINOPHEN 5-325 MG PO TABS
1.0000 | ORAL_TABLET | Freq: Once | ORAL | Status: AC
  Administered 2023-01-17: 1 via ORAL
  Filled 2023-01-17: qty 1

## 2023-01-17 MED ORDER — AMOXICILLIN-POT CLAVULANATE 500-125 MG PO TABS
1.0000 | ORAL_TABLET | Freq: Two times a day (BID) | ORAL | Status: AC
  Administered 2023-01-17 – 2023-01-19 (×5): 1 via ORAL
  Filled 2023-01-17 (×5): qty 1

## 2023-01-17 MED ORDER — SODIUM CHLORIDE 1 G PO TABS
1.0000 g | ORAL_TABLET | Freq: Three times a day (TID) | ORAL | Status: DC
  Administered 2023-01-18 – 2023-01-23 (×17): 1 g via ORAL
  Filled 2023-01-17 (×17): qty 1

## 2023-01-17 MED ORDER — UMECLIDINIUM BROMIDE 62.5 MCG/ACT IN AEPB
1.0000 | INHALATION_SPRAY | Freq: Every day | RESPIRATORY_TRACT | Status: DC
  Administered 2023-01-18 – 2023-01-23 (×6): 1 via RESPIRATORY_TRACT
  Filled 2023-01-17: qty 7

## 2023-01-17 MED ORDER — SODIUM CHLORIDE 0.9 % IV SOLN: INTRAVENOUS | Status: AC

## 2023-01-17 MED ORDER — HYDROMORPHONE HCL 1 MG/ML IJ SOLN
0.5000 mg | INTRAMUSCULAR | Status: DC | PRN
  Administered 2023-01-17: 0.5 mg via INTRAVENOUS
  Filled 2023-01-17: qty 1

## 2023-01-17 MED ORDER — FLUTICASONE FUROATE-VILANTEROL 200-25 MCG/ACT IN AEPB
1.0000 | INHALATION_SPRAY | Freq: Every day | RESPIRATORY_TRACT | Status: DC
  Administered 2023-01-18 – 2023-01-23 (×6): 1 via RESPIRATORY_TRACT
  Filled 2023-01-17: qty 28

## 2023-01-17 MED ORDER — ZINC OXIDE 20 % EX OINT: 1.0000 | TOPICAL_OINTMENT | Freq: Every day | CUTANEOUS | Status: DC | PRN

## 2023-01-17 MED ORDER — DOCUSATE SODIUM 100 MG PO CAPS
100.0000 mg | ORAL_CAPSULE | Freq: Two times a day (BID) | ORAL | Status: DC
  Administered 2023-01-17 – 2023-01-23 (×11): 100 mg via ORAL
  Filled 2023-01-17 (×12): qty 1

## 2023-01-17 MED ORDER — GUAIFENESIN ER 600 MG PO TB12
600.0000 mg | ORAL_TABLET | Freq: Two times a day (BID) | ORAL | Status: DC | PRN
  Administered 2023-01-20 – 2023-01-22 (×3): 600 mg via ORAL
  Filled 2023-01-17 (×3): qty 1

## 2023-01-17 MED ORDER — HEPARIN (PORCINE) 25000 UT/250ML-% IV SOLN
900.0000 [IU]/h | INTRAVENOUS | Status: DC
  Administered 2023-01-18: 1000 [IU]/h via INTRAVENOUS
  Filled 2023-01-17: qty 250

## 2023-01-17 MED ORDER — HEPARIN BOLUS VIA INFUSION
5000.0000 [IU] | Freq: Once | INTRAVENOUS | Status: AC
  Administered 2023-01-17: 5000 [IU] via INTRAVENOUS
  Filled 2023-01-17: qty 5000

## 2023-01-17 MED ORDER — SODIUM CHLORIDE 0.9 % IV BOLUS
500.0000 mL | Freq: Once | INTRAVENOUS | Status: AC
  Administered 2023-01-17: 500 mL via INTRAVENOUS

## 2023-01-17 MED ORDER — HEPARIN (PORCINE) 25000 UT/250ML-% IV SOLN
1200.0000 [IU]/h | INTRAVENOUS | Status: DC
  Administered 2023-01-17: 1200 [IU]/h via INTRAVENOUS
  Filled 2023-01-17: qty 250

## 2023-01-17 MED ORDER — BRIMONIDINE TARTRATE 0.2 % OP SOLN
1.0000 [drp] | Freq: Three times a day (TID) | OPHTHALMIC | Status: DC
  Administered 2023-01-17 – 2023-01-23 (×18): 1 [drp] via OPHTHALMIC
  Filled 2023-01-17: qty 5

## 2023-01-17 MED ORDER — FERROUS SULFATE 325 (65 FE) MG PO TABS
325.0000 mg | ORAL_TABLET | ORAL | Status: DC
  Administered 2023-01-19 – 2023-01-22 (×2): 325 mg via ORAL
  Filled 2023-01-17 (×3): qty 1

## 2023-01-17 MED ORDER — NYSTATIN 100000 UNIT/GM EX POWD: 1.0000 | Freq: Two times a day (BID) | CUTANEOUS | Status: DC | PRN

## 2023-01-17 MED ORDER — IOHEXOL 350 MG/ML SOLN
100.0000 mL | Freq: Once | INTRAVENOUS | Status: AC | PRN
  Administered 2023-01-17: 100 mL via INTRAVENOUS

## 2023-01-17 MED ORDER — LATANOPROST 0.005 % OP SOLN
1.0000 [drp] | Freq: Every day | OPHTHALMIC | Status: DC
  Administered 2023-01-17 – 2023-01-22 (×6): 1 [drp] via OPHTHALMIC
  Filled 2023-01-17: qty 2.5

## 2023-01-17 MED ORDER — TAMSULOSIN HCL 0.4 MG PO CAPS: 0.4000 mg | ORAL_CAPSULE | Freq: Every day | ORAL | Status: DC

## 2023-01-17 NOTE — Progress Notes (Signed)
I met with the patient as well as family at bedside including his son and daughter-in-law.  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  He does reconstitute common femoral/profunda and has no runoff in the left lower extremity.  Again discussed axillary bifemoral bypass versus left above-knee amputation.  Family has ultimately elected for left above-knee amputation which I think is a good decision and patient is in agreement.  He is quite frail and still recovering from pneumonia with very poor pulmonary toilet.  I think he would be very high risk for prolonged intubation in the ICU which he does not want based on his previous DNR/DNI wishes.  I discussed some risk of above-knee amputation not healing.  I will post for left above-knee amputation on Friday with me.  [name], MD [facility] Office: [place]

## 2023-01-17 NOTE — Progress Notes (Signed)
FMTS Brief Progress Note  S: In to see patient for evening rounds. Was sleeping when I entered. He reports continued L leg pain but otherwise no complaints.  O: BP (!) 141/99 (BP Location: Left Arm)   Pulse 89   Temp 98.3 F (36.8 C) (Axillary)   Resp 19   Ht 5' 11"$  (1.803 m)   Wt 74.8 kg   SpO2 97%   BMI 23.01 kg/m   Gen: elderly male, NAD Resp: normal effort on room air  A/P: Urinary Retention Had 613m on bladder scan earlier this shift. Foley placed.  Hypotension Had episode of hypotension with BP 88/40 earlier this evening. After foley placed, he was given 500cc NS bolus and BP now improved. Suspect this was related to his acute urinary retention.  Remainder per H&P.   WAlcus Dad MD 01/17/2023, 11:39 PM PGY-3, [facility] Night Resident  Please page [phone] questions.

## 2023-01-17 NOTE — Progress Notes (Signed)
[place] for heparin  Indication: L leg ischemia  Allergies  Allergen Reactions   Brilinta [Ticagrelor] Other (See Comments)    GI Hemorrhage   Coffea Arabica     DECAF coffee causes vision changes    Tetanus Toxoids Other (See Comments)    "Horse serum only" Unknown reaction Documented on MAR    Zocor [Simvastatin] Other (See Comments)    Myalgias    Spiriva Respimat [Tiotropium Bromide Monohydrate] Other (See Comments)    Urinary retention    Niacin Other (See Comments)    Flushing      Patient Measurements: Height: 5' 11"$  (180.3 cm) Weight: 74.8 kg (165 lb) IBW/kg (Calculated) : 75.3 Heparin Dosing Weight: 74.8 kg  Vital Signs: Temp: 98.3 F (36.8 C) (02/14 2017) Temp Source: Axillary (02/14 2017) BP: 141/99 (02/14 2242) Pulse Rate: 89 (02/14 2242)  Labs: Recent Labs    01/17/23 1213 01/17/23 2116  HGB 11.8*  --   HCT 35.3*  --   PLT 319  --   APTT 32  --   LABPROT 15.7*  --   INR 1.3*  --   HEPARINUNFRC  --  1.04*  CREATININE 2.37*  --      Estimated Creatinine Clearance: 23.2 mL/min (A) (by C-G formula based on SCr of 2.37 mg/dL (H)).   Assessment: 63 YOM with L leg ischemia. No anticoagulant PTA. Hgb 11.8, plt 319. Pharmacy consulted to dose IV heparin.   Heparin level 1.04 (supratherapeutic) on infusion at 1200 units/hr. No issues with line or bleeding reported per RN. Level drawn from arm opposite where heparin running.  Goal of Therapy:  Heparin level 0.3-0.7 units/ml Monitor platelets by anticoagulation protocol: Yes   Plan:  Hold heparin x 1 hour then restart heparin infusion at 1000 units/hr Obtain 8 hour heparin level  Sherlon Handing, PharmD, BCPS Please see amion for complete clinical pharmacist phone list 01/17/2023  10:45 PM

## 2023-01-17 NOTE — ED Notes (Signed)
ED TO INPATIENT HANDOFF REPORT  ED Nurse Name and Phone #: Ivin Poot Name/Age/Gender Marcus Martin 89 y.o. male Room/Bed: 034C/034C  Code Status   Code Status: Prior  Home/SNF/Other Skilled nursing facility Patient oriented to: self, place, time, and situation Is this baseline? Yes   Triage Complete: Triage complete  Chief Complaint Lower limb ischemia [I99.8]  Triage Note Pt brought in by EMS from [facility] after waking up and having L leg pain. Pt's leg is numb, cold to touch, no palpable pulse and no pulse found with doppler. Provider notified. Pt recently here with pneumonia and wears 2L [place] chronically   Allergies Allergies  Allergen Reactions   Ticagrelor Other (See Comments)    "GI Hemorrhage"   Tetanus Toxoids Other (See Comments)    "Horse serum only"    Coffea Arabica     Other reaction(s): Other (See Comments) DECAF coffee causes vision changes per pt. DECAF coffee causes vision changes per pt.    Niacin     Other reaction(s): Flushing (disorder), Other (See Comments) [facility] but [facility] can not remember exact reaction [facility] but patient can not remember exact reaction    Niacin And Related Other (See Comments)    Flushing   Niacin Er Other (See Comments)    Flushing   Simvastatin Other (See Comments)    "Muscle pain" Other reaction(s): Muscle pain Other reaction(s): Muscle pain (finding), Muscle pain (finding), Muscle pain (finding), Muscle pain (finding), Other (See Comments)   Tiotropium     Other reaction(s): Retention of urine    Level of Care/Admitting Diagnosis ED Disposition     ED Disposition  Admit   Condition  --   [place]: [facility] [[id number]]  Level of Care: Med-Surg [16]  May place patient in observation at [facility] or [place] if equivalent level of care is available:: No  Covid Evaluation: Asymptomatic - no recent exposure (last 10 days) testing not required   Diagnosis: Lower limb ischemia [id number]  Admitting Physician: Gerrit Heck [id number]  Attending Physician: Lenoria Chime [id number]          B Medical/Surgery History Past Medical History:  Diagnosis Date   Bilateral hearing loss    COPD (chronic obstructive pulmonary disease) ([place])    DVT (deep venous thrombosis) (HCC)    GERD (gastroesophageal reflux disease)    History of pulmonary embolus (PE)    HLD (hyperlipidemia)    HTN (hypertension)    Melanoma (HCC)    NSTEMI (non-ST elevated myocardial infarction) ([place])    OSA (obstructive sleep apnea)    Peripheral neuropathy    Squamous cell skin cancer    Past Surgical History:  Procedure Laterality Date   BIOPSY  11/07/2021   Procedure: BIOPSY;  Surgeon: Doran Stabler, MD;  Location: [facility];  Service: Gastroenterology;;   ESOPHAGOGASTRODUODENOSCOPY (EGD) WITH PROPOFOL N/A 11/07/2021   Procedure: ESOPHAGOGASTRODUODENOSCOPY (EGD) WITH PROPOFOL;  Surgeon: Doran Stabler, MD;  Location: [facility];  Service: Gastroenterology;  Laterality: N/A;   PERCUTANEOUS CORONARY STENT INTERVENTION (PCI-S)     RCA     A IV Location/Drains/Wounds Patient Lines/Drains/Airways Status     Active Line/Drains/Airways     Name Placement date Placement time Site Days   Peripheral IV 01/17/23 20 G Anterior;Distal;Left;Upper Arm 01/17/23  1213  Arm  less than 1   Peripheral IV 01/17/23 20 G Anterior;Distal;Right;Upper Arm 01/17/23  1250  Arm  less  than 1            Intake/Output Last 24 hours No intake or output data in the 24 hours ending 01/17/23 1509  Labs/Imaging Results for orders placed or performed during the hospital encounter of 01/17/23 (from the past 48 hour(s))  Comprehensive metabolic panel     Status: Abnormal   Collection Time: 01/17/23 12:13 PM  Result Value Ref Range   Sodium 130 (L) 135 - 145 mmol/L   Potassium 4.9 3.5 - 5.1 mmol/L   Chloride 99 98 - 111 mmol/L   CO2 19 (L) 22 - 32 mmol/L    Glucose, Bld 117 (H) 70 - 99 mg/dL    Comment: Glucose reference range applies only to samples taken after fasting for at least 8 hours.   BUN 68 (H) 8 - 23 mg/dL   Creatinine, Ser 2.37 (H) 0.61 - 1.24 mg/dL   Calcium 8.4 (L) 8.9 - 10.3 mg/dL   Total Protein 6.2 (L) 6.5 - 8.1 g/dL   Albumin 2.7 (L) 3.5 - 5.0 g/dL   AST 35 15 - 41 U/L   ALT 53 (H) 0 - 44 U/L   Alkaline Phosphatase 77 38 - 126 U/L   Total Bilirubin 1.0 0.3 - 1.2 mg/dL   GFR, Estimated 26 (L) >60 mL/min    Comment: (NOTE) Calculated using the CKD-EPI Creatinine Equation (2021)    Anion gap 12 5 - 15    Comment: Performed at [facility], [place] [address], [place], [place] [address]  CBC with Differential     Status: Abnormal   Collection Time: 01/17/23 12:13 PM  Result Value Ref Range   WBC 17.7 (H) 4.0 - 10.5 K/uL   RBC 3.76 (L) 4.22 - 5.81 MIL/uL   Hemoglobin 11.8 (L) 13.0 - 17.0 g/dL   HCT 35.3 (L) 39.0 - 52.0 %   MCV 93.9 80.0 - 100.0 fL   MCH 31.4 26.0 - 34.0 pg   MCHC 33.4 30.0 - 36.0 g/dL   RDW 13.9 11.5 - 15.5 %   Platelets 319 150 - 400 K/uL    Comment: REPEATED TO VERIFY   nRBC 0.0 0.0 - 0.2 %   Neutrophils Relative % 81 %   Neutro Abs 14.5 (H) 1.7 - 7.7 K/uL   Lymphocytes Relative 10 %   Lymphs Abs 1.7 0.7 - 4.0 K/uL   Monocytes Relative 6 %   Monocytes Absolute 1.0 0.1 - 1.0 K/uL   Eosinophils Relative 1 %   Eosinophils Absolute 0.1 0.0 - 0.5 K/uL   Basophils Relative 0 %   Basophils Absolute 0.0 0.0 - 0.1 K/uL   Immature Granulocytes 2 %   Abs Immature Granulocytes 0.32 (H) 0.00 - 0.07 K/uL    Comment: Performed at [facility] [address] [place], [place] [address]  APTT     Status: None   Collection Time: 01/17/23 12:13 PM  Result Value Ref Range   aPTT 32 24 - 36 seconds    Comment: Performed at [facility] [address] [place], [place] [address]  Protime-INR     Status: Abnormal   Collection Time: 01/17/23 12:13 PM  Result Value Ref Range   Prothrombin Time  15.7 (H) 11.4 - 15.2 seconds   INR 1.3 (H) 0.8 - 1.2    Comment: (NOTE) INR goal varies based on device and disease states. Performed at [facility], [place] [address] [place], [place] [address]   Lactic  acid, plasma     Status: None   Collection Time: 01/17/23 12:13 PM  Result Value Ref Range   Lactic Acid, Venous 1.1 0.5 - 1.9 mmol/L    Comment: Performed at [facility] [address], [place], [place] [address]   CT ANGIO AO+BIFEM W & OR WO CONTRAST  Result Date: 01/17/2023 CLINICAL DATA:  Peripheral arterial disease EXAM: CT ANGIOGRAPHY OF ABDOMINAL AORTA WITH ILIOFEMORAL RUNOFF TECHNIQUE: Multidetector CT imaging of the abdomen, pelvis and lower extremities was performed using the standard protocol during bolus administration of intravenous contrast. Multiplanar CT image reconstructions and MIPs were obtained to evaluate the vascular anatomy. RADIATION DOSE REDUCTION: This exam was performed according to the departmental dose-optimization program which includes automated exposure control, adjustment of the mA and/or kV according to patient size and/or use of iterative reconstruction technique. CONTRAST:  146m OMNIPAQUE IOHEXOL 350 MG/ML SOLN COMPARISON:  CT chest abdomen pelvis 11/16/2021 FINDINGS: VASCULAR Aorta: Calcified atheromatous plaque seen throughout the suprarenal abdominal aorta without aneurysm or significant stenosis. The infrarenal abdominal aorta is occluded. Celiac: Mild narrowing at the origin of the celiac artery due to calcified plaque. Left gastric artery originates directly from the aorta. No significant abnormality of the splenic or common hepatic arteries. 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 There is reopacification of the superior mesenteric artery 3.5 cm from the origin consistent with retrograde collateral flow. Renals: Complete to near complete occlusion at the origin of the right main renal artery due to calcified plaque. Moderate  stenosis at the origin of the left main renal artery. IMA: Occluded at the origin. Opacification of distal branches consistent with retrograde collateral flow. RIGHT Lower Extremity Inflow: Right common and external iliac arteries are occluded. Proximal internal iliac artery is occluded. Opacification of distal branches consistent with retrograde collateral flow. Outflow: Flow reconstitutes within the right common femoral artery. Moderate stenosis of the right common femoral artery due to calcified plaque, best seen on image 212 of series 5. Near complete occlusion at the origin of the profunda femoris artery due to calcified plaque. The remainder of the profunda femoris artery is patent. The superficial femoral artery is diffusely calcified but patent without focal high-grade stenosis. Popliteal artery is diminutive but patent. Runoff: Three-vessel runoff to the right ankle. LEFT Lower Extremity Inflow: Left common and external iliac arteries are occluded. Internal iliac artery occluded proximally. Opacification of distal branches consistent with retrograde collateral flow. Outflow: Moderate narrowing of the left common femoral artery due to calcified plaque. Profunda femoris artery is patent without significant stenosis. Left superficial femoral artery is occluded, beginning proximally 9 cm from the origin. Left popliteal artery is occluded. Runoff: No significant opacification of the left lower leg arteries. Veins: No obvious venous abnormality within the limitations of this arterial phase study. Review of the MIP images confirms the above findings. NON-VASCULAR Lower chest: No acute abnormality. Hepatobiliary: Subcentimeter hypodensity at the dome of the right liver lobe is too small to fully characterize. Mild cholelithiasis. No bile duct dilatation. Pancreas: Unremarkable. No pancreatic ductal dilatation or surrounding inflammatory changes. Spleen: Normal in size without focal abnormality. Adrenals/Urinary  Tract: Adrenal glands are normal. Atrophy of the lower pole the right kidney may be due to prior infarct or inflammation. Two small simple cysts at the upper pole of the left kidney measuring up to 2.1 cm do not require further imaging follow-up. No significant abnormality of the bladder or ureters. Stomach/Bowel: Small hiatal hernia. No bowel dilatation to indicate  ileus or obstruction. Descending and sigmoid colon diverticulosis without evidence of acute diverticulitis. Normal appendix. Lymphatic: No enlarged abdominal or pelvic lymph nodes. Reproductive: Moderately enlarged prostate. Other: Diffuse subcutaneous edema of the calves bilaterally. No ascites. Mild rectus sheath diastasis. Fluid collections noted within the rectus sheath bilaterally measuring 9.2 x 2.8 x 1.9 cm (image 79, series 9) on the right and 8.9 x 2.4 x 2.1 cm on the left. (Image 86, series 9. These collections are new compared to the prior examinations and may relate to seromas, hematomas, or abscesses. Musculoskeletal: Bilateral rib deformities consistent with remote healed fractures. Unchanged moderate compression fracture of L3 vertebral body. No new compression fractures are identified. IMPRESSION: 1. 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 3. Moderate stenosis of the common femoral arteries bilaterally, worse on the left. 4. Occluded left superficial femoral artery and popliteal arteries. 5. No opacification of the left lower leg arteries. 6. Proximal occlusion of superior mesenteric artery with reconstitution of flow 3.5 cm from the origin. 7. Fluid collections within the rectus sheath bilaterally measuring 9.2 x 2.8 x 1.9 cm on the right and 8.9 x 2.4 x 2.1 cm on the left may relate to seromas, hematomas, or abscesses. 8. Distal colonic diverticulosis. Electronically Signed   By: Miachel Roux M.D.   On: 01/17/2023 15:00    Pending Labs Unresulted Labs (From admission, onward)      Start     Ordered   01/18/23 0500  Heparin level (unfractionated)  Daily,   R      01/17/23 1325   01/18/23 0500  CBC  Daily,   R      01/17/23 1325   01/17/23 2130  Heparin level (unfractionated)  Once-Timed,   URGENT        01/17/23 1325            Vitals/Pain Today's Vitals   01/17/23 1203 01/17/23 1300 01/17/23 1410 01/17/23 1451  BP:  (!) 123/53    Pulse:  92    Resp:  (!) 23    Temp:      TempSrc:      SpO2:  100%    Weight: 74.8 kg     Height: 5' 11"$  (1.803 m)     PainSc: 10-Worst pain ever  8  9     Isolation Precautions No active isolations  Medications Medications  heparin ADULT infusion 100 units/mL (25000 units/269m) (1,200 Units/hr Intravenous New Bag/Given 01/17/23 1350)  HYDROmorphone (DILAUDID) injection 0.5 mg (0.5 mg Intravenous Given 01/17/23 1425)  iohexol (OMNIPAQUE) 350 MG/ML injection 100 mL (100 mLs Intravenous Contrast Given 01/17/23 1243)  oxyCODONE-acetaminophen (PERCOCET/ROXICET) 5-325 MG per tablet 1 tablet (1 tablet Oral Given 01/17/23 1314)  heparin bolus via infusion 5,000 Units (5,000 Units Intravenous Bolus from Bag 01/17/23 1350)    Mobility Usually walks with cane/walker but currently nonambulatory d/t pain in L leg     Focused Assessments Peripheral vascular, skin   R Recommendations: See Admitting Provider Note  Report given to:   Additional Notes:

## 2023-01-17 NOTE — Consult Note (Addendum)
Hospital Consult    Reason for Consult: Ischemic left leg Referring Physician: ED MRN #:  [id number]  History of Present Illness: This is a 89 y.o. male with history of coronary artery disease including NSTEMI, COPD, hypertension, hyperlipidemia, prior DVT requiring IVC filter, chronic hyponatremia that vascular surgery has been consulted for ischemic left leg.  Patient was recently discharged with acute hypoxic respiratory failure and pneumonia on 01/12/2023 from [facility].  Patient states he developed sudden onset numbness and pain in his left foot and inability to move his foot this morning.  He reports decreased motor and sensation in his left foot. He is at a assisted living facility.  Reports he was walking with a walker.  On arrival here he is on oxygen in the ED with poor pulmonary toilet.  He is in acute renal failure with a creatinine of 2.37.  In review of the records he is DNR/DNI after recent discharge.  Past Medical History:  Diagnosis Date   Bilateral hearing loss    COPD (chronic obstructive pulmonary disease) (HCC)    DVT (deep venous thrombosis) (HCC)    GERD (gastroesophageal reflux disease)    History of pulmonary embolus (PE)    HLD (hyperlipidemia)    HTN (hypertension)    Melanoma (HCC)    NSTEMI (non-ST elevated myocardial infarction) (HCC)    OSA (obstructive sleep apnea)    Peripheral neuropathy    Squamous cell skin cancer     Past Surgical History:  Procedure Laterality Date   BIOPSY  11/07/2021   Procedure: BIOPSY;  Surgeon: Doran Stabler, MD;  Location: [facility];  Service: Gastroenterology;;   ESOPHAGOGASTRODUODENOSCOPY (EGD) WITH PROPOFOL N/A 11/07/2021   Procedure: ESOPHAGOGASTRODUODENOSCOPY (EGD) WITH PROPOFOL;  Surgeon: Doran Stabler, MD;  Location: [place];  Service: Gastroenterology;  Laterality: N/A;   PERCUTANEOUS CORONARY STENT INTERVENTION (PCI-S)     RCA    Allergies  Allergen Reactions   Ticagrelor Other (See Comments)     "GI Hemorrhage"   Tetanus Toxoids Other (See Comments)    "Horse serum only"    Coffea Arabica     Other reaction(s): Other (See Comments) DECAF coffee causes vision changes per pt. DECAF coffee causes vision changes per pt.    Niacin     Other reaction(s): Flushing (disorder), Other (See Comments) [facility] but [facility] can not remember exact reaction [facility] but patient can not remember exact reaction    Niacin And Related Other (See Comments)    Flushing   Niacin Er Other (See Comments)    Flushing   Simvastatin Other (See Comments)    "Muscle pain" Other reaction(s): Muscle pain Other reaction(s): Muscle pain (finding), Muscle pain (finding), Muscle pain (finding), Muscle pain (finding), Other (See Comments)   Tiotropium     Other reaction(s): Retention of urine    Prior to Admission medications   Medication Sig Start Date End Date Taking? Authorizing Provider  acetaminophen (TYLENOL) 500 MG tablet Take 500 mg by mouth every 8 (eight) hours as needed for mild pain, headache or fever.    [provider]  Aclidinium Bromide (TUDORZA PRESSAIR) 400 MCG/ACT AEPB Inhale 1 puff into the lungs in the morning and at bedtime. 01/12/23   Gerrit Heck, MD  albuterol (VENTOLIN HFA) 108 (90 Base) MCG/ACT inhaler Inhale 2 puffs into the lungs every 6 (six) hours as needed for wheezing or shortness of breath.    [provider]  amoxicillin-clavulanate (AUGMENTIN) 875-125 MG  tablet Take 1 tablet by mouth every 12 (twelve) hours. 01/12/23   Gerrit Heck, MD  aspirin EC 81 MG tablet Take 81 mg by mouth daily. Swallow whole.    [provider]  atorvastatin (LIPITOR) 20 MG tablet Take 20 mg by mouth every evening.    [provider]  Brinzolamide-Brimonidine (SIMBRINZA) 1-0.2 % SUSP Place 1 drop into both eyes in the morning and at bedtime.    [provider]  Calcium Carb-Cholecalciferol (CALCIUM 500/VITAMIN D PO) Take 1 tablet by  mouth daily.    [provider]  carboxymethylcellulose (REFRESH PLUS) 0.5 % SOLN Place 1 drop into both eyes in the morning, at noon, in the evening, and at bedtime.    [provider]  Cholecalciferol (VITAMIN D3) 25 MCG (1000 UT) CAPS Take 1,000 Units by mouth daily.    [provider]  docusate sodium (COLACE) 100 MG capsule Take 100 mg by mouth 2 (two) times daily.    [provider]  ferrous sulfate 325 (65 FE) MG tablet Take 325 mg by mouth every Monday, Wednesday, and Friday.    [provider]  fluticasone-salmeterol (WIXELA INHUB) 250-50 MCG/ACT AEPB Inhale 1 puff into the lungs in the morning and at bedtime.    [provider]  furosemide (LASIX) 20 MG tablet Take 20 mg by mouth daily as needed for fluid.    [provider]  gabapentin (NEURONTIN) 300 MG capsule Take 1 capsule (300 mg total) by mouth 2 (two) times daily. 01/12/23   Gerrit Heck, MD  guaiFENesin (MUCINEX) 600 MG 12 hr tablet Take 600 mg by mouth every 12 (twelve) hours as needed for cough.    [provider]  ipratropium-albuterol (DUONEB) 0.5-2.5 (3) MG/3ML SOLN Take 3 mLs by nebulization every 6 (six) hours.    [provider]  lanolin/mineral oil (KERI/THERA-DERM) LOTN Apply 1 Application topically daily as needed for dry skin.    [provider]  latanoprost (XALATAN) 0.005 % ophthalmic solution Place 1 drop into both eyes at bedtime.    [provider]  lisinopril (ZESTRIL) 10 MG tablet Take 10 mg by mouth daily.    [provider]  LUTEIN PO Take 1 capsule by mouth daily.    [provider]  Multiple Vitamin (MULTIVITAMIN) tablet Take 1 tablet by mouth daily.    [provider]  Neomycin-Bacitracin-Polymyxin (TRIPLE ANTIBIOTIC) OINT Place 1 application onto the skin 2 (two) times daily as needed (minor wounds/cuts).    [provider]  nystatin (MYCOSTATIN/NYSTOP) powder Apply 1  Application topically daily as needed (For groin).    [provider]  pantoprazole (PROTONIX) 40 MG tablet Take 1 tablet (40 mg total) by mouth 2 (two) times daily before a meal. 11/08/21   Virl Axe, MD  polyethylene glycol (MIRALAX / GLYCOLAX) 17 g packet Take 17 g by mouth daily as needed for mild constipation.    [provider]  predniSONE (DELTASONE) 20 MG tablet Take 2 tablets (40 mg total) by mouth daily with breakfast. 01/13/23   Gerrit Heck, MD  Probiotic Product (RISA-BID PROBIOTIC PO) Take 1 capsule by mouth daily.    [provider]  senna (SENOKOT) 8.6 MG TABS tablet Take 2 tablets by mouth daily as needed for mild constipation.    [provider]  sodium chloride 1 g tablet Take 1 tablet (1 g total) by mouth 3 (three) times daily with meals. 11/08/21   Virl Axe, MD  sucralfate ([place])  1 GM/10ML suspension Take 10 mLs (1 g total) by mouth 4 (four) times daily -  with meals and at bedtime. Patient not taking: Reported on 01/10/2023 11/08/21   Virl Axe, MD  tamsulosin (FLOMAX) 0.4 MG CAPS capsule Take 0.4 mg by mouth at bedtime. Hold for SBP <110    [provider]  traZODone (DESYREL) 50 MG tablet Take 0.5 tablets (25 mg total) by mouth at bedtime. 01/12/23   Gerrit Heck, MD  vitamin C (ASCORBIC ACID) 500 MG tablet Take 500 mg by mouth daily.    [provider]  zinc oxide 20 % ointment Apply 1 Application topically daily as needed (for buttocks).    [provider]    Social History   Socioeconomic History   Marital status: Widowed    Spouse name: Not on file   Number of children: Not on file   Years of education: Not on file   Highest education level: Not on file  Occupational History   Not on file  Tobacco Use   Smoking status: Former    Types: Cigarettes    Quit date: 1980    Years since quitting: 44.1   Smokeless tobacco: Never  Substance and Sexual Activity   Alcohol use: Not  Currently   Drug use: Never   Sexual activity: Not on file    Comment: -  Other Topics Concern   Not on file  Social History Narrative   Not on file   Social Determinants of Health   Financial Resource Strain: Not on file  Food Insecurity: No Food Insecurity (01/08/2023)   Hunger Vital Sign    Worried About Running Out of Food in the Last Year: Never true    Ran Out of Food in the Last Year: Never true  Transportation Needs: No Transportation Needs (01/08/2023)   PRAPARE - Hydrologist (Medical): No    Lack of Transportation (Non-Medical): No  Physical Activity: Not on file  Stress: Not on file  Social Connections: Not on file  Intimate Partner Violence: Not At Risk (01/08/2023)   Humiliation, Afraid, Rape, and Kick questionnaire    Fear of Current or Ex-Partner: No    Emotionally Abused: No    Physically Abused: No    Sexually Abused: No     Family History  Problem Relation Age of Onset   Cancer Mother     ROS: [x]$  Positive   [ ]$  Negative   [ ]$  All sytems reviewed and are negative  Cardiovascular: []$  chest pain/pressure []$  palpitations []$  SOB lying flat []$  DOE []$  pain in legs while walking []$  pain in legs at rest []$  pain in legs at night []$  non-healing ulcers []$  hx of DVT []$  swelling in legs  Pulmonary: []$  productive cough []$  asthma/wheezing []$  home O2  Neurologic: []$  weakness in []$  arms []$  legs []$  numbness in []$  arms []$  legs []$  hx of CVA []$  mini stroke []$ difficulty speaking or slurred speech []$  temporary loss of vision in one eye []$  dizziness  Hematologic: []$  hx of cancer []$  bleeding problems []$  problems with blood clotting easily  Endocrine:   []$  diabetes []$  thyroid disease  GI []$  vomiting blood []$  blood in stool  GU: []$  CKD/renal failure []$  HD--[]$  M/W/F or []$  T/T/S []$  burning with urination []$  blood in urine  Psychiatric: []$  anxiety []$  depression  Musculoskeletal: []$  arthritis []$  joint  pain  Integumentary: []$  rashes []$  ulcers  Constitutional: []$  fever []$   chills   Physical Examination  Vitals:   01/17/23 1149 01/17/23 1300  BP: (!) 103/44 (!) 123/53  Pulse: 86 92  Resp: (!) 21 (!) 23  Temp: 97.8 F (36.6 C)   SpO2: 100% 100%   Body mass index is 23.01 kg/m.  General:  Frail appearing, on nasal cannula Gait: Not observed HENT: WNL, normocephalic Pulmonary: normal non-labored breathing Cardiac: regular, without  Murmurs, rubs or gallops Abdomen:  soft, NT/ND Vascular Exam/Pulses: Unable to palpate femoral pulses Left foot mottled with decreased sensation and limited motor Right foot motor or sensory intact Extremities: with ischemic changes left foot Musculoskeletal: no muscle wasting or atrophy  Neurologic: A&O X 3; Appropriate Affect ; SENSATION: normal; MOTOR FUNCTION:  moving all extremities equally except the left foot.   CBC    Component Value Date/Time   WBC 17.7 (H) 01/17/2023 1213   RBC 3.76 (L) 01/17/2023 1213   HGB 11.8 (L) 01/17/2023 1213   HCT 35.3 (L) 01/17/2023 1213   PLT 319 01/17/2023 1213   MCV 93.9 01/17/2023 1213   MCH 31.4 01/17/2023 1213   MCHC 33.4 01/17/2023 1213   RDW 13.9 01/17/2023 1213   LYMPHSABS 1.7 01/17/2023 1213   MONOABS 1.0 01/17/2023 1213   EOSABS 0.1 01/17/2023 1213   BASOSABS 0.0 01/17/2023 1213    BMET    Component Value Date/Time   NA 130 (L) 01/17/2023 1213   K 4.9 01/17/2023 1213   CL 99 01/17/2023 1213   CO2 19 (L) 01/17/2023 1213   GLUCOSE 117 (H) 01/17/2023 1213   BUN 68 (H) 01/17/2023 1213   CREATININE 2.37 (H) 01/17/2023 1213   CALCIUM 8.4 (L) 01/17/2023 1213   GFRNONAA 26 (L) 01/17/2023 1213    COAGS: Lab Results  Component Value Date   INR 1.3 (H) 01/17/2023   INR 2.7 (H) 11/17/2021   INR 2.1 (H) 11/16/2021     Non-Invasive Vascular Imaging:    CTA reviewed with chronically occluded infrarenal aorta and both iliac arteries.  He does reconstitute bilateral common femorals  and profundus through epigastric collaterals.  There does appear to be subacute thrombus in the left SFA although this is diffusely diseased, no flow in left lower extremity.  .   ASSESSMENT/PLAN: This is a 88 y.o. male  with history of coronary artery disease including NSTEMI, COPD, hypertension, hyperlipidemia, prior DVT requiring IVC filter, chronic hyponatremia that vascular surgery has been consulted for ischemic left leg.  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  Not a candidate for stent grafting given extent of occlusive disease.  He is not a candidate for open aortic surgery given his age and severe deconditioning.  I discussed with his family the option of an ax bifemoral bypass as his left lower leg event is certainly limb threatening with loss of motor and sensation.  I think he is exceedingly high risk for surgery as he has very poor pulmonary toilet in the ED and was just discharged to nursing facility with pneumonia and hypoxic respiratory failure.  Also has acute renal failure with a creatinine of 2.37.  He is DNR/DNI.  He is malnourished.  He also has fluid collections in his abdominal wall that would put the graft at high risk for infection.    I discussed he would be very high risk for prolonged intubation postoperatively with risk of recurrent pneumonia ultimately requiring trach and feeding tube possible dialysis ultimately leading to his death.  I think a palliative care consult will be very appropriate.  I also discussed the option of a left above-knee amputation for palliative care.  The family is coming to talk with the patient from [place].  I discussed some risk of amputation not healing but his profunda is patent through collaterals and thigh warm.  They state he previously indicated he would not want intubation or prolonged life support.  I will follow-up with family after they arrive.    [name], MD [facility] Office: [place]

## 2023-01-17 NOTE — Assessment & Plan Note (Addendum)
Continues to be improved while on anticoagulations.  - Vascular surgery following, appreciate recommendations - Fall precautions - Tylenol prn - Continue eliquis

## 2023-01-17 NOTE — Progress Notes (Addendum)
[place] for heparin  Indication:  VTE  Allergies  Allergen Reactions   Ticagrelor Other (See Comments)    "GI Hemorrhage"   Tetanus Toxoids Other (See Comments)    "Horse serum only"    Coffea Arabica     Other reaction(s): Other (See Comments) DECAF coffee causes vision changes per pt. DECAF coffee causes vision changes per pt.    Niacin     Other reaction(s): Flushing (disorder), Other (See Comments) [facility] but [facility] can not remember exact reaction [facility] but patient can not remember exact reaction    Niacin And Related Other (See Comments)    Flushing   Niacin Er Other (See Comments)    Flushing   Simvastatin Other (See Comments)    "Muscle pain" Other reaction(s): Muscle pain Other reaction(s): Muscle pain (finding), Muscle pain (finding), Muscle pain (finding), Muscle pain (finding), Other (See Comments)   Tiotropium     Other reaction(s): Retention of urine    Patient Measurements: Height: 5' 11"$  (180.3 cm) Weight: 74.8 kg (165 lb) IBW/kg (Calculated) : 75.3 Heparin Dosing Weight: 74.8 kg  Vital Signs: Temp: 97.8 F (36.6 C) (02/14 1149) Temp Source: Oral (02/14 1149) BP: 103/44 (02/14 1149) Pulse Rate: 86 (02/14 1149)  Labs: Recent Labs    01/17/23 1213  APTT 32  LABPROT 15.7*  INR 1.3*    Estimated Creatinine Clearance: 56.8 mL/min (by C-G formula based on SCr of 0.97 mg/dL).   Medical History: Past Medical History:  Diagnosis Date   Bilateral hearing loss    COPD (chronic obstructive pulmonary disease) (HCC)    DVT (deep venous thrombosis) (HCC)    GERD (gastroesophageal reflux disease)    History of pulmonary embolus (PE)    HLD (hyperlipidemia)    HTN (hypertension)    Melanoma (HCC)    NSTEMI (non-ST elevated myocardial infarction) (HCC)    OSA (obstructive sleep apnea)    Peripheral neuropathy    Squamous cell skin cancer     Assessment: 70 YOM with VTE. No anticoagulant  PTA. Hgb 11.8, plt 319. Pharmacy consulted to dose IV heparin.   Goal of Therapy:  Heparin level 0.3-0.7 units/ml Monitor platelets by anticoagulation protocol: Yes   Plan:  Heparin 5,000 units x 1, then 1,200 units/hr Obtain 8 hour heparin level Daily heparin level and CBC while on heparin Monitor s/sx bleeding  Eliseo Gum, PharmD PGY1 Pharmacy Resident   01/17/2023  1:13 PM

## 2023-01-17 NOTE — ED Notes (Signed)
Pt taken to and from CT by RN on monitor

## 2023-01-17 NOTE — H&P (Signed)
Hospital Admission History and Physical Service Pager: [phone]  Patient name: Marcus Martin Medical record number: [id number] Date of Birth: Nov 07, 1933 Age: 89 y.o. Gender: male  Primary Care Provider: Corrington, Delsa Grana, MD Consultants: Vascular Surgery  Code Status: DNR/DNI Preferred Emergency Contact: Marcus Martin   Chief Complaint: Left Leg Pain   Assessment and Plan: Khylin Kittler is a 89 y.o. male presenting with left leg pain concerning for critical ischemia. Differential for this patient's presentation of this includes critical limb ischemia, DVT, compartment syndrome.   * Lower limb ischemia Left lower extremity with mottled appearing foot, decrease sensation, coolness and limited motor and unable to palpate DP or PT pulses.  Toes with purple ischemic changes.  CT angio of femorals showed infrarenal abdominal aortic occlusion, bilateral external iliac artery occlusion, and occluded left superficial femoral artery and popliteal arteries.  Does appear to have a fluid collection within the rectus sheath as well.  Vascular surgery considering bypass surgery although he is high risk for this given fluid collections and abdominal wall, palliative amputation was discussed as well. -Admit to [facility], MedSurg, attending Dr. Thompson Grayer -Vascular surgery following, appreciate recommendations -Fall precautions -Dilaudid 0.5 mg every 4 hours as needed -Heparin gtt -NPO pending possible intervention  AKI (acute kidney injury) ([place]) Creatinine elevated to 2.37 today.  Baseline around 0.9-1.  BUN 68.  Patient did have mildly dry mucous membranes, could consider prerenal cause.  Will obtain bladder scan given patient has Flomax on his medication list at home and history of BPH. -Obtain bladder scan -Restart home flomax  -Trend on a.m. BMP -Strict ins and outs -Consider fluid resuscitation after bladder scan  Community acquired pneumonia Recently admitted from 01/08/2023 to 01/12/2023 for COPD  exacerbation as well as right upper lobe cavitary pneumonia versus mass.  Was treated in the hospital with 2 days of IV cefepime, unasyn and azithromycin for 3 days. Was transitioned to Augmentin course on 01/11/2023 (875-125 mg) BID. Last dose was this morning.  Patient was put on 2 L in ED however upon talking to SNF they have been weaning and his saturations have been within 88 to 92% on room air.  Patient says that his respiratory status is better and he has not been coughing as much or having shortness of breath. -Augmentin 500-125 twice daily (dose adjusted for renal function per pharmacy) -Keep O2 sats 88 to 92% -Monitor fever curve   Chronic conditions: NSTEMI s/p PCI-home aspirin COPD-continue home albuterol, Turdoza, Wyxela > using formulary alternatives Hypertension-hold home lisinopril HLD-continue atorvastatin 20 mg daily Atrial fibrillation-does not appear to be on any rate controlling medications, monitor rates while in the hospital.  Was NSR on exam.  Previously on Eliquis however per SNF has had a history of GI bleed and was taken off of this. Recurrent DVT/PE s/p IVC filter placement GERD-continue home Protonix Peripheral neuropathy-continue home gabapentin BPH-continue home Flomax Melanoma Bilateral hearing loss Anemia-Continue home ferrous sulfate Dry eyes-continue home eye drops Hold home 20 mg furosemide-this is PRN for leg swelling  FEN/GI: NPO VTE Prophylaxis: Heparin gtt  Disposition: Med-surg  History of Present Illness:  Marcus Martin is a 89 y.o. male presenting with acute left lower leg pain.  Patient presented from [facility] with excruciating left leg pain that awoke him from sleep around 7-8 AM per NP.  He has been having numbness and coolness to touch.  He has had discoloration of his toes to a purplish color as well.   When talking  to son, the NP came in to follow up with PNA last week and observed state of left leg and told him to go to  hospital.   Of note patient was recently admitted from 01/08/2023 to 01/12/2023 for COPD exacerbation as well as right upper lobe cavitary pneumonia which he was discharged on and Augmentin course for.  He feels like his breathing has been much better  In the ED, unable to palpate a pulse or find a pulse on bedside doppler.  Saturating at 100% on 2L oxygen (does not require at baseline and was weaned to room air), afebrile, rates in 80s-90s. Na 130, WBC 17.7, Cr 2.37 (b/l 0.9-1), LA 1.1, hgb 11.8, CT Angio bifemoral obtained-awaiting read, LE dopplers ordered, Vascular surgery consulted who will  Review Of Systems: Per HPI with the following additions:   Pertinent Past Medical History: SVT NSTEMI s/p PCI COPD Hypertension HLD Atrial fibrillation Recurrent DVT/PE s/p IVC filter placement GERD Peripheral neuropathy Melanoma Bilateral hearing loss Remainder reviewed in history tab.   Pertinent Past Surgical History: IVC filter PCI of RCA 2016 Remainder reviewed in history tab.   Pertinent Social History: Tobacco use: Former-smoked from  [age] years old Alcohol use: Unsure when the last time he drank was Other Substance use: None Lives [facility]  Pertinent Family History:  Remainder reviewed in history tab.   Important Outpatient Medications: Tylenol 1000 q8h prn Albuterol prn 2 puffs q6h prn Augmentin 01/13/2023 - 2/16 end date 10 day course-got a dose today Vitamin C 500 daily ASA 81 daily Atorvastatin 20 in evening Calcium 500 daily Stool softener BID Ferrous sulfate 325 MWF Flomax 0.4 mg qhs (BP parameter) Furosemide 20 mg prn if gaining 3 pounds overnight or increased SOB or leg swelling Gabapentin 300 mg BID Latanoprost drops qhs Lisinopril 5 mg (hold for SBP <100) Lutein 10 mg daily Miralax prn Mucinex 600 q12h Protonix every morning 40 mg  Refresh tears 4 times  Relax-M qhs Acidophilus Senna prn Symbreza eye drops Sodium chloride 1 gram TID  (hyponatremia since 1 year ago) Theratears Trazadone 25 mg at bedtime Terdoza 400 mcg powder inhaler BID Vitamin B Complex 1 daily Vitamin D 23 25 mcg daily Wyxela inhaler BID  Eliquis-taken off due to stomach bleed awhile ago per SNF Remainder reviewed in medication history.  Remainder reviewed in medication history.   Objective: BP 104/67   Pulse 88   Temp 97.9 F (36.6 C) (Oral)   Resp 18   Ht 5' 11"$  (1.803 m)   Wt 74.8 kg   SpO2 95%   BMI 23.01 kg/m  Exam: General: Moderately distressed, alert and responsive to questions although very hard of hearing Eyes: Pupils equal and reactive bilaterally, conjunctive a normal ENTM: Mildly dry mucous membranes, chapped lips Neck: ROM intact Cardiovascular: Regular rate and rhythm, no murmurs rubs or gallops Respiratory: Mild expiratory wheezes, no increased work of breathing on room air, does have productive sounding cough still Gastrointestinal: Soft, nontender to palpation, nondistended MSK: Left lower extremity with mottled appearance, purple toes on left lower extremity, unable to palpate pulses DP or PT on left side decreased sensation on left leg with coolness to touch on left foot, good pulses on right lower extremity and right lower extremity well-perfused, 2+ pitting edema to knees bilaterally Derm: Mottled appearance of left lower extremity Neuro: No focal deficits except difficulty with motor on left foot due to pain Psych: Mildly distressed due to pain  Labs:  CBC BMET  Recent  Labs  Lab 01/17/23 1213  WBC 17.7*  HGB 11.8*  HCT 35.3*  PLT 319   Recent Labs  Lab 01/17/23 1213  NA 130*  K 4.9  CL 99  CO2 19*  BUN 68*  CREATININE 2.37*  GLUCOSE 117*  CALCIUM 8.4*    Pertinent additional labs.  EKG: sinus rhythm, slightly prolonged PR interval   Imaging Studies Performed:  CT ANGIO AO+BIFEM W & OR WO CONTRAST  Result Date: 01/17/2023 CLINICAL DATA:  Peripheral arterial disease EXAM: CT ANGIOGRAPHY OF  ABDOMINAL AORTA WITH ILIOFEMORAL RUNOFF TECHNIQUE: Multidetector CT imaging of the abdomen, pelvis and lower extremities was performed using the standard protocol during bolus administration of intravenous contrast. Multiplanar CT image reconstructions and MIPs were obtained to evaluate the vascular anatomy. RADIATION DOSE REDUCTION: This exam was performed according to the departmental dose-optimization program which includes automated exposure control, adjustment of the mA and/or kV according to patient size and/or use of iterative reconstruction technique. CONTRAST:  133m OMNIPAQUE IOHEXOL 350 MG/ML SOLN COMPARISON:  CT chest abdomen pelvis 11/16/2021 FINDINGS: VASCULAR Aorta: Calcified atheromatous plaque seen throughout the suprarenal abdominal aorta without aneurysm or significant stenosis. The infrarenal abdominal aorta is occluded. Celiac: Mild narrowing at the origin of the celiac artery due to calcified plaque. Left gastric artery originates directly from the aorta. No significant abnormality of the splenic or common hepatic arteries. 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 There is reopacification of the superior mesenteric artery 3.5 cm from the origin consistent with retrograde collateral flow. Renals: Complete to near complete occlusion at the origin of the right main renal artery due to calcified plaque. Moderate stenosis at the origin of the left main renal artery. IMA: Occluded at the origin. Opacification of distal branches consistent with retrograde collateral flow. RIGHT Lower Extremity Inflow: Right common and external iliac arteries are occluded. Proximal internal iliac artery is occluded. Opacification of distal branches consistent with retrograde collateral flow. Outflow: Flow reconstitutes within the right common femoral artery. Moderate stenosis of the right common femoral artery due to calcified plaque, best seen on image 212 of series 5. Near complete occlusion at the  origin of the profunda femoris artery due to calcified plaque. The remainder of the profunda femoris artery is patent. The superficial femoral artery is diffusely calcified but patent without focal high-grade stenosis. Popliteal artery is diminutive but patent. Runoff: Three-vessel runoff to the right ankle. LEFT Lower Extremity Inflow: Left common and external iliac arteries are occluded. Internal iliac artery occluded proximally. Opacification of distal branches consistent with retrograde collateral flow. Outflow: Moderate narrowing of the left common femoral artery due to calcified plaque. Profunda femoris artery is patent without significant stenosis. Left superficial femoral artery is occluded, beginning proximally 9 cm from the origin. Left popliteal artery is occluded. Runoff: No significant opacification of the left lower leg arteries. Veins: No obvious venous abnormality within the limitations of this arterial phase study. Review of the MIP images confirms the above findings. NON-VASCULAR Lower chest: No acute abnormality. Hepatobiliary: Subcentimeter hypodensity at the dome of the right liver lobe is too small to fully characterize. Mild cholelithiasis. No bile duct dilatation. Pancreas: Unremarkable. No pancreatic ductal dilatation or surrounding inflammatory changes. Spleen: Normal in size without focal abnormality. Adrenals/Urinary Tract: Adrenal glands are normal. Atrophy of the lower pole the right kidney may be due to prior infarct or inflammation. Two small simple cysts at the upper pole of the left kidney measuring up to 2.1 cm do not require  further imaging follow-up. No significant abnormality of the bladder or ureters. Stomach/Bowel: Small hiatal hernia. No bowel dilatation to indicate ileus or obstruction. Descending and sigmoid colon diverticulosis without evidence of acute diverticulitis. Normal appendix. Lymphatic: No enlarged abdominal or pelvic lymph nodes. Reproductive: Moderately  enlarged prostate. Other: Diffuse subcutaneous edema of the calves bilaterally. No ascites. Mild rectus sheath diastasis. Fluid collections noted within the rectus sheath bilaterally measuring 9.2 x 2.8 x 1.9 cm (image 79, series 9) on the right and 8.9 x 2.4 x 2.1 cm on the left. (Image 86, series 9. These collections are new compared to the prior examinations and may relate to seromas, hematomas, or abscesses. Musculoskeletal: Bilateral rib deformities consistent with remote healed fractures. Unchanged moderate compression fracture of L3 vertebral body. No new compression fractures are identified. IMPRESSION: 1. 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 3. Moderate stenosis of the common femoral arteries bilaterally, worse on the left. 4. Occluded left superficial femoral artery and popliteal arteries. 5. No opacification of the left lower leg arteries. 6. Proximal occlusion of superior mesenteric artery with reconstitution of flow 3.5 cm from the origin. 7. Fluid collections within the rectus sheath bilaterally measuring 9.2 x 2.8 x 1.9 cm on the right and 8.9 x 2.4 x 2.1 cm on the left may relate to seromas, hematomas, or abscesses. 8. Distal colonic diverticulosis. Electronically Signed   By: Miachel Roux M.D.   On: 01/17/2023 15:00     My Interpretation: CT angio of femorals showed infrarenal abdominal aortic occlusion, bilateral external iliac artery occlusion, and occluded left superficial femoral artery and popliteal arteries.  Does appear to have a fluid collection within the rectus sheath as well   Gerrit Heck, MD 01/17/2023, 4:09 PM PGY-2, [facility] pager: [phone], text pages welcome Secure chat group [place]

## 2023-01-17 NOTE — ED Triage Notes (Signed)
Pt brought in by EMS from [facility] after waking up and having L leg pain. Pt's leg is numb, cold to touch, no palpable pulse and no pulse found with doppler. Provider notified. Pt recently here with pneumonia and wears 2L [place] chronically

## 2023-01-17 NOTE — ED Provider Notes (Signed)
 [place] [facility]ERGENC[facility] DEPARTMENT AT [facility] Provider Note   CSN: [id number] Arrival date & time: 01/17/23  1138     History  Chief Complaint  Patient presents with   Leg Pain    Marcus Martin is a 89 y.o. male.  Patient presents the emergency department with left leg pain.  Patient has past medical history significant for aortic atherosclerosis, COPD.  Patient was brought in by EMS reports that symptoms began earlier this morning when he woke up.  He reports that his left leg is painful and he is also experiencing some associated numbness and is cool to touch.  EMS and nursing that they were not able to palpate a pulse or find a pulse on Doppler.  Patient was recently admitted to the hospital several days ago for acute hypoxia and respiratory failure in the setting of suspected pneumonia.  Patient wears 2 L via nasal cannula chronically and is satting well at 100% on initial assessment.  Patient denies any chest pain, shortness of breath, abdominal pain, dysuria, hematuria, vomiting, diarrhea.  States that he still feels like lung function has not returned to baseline but has somewhat improved since being discharged from the inpatient.    Leg Pain Associated symptoms: no fever       Home Medications Prior to Admission medications   Medication Sig Start Date End Date Taking? Authorizing Provider  acetaminophen (TYLENOL) 500 MG tablet Take 1,000 mg by mouth every 8 (eight) hours as needed for fever.   Yes [provider]  Aclidinium Bromide (TUDORZA PRESSAIR) 400 MCG/ACT AEPB Inhale 1 puff into the lungs in the morning and at bedtime. 01/12/23  Yes Levin Erp, MD  albuterol (VENTOLIN HFA) 108 (90 Base) MCG/ACT inhaler Inhale 2 puffs into the lungs every 6 (six) hours as needed for wheezing or shortness of breath.   Yes [provider]  amoxicillin-clavulanate (AUGMENTIN) 875-125 MG tablet Take 1 tablet by mouth every 12 (twelve) hours. Patient taking  differently: Take 1 tablet by mouth daily. 10 day course. 01/12/23  Yes Levin Erp, MD  ascorbic acid (VITAMIN C) 500 MG tablet Take 500 mg by mouth daily.   Yes [provider]  aspirin EC 81 MG tablet Take 81 mg by mouth daily.   Yes [provider]  atorvastatin (LIPITOR) 20 MG tablet Take 20 mg by mouth every evening.   Yes [provider]  Brinzolamide-Brimonidine (SIMBRINZA) 1-0.2 % SUSP Place 1 drop into both eyes 2 (two) times daily.   Yes [provider]  Calcium Carb-Cholecalciferol (CALCIUM 500 +D) 500-10 MG-MCG TABS Take 1 tablet by mouth daily.   Yes [provider]  carboxymethylcellulose (REFRESH TEARS) 0.5 % SOLN Place 1 drop into both eyes 4 (four) times daily.   Yes [provider]  cholecalciferol 25 MCG (1000 UT) tablet Take 1,000 Units by mouth daily.   Yes [provider]  docusate sodium (COLACE) 100 MG capsule Take 100 mg by mouth 2 (two) times daily.   Yes [provider]  ferrous sulfate 325 (65 FE) MG EC tablet Take 325 mg by mouth every Monday, Wednesday, and Friday.   Yes [provider]  fluticasone-salmeterol (WIXELA INHUB) 250-50 MCG/ACT AEPB Inhale 1 puff into the lungs in the morning and at bedtime.   Yes [provider]  furosemide (LASIX) 20 MG tablet Take 20 mg by mouth See admin instructions. 2 entries on MAR: 20 mg once a morning for 3 days (01/16/23-01/18/23) + 20  mg once daily as needed for signs of volume overload (gain of 3 lbs over night or 5 lbs in 1 week, increased shortness of breath and/or increased leg swelling)   Yes [provider]  gabapentin (NEURONTIN) 300 MG capsule Take 1 capsule (300 mg total) by mouth 2 (two) times daily. 01/12/23  Yes Levin Erp, MD  guaiFENesin (MUCINEX) 600 MG 12 hr tablet Take 600 mg by mouth every 12 (twelve) hours.   Yes [provider]  ipratropium-albuterol (DUONEB) 0.5-2.5 (3) MG/3ML SOLN Take 3 mLs by  nebulization every 6 (six) hours.   Yes [provider]  latanoprost (XALATAN) 0.005 % ophthalmic solution Place 1 drop into both eyes at bedtime.   Yes [provider]  lisinopril (ZESTRIL) 10 MG tablet Take 5 mg by mouth See admin instructions. 5 mg once daily. HOLD for SBP < 100.   Yes [provider]  Lutein 10 MG TABS Take 10 mg by mouth daily.   Yes [provider]  Multiple Vitamin (MULTIVITAMIN) tablet Take 1 tablet by mouth daily. Multivitamin + Folic acid 400 mcg.   Yes [provider]  Neomycin-Bacitracin-Polymyxin (TRIPLE ANTIBIOTIC) OINT Apply 1 Application topically 2 (two) times daily as needed (minor wounds/cuts).   Yes [provider]  nystatin powder Apply 1 Application topically 2 (two) times daily as needed (irritation). To groin area   Yes [provider]  pantoprazole (PROTONIX) 40 MG tablet Take 1 tablet (40 mg total) by mouth 2 (two) times daily before a meal. Patient taking differently: Take 40 mg by mouth 2 (two) times daily. 11/08/21  Yes Merrilyn Puma, MD  Probiotic Product (RISA-BID PROBIOTIC) TABS Take 1 tablet by mouth daily.   Yes [provider]  senna (SENOKOT) 8.6 MG TABS tablet Take 17.2 mg by mouth daily as needed (constipation).   Yes [provider]  Skin Protectants, Misc. (MINERIN CREME) CREA Apply 1 Application topically daily. To legs and back.   Yes [provider]  sodium chloride 1 g tablet Take 1 tablet (1 g total) by mouth 3 (three) times daily with meals. 11/08/21  Yes Merrilyn Puma, MD  tamsulosin (FLOMAX) 0.4 MG CAPS capsule Take 0.4 mg by mouth See admin instructions. 0.4 mg once daily at bedtime. HOLD for SBP < 110.   Yes [provider]  traZODone (DESYREL) 50 MG tablet Take 0.5 tablets (25 mg total) by mouth at bedtime. 01/12/23  Yes Levin Erp, MD  zinc oxide 20 % ointment Apply 1 Application topically as needed for irritation. To buttocks.    Yes [provider]  predniSONE (DELTASONE) 20 MG tablet Take 2 tablets (40 mg total) by mouth daily with breakfast. Patient not taking: Reported on 01/17/2023 01/13/23   Levin Erp, MD      Allergies    Brilinta [ticagrelor], Coffea arabica, Tetanus toxoids, Zocor [simvastatin], Spiriva respimat [tiotropium bromide monohydrate], and Niacin    Review of Systems   Review of Systems  Constitutional:  Negative for chills and fever.  Respiratory:  Negative for cough, chest tightness and shortness of breath.   Cardiovascular:  Negative for chest pain.  Musculoskeletal:  Positive for joint swelling.  Skin:  Positive for color change. Negative for pallor.  Neurological:  Negative for weakness and headaches.  All other systems reviewed and are negative.   Physical Exam Updated Vital Signs BP (!) 115/48 (BP Location: Right Arm)   Pulse 88   Temp 97.8 F (36.6 C) (Oral)   Resp  20   Ht 5\' 11"  (1.803 m)   Wt 74.8 kg   SpO2 100%   BMI 23.01 kg/m  Physical Exam Vitals and nursing note reviewed.  Constitutional:      General: He is not in acute distress.    Appearance: He is not ill-appearing.  HENT:     Head: Normocephalic and atraumatic.  Cardiovascular:     Rate and Rhythm: Normal rate. Rhythm irregular.     Pulses: Normal pulses.     Comments: Unable to palpate or auscultate with doppler DP and PT pulses on left leg Pulmonary:     Effort: Pulmonary effort is normal. No respiratory distress.     Breath sounds: Normal breath sounds.  Abdominal:     General: Abdomen is flat.  Musculoskeletal:        General: Swelling and tenderness present.  Skin:    General: Skin is warm.     Capillary Refill: Capillary refill takes less than 2 seconds.     Coloration: Skin is not pale.     Findings: No bruising or erythema.     Comments: Skin of left lower extremity is somewhat red but does not appear to be cellulitic.  Skin is cool to the touch.  Neurological:     Mental  Status: He is alert.     Sensory: Sensory deficit present.     Motor: Weakness present.  Psychiatric:        Mood and Affect: Mood normal.     ED Results / Procedures / Treatments   Labs (all labs ordered are listed, but only abnormal results are displayed) Labs Reviewed  COMPREHENSIVE METABOLIC PANEL - Abnormal; Notable for the following components:      Result Value   Sodium 130 (*)    CO2 19 (*)    Glucose, Bld 117 (*)    BUN 68 (*)    Creatinine, Ser 2.37 (*)    Calcium 8.4 (*)    Total Protein 6.2 (*)    Albumin 2.7 (*)    ALT 53 (*)    GFR, Estimated 26 (*)    All other components within normal limits  CBC WITH DIFFERENTIAL/PLATELET - Abnormal; Notable for the following components:   WBC 17.7 (*)    RBC 3.76 (*)    Hemoglobin 11.8 (*)    HCT 35.3 (*)    Neutro Abs 14.5 (*)    Abs Immature Granulocytes 0.32 (*)    All other components within normal limits  PROTIME-INR - Abnormal; Notable for the following components:   Prothrombin Time 15.7 (*)    INR 1.3 (*)    All other components within normal limits  APTT  LACTIC ACID, PLASMA  HEPARIN LEVEL (UNFRACTIONATED)  URINALYSIS, ROUTINE W REFLEX MICROSCOPIC  HEPARIN LEVEL (UNFRACTIONATED)  CBC  COMPREHENSIVE METABOLIC PANEL    EKG None  Radiology CT ANGIO AO+BIFEM W & OR WO CONTRAST  Result Date: 01/17/2023 CLINICAL DATA:  Peripheral arterial disease EXAM: CT ANGIOGRAPHY OF ABDOMINAL AORTA WITH ILIOFEMORAL RUNOFF TECHNIQUE: Multidetector CT imaging of the abdomen, pelvis and lower extremities was performed using the standard protocol during bolus administration of intravenous contrast. Multiplanar CT image reconstructions and MIPs were obtained to evaluate the vascular anatomy. RADIATION DOSE REDUCTION: This exam was performed according to the departmental dose-optimization program which includes automated exposure control, adjustment of the mA and/or kV according to patient size and/or use of iterative  reconstruction technique. CONTRAST:  OMNIPAQUE IOHEXOL 350 MG/ML SOLN COMPARISON:  CT chest abdomen pelvis 11/16/2021 FINDINGS: VASCULAR Aorta: Calcified atheromatous plaque seen throughout the suprarenal abdominal aorta without aneurysm or significant stenosis. The infrarenal abdominal aorta is occluded. Celiac: Mild narrowing at the origin of the celiac artery due to calcified plaque. Left gastric artery originates directly from the aorta. No significant abnormality of the splenic or common hepatic arteries. 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 There is reopacification of the superior mesenteric artery 3.5 cm from the origin consistent with retrograde collateral flow. Renals: Complete to near complete occlusion at the origin of the right main renal artery due to calcified plaque. Moderate stenosis at the origin of the left main renal artery. IMA: Occluded at the origin. Opacification of distal branches consistent with retrograde collateral flow. RIGHT Lower Extremity Inflow: Right common and external iliac arteries are occluded. Proximal internal iliac artery is occluded. Opacification of distal branches consistent with retrograde collateral flow. Outflow: Flow reconstitutes within the right common femoral artery. Moderate stenosis of the right common femoral artery due to calcified plaque, best seen on image 212 of series 5. Near complete occlusion at the origin of the profunda femoris artery due to calcified plaque. The remainder of the profunda femoris artery is patent. The superficial femoral artery is diffusely calcified but patent without focal high-grade stenosis. Popliteal artery is diminutive but patent. Runoff: Three-vessel runoff to the right ankle. LEFT Lower Extremity Inflow: Left common and external iliac arteries are occluded. Internal iliac artery occluded proximally. Opacification of distal branches consistent with retrograde collateral flow. Outflow: Moderate narrowing  of the left common femoral artery due to calcified plaque. Profunda femoris artery is patent without significant stenosis. Left superficial femoral artery is occluded, beginning proximally 9 cm from the origin. Left popliteal artery is occluded. Runoff: No significant opacification of the left lower leg arteries. Veins: No obvious venous abnormality within the limitations of this arterial phase study. Review of the MIP images confirms the above findings. NON-VASCULAR Lower chest: No acute abnormality. Hepatobiliary: Subcentimeter hypodensity at the dome of the right liver lobe is too small to fully characterize. Mild cholelithiasis. No bile duct dilatation. Pancreas: Unremarkable. No pancreatic ductal dilatation or surrounding inflammatory changes. Spleen: Normal in size without focal abnormality. Adrenals/Urinary Tract: Adrenal glands are normal. Atrophy of the lower pole the right kidney may be due to prior infarct or inflammation. Two small simple cysts at the upper pole of the left kidney measuring up to 2.1 cm do not require further imaging follow-up. No significant abnormality of the bladder or ureters. Stomach/Bowel: Small hiatal hernia. No bowel dilatation to indicate ileus or obstruction. Descending and sigmoid colon diverticulosis without evidence of acute diverticulitis. Normal appendix. Lymphatic: No enlarged abdominal or pelvic lymph nodes. Reproductive: Moderately enlarged prostate. Other: Diffuse subcutaneous edema of the calves bilaterally. No ascites. Mild rectus sheath diastasis. Fluid collections noted within the rectus sheath bilaterally measuring 9.2 x 2.8 x 1.9 cm (image 79, series 9) on the right and 8.9 x 2.4 x 2.1 cm on the left. (Image 86, series 9. These collections are new compared to the prior examinations and may relate to seromas, hematomas, or abscesses. Musculoskeletal: Bilateral rib deformities consistent with remote healed fractures. Unchanged moderate compression fracture of L3  vertebral body. No new compression fractures are identified. IMPRESSION: 1. 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 3. Moderate stenosis of the common femoral arteries bilaterally, worse on the left. 4. Occluded left superficial femoral artery and popliteal arteries. 5. No opacification  of the left lower leg arteries. 6. Proximal occlusion of superior mesenteric artery with reconstitution of flow 3.5 cm from the origin. 7. Fluid collections within the rectus sheath bilaterally measuring 9.2 x 2.8 x 1.9 cm on the right and 8.9 x 2.4 x 2.1 cm on the left may relate to seromas, hematomas, or abscesses. 8. Distal colonic diverticulosis. Electronically Signed   By: Acquanetta Belling M.D.   On: 01/17/2023 15:00    Procedures .Critical Care  Performed by: Smitty Knudsen, PA-C Authorized by: Smitty Knudsen, PA-C   Critical care provider statement:    Critical care time (minutes):  30   Critical care start time:  01/17/2023 12:30 PM   Critical care end time:  01/17/2023 1:00 PM   Critical care time was exclusive of:  Separately billable procedures and treating other patients   Critical care was necessary to treat or prevent imminent or life-threatening deterioration of the following conditions:  Circulatory failure   Critical care was time spent personally by me on the following activities:  Development of treatment plan with patient or surrogate, discussions with consultants, evaluation of patient's response to treatment, examination of patient, ordering and review of laboratory studies, ordering and review of radiographic studies, ordering and performing treatments and interventions, pulse oximetry, re-evaluation of patient's condition, review of old charts and blood draw for specimens   I assumed direction of critical care for this patient from another provider in my specialty: no     Care discussed with: admitting provider      Medications Ordered in  ED Medications  heparin ADULT infusion 100 units/mL (25000 units/273mL) (1,200 Units/hr Intravenous New Bag/Given 01/17/23 1350)  amoxicillin-clavulanate (AUGMENTIN) 500-125 MG per tablet 1 tablet (has no administration in time range)  aspirin EC tablet 81 mg (has no administration in time range)  atorvastatin (LIPITOR) tablet 20 mg (has no administration in time range)  umeclidinium bromide (INCRUSE ELLIPTA) 62.5 MCG/ACT 1 puff (0 puffs Inhalation Hold 01/17/23 1645)  albuterol (PROVENTIL) (2.5 MG/3ML) 0.083% nebulizer solution 2.5 mg (has no administration in time range)  brinzolamide (AZOPT) 1 % ophthalmic suspension 1 drop (has no administration in time range)    And  brimonidine (ALPHAGAN) 0.2 % ophthalmic solution 1 drop (has no administration in time range)  polyvinyl alcohol (LIQUIFILM TEARS) 1.4 % ophthalmic solution 1 drop (has no administration in time range)  docusate sodium (COLACE) capsule 100 mg (has no administration in time range)  ferrous sulfate tablet 325 mg (has no administration in time range)  fluticasone furoate-vilanterol (BREO ELLIPTA) 200-25 MCG/ACT 1 puff (0 puffs Inhalation Hold 01/17/23 1644)  gabapentin (NEURONTIN) capsule 300 mg (has no administration in time range)  guaiFENesin (MUCINEX) 12 hr tablet 600 mg (has no administration in time range)  latanoprost (XALATAN) 0.005 % ophthalmic solution 1 drop (has no administration in time range)  nystatin (MYCOSTATIN/NYSTOP) topical powder 1 Application (has no administration in time range)  pantoprazole (PROTONIX) EC tablet 40 mg (has no administration in time range)  sodium chloride tablet 1 g (has no administration in time range)  tamsulosin (FLOMAX) capsule 0.4 mg (has no administration in time range)  traZODone (DESYREL) tablet 25 mg (has no administration in time range)  zinc oxide 20 % ointment 1 Application (has no administration in time range)  HYDROmorphone (DILAUDID) injection 0.5 mg (has no administration in  time range)  iohexol (OMNIPAQUE) 350 MG/ML injection 100 mL (100 mLs Intravenous Contrast Given 01/17/23 1243)  oxyCODONE-acetaminophen (PERCOCET/ROXICET) 5-325 MG per tablet  1 tablet (1 tablet Oral Given 01/17/23 1314)  heparin bolus via infusion 5,000 Units (5,000 Units Intravenous Bolus from Bag 01/17/23 1350)    ED Course/ Medical Decision Making/ A&P Clinical Course as of 01/17/23 1713  Wed Jan 17, 2023  1202 Stable  [CC]  1211 Evaluated personally at bedside. He is endorsing 1 day of left lower extremity pain and numbness.  He states that he walks at baseline and has difficulty bearing weight on the leg.  Feels that it has gotten worse today.  Endorses some feeling of numbness down by the toes, it feels cool to the touch compared to the other side.  Visible skin differential. I personally performed power Doppler, see procedure note.  He has arterial waveforms in his tibial arteries though low amplitude.  Will consult vascular surgery, get CTA runoff of the lower extremities, get vascular arterial and venous duplexes as well as laboratory evaluation for further care and management.  [CC]    Clinical Course User Index [CC] Glyn Ade, MD                           Medical Decision Making Amount and/or Complexity of Data Reviewed Labs: ordered.   This patient presents to the ED for concern of left leg pain, this involves an extensive number of treatment options, and is a complaint that carries with it a high risk of complications and morbidity.  The differential diagnosis includes limb ischemia, DVT, compartment syndrome,    Co morbidities that complicate the patient evaluation  Aortic atherosclerosis, COPD, recent acute hypoxic respiratory failure, malnutrition   Lab Tests:  I Ordered, and personally interpreted labs.  The pertinent results include:  CBC with leukocytosis at 17.7, CMP with signs of AKI with bumped creatinine at 2.37, APTT, Protime-INR, initial lactic acid at  1.1   Imaging Studies ordered:  I ordered imaging studies including CT angio AO=bifemoral    Consultations Obtained:  I requested consultation with the vascular surgery and family medicine teaching service,  and discussed lab and imaging findings as well as pertinent plan - they recommend: inpatient admission for possible vascular interventions to save limb. Dr. Laroy Apple with family medicine agrees to admit patient for management and stabilization, Dr. Chestine Spore, vascular surgery, weighing intervention options with patient family. No decision made yet on intervention at this time.   Problem List / ED Course / Critical interventions / Medication management  Patient presented to the ED with left leg and foot pain beginning this morning. He reports he was walking fine yesterday and woke up with significant pain in left leg. Patient not currently on blood thinners. Given presentation and nurse reporting inability to palpate DP or PT pulses and no pulse heard on doppler, attempted to find pulses personally and was unsuccessful. Ordered CT angio to determine presence of possible arterial ischemia which appears to be somewhere in the femoral around mid-thigh based on personal interpretation. Dr. Doran Durand consulted with vascular surgery and they advised inpatient admission with medicine for management of extensive comorbidities. Based on Dr. Ophelia Charter assessment, patient likely requires intervention although very slight flow was noted on doppler. Dr. Chestine Spore discussed benefits/risks of different surgical interventions for patient and patient will decide with family best course of action. I ordered medication including Percocet, heparin for pain, anticoagulation  Reevaluation of the patient after these medicines showed that the patient improved I have reviewed the patients home medicines and have made adjustments as needed  Social Determinants of Health:  Recently admitted/discharged for pneumonia   Test  / Admission - Considered:  Admission with family medicine teaching service with Dr. Laroy Apple and vascular surgery with Dr. Chestine Spore.  Given that patient has a fairly extensive comorbidity history and burden, I am agreeable with this plan as patient likely need inpatient management and stabilization and with medicine in conjunction with vascular surgical intervention.  Final Clinical Impression(s) / ED Diagnoses Final diagnoses:  Acute kidney injury [facility])  Arterial ischemia    Rx / DC Orders ED Discharge Orders     None         Smitty Knudsen, PA-C 01/17/23 1713    Glyn Ade, MD 01/18/23 [phone]

## 2023-01-17 NOTE — Progress Notes (Signed)
Patient in and out cath without difficulty, patient with 646m yellow urine returned. Patient tolerated well bed side RN updated. Graysen Depaula, KBettina GaviaRN

## 2023-01-17 NOTE — Assessment & Plan Note (Deleted)
Resolved with cr 1.11  this AM. Removing foley today. Baseline Cr ~ 1.0  -Trend on a.m. BMP -Strict ins and outs

## 2023-01-17 NOTE — Assessment & Plan Note (Deleted)
Asymptomatic, improved. Last 2 doses of Augmentin today (2/16). -Augmentin 500-125 twice daily (last dose today) -Keep O2 sats 88 to 92% -Monitor fever curve

## 2023-01-18 ENCOUNTER — Encounter (HOSPITAL_COMMUNITY): Payer: Self-pay | Admitting: Anesthesiology

## 2023-01-18 ENCOUNTER — Inpatient Hospital Stay (HOSPITAL_COMMUNITY): Payer: No Typology Code available for payment source

## 2023-01-18 DIAGNOSIS — Z515 Encounter for palliative care: Secondary | ICD-10-CM

## 2023-01-18 DIAGNOSIS — I70222 Atherosclerosis of native arteries of extremities with rest pain, left leg: Secondary | ICD-10-CM | POA: Diagnosis present

## 2023-01-18 DIAGNOSIS — R2681 Unsteadiness on feet: Secondary | ICD-10-CM | POA: Diagnosis not present

## 2023-01-18 DIAGNOSIS — Z85820 Personal history of malignant melanoma of skin: Secondary | ICD-10-CM | POA: Diagnosis not present

## 2023-01-18 DIAGNOSIS — R338 Other retention of urine: Secondary | ICD-10-CM | POA: Diagnosis present

## 2023-01-18 DIAGNOSIS — Z66 Do not resuscitate: Secondary | ICD-10-CM | POA: Diagnosis present

## 2023-01-18 DIAGNOSIS — I959 Hypotension, unspecified: Secondary | ICD-10-CM | POA: Diagnosis present

## 2023-01-18 DIAGNOSIS — I4891 Unspecified atrial fibrillation: Secondary | ICD-10-CM | POA: Diagnosis present

## 2023-01-18 DIAGNOSIS — K5900 Constipation, unspecified: Secondary | ICD-10-CM | POA: Diagnosis not present

## 2023-01-18 DIAGNOSIS — I998 Other disorder of circulatory system: Secondary | ICD-10-CM | POA: Diagnosis not present

## 2023-01-18 DIAGNOSIS — R531 Weakness: Secondary | ICD-10-CM

## 2023-01-18 DIAGNOSIS — R410 Disorientation, unspecified: Secondary | ICD-10-CM | POA: Diagnosis not present

## 2023-01-18 DIAGNOSIS — G629 Polyneuropathy, unspecified: Secondary | ICD-10-CM | POA: Diagnosis present

## 2023-01-18 DIAGNOSIS — I10 Essential (primary) hypertension: Secondary | ICD-10-CM | POA: Diagnosis not present

## 2023-01-18 DIAGNOSIS — K219 Gastro-esophageal reflux disease without esophagitis: Secondary | ICD-10-CM | POA: Diagnosis present

## 2023-01-18 DIAGNOSIS — Z79899 Other long term (current) drug therapy: Secondary | ICD-10-CM | POA: Diagnosis not present

## 2023-01-18 DIAGNOSIS — N179 Acute kidney failure, unspecified: Secondary | ICD-10-CM | POA: Diagnosis not present

## 2023-01-18 DIAGNOSIS — R339 Retention of urine, unspecified: Secondary | ICD-10-CM

## 2023-01-18 DIAGNOSIS — D649 Anemia, unspecified: Secondary | ICD-10-CM | POA: Diagnosis present

## 2023-01-18 DIAGNOSIS — J189 Pneumonia, unspecified organism: Secondary | ICD-10-CM | POA: Diagnosis present

## 2023-01-18 DIAGNOSIS — M6281 Muscle weakness (generalized): Secondary | ICD-10-CM | POA: Diagnosis not present

## 2023-01-18 DIAGNOSIS — R600 Localized edema: Secondary | ICD-10-CM | POA: Diagnosis not present

## 2023-01-18 DIAGNOSIS — R2689 Other abnormalities of gait and mobility: Secondary | ICD-10-CM | POA: Diagnosis not present

## 2023-01-18 DIAGNOSIS — Z86711 Personal history of pulmonary embolism: Secondary | ICD-10-CM | POA: Diagnosis not present

## 2023-01-18 DIAGNOSIS — E785 Hyperlipidemia, unspecified: Secondary | ICD-10-CM | POA: Diagnosis not present

## 2023-01-18 DIAGNOSIS — Z7982 Long term (current) use of aspirin: Secondary | ICD-10-CM | POA: Diagnosis not present

## 2023-01-18 DIAGNOSIS — Z86718 Personal history of other venous thrombosis and embolism: Secondary | ICD-10-CM | POA: Diagnosis not present

## 2023-01-18 DIAGNOSIS — J440 Chronic obstructive pulmonary disease with acute lower respiratory infection: Secondary | ICD-10-CM | POA: Diagnosis present

## 2023-01-18 DIAGNOSIS — G4733 Obstructive sleep apnea (adult) (pediatric): Secondary | ICD-10-CM | POA: Diagnosis present

## 2023-01-18 DIAGNOSIS — Z887 Allergy status to serum and vaccine status: Secondary | ICD-10-CM | POA: Diagnosis not present

## 2023-01-18 DIAGNOSIS — J449 Chronic obstructive pulmonary disease, unspecified: Secondary | ICD-10-CM | POA: Diagnosis not present

## 2023-01-18 DIAGNOSIS — H93293 Other abnormal auditory perceptions, bilateral: Secondary | ICD-10-CM | POA: Diagnosis not present

## 2023-01-18 DIAGNOSIS — M62262 Nontraumatic ischemic infarction of muscle, left lower leg: Secondary | ICD-10-CM | POA: Diagnosis not present

## 2023-01-18 DIAGNOSIS — R1312 Dysphagia, oropharyngeal phase: Secondary | ICD-10-CM | POA: Diagnosis not present

## 2023-01-18 DIAGNOSIS — Z888 Allergy status to other drugs, medicaments and biological substances status: Secondary | ICD-10-CM | POA: Diagnosis not present

## 2023-01-18 DIAGNOSIS — Z95828 Presence of other vascular implants and grafts: Secondary | ICD-10-CM | POA: Diagnosis not present

## 2023-01-18 DIAGNOSIS — Z955 Presence of coronary angioplasty implant and graft: Secondary | ICD-10-CM | POA: Diagnosis not present

## 2023-01-18 DIAGNOSIS — H9193 Unspecified hearing loss, bilateral: Secondary | ICD-10-CM | POA: Diagnosis not present

## 2023-01-18 LAB — CBC
HCT: 30.8 % — ABNORMAL LOW (ref 39.0–52.0)
Hemoglobin: 10.3 g/dL — ABNORMAL LOW (ref 13.0–17.0)
MCH: 30.8 pg (ref 26.0–34.0)
MCHC: 33.4 g/dL (ref 30.0–36.0)
MCV: 92.2 fL (ref 80.0–100.0)
Platelets: 246 10*3/uL (ref 150–400)
RBC: 3.34 MIL/uL — ABNORMAL LOW (ref 4.22–5.81)
RDW: 13.9 % (ref 11.5–15.5)
WBC: 14.3 10*3/uL — ABNORMAL HIGH (ref 4.0–10.5)
nRBC: 0 % (ref 0.0–0.2)

## 2023-01-18 LAB — COMPREHENSIVE METABOLIC PANEL
ALT: 41 U/L (ref 0–44)
AST: 30 U/L (ref 15–41)
Albumin: 2.2 g/dL — ABNORMAL LOW (ref 3.5–5.0)
Alkaline Phosphatase: 64 U/L (ref 38–126)
Anion gap: 12 (ref 5–15)
BUN: 67 mg/dL — ABNORMAL HIGH (ref 8–23)
CO2: 16 mmol/L — ABNORMAL LOW (ref 22–32)
Calcium: 7.5 mg/dL — ABNORMAL LOW (ref 8.9–10.3)
Chloride: 101 mmol/L (ref 98–111)
Creatinine, Ser: 2.19 mg/dL — ABNORMAL HIGH (ref 0.61–1.24)
GFR, Estimated: 28 mL/min — ABNORMAL LOW (ref >60)
Glucose, Bld: 96 mg/dL (ref 70–99)
Potassium: 4.9 mmol/L (ref 3.5–5.1)
Sodium: 129 mmol/L — ABNORMAL LOW (ref 135–145)
Total Bilirubin: 1.1 mg/dL (ref 0.3–1.2)
Total Protein: 5.1 g/dL — ABNORMAL LOW (ref 6.5–8.1)

## 2023-01-18 LAB — HEPARIN LEVEL (UNFRACTIONATED)
Heparin Unfractionated: 0.7 IU/mL (ref 0.30–0.70)
Heparin Unfractionated: 0.51 IU/mL (ref 0.30–0.70)

## 2023-01-18 MED ORDER — TAMSULOSIN HCL 0.4 MG PO CAPS
0.8000 mg | ORAL_CAPSULE | Freq: Every day | ORAL | Status: DC
  Administered 2023-01-18 – 2023-01-22 (×5): 0.8 mg via ORAL
  Filled 2023-01-18 (×5): qty 2

## 2023-01-18 MED ORDER — HEPARIN (PORCINE) 25000 UT/250ML-% IV SOLN
950.0000 [IU]/h | INTRAVENOUS | Status: DC
  Administered 2023-01-19: 900 [IU]/h via INTRAVENOUS
  Filled 2023-01-18 (×2): qty 250

## 2023-01-18 MED ORDER — SODIUM CHLORIDE 0.9 % IV SOLN
1.5000 g | INTRAVENOUS | Status: DC
  Filled 2023-01-18 (×2): qty 1.5

## 2023-01-18 MED ORDER — CHLORHEXIDINE GLUCONATE CLOTH 2 % EX PADS
6.0000 | MEDICATED_PAD | Freq: Every day | CUTANEOUS | Status: DC
  Administered 2023-01-18 – 2023-01-21 (×4): 6 via TOPICAL

## 2023-01-18 NOTE — Evaluation (Addendum)
Clinical/Bedside Swallow Evaluation Patient Details  Name: Marcus Martin MRN: [id number] Date of Birth: Sep 12, 1933  Today's Date: 01/18/2023 Time: SLP Start Time (ACUTE ONLY): 1340 SLP Stop Time (ACUTE ONLY): 1350 SLP Time Calculation (min) (ACUTE ONLY): 10 min  Past Medical History:  Past Medical History:  Diagnosis Date   Bilateral hearing loss    COPD (chronic obstructive pulmonary disease) (HCC)    DVT (deep venous thrombosis) (HCC)    GERD (gastroesophageal reflux disease)    History of pulmonary embolus (PE)    HLD (hyperlipidemia)    HTN (hypertension)    Melanoma (HCC)    NSTEMI (non-ST elevated myocardial infarction) (HCC)    OSA (obstructive sleep apnea)    Peripheral neuropathy    Squamous cell skin cancer    Past Surgical History:  Past Surgical History:  Procedure Laterality Date   BIOPSY  11/07/2021   Procedure: BIOPSY;  Surgeon: Doran Stabler, MD;  Location: [facility];  Service: Gastroenterology;;   ESOPHAGOGASTRODUODENOSCOPY (EGD) WITH PROPOFOL N/A 11/07/2021   Procedure: ESOPHAGOGASTRODUODENOSCOPY (EGD) WITH PROPOFOL;  Surgeon: Doran Stabler, MD;  Location: [facility];  Service: Gastroenterology;  Laterality: N/A;   PERCUTANEOUS CORONARY STENT INTERVENTION (PCI-S)     RCA   HPI:  Marcus Martin is a 89 y.o. male presenting with left leg pain concerning for critical ischemia. Differential for this patient's presentation of this includes critical limb ischemia, DVT, compartment syndrome. CT angio of femorals showed infrarenal abdominal aortic occlusion, bilateral external iliac artery occlusion, and occluded left superficial femoral artery and popliteal arteries. Per chart plan is for left AKA 2/16. Found to have AKI, community acquired pneumonia. Per chart   Recently admitted from 01/08/2023 to 01/12/2023 for COPD exacerbation as well as right upper lobe cavitary pneumonia versus mass. PMH: ACDF with current cervical spine CT showing osteophytes, COPD,  GERD, OSA, bilateral hearing loss, HTN. Had BSE 11/05/21 and suspected a potential structural dysphagia from cervical osteophytes with potential MBS recommended. F/U ST 12/6 after EGD and pt indicated no difficulty swallowing and did not wish to have MBS. SLP offered educated for management of esophageal issues and notify PCP if he does want to have MBS in future.    Assessment / Plan / Recommendation  Clinical Impression  Pt exhibiting indications of pharyngeal dyshpagia with a hoarse vocal quality and congested cough at baseline. He denied difficulty swallowing initially but he has hearing loss and possible cognitive deficits. He had an immediate cough after thin liquids from a straw. Puree appeared functional for limited trials given and did not administer the solid texture. RN reported coughing with water. He has a history of cervical osteophytes seen on CT and in combination of clinical presentation today, an instrumental evaluation is recommended to visualize his swallow function. MBS scheduled for 1430 today. SLP Visit Diagnosis: Dysphagia, unspecified (R13.10)    Aspiration Risk  Moderate aspiration risk    Diet Recommendation Other (Comment) (MBS will be in 30 min-)        Other  Recommendations Oral Care Recommendations: Oral care BID    Recommendations for follow up therapy are one component of a multi-disciplinary discharge planning process, led by the attending physician.  Recommendations may be updated based on patient status, additional functional criteria and insurance authorization.  Follow up Recommendations        Assistance Recommended at Discharge    Functional Status Assessment    Frequency and Duration  Prognosis        Swallow Study   General Date of Onset: 01/17/23 HPI: Marcus Martin is a 89 y.o. male presenting with left leg pain concerning for critical ischemia. Differential for this patient's presentation of this includes critical limb  ischemia, DVT, compartment syndrome. CT angio of femorals showed infrarenal abdominal aortic occlusion, bilateral external iliac artery occlusion, and occluded left superficial femoral artery and popliteal arteries. Per chart plan is for left AKA 2/16. Found to have AKI, community acquired pneumonia. Per chart   Recently admitted from 01/08/2023 to 01/12/2023 for COPD exacerbation as well as right upper lobe cavitary pneumonia versus mass. PMH: ACDF with current cervical spine CT showing osteophytes, COPD, GERD, OSA, bilateral hearing loss, HTN. Had BSE 11/05/21 and suspected a potential structural dysphagia from cervical osteophytes with potential MBS recommended. F/U ST 12/6 after EGD and pt indicated no difficulty swallowing and did not wish to have MBS. SLP offered educated for management of esophageal issues and notify PCP if he does want to have MBS in future. Type of Study: Bedside Swallow Evaluation Previous Swallow Assessment:  (see HPI) Diet Prior to this Study: Regular;Thin liquids (Level 0) Temperature Spikes Noted: No Respiratory Status: Room air History of Recent Intubation: No Behavior/Cognition: Alert;Cooperative;Requires cueing Oral Cavity Assessment: Within Functional Limits Oral Care Completed by SLP: No Oral Cavity - Dentition: Adequate natural dentition Vision: Functional for self-feeding Self-Feeding Abilities: Needs set up (due to tremors) Patient Positioning: Upright in bed Baseline Vocal Quality: Hoarse Volitional Cough: Strong;Congested Volitional Swallow: Able to elicit    Oral/Motor/Sensory Function Overall Oral Motor/Sensory Function:  (decreased lingual movement- may have been due to hearing or cognition)   Ice Chips Ice chips: Not tested   Thin Liquid Thin Liquid: Impaired Presentation: Cup;Straw Pharyngeal  Phase Impairments: Cough - Immediate    Nectar Thick Nectar Thick Liquid: Not tested   Honey Thick Honey Thick Liquid: Not tested   Puree Puree: Within  functional limits   Solid     Solid: Not tested      Houston Siren 01/18/2023,2:08 PM

## 2023-01-18 NOTE — Hospital Course (Addendum)
Marcus Martin is an 89 year old male who presented with concern for critical limb ischemia of left leg.  Additionally being treated for community-acquired pneumonia.  His hospital course is outlined below.  Left lower limb ischemia On initial presentation left leg was cold, unable to palpate DP/PT. CTA angio showed infrarenal abdominal aortic occlusion, bilateral external iliac artery occlusion, and occluded left superficial femoral artery and popliteal arteries.  He was started on a heparin drip and vascular surgery was consulted.  Vascular surgery believes that he would not be a good candidate for bypass, and recommended AKA which family and patient agreed with.  However on 2/15 prior to operation on 2/16 patient had improved pain and function of the left limb.  Vascular surgery canceled AKA, and recommended continuing anticoagulant therapy.***  AKI (acute kidney injury) ([place]) Creatinine initially elevated to 2.37, with a baseline of 0.9-1.  Noted to have dry mucous membranes, and bladder scan was performed.  Bladder scan showed greater than 600 mL, and Foley catheter was placed.  Flomax increased to 0.8 mg (home dose of 0.4 mg).  AKI believed to be secondary to obstructive pathology and continue to improve with appropriate urinary output.  Patient discharged with urinary catheter***  Community-acquired pneumonia Recently admitted from 01/08/2023 to 01/12/2023 for COPD exacerbation mild right upper lobe cavitary pneumonia versus mass.  He was treated in the hospital with IV antibiotics and transition to oral Augmentin.  At SNF, Augmentin was renally dosed.  We continued renally dosed Augmentin to complete his treatment course on 01/19/2023.  Patient remained asymptomatic during hospitalization.  Follow-up recommendations F/u with vascular outpatient

## 2023-01-18 NOTE — Progress Notes (Signed)
Modified Barium Swallow Study  Patient Details  Name: Donique Billick MRN: [id number] Date of Birth: [birth year]  Today's Date: 01/18/2023  Modified Barium Swallow completed.  Full report located under Chart Review in the Imaging Section.  History of Present Illness Brigido Polson is a 89 y.o. male presenting with left leg pain concerning for critical ischemia. Differential for this patient's presentation of this includes critical limb ischemia, DVT, compartment syndrome. CT angio of femorals showed infrarenal abdominal aortic occlusion, bilateral external iliac artery occlusion, and occluded left superficial femoral artery and popliteal arteries. Per chart plan is for left AKA 2/16. Found to have AKI, community acquired pneumonia. Per chart   Recently admitted from 01/08/2023 to 01/12/2023 for COPD exacerbation as well as right upper lobe cavitary pneumonia versus mass. PMH: ACDF with current cervical spine CT showing osteophytes, COPD, GERD, OSA, bilateral hearing loss, HTN. Had BSE 11/05/21 and suspected a potential structural dysphagia from cervical osteophytes with potential MBS recommended. F/U ST 12/6 after EGD and pt indicated no difficulty swallowing and did not wish to have MBS. SLP offered educated for management of esophageal issues and notify PCP if he does want to have MBS in future.   Clinical Impression Pt exhibited minimal oropharyngeal dysphagia in setting of remote ACDF surgery and khyphotic/lordodic spinal presentation. He exhibited occasional difficulty controlling/containing bolus within oral cavity with some spill over posterior tongue. Swallows were initiated at the level of the pyriform sinuses. His anterior hyoid excursion, laryngeal elevation and closure and epiglottic inversion were all intact which prevented any penetration or aspiration during the study. He did have 4 instances where he immediately coughed after various swallow during study (as present during bedside evaluation)  although there was no barium seen in laryngeal vestibule or trachea during the swallow or when fluoro turned back on. Suspect cough due to current pna. He had trace-mild vallecular residue due to reduced tongue base retraction and slightly diminished stripping wave. Esophageal scan was unremarkable. Mastication and clearance of regular texture was functional and recommend he continue regular texture, thin liquids, pills with thin, sit upright, straws allowed. He does become dyspneic due to intermittent coughing and recommend he take breaks when needed during meals. ST will follow up briefly to reiterate swallow strategies. Factors that may increase risk of adverse event in presence of aspiration Phineas Douglas & Padilla 2021):    Swallow Evaluation Recommendations Recommendations: PO diet PO Diet Recommendation: Regular;Thin liquids (Level 0) Liquid Administration via: Cup;Straw Medication Administration: Whole meds with liquid Supervision: Staff to assist with self-feeding;Intermittent supervision/cueing for swallowing strategies Swallowing strategies  : Slow rate;Small bites/sips Postural changes: Position pt fully upright for meals Oral care recommendations: Oral care BID (2x/day)      Houston Siren 01/18/2023,3:25 PM

## 2023-01-18 NOTE — Progress Notes (Signed)
Pt refused to wear CPAP tonight. ?

## 2023-01-18 NOTE — Progress Notes (Addendum)
  Progress Note    01/18/2023 7:11 AM Hospital Day 1  Subjective:  sleeping and I did not wake him  Afebrile   Vitals:   01/18/23 0217 01/18/23 0455  BP: (!) 112/41 (!) 95/51  Pulse: 84 86  Resp: 16 12  Temp:  98.4 F (36.9 C)  SpO2: 99% 91%    Physical Exam: General:  no distress; sleeping  Lungs:  non labored Extremities:  left foot appears a little less mottled this morning  CBC    Component Value Date/Time   WBC 14.3 (H) 01/18/2023 0147   RBC 3.34 (L) 01/18/2023 0147   HGB 10.3 (L) 01/18/2023 0147   HCT 30.8 (L) 01/18/2023 0147   PLT 246 01/18/2023 0147   MCV 92.2 01/18/2023 0147   MCH 30.8 01/18/2023 0147   MCHC 33.4 01/18/2023 0147   RDW 13.9 01/18/2023 0147   LYMPHSABS 1.7 01/17/2023 1213   MONOABS 1.0 01/17/2023 1213   EOSABS 0.1 01/17/2023 1213   BASOSABS 0.0 01/17/2023 1213    BMET    Component Value Date/Time   NA 129 (L) 01/18/2023 0147   K 4.9 01/18/2023 0147   CL 101 01/18/2023 0147   CO2 16 (L) 01/18/2023 0147   GLUCOSE 96 01/18/2023 0147   BUN 67 (H) 01/18/2023 0147   CREATININE 2.19 (H) 01/18/2023 0147   CALCIUM 7.5 (L) 01/18/2023 0147   GFRNONAA 28 (L) 01/18/2023 0147    INR    Component Value Date/Time   INR 1.3 (H) 01/17/2023 1213     Intake/Output Summary (Last 24 hours) at 01/18/2023 2637 Last data filed at 01/18/2023 0600 Gross per 24 hour  Intake 1600.41 ml  Output 1500 ml  Net 100.41 ml     Assessment/Plan:  89 y.o. male with LLE ischemia with plan for left AKA tomorrow  Hospital Day 1  -pt sleeping comfortably so I did not wake him. -plan for left AKA tomorrow with Dr. Carlis Abbott.   -npo after MN-pre op orders placed.   Leontine Locket, PA-C [facility] [phone] 01/18/2023 7:11 AM

## 2023-01-18 NOTE — Progress Notes (Addendum)
     Daily Progress Note Intern Pager: [phone]  Patient name: Marcus Martin Medical record number: [id number] Date of birth: 1933/01/13 Age: 89 y.o. Gender: male  Primary Care Provider: Curly Rim, MD Consultants: Vascular Surgery Code Status: DNR/DNI  Pt Overview and Major Events to Date:  2/14: Admitted  Assessment and Plan: Marcus Martin is a 89 y.o. male presenting with left leg pain concerning for critical ischemia. Poor candidate for multiple vascular surgeries- patient and family have opted for AKA.   * Lower limb ischemia Plan for left AKA tomorrow 2/16. Patient is DNR/DNI, but may benefit from palliative consult. -Vascular surgery following, appreciate recommendations -Fall precautions -Dilaudid 0.5 mg every 4 hours as needed -Heparin gtt -NPO at [facility] 2/16  AKI (acute kidney injury) ([place]) Cr 2.19 this AM, improving. Likely 2/2 obstruction as patient had >600 ml on bladder scan. Foley now in place. Increased flomax to 0.'8mg'$ . -Continue flomax  -Trend on a.m. BMP -Strict ins and outs  Community acquired pneumonia Originally sent home with BID Augmentin. SNF changed to daily dosing, with end date of 2/16. Okay to continue treatment. His respiratory status is much improved since last admission. Breathing comfortably on RA. -Augmentin 500-125 twice daily (dose adjusted for renal function per pharmacy) -Keep O2 sats 88 to 92% -Monitor fever curve   OSA -CPAP at night  FEN/GI: Regular. NPO at midnight.  PPx: Heparin gtt Dispo: Pending surgery tomorrow  Subjective:  Feels well, aside from pain and cold sensation in left foot.  Objective: Temp:  [97.8 F (36.6 C)-98.4 F (36.9 C)] 98.4 F (36.9 C) (02/15 0824) Pulse Rate:  [79-92] 85 (02/15 0824) Resp:  [12-23] 17 (02/15 0824) BP: (83-141)/(40-99) 111/45 (02/15 0824) SpO2:  [91 %-100 %] 95 % (02/15 0824) Weight:  [74.6 kg-74.8 kg] 74.6 kg (02/15 0455) Physical Exam: General: NAD, chronically  ill-appearing Cardiovascular: RRR, no murmurs Respiratory: CTAB, normal wob on RA Extremities: Left foot cold, unable to palpate DP/PT, tender to touch, non-pitting edema. Right foot well perfused.   Laboratory: Most recent CBC Lab Results  Component Value Date   WBC 14.3 (H) 01/18/2023   HGB 10.3 (L) 01/18/2023   HCT 30.8 (L) 01/18/2023   MCV 92.2 01/18/2023   PLT 246 01/18/2023   Most recent BMP    Latest Ref Rng & Units 01/18/2023    1:47 AM  BMP  Glucose 70 - 99 mg/dL 96   BUN 8 - 23 mg/dL 67   Creatinine 0.61 - 1.24 mg/dL 2.19   Sodium 135 - 145 mmol/L 129   Potassium 3.5 - 5.1 mmol/L 4.9   Chloride 98 - 111 mmol/L 101   CO2 22 - 32 mmol/L 16   Calcium 8.9 - 10.3 mg/dL 7.5    Leslie Dales, DO 01/18/2023, 10:45 AM  PGY-1, [employer] Intern pager: [phone], text pages welcome Secure chat group [place]

## 2023-01-18 NOTE — Progress Notes (Signed)
Pt stable and tolerating room air and refusing CPAP QHS. Will continue to monitor

## 2023-01-18 NOTE — Progress Notes (Signed)
ANTICOAGULATION CONSULT NOTE - Follow-Up  Pharmacy Consult for heparin  Indication: L leg ischemia  Allergies  Allergen Reactions   Brilinta [Ticagrelor] Other (See Comments)    GI Hemorrhage   Coffea Arabica     DECAF coffee causes vision changes    Tetanus Toxoids Other (See Comments)    "Horse serum only" Unknown reaction Documented on MAR    Zocor [Simvastatin] Other (See Comments)    Myalgias    Spiriva Respimat [Tiotropium Bromide Monohydrate] Other (See Comments)    Urinary retention    Niacin Other (See Comments)    Flushing      Patient Measurements: Height: 5' 11"$  (180.3 cm) Weight: 74.6 kg (164 lb 7.4 oz) IBW/kg (Calculated) : 75.3 Heparin Dosing Weight: 74.8 kg  Vital Signs: Temp: 98.4 F (36.9 C) (02/15 0824) Temp Source: Oral (02/15 0824) BP: 111/45 (02/15 0824) Pulse Rate: 85 (02/15 0824)  Labs: Recent Labs    01/17/23 1213 01/17/23 2116 01/18/23 0147 01/18/23 0759  HGB 11.8*  --  10.3*  --   HCT 35.3*  --  30.8*  --   PLT 319  --  246  --   APTT 32  --   --   --   LABPROT 15.7*  --   --   --   INR 1.3*  --   --   --   HEPARINUNFRC  --  1.04*  --  0.70  CREATININE 2.37*  --  2.19*  --      Estimated Creatinine Clearance: 25.1 mL/min (A) (by C-G formula based on SCr of 2.19 mg/dL (H)).   Assessment: 70 YOM with L leg ischemia. No anticoagulant PTA. Hgb 11.8, plt 319. Pharmacy consulted to dose IV heparin.   Heparin level 0.7 (upper end of goal) on infusion at 1000 units/hr. No issues with line or bleeding reported per RN.  Noted plans for AKA 2/16.  Goal of Therapy:  Heparin level 0.3-0.7 units/ml Monitor platelets by anticoagulation protocol: Yes   Plan:  Reduce heparin infusion to 900 units/hr Obtain confirmation level in 6 hours Daily heparin level and CBC  Talyah Seder, Pharm.D., BCPS Clinical Pharmacist Clinical phone for 01/18/2023 from 7:30-3:00 is [phone].  **Pharmacist phone directory can be found on [email or web address]  listed under [place].  01/18/2023 9:51 AM

## 2023-01-18 NOTE — Consult Note (Signed)
Consultation Note Date: 01/18/2023   Patient Name: Marcus Martin  DOB: Apr 25, 1933  MRN: [id number]  Age / Sex: 89 y.o., male  PCP: Corrington, Delsa Grana, MD Referring Physician: McDiarmid, Blane Ohara, MD  Reason for Consultation: Establishing goals of care  HPI/Patient Profile: 89 y.o. male  admitted on 01/17/2023 with a PMH of GERD, COPD, SVT, NSTEMI s/p PCI, HTN, HLD, Afib, DVT/PE s/p IVC filter (no longer on Eliquis) who presented from SNF after waking up today with cold, painful and numb, and blue tinted LLE.   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 After discussion with surgeon decision made for left above the knee amputation.  Surgery is planned for tomorrow morning.  Patient's son tells me his father was living in assisted living at [facility].  He was utilizing a walker for ambulation.   Family face treatment options decisions, advanced directive decisions and anticipatory care needs.     Clinical Assessment and Goals of Care:  This NP Wadie Lessen reviewed medical records, received report from team, assessed the patient and then spoke to  the patient's son telephone   to discuss diagnosis, prognosis, GOC, EOL wishes disposition and options.    Concept of Palliative Care was introduced as specialized medical care for people and their families living with serious illness.  If focuses on providing relief from the symptoms and stress of a serious illness.  The goal is to improve quality of life for both the patient and the family.Values and goals of care important to patient and family were attempted to be elicited.  Created space and opportunity for family to explore thoughts and feelings regarding current medical situation.  Son verbalizes that he really does not see any other choice but the surgery understanding that the  ischemic limb would eventually "become gangrenous and require surgery anyway"  Emotional support offered, therapeutic listening.  A  discussion was had today regarding advanced directives.  Concepts specific to code status, artifical feeding and hydration, continued IV antibiotics and rehospitalization was had.    The difference between a aggressive medical intervention path  and a palliative comfort care path for this patient at this time was had.     Patient has a clearly documented living will expressing desire for natural death.   Son verbalizes concerns over anticipatory care needs, understandably concerned that patient will not be able to return to assisted living level of care.  I encouraged him to take this one step at a time, and make decisions on patient outcomes postop and in the days to come.   Questions and concerns addressed. Family encouraged to call with questions or concerns.     PMT will continue to support patient and family as they navigate his hospital course.      HCPOA/son/Daren Cossart    SUMMARY OF RECOMMENDATIONS    Code Status/Advance Care Planning: DNR   Palliative Prophylaxis:  Aspiration, Bowel Regimen, Delirium Protocol, and Frequent Pain Assessment  Additional  Recommendations (Limitations, Scope, Preferences): Full Scope Treatment  Psycho-social/Spiritual:  Desire for further Chaplaincy support:no   Prognosis:  Unable to determine  Discharge Planning: To Be Determined      Primary Diagnoses: Present on Admission:  Lower limb ischemia  Community acquired pneumonia   I have reviewed the medical record, interviewed the patient and family, and examined the patient. The following aspects are pertinent.  Past Medical History:  Diagnosis Date   Bilateral hearing loss    COPD (chronic obstructive pulmonary disease) (HCC)    DVT (deep venous thrombosis) (HCC)    GERD (gastroesophageal reflux disease)    History of pulmonary embolus  (PE)    HLD (hyperlipidemia)    HTN (hypertension)    Melanoma (HCC)    NSTEMI (non-ST elevated myocardial infarction) (HCC)    OSA (obstructive sleep apnea)    Peripheral neuropathy    Squamous cell skin cancer    Social History   Socioeconomic History   Marital status: Widowed    Spouse name: Not on file   Number of children: Not on file   Years of education: Not on file   Highest education level: Not on file  Occupational History   Not on file  Tobacco Use   Smoking status: Former    Types: Cigarettes    Quit date: 1980    Years since quitting: 44.1   Smokeless tobacco: Never  Substance and Sexual Activity   Alcohol use: Not Currently   Drug use: Never   Sexual activity: Not on file    Comment: -  Other Topics Concern   Not on file  Social History Narrative   Not on file   Social Determinants of Health   Financial Resource Strain: Not on file  Food Insecurity: No Food Insecurity (01/08/2023)   Hunger Vital Sign    Worried About Running Out of Food in the Last Year: Never true    Ran Out of Food in the Last Year: Never true  Transportation Needs: No Transportation Needs (01/08/2023)   PRAPARE - Hydrologist (Medical): No    Lack of Transportation (Non-Medical): No  Physical Activity: Not on file  Stress: Not on file  Social Connections: Not on file   Family History  Problem Relation Age of Onset   Cancer Mother    Scheduled Meds:  amoxicillin-clavulanate  1 tablet Oral BID   aspirin EC  81 mg Oral Daily   atorvastatin  20 mg Oral QPM   brinzolamide  1 drop Both Eyes TID   And   brimonidine  1 drop Both Eyes TID   Chlorhexidine Gluconate Cloth  6 each Topical Q0600   docusate sodium  100 mg Oral BID   ferrous sulfate  325 mg Oral Q M,W,F   fluticasone furoate-vilanterol  1 puff Inhalation Daily   gabapentin  300 mg Oral BID   latanoprost  1 drop Both Eyes QHS   pantoprazole  40 mg Oral BID   sodium chloride  1 g Oral TID WC    tamsulosin  0.8 mg Oral QHS   traZODone  25 mg Oral QHS   umeclidinium bromide  1 puff Inhalation Daily   Continuous Infusions:  sodium chloride 115 mL/hr at 01/18/23 1140   [START ON 01/19/2023] cefUROXime (ZINACEF)  IV     heparin 900 Units/hr (01/18/23 1140)   PRN Meds:.albuterol, guaiFENesin, HYDROmorphone (DILAUDID) injection, nystatin, polyvinyl alcohol, zinc oxide Medications Prior to Admission:  Prior to Admission  medications   Medication Sig Start Date End Date Taking? Authorizing Provider  acetaminophen (TYLENOL) 500 MG tablet Take 1,000 mg by mouth every 8 (eight) hours as needed for fever.   Yes [provider]  Aclidinium Bromide (TUDORZA PRESSAIR) 400 MCG/ACT AEPB Inhale 1 puff into the lungs in the morning and at bedtime. 01/12/23  Yes Gerrit Heck, MD  albuterol (VENTOLIN HFA) 108 (90 Base) MCG/ACT inhaler Inhale 2 puffs into the lungs every 6 (six) hours as needed for wheezing or shortness of breath.   Yes [provider]  amoxicillin-clavulanate (AUGMENTIN) 875-125 MG tablet Take 1 tablet by mouth every 12 (twelve) hours. Patient taking differently: Take 1 tablet by mouth daily. 10 day course. 01/12/23  Yes Gerrit Heck, MD  ascorbic acid (VITAMIN C) 500 MG tablet Take 500 mg by mouth daily.   Yes [provider]  aspirin EC 81 MG tablet Take 81 mg by mouth daily.   Yes [provider]  atorvastatin (LIPITOR) 20 MG tablet Take 20 mg by mouth every evening.   Yes [provider]  Brinzolamide-Brimonidine (SIMBRINZA) 1-0.2 % SUSP Place 1 drop into both eyes 2 (two) times daily.   Yes [provider]  Calcium Carb-Cholecalciferol (CALCIUM 500 +D) 500-10 MG-MCG TABS Take 1 tablet by mouth daily.   Yes [provider]  carboxymethylcellulose (REFRESH TEARS) 0.5 % SOLN Place 1 drop into both eyes 4 (four) times daily.   Yes [provider]  cholecalciferol 25 MCG (1000 UT) tablet Take 1,000 Units by  mouth daily.   Yes [provider]  docusate sodium (COLACE) 100 MG capsule Take 100 mg by mouth 2 (two) times daily.   Yes [provider]  ferrous sulfate 325 (65 FE) MG EC tablet Take 325 mg by mouth every Monday, Wednesday, and Friday.   Yes [provider]  fluticasone-salmeterol (WIXELA INHUB) 250-50 MCG/ACT AEPB Inhale 1 puff into the lungs in the morning and at bedtime.   Yes [provider]  furosemide (LASIX) 20 MG tablet Take 20 mg by mouth See admin instructions. 2 entries on MAR: 20 mg once a morning for 3 days (01/16/23-01/18/23) + 20 mg once daily as needed for signs of volume overload (gain of 3 lbs over night or 5 lbs in 1 week, increased shortness of breath and/or increased leg swelling)   Yes [provider]  gabapentin (NEURONTIN) 300 MG capsule Take 1 capsule (300 mg total) by mouth 2 (two) times daily. 01/12/23  Yes Gerrit Heck, MD  guaiFENesin (MUCINEX) 600 MG 12 hr tablet Take 600 mg by mouth every 12 (twelve) hours.   Yes [provider]  ipratropium-albuterol (DUONEB) 0.5-2.5 (3) MG/3ML SOLN Take 3 mLs by nebulization every 6 (six) hours.   Yes [provider]  latanoprost (XALATAN) 0.005 % ophthalmic solution Place 1 drop into both eyes at bedtime.   Yes [provider]  lisinopril (ZESTRIL) 10 MG tablet Take 5 mg by mouth See admin instructions. 5 mg once daily. HOLD for SBP < 100.   Yes [provider]  Lutein 10 MG TABS Take 10 mg by mouth daily.   Yes [provider]  Multiple Vitamin (MULTIVITAMIN) tablet Take 1 tablet by mouth daily. Multivitamin + Folic acid [id number] mcg.   Yes [provider]  Neomycin-Bacitracin-Polymyxin (TRIPLE ANTIBIOTIC) OINT Apply 1 Application topically 2 (two) times daily as needed (minor wounds/cuts).   Yes [provider]  nystatin powder Apply 1 Application topically 2 (two)  times daily as needed (irritation). To groin area   Yes  [provider]  pantoprazole (PROTONIX) 40 MG tablet Take 1 tablet (40 mg total) by mouth 2 (two) times daily before a meal. Patient taking differently: Take 40 mg by mouth 2 (two) times daily. 11/08/21  Yes Virl Axe, MD  Probiotic Product (RISA-BID PROBIOTIC) TABS Take 1 tablet by mouth daily.   Yes [provider]  senna (SENOKOT) 8.6 MG TABS tablet Take 17.2 mg by mouth daily as needed (constipation).   Yes [provider]  Skin Protectants, Misc. (MINERIN CREME) CREA Apply 1 Application topically daily. To legs and back.   Yes [provider]  sodium chloride 1 g tablet Take 1 tablet (1 g total) by mouth 3 (three) times daily with meals. 11/08/21  Yes Virl Axe, MD  tamsulosin (FLOMAX) 0.4 MG CAPS capsule Take 0.4 mg by mouth See admin instructions. 0.4 mg once daily at bedtime. HOLD for SBP < 110.   Yes [provider]  traZODone (DESYREL) 50 MG tablet Take 0.5 tablets (25 mg total) by mouth at bedtime. 01/12/23  Yes Gerrit Heck, MD  zinc oxide 20 % ointment Apply 1 Application topically as needed for irritation. To buttocks.   Yes [provider]  predniSONE (DELTASONE) 20 MG tablet Take 2 tablets (40 mg total) by mouth daily with breakfast. Patient not taking: Reported on 01/17/2023 01/13/23   Gerrit Heck, MD   Allergies  Allergen Reactions   Brilinta [Ticagrelor] Other (See Comments)    GI Hemorrhage   Coffea Arabica     DECAF coffee causes vision changes    Tetanus Toxoids Other (See Comments)    "Horse serum only" Unknown reaction Documented on MAR    Zocor [Simvastatin] Other (See Comments)    Myalgias    Spiriva Respimat [Tiotropium Bromide Monohydrate] Other (See Comments)    Urinary retention    Niacin Other (See Comments)    Flushing     Review of Systems  Physical Exam  Vital Signs: BP (!) 108/37 (BP Location: Right Arm)   Pulse 86   Temp 98.6 F (37 C) (Oral)   Resp 20   Ht '5\' 11"'$  (1.803  m)   Wt 74.6 kg   SpO2 94%   BMI 22.94 kg/m  Pain Scale: 0-10   Pain Score: Asleep   SpO2: SpO2: 94 % O2 Device:SpO2: 94 % O2 Flow Rate: .O2 Flow Rate (L/min): 2 L/min  IO: Intake/output summary:  Intake/Output Summary (Last 24 hours) at 01/18/2023 1305 Last data filed at 01/18/2023 1140 Gross per 24 hour  Intake 2650.71 ml  Output 1500 ml  Net 1150.71 ml    LBM: Last BM Date :  (PTA) Baseline Weight: Weight: 74.8 kg Most recent weight: Weight: 74.6 kg     Palliative Assessment/Data: 40 % at best   Discussed with bedside RN  Time In: 1200 Time Out: 1315 Time Total: 75 minutes Greater than 50%  of this time was spent counseling and coordinating care related to the above assessment and plan.  Signed by: Wadie Lessen, NP   Please contact Palliative Medicine Team phone at [phone] for questions and concerns.  For individual provider: See Shea Evans

## 2023-01-18 NOTE — Progress Notes (Signed)
At around 2017, the patient's BP was 83/45 in one arm and 81/40 in the other. His manual BP was 88/40. Also, the bladder scan at that time showed 642 mL in his bladder and an In & Out cath at 1850 had emptied 600 cc.  The on call physician was paged. Dr. Gwendolyn Lima ordered to insert a foley catheter and to give a one time IV Normal saline 500 mL bolus. After the bolus was completed the patient's BP went up to 141/99. Furthermore, after the foley catheter was placed, 650 mL of urine was out and the midnight bladder scan showed no urine in his bladder.  Will continue to monitor.    Lupita Dawn, RN

## 2023-01-18 NOTE — Progress Notes (Addendum)
Patient is scheduled for left above-knee amputation with me tomorrow.  Presented with acute on chronic limb ischemia of the left lower extremity yesterday.  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  He has been soaking on heparin overnight.  He tells me this afternoon that he has 0 pain in the left foot.  Yesterday his pain was 10 out of 10.  The color is better.  His motor function is improved.  I will cancel his amputation tomorrow and have discussed with his family at bedside.  Will see how he improves with anticoagulation and mobility and see if he can tolerate his level of ischemia.  I will cancel the order to hold his heparin at midnight for planned spinal anesthesia in the am since his family wants to avoid intubation.  [name], MD [facility] Office: [place]

## 2023-01-19 ENCOUNTER — Encounter (HOSPITAL_COMMUNITY)
Admission: EM | Disposition: A | Discharge status: Skilled Nursing Facility | Payer: Self-pay | Source: Skilled Nursing Facility | Attending: Family Medicine

## 2023-01-19 DIAGNOSIS — R410 Disorientation, unspecified: Secondary | ICD-10-CM

## 2023-01-19 DIAGNOSIS — N179 Acute kidney failure, unspecified: Secondary | ICD-10-CM | POA: Diagnosis not present

## 2023-01-19 DIAGNOSIS — I998 Other disorder of circulatory system: Secondary | ICD-10-CM | POA: Diagnosis not present

## 2023-01-19 DIAGNOSIS — R339 Retention of urine, unspecified: Secondary | ICD-10-CM | POA: Diagnosis not present

## 2023-01-19 LAB — BASIC METABOLIC PANEL
Anion gap: 7 (ref 5–15)
BUN: 54 mg/dL — ABNORMAL HIGH (ref 8–23)
CO2: 16 mmol/L — ABNORMAL LOW (ref 22–32)
Calcium: 7.2 mg/dL — ABNORMAL LOW (ref 8.9–10.3)
Chloride: 107 mmol/L (ref 98–111)
Creatinine, Ser: 1.79 mg/dL — ABNORMAL HIGH (ref 0.61–1.24)
GFR, Estimated: 36 mL/min — ABNORMAL LOW (ref >60)
Glucose, Bld: 96 mg/dL (ref 70–99)
Potassium: 4.1 mmol/L (ref 3.5–5.1)
Sodium: 130 mmol/L — ABNORMAL LOW (ref 135–145)

## 2023-01-19 LAB — CBC
HCT: 25.6 % — ABNORMAL LOW (ref 39.0–52.0)
HCT: 31.3 % — ABNORMAL LOW (ref 39.0–52.0)
Hemoglobin: 8.9 g/dL — ABNORMAL LOW (ref 13.0–17.0)
Hemoglobin: 10.6 g/dL — ABNORMAL LOW (ref 13.0–17.0)
MCH: 31.7 pg (ref 26.0–34.0)
MCH: 31.4 pg (ref 26.0–34.0)
MCHC: 34.8 g/dL (ref 30.0–36.0)
MCHC: 33.9 g/dL (ref 30.0–36.0)
MCV: 91.1 fL (ref 80.0–100.0)
MCV: 92.6 fL (ref 80.0–100.0)
Platelets: 177 10*3/uL (ref 150–400)
Platelets: 205 10*3/uL (ref 150–400)
RBC: 2.81 MIL/uL — ABNORMAL LOW (ref 4.22–5.81)
RBC: 3.38 MIL/uL — ABNORMAL LOW (ref 4.22–5.81)
RDW: 14 % (ref 11.5–15.5)
RDW: 13.9 % (ref 11.5–15.5)
WBC: 12 10*3/uL — ABNORMAL HIGH (ref 4.0–10.5)
WBC: 12.4 10*3/uL — ABNORMAL HIGH (ref 4.0–10.5)
nRBC: 0 % (ref 0.0–0.2)
nRBC: 0 % (ref 0.0–0.2)

## 2023-01-19 LAB — HEPARIN LEVEL (UNFRACTIONATED): Heparin Unfractionated: 0.29 IU/mL — ABNORMAL LOW (ref 0.30–0.70)

## 2023-01-19 LAB — APTT: aPTT: 116 seconds — ABNORMAL HIGH (ref 24–36)

## 2023-01-19 SURGERY — AMPUTATION, ABOVE KNEE
Anesthesia: Spinal | Site: Knee | Laterality: Left

## 2023-01-19 NOTE — Evaluation (Addendum)
Physical Therapy Evaluation Patient Details Name: Marcus Martin MRN: [id number] DOB: 02-06-1933 Today's Date: 01/19/2023  History of Present Illness  Pt is an 89 y/o M admitted on 01/17/23 after presenting with LLE pain concerning for critical ischemia. Pt initially opted for L AKA but sx was cancelled after improvement following IV heparin. PMH: B hearing loss, COPD, GERD, HLD, HTN, NSTEMI, OSA, peripheral neuropathy  Clinical Impression  Pt seen for PT evaluation with pt agreeable. Pt reports prior to admission he resided in ALF & was ambulatory with upright walker. On this date, pt requires min assist for bed mobility, mod assist for STS, & min assist to take a few steps in room with RW. Pt presents with increased work of breathing & congested cough, poor ability to demonstrate pursed lip breathing; because of this, pt fatigues quickly. At this time, recommend STR upon d/c prior to return to ALF.    Recommendations for follow up therapy are one component of a multi-disciplinary discharge planning process, led by the attending physician.  Recommendations may be updated based on patient status, additional functional criteria and insurance authorization.  Follow Up Recommendations Skilled nursing-short term rehab (<3 hours/day) Can patient physically be transported by private vehicle: Yes    Assistance Recommended at Discharge Frequent or constant Supervision/Assistance  Patient can return home with the following  A little help with walking and/or transfers;A little help with bathing/dressing/bathroom    Equipment Recommendations None recommended by PT  Recommendations for Other Services  OT consult    Functional Status Assessment Patient has had a recent decline in their functional status and demonstrates the ability to make significant improvements in function in a reasonable and predictable amount of time.     Precautions / Restrictions Precautions Precautions:  Fall Restrictions Weight Bearing Restrictions: No      Mobility  Bed Mobility Overal bed mobility: Needs Assistance Bed Mobility: Supine to Sit     Supine to sit: Min assist, HOB elevated     General bed mobility comments: Cuing for use of bed rails, holds to PT's hand to pull trunk upright to sitting EOB    Transfers Overall transfer level: Needs assistance Equipment used: Rolling walker (2 wheels) Transfers: Sit to/from Stand Sit to Stand: Mod assist           General transfer comment: STS from elevated EOB with cuing for hand placement to push to standing    Ambulation/Gait Ambulation/Gait assistance: Min assist Gait Distance (Feet): 5 Feet Assistive device: Rolling walker (2 wheels) Gait Pattern/deviations: Decreased step length - right, Decreased step length - left, Decreased stride length Gait velocity: decreased     General Gait Details: Pt ambulates a few steps forwards & backwards in room with RW & min assist  Stairs            Wheelchair Mobility    Modified Rankin (Stroke Patients Only)       Balance Overall balance assessment: Needs assistance Sitting-balance support: Feet supported Sitting balance-Leahy Scale: Fair Sitting balance - Comments: close supervision, slight posterior lean at times with min assist to correct   Standing balance support: Bilateral upper extremity supported, During functional activity, Reliant on assistive device for balance Standing balance-Leahy Scale: Poor                               Pertinent Vitals/Pain Pain Assessment Pain Assessment: 0-10 Pain Score: 9  Pain Location: L foot with standing/gait  Pain Descriptors / Indicators: Discomfort, Grimacing Pain Intervention(s): Monitored during session, Repositioned    Home Living Family/patient expects to be discharged to:: Assisted living                 Home Equipment:  (upright walker)      Prior Function                Mobility Comments: Pt reports he ambulates with upright walker, is able to complete bed mobility without assistance.       Hand Dominance        Extremity/Trunk Assessment   Upper Extremity Assessment Upper Extremity Assessment: Generalized weakness    Lower Extremity Assessment Lower Extremity Assessment: Generalized weakness (BLE edema L>R)       Communication   Communication: HOH  Cognition Arousal/Alertness: Awake/alert Behavior During Therapy: WFL for tasks assessed/performed Overall Cognitive Status: Within Functional Limits for tasks assessed                                          General Comments General comments (skin integrity, edema, etc.): Pt on room air during session with lowest SPO2 of 89%, Max HR 105 bpm. PT educates pt on pursed lip breathing but poor return demo. Pt with congested cough - nurse made aware, also suggested incentive spirometer for pt.    Exercises     Assessment/Plan    PT Assessment Patient needs continued PT services  PT Problem List Decreased strength;Pain;Cardiopulmonary status limiting activity;Decreased activity tolerance;Decreased balance;Decreased mobility;Decreased safety awareness;Decreased knowledge of use of DME       PT Treatment Interventions DME instruction;Therapeutic exercise;Gait training;Balance training;Stair training;Neuromuscular re-education;Functional mobility training;Therapeutic activities;Patient/family education    PT Goals (Current goals can be found in the Care Plan section)  Acute Rehab PT Goals Patient Stated Goal: get better PT Goal Formulation: With patient Time For Goal Achievement: 02/02/23 Potential to Achieve Goals: Good    Frequency Min 3X/week     Co-evaluation               AM-PAC PT "6 Clicks" Mobility  Outcome Measure Help needed turning from your back to your side while in a flat bed without using bedrails?: A Little Help needed moving from lying on your  back to sitting on the side of a flat bed without using bedrails?: A Little Help needed moving to and from a bed to a chair (including a wheelchair)?: A Little Help needed standing up from a chair using your arms (e.g., wheelchair or bedside chair)?: A Little Help needed to walk in hospital room?: Total Help needed climbing 3-5 steps with a railing? : Total 6 Click Score: 14    End of Session Equipment Utilized During Treatment: Gait belt Activity Tolerance: Patient limited by fatigue Patient left: in chair;with chair alarm set;with call bell/phone within reach Nurse Communication: Mobility status (O2) PT Visit Diagnosis: Muscle weakness (generalized) (M62.81);Difficulty in walking, not elsewhere classified (R26.2)    Time: [id number] PT Time Calculation (min) (ACUTE ONLY): 19 min   Charges:   PT Evaluation $PT Eval Moderate Complexity: [place], PT, DPT 01/19/23, 10:33 AM   Waunita Schooner 01/19/2023, 10:32 AM

## 2023-01-19 NOTE — Progress Notes (Signed)
ANTICOAGULATION CONSULT NOTE - Follow-Up  Pharmacy Consult for heparin  Indication: L leg ischemia  Allergies  Allergen Reactions   Brilinta [Ticagrelor] Other (See Comments)    GI Hemorrhage   Coffea Arabica     DECAF coffee causes vision changes    Tetanus Toxoids Other (See Comments)    "Horse serum only" Unknown reaction Documented on MAR    Zocor [Simvastatin] Other (See Comments)    Myalgias    Spiriva Respimat [Tiotropium Bromide Monohydrate] Other (See Comments)    Urinary retention    Niacin Other (See Comments)    Flushing      Patient Measurements: Height: 5' 11"$  (180.3 cm) Weight: 73 kg (160 lb 15 oz) IBW/kg (Calculated) : 75.3 Heparin Dosing Weight: 74.8 kg  Vital Signs: Temp: 97.7 F (36.5 C) (02/16 1136) Temp Source: Oral (02/16 1136) BP: 124/41 (02/16 1136) Pulse Rate: 87 (02/16 1136)  Labs: Recent Labs    01/17/23 1213 01/17/23 2116 01/18/23 0147 01/18/23 0759 01/18/23 1608 01/19/23 0223 01/19/23 0927  HGB 11.8*  --  10.3*  --   --  8.9*  --   HCT 35.3*  --  30.8*  --   --  25.6*  --   PLT 319  --  246  --   --  177  --   APTT 32  --   --   --   --  116*  --   LABPROT 15.7*  --   --   --   --   --   --   INR 1.3*  --   --   --   --   --   --   HEPARINUNFRC  --    < >  --  0.70 0.51  --  0.29*  CREATININE 2.37*  --  2.19*  --   --  1.79*  --    < > = values in this interval not displayed.     Estimated Creatinine Clearance: 30 mL/min (A) (by C-G formula based on SCr of 1.79 mg/dL (H)).   Assessment: 66 YOM with L leg ischemia improving on anticoagulation. No anticoagulant PTA.  Pt has a hx DVT/PE and IVC filter placement after GIB on Eliquis.  Hgb 11.8 > 8.9, plt 319 > 179 over course of admission.  No bleeding noted.  Will continue to monitor.   Heparin level 0.29 (just under goal) on infusion at 900 units/hr.   Goal of Therapy:  Heparin level 0.3-0.7 units/ml Monitor platelets by anticoagulation protocol: Yes   Plan:   Increase heparin infusion to 950 units/hr Daily heparin level and CBC Follow-up plans for oral anticoagulation  Honorio Devol, Pharm.D., BCPS Clinical Pharmacist Clinical phone for 01/19/2023 from 7:30-3:00 is [phone].  **Pharmacist phone directory can be found on [email or web address] listed under [place].  01/19/2023 12:28 PM

## 2023-01-19 NOTE — Progress Notes (Signed)
Patient ID: Marcus Martin, male   DOB: 1933/07/31, 89 y.o.   MRN: [id number]    Progress Note from the Palliative Medicine Team at [facility]   Patient Name: Marcus Martin        Date: 01/19/2023 DOB: [birth year]  Age: 89 y.o. MRN#: [id number] Attending Physician: McDiarmid, Blane Ohara, MD Primary Care Physician: Curly Rim, MD Admit Date: 01/17/2023   Medical records reviewed   89 y.o. male  admitted on 01/17/2023 with a PMH of GERD, COPD, SVT, NSTEMI s/p PCI, HTN, HLD, Afib, DVT/PE s/p IVC filter (no longer on Eliquis) who presented from SNF after waking up today with cold, painful and numb, and blue tinted LLE.    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 After discussion with surgeon decision made for left above the knee amputation.  Surgery was planned for today however surgery was canceled after patient had significant improvement on heparin after 48 hours, mottling and pain in left foot is improved.  Positive pedal pulse     This NP assessed patient at the bedside as a follow up for palliative medicine needs and emotional support.  Currently patient is oriented only to person.  Spoke to patient's son by telephone for ongoing conversation regarding current medical situation.    Plan of care -DNR/DNI -Continue medical management of patient's co-morbidities        -Recommend high dose scheduled Tylenol-1000 mg 3 times daily        -Recommend transition from IV pain medication to oral agents -Ongoing assessment of lower limb ischemia -Family is hopeful for opportunity for short-term rehab at [facility] -Next steps in transition of care will be dependent on patient outcomes.  Patient is high risk for decompensation   Education offered today regarding  the importance of continued conversation with family and their  medical providers regarding overall plan of care and treatment options,  ensuring  decisions are within the context of the patients values and GOCs.  Questions and concerns addressed   Discussed with attending team via secure chat and bedside RN    PMT will continue to support holistically.   Family encouraged to call with questions or concerns  105 minutes   Wadie Lessen NP  Palliative Medicine Team Team Phone # [phone] Pager [phone]

## 2023-01-19 NOTE — Progress Notes (Addendum)
     Daily Progress Note Intern Pager: [phone]  Patient name: Marcus Martin Medical record number: [id number] Date of birth: 06-11-1933 Age: 89 y.o. Gender: male  Primary Care Provider: Curly Rim, MD Consultants: Vascular Surgery Code Status: DNR  Pt Overview and Major Events to Date:  2/14: admitted  Assessment and Plan: Coulton Brenneman is a 89 y.o. male presenting with left leg pain concerning for critical ischemia. Improving with anticoagulation- deferred surgery at this time.    * Lower limb ischemia AKA canceled. Patient endorsed improved pain and function in left foot. This morning left foot is well perfused with cap refill between 2-3s, moving toes, improved pain. -Vascular surgery following, appreciate recommendations -Fall precautions -Dilaudid 0.5 mg every 4 hours as needed -Heparin gtt  AKI (acute kidney injury) ([place]) Cr 1.79  this AM, continues to improve with foley in place. -Continue flomax  -Trend on a.m. BMP -Strict ins and outs  Community acquired pneumonia Asymptomatic, improved. Last 2 doses of Augmentin today (2/16). -Augmentin 500-125 twice daily (last dose today) -Keep O2 sats 88 to 92% -Monitor fever curve  Anemia Hgb down to 8.9. Hx of GI bleed with Eliquis. Currently on heparin drip. We will watch closely. Transfusion threshold 8. -Recheck PM cbc  FEN/GI: Regular  PPx: Heparin Dispo: Pending clinical improvement  Subjective:  Patient reports improvement in pain.   Objective: Temp:  [98.1 F (36.7 C)-99 F (37.2 C)] 98.1 F (36.7 C) (02/16 0842) Pulse Rate:  [77-96] 80 (02/16 0842) Resp:  [14-20] 18 (02/16 0842) BP: (101-138)/(37-56) 135/50 (02/16 0842) SpO2:  [93 %-97 %] 95 % (02/16 0842) Weight:  [73 kg] 73 kg (02/16 0500) Physical Exam: General: NAD, chronically ill-appearing Cardiovascular: RRR, no murmurs Respiratory: CTAB, normal wob on RA Extremities: Left extremity warm and dry, cap refill 2-3s, difficult to  palpate DP, moves toes, trace non-pitting edema.   Laboratory: Most recent CBC Lab Results  Component Value Date   WBC 12.0 (H) 01/19/2023   HGB 8.9 (L) 01/19/2023   HCT 25.6 (L) 01/19/2023   MCV 91.1 01/19/2023   PLT 177 01/19/2023   Most recent BMP    Latest Ref Rng & Units 01/19/2023    2:23 AM  BMP  Glucose 70 - 99 mg/dL 96   BUN 8 - 23 mg/dL 54   Creatinine 0.61 - 1.24 mg/dL 1.79   Sodium 135 - 145 mmol/L 130   Potassium 3.5 - 5.1 mmol/L 4.1   Chloride 98 - 111 mmol/L 107   CO2 22 - 32 mmol/L 16   Calcium 8.9 - 10.3 mg/dL 7.2    Leslie Dales, DO 01/19/2023, 9:03 AM  PGY-1, [place] Intern pager: [phone], text pages welcome Secure chat group [place]

## 2023-01-19 NOTE — Progress Notes (Signed)
Mobility Specialist Progress Note:   01/19/23 1546  Mobility  Activity Stood at bedside  Level of Assistance Minimal assist, patient does 75% or more  Assistive Device Front wheel walker  Activity Response Tolerated fair  Mobility Referral Yes  $Mobility charge 1 Mobility   Pt received in bed and agreeable. C/o fatigue throughout session. Pt stood EOB with MinA for balance d/t posterior lean and then sat EOB for ~49mn. Pt returned to supine with all needs met, call bell in reach, and family in room.   KAndrey CampanileMobility Specialist Please contact via SecureChat or  Rehab office at [phone]

## 2023-01-19 NOTE — Progress Notes (Signed)
[facility] [place]  Subjective  -states he is having some mild pain in his left foot   Objective (!) 124/41 87 97.7 F (36.5 C) (Oral) 18 95%  Intake/Output Summary (Last 24 hours) at 01/19/2023 1205 Last data filed at 01/19/2023 1000 Gross per 24 hour  Intake 1046.12 ml  Output 1370 ml  Net -323.88 ml    Left foot mottling improved Left PT signal Left foot motor improved  Laboratory Lab Results: Recent Labs    01/18/23 0147 01/19/23 0223  WBC 14.3* 12.0*  HGB 10.3* 8.9*  HCT 30.8* 25.6*  PLT 246 177   BMET Recent Labs    01/18/23 0147 01/19/23 0223  NA 129* 130*  K 4.9 4.1  CL 101 107  CO2 16* 16*  GLUCOSE 96 96  BUN 67* 54*  CREATININE 2.19* 1.79*  CALCIUM 7.5* 7.2*    COAG Lab Results  Component Value Date   INR 1.3 (H) 01/17/2023   INR 2.7 (H) 11/17/2021   INR 2.1 (H) 11/16/2021   No results found for: "PTT"  Assessment/Planning:  89 year old male seen with acute on chronic ischemia on Wednesday in the ED.  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  Given extensive conversation about his age and comorbidities and previous DNR/DNI we had planned for left above-knee amputation today with spinal as he was having 10/10 pain in the foot.  This was canceled as he had significant improvement on heparin after 48 hours and the mottling and pain have improved.  Again his exam looks much better.  He does have a Doppler signal in his foot now.  I think he would need to be transitioned to some oral anticoagulant at discharge and see if he can tolerate his level of ischemia and I can follow him as an outpatient.  Marcus Martin 01/19/2023 12:05 PM --

## 2023-01-19 NOTE — Progress Notes (Signed)
Pt stable and tolerating room air with no distress noted. Pt refusing CPAP QHS at this time. Will continue to monitor

## 2023-01-20 DIAGNOSIS — N179 Acute kidney failure, unspecified: Secondary | ICD-10-CM | POA: Diagnosis not present

## 2023-01-20 DIAGNOSIS — D649 Anemia, unspecified: Secondary | ICD-10-CM | POA: Diagnosis not present

## 2023-01-20 DIAGNOSIS — I998 Other disorder of circulatory system: Secondary | ICD-10-CM | POA: Diagnosis not present

## 2023-01-20 DIAGNOSIS — R339 Retention of urine, unspecified: Secondary | ICD-10-CM | POA: Diagnosis not present

## 2023-01-20 LAB — HEPARIN LEVEL (UNFRACTIONATED): Heparin Unfractionated: 0.3 IU/mL (ref 0.30–0.70)

## 2023-01-20 LAB — CBC
HCT: 28.1 % — ABNORMAL LOW (ref 39.0–52.0)
Hemoglobin: 9.5 g/dL — ABNORMAL LOW (ref 13.0–17.0)
MCH: 31 pg (ref 26.0–34.0)
MCHC: 33.8 g/dL (ref 30.0–36.0)
MCV: 91.8 fL (ref 80.0–100.0)
Platelets: 195 10*3/uL (ref 150–400)
RBC: 3.06 MIL/uL — ABNORMAL LOW (ref 4.22–5.81)
RDW: 13.8 % (ref 11.5–15.5)
WBC: 10.4 10*3/uL (ref 4.0–10.5)
nRBC: 0 % (ref 0.0–0.2)

## 2023-01-20 LAB — BASIC METABOLIC PANEL
Anion gap: 7 (ref 5–15)
BUN: 34 mg/dL — ABNORMAL HIGH (ref 8–23)
CO2: 17 mmol/L — ABNORMAL LOW (ref 22–32)
Calcium: 7.5 mg/dL — ABNORMAL LOW (ref 8.9–10.3)
Chloride: 106 mmol/L (ref 98–111)
Creatinine, Ser: 1.25 mg/dL — ABNORMAL HIGH (ref 0.61–1.24)
GFR, Estimated: 56 mL/min — ABNORMAL LOW (ref >60)
Glucose, Bld: 106 mg/dL — ABNORMAL HIGH (ref 70–99)
Potassium: 4.1 mmol/L (ref 3.5–5.1)
Sodium: 130 mmol/L — ABNORMAL LOW (ref 135–145)

## 2023-01-20 MED ORDER — APIXABAN 5 MG PO TABS
5.0000 mg | ORAL_TABLET | Freq: Two times a day (BID) | ORAL | Status: DC
  Administered 2023-01-20 – 2023-01-23 (×7): 5 mg via ORAL
  Filled 2023-01-20 (×7): qty 1

## 2023-01-20 MED ORDER — ACETAMINOPHEN 325 MG PO TABS
650.0000 mg | ORAL_TABLET | Freq: Four times a day (QID) | ORAL | Status: DC | PRN
  Administered 2023-01-21 – 2023-01-23 (×2): 650 mg via ORAL
  Filled 2023-01-20 (×2): qty 2

## 2023-01-20 NOTE — Evaluation (Signed)
Occupational Therapy Evaluation Patient Details Name: Marcus Martin MRN: [id number] DOB: [birth year] Today's Date: 01/20/2023   History of Present Illness Pt is an 89 y/o M admitted on 01/17/23 after presenting with LLE pain concerning for critical ischemia. Pt initially opted for L AKA but sx was cancelled after improvement following IV heparin. PMH: B hearing loss, COPD, GERD, HLD, HTN, NSTEMI, OSA, peripheral neuropathy   Clinical Impression   Pt admitted for concerns listed above. PTA pt reported that he has been living at an ALF, where he would like to return. He reports he has been receiving assist for all ADL's due to weakness and poor balance. At this time he reports that pain is minimal in his LLE. He is requiring min-mod A for all functional mobility and min-mod A for his ADL's. Recommending he return to his ALF and have therapy there, however if they are unable to provide mod assist for ADL's and mobility, recommend he go to a SNF before returning to ALF. OT will continue following acutely.       Recommendations for follow up therapy are one component of a multi-disciplinary discharge planning process, led by the attending physician.  Recommendations may be updated based on patient status, additional functional criteria and insurance authorization.   Follow Up Recommendations  Home health OT     Assistance Recommended at Discharge Frequent or constant Supervision/Assistance  Patient can return home with the following A little help with walking and/or transfers;A lot of help with bathing/dressing/bathroom;Assistance with cooking/housework;Direct supervision/assist for medications management;Help with stairs or ramp for entrance    Functional Status Assessment  Patient has had a recent decline in their functional status and demonstrates the ability to make significant improvements in function in a reasonable and predictable amount of time.  Equipment Recommendations  None recommended  by OT    Recommendations for Other Services       Precautions / Restrictions Precautions Precautions: Fall Restrictions Weight Bearing Restrictions: No      Mobility Bed Mobility Overal bed mobility: Modified Independent             General bed mobility comments: No assist needed to come to sitting or return to supine    Transfers Overall transfer level: Needs assistance Equipment used: Rolling walker (2 wheels) Transfers: Sit to/from Stand Sit to Stand: Min assist           General transfer comment: Min A to power up to standing, able to take steps with min guard      Balance Overall balance assessment: Needs assistance Sitting-balance support: Feet supported Sitting balance-Leahy Scale: Good     Standing balance support: Bilateral upper extremity supported, During functional activity, Reliant on assistive device for balance Standing balance-Leahy Scale: Poor Standing balance comment: reliant on RW                           ADL either performed or assessed with clinical judgement   ADL Overall ADL's : Needs assistance/impaired Eating/Feeding: Set up;Sitting   Grooming: Min guard;Standing   Upper Body Bathing: Set up;Sitting   Lower Body Bathing: Moderate assistance;Sitting/lateral leans;Sit to/from stand   Upper Body Dressing : Minimal assistance;Sitting   Lower Body Dressing: Moderate assistance;Sitting/lateral leans;Sit to/from stand   Toilet Transfer: Minimal assistance;Stand-pivot   Toileting- Clothing Manipulation and Hygiene: Moderate assistance;Sitting/lateral lean;Sit to/from stand       Functional mobility during ADLs: Minimal assistance;Rolling walker (2 wheels) General ADL Comments: Pt overall  weaker than baseline, with decreased activity tolerance     Vision Baseline Vision/History: 1 Wears glasses Ability to See in Adequate Light: 1 Impaired Patient Visual Report: No change from baseline Vision Assessment?: No  apparent visual deficits     Perception Perception Perception Tested?: No   Praxis Praxis Praxis tested?: Not tested    Pertinent Vitals/Pain Pain Assessment Pain Assessment: No/denies pain     Hand Dominance Right   Extremity/Trunk Assessment Upper Extremity Assessment Upper Extremity Assessment: Generalized weakness   Lower Extremity Assessment Lower Extremity Assessment: Defer to PT evaluation   Cervical / Trunk Assessment Cervical / Trunk Assessment: Kyphotic   Communication Communication Communication: HOH   Cognition Arousal/Alertness: Awake/alert Behavior During Therapy: WFL for tasks assessed/performed Overall Cognitive Status: Within Functional Limits for tasks assessed                                       General Comments  VSS on 2L    Exercises     Shoulder Instructions      Home Living Family/patient expects to be discharged to:: Assisted living                             Home Equipment: Other (comment) (Stand up walker)          Prior Functioning/Environment Prior Level of Function : Needs assist             Mobility Comments: Pt reports he ambulates with upright walker, is able to complete bed mobility without assistance. ADLs Comments: Pt reports that ALF assists hime with all ADL's and IADL's./        OT Problem List: Decreased strength;Impaired balance (sitting and/or standing);Decreased activity tolerance;Decreased cognition;Decreased safety awareness;Cardiopulmonary status limiting activity;Impaired sensation;Pain      OT Treatment/Interventions: Self-care/ADL training;Therapeutic exercise;Energy conservation;DME and/or AE instruction;Therapeutic activities;Patient/family education;Balance training    OT Goals(Current goals can be found in the care plan section) Acute Rehab OT Goals Patient Stated Goal: To go back to ALF OT Goal Formulation: With patient Time For Goal Achievement:  02/03/23 Potential to Achieve Goals: Good ADL Goals Pt Will Perform Grooming: with supervision;standing Pt Will Perform Lower Body Bathing: with min assist;sit to/from stand;sitting/lateral leans Pt Will Perform Lower Body Dressing: with min assist;sitting/lateral leans;sit to/from stand Pt Will Transfer to Toilet: with min assist;ambulating Pt Will Perform Toileting - Clothing Manipulation and hygiene: with supervision;sitting/lateral leans;sit to/from stand  OT Frequency: Min 2X/week    Co-evaluation              AM-PAC OT "6 Clicks" Daily Activity     Outcome Measure Help from another person eating meals?: A Little Help from another person taking care of personal grooming?: A Little Help from another person toileting, which includes using toliet, bedpan, or urinal?: A Lot Help from another person bathing (including washing, rinsing, drying)?: A Lot Help from another person to put on and taking off regular upper body clothing?: A Little Help from another person to put on and taking off regular lower body clothing?: A Lot 6 Click Score: 15   End of Session Equipment Utilized During Treatment: Rolling walker (2 wheels);Oxygen Nurse Communication: Mobility status  Activity Tolerance: Patient tolerated treatment well Patient left: in bed;with call bell/phone within reach;with bed alarm set  OT Visit Diagnosis: Unsteadiness on feet (R26.81);Other abnormalities of gait and mobility (  R26.89);Muscle weakness (generalized) (M62.81)                Time: [id number] OT Time Calculation (min): 15 min Charges:  OT General Charges $OT Visit: 1 Visit OT Evaluation $OT Eval Moderate Complexity: [place], OTR/L [place] Acute Rehab  Arva Slaugh Elane Yolanda Bonine 01/20/2023, 1:33 PM

## 2023-01-20 NOTE — Progress Notes (Addendum)
[facility] [place]:   ISCHEMIC LEFT LOWER EXTREMITY: He had presented with a concern for ischemia of the left lower extremity.  This improved significantly during the course of his hospitalization.  Currently both feet are warm and well-perfused.  Vascular surgery can follow his peripheral arterial disease as an outpatient.  Dr. Carlis Abbott has recommended that he be on a DOAC given his history.  Gae Gallop, MD 8:30 AM   Subjective  - Doing well and improved rest pain in the left LE  Objective (!) 133/52 70 98.1 F (36.7 C) (Oral) 15 98%  Intake/Output Summary (Last 24 hours) at 01/20/2023 0709 Last data filed at 01/20/2023 0347 Gross per 24 hour  Intake 421.86 ml  Output 1850 ml  Net -1428.14 ml     No ischemic skin changes Positive edema Left PT signal Motor intact Lungs productive cough   Assessment/Planning: Left LE acute on Chronic ischemia rest pain.   Anticoagulation seems to have improved his inflow.  AKA cancelled and he will be transitioned to Eliquis oral.   F/U with [facility] 2-3 weeks with ABI will be scheduled  Improved leukocytosis 10.4 Improved Cr now 1.25 Plans for DOAC to start today   Roxy Horseman 01/20/2023 7:09 AM --  Laboratory Lab Results: Recent Labs    01/19/23 1545 01/20/23 0120  WBC 12.4* 10.4  HGB 10.6* 9.5*  HCT 31.3* 28.1*  PLT 205 195   BMET Recent Labs    01/19/23 0223 01/20/23 0120  NA 130* 130*  K 4.1 4.1  CL 107 106  CO2 16* 17*  GLUCOSE 96 106*  BUN 54* 34*  CREATININE 1.79* 1.25*  CALCIUM 7.2* 7.5*    COAG Lab Results  Component Value Date   INR 1.3 (H) 01/17/2023   INR 2.7 (H) 11/17/2021   INR 2.1 (H) 11/16/2021   No results found for: "PTT"  I have interviewed the patient and examined the patient. I agree with the findings by the PA.   Gae Gallop, MD

## 2023-01-20 NOTE — Progress Notes (Signed)
     Daily Progress Note Intern Pager: [phone]  Patient name: Marcus Martin Medical record number: [id number] Date of birth: 13-Sep-1933 Age: 89 y.o. Gender: male  Primary Care Provider: Curly Rim, MD Consultants: Vascular surgery  Code Status: DNR  Pt Overview and Major Events to Date:  2/14: admitted, started on heparin  2/17: Transitioned to eliquis from heparin   Assessment and Plan: Marcus Martin is a 89 y.o. male presenting with left leg pain concerning for critical ischemia. Improving with anticoagulation- deferred surgery at this time  Will transition to po anticoagulation today.    * Lower limb ischemia AKA canceled as patient had improved pain and doppler signal. This morning left foot is well perfused with cap refill between 2-3s, moving toes, improved pain. - Vascular surgery following, appreciate recommendations - Fall precautions - Dilaudid 0.5 mg every 4 hours as needed - Transition from heparin gtt to eliquis  Anemia Hgb this AM up to 9.5. Hx of GI bleed with Eliquis. Currently on heparin drip.  - Continue to monitor for signs or symptoms of GI bleed - Transfusion threshold 8. - AM CBC   AKI (acute kidney injury) ([place]) Cr 1.25  this AM, continues to improve with foley in place. Baseline Cr ~ 1.0  -Continue flomax  -Trend on a.m. BMP -Strict ins and outs  Community acquired pneumonia Afebrile. On 2L of oxygen overnight. However, patient has history of OSA with CPAP need and declined CPAP. Unlikely due to pneumonia.  - S/p augmentin  -Keep O2 sats 88 to 92% -Monitor fever curve    FEN/GI: Regular  PPx: On therapeutic heparin will transition to eliquis today  Dispo: SNF pending placement.   Subjective:  Patient says he does not have shortness of breath or that it is harder to breathe now. He says breathes hard at night. Says his leg pain has improved.   Objective: Temp:  [97.7 F (36.5 C)-98.5 F (36.9 C)] 98.1 F (36.7 C) (02/17  0330) Pulse Rate:  [70-87] 70 (02/17 0330) Resp:  [15-20] 15 (02/17 0330) BP: (124-145)/(41-61) 133/52 (02/17 0330) SpO2:  [94 %-98 %] 98 % (02/17 0330) Weight:  [73 kg] 73 kg (02/16 0913) Physical Exam: General: Chronically ill appearing, lying in bed, hard of hearing  Cardiovascular: RRR, No M/r/g, dorsal pedal pulses equal, both extremities appropriately warm  Respiratory: No increased work of breathing on 2 L, No rhonci or rales, wet cough  Abdomen: Soft, non distended, non tender    Laboratory: Most recent CBC Lab Results  Component Value Date   WBC 10.4 01/20/2023   HGB 9.5 (L) 01/20/2023   HCT 28.1 (L) 01/20/2023   MCV 91.8 01/20/2023   PLT 195 01/20/2023   Most recent BMP    Latest Ref Rng & Units 01/20/2023    1:20 AM  BMP  Glucose 70 - 99 mg/dL 106   BUN 8 - 23 mg/dL 34   Creatinine 0.61 - 1.24 mg/dL 1.25   Sodium 135 - 145 mmol/L 130   Potassium 3.5 - 5.1 mmol/L 4.1   Chloride 98 - 111 mmol/L 106   CO2 22 - 32 mmol/L 17   Calcium 8.9 - 10.3 mg/dL 7.5     Lowry Ram, MD 01/20/2023, 6:16 AM  PGY-1, [place] Intern pager: [phone], text pages welcome Secure chat group [place]

## 2023-01-20 NOTE — Progress Notes (Signed)
Mobility Specialist Progress Note:   01/20/23 [id number]  Mobility  Activity Stood at bedside;Transferred to/from [facility]  Level of Assistance Minimal assist, patient does 75% or more  Assistive Device Front wheel walker  Distance Ambulated (ft) 2 ft  Activity Response Tolerated well  Mobility Referral Yes  $Mobility charge 1 Mobility   Pt received in bed and agreeable. Pt stood EOB 3 times with MinA for ~16mn each time. [facility]. C/o fatigue at end of session. Pt returned to bed with all needs met, call bell in reach, and NT in room.   KAndrey CampanileMobility Specialist Please contact via SecureChat or  Rehab office at [phone]

## 2023-01-20 NOTE — Assessment & Plan Note (Signed)
Stable. Hx of GI bleed with Eliquis.  - Continue eliquis for lower limb ischemia.  - Continue to monitor for signs or symptoms of GI bleed - Transfusion threshold 8. - AM CBC

## 2023-01-20 NOTE — Progress Notes (Signed)
Called and spoke with Marcus Martin and confirmed DOB. Discussed we are monitoring blood levels while on DOAC and plan for voiding trial tomorrow. I will tocuh base with social work to see what the situation of transferring to SNF [facility] would be.

## 2023-01-21 DIAGNOSIS — I998 Other disorder of circulatory system: Secondary | ICD-10-CM | POA: Diagnosis not present

## 2023-01-21 LAB — CBC
HCT: 27.2 % — ABNORMAL LOW (ref 39.0–52.0)
Hemoglobin: 9.2 g/dL — ABNORMAL LOW (ref 13.0–17.0)
MCH: 31 pg (ref 26.0–34.0)
MCHC: 33.8 g/dL (ref 30.0–36.0)
MCV: 91.6 fL (ref 80.0–100.0)
Platelets: 182 10*3/uL (ref 150–400)
RBC: 2.97 MIL/uL — ABNORMAL LOW (ref 4.22–5.81)
RDW: 13.8 % (ref 11.5–15.5)
WBC: 7.7 10*3/uL (ref 4.0–10.5)
nRBC: 0 % (ref 0.0–0.2)

## 2023-01-21 LAB — BASIC METABOLIC PANEL
Anion gap: 5 (ref 5–15)
BUN: 25 mg/dL — ABNORMAL HIGH (ref 8–23)
CO2: 19 mmol/L — ABNORMAL LOW (ref 22–32)
Calcium: 7.5 mg/dL — ABNORMAL LOW (ref 8.9–10.3)
Chloride: 106 mmol/L (ref 98–111)
Creatinine, Ser: 1.11 mg/dL (ref 0.61–1.24)
GFR, Estimated: >60 mL/min (ref >60)
Glucose, Bld: 115 mg/dL — ABNORMAL HIGH (ref 70–99)
Potassium: 4 mmol/L (ref 3.5–5.1)
Sodium: 130 mmol/L — ABNORMAL LOW (ref 135–145)

## 2023-01-21 NOTE — Progress Notes (Addendum)
Brief Note: Spoke to patient's son who had questions about patient's current condition and next steps.  During this conversation, I explained in simple terms the patient's health condition, answered questions pertaining to the patient's current treatment and provided updates, and outlined the treatment plan moving forward.  Camelia Phenes, MD 2/18/20244:20 PM

## 2023-01-21 NOTE — Assessment & Plan Note (Addendum)
Resolved.  - Continuing flomax 0.8 mg

## 2023-01-21 NOTE — Progress Notes (Addendum)
[facility] [place]  Subjective  - No new complaints  Objective (!) 152/59 77 98.6 F (37 C) (Oral) 20 97%  Intake/Output Summary (Last 24 hours) at 01/21/2023 [id number] Last data filed at 01/21/2023 0308 Gross per 24 hour  Intake 1080 ml  Output 1825 ml  Net -745 ml   B Feet are warm  Motor intact  Lungs non labored breathing  Assessment/Planning: Left LE acute on Chronic ischemia rest pain.   Anticoagulation seems to have improved his inflow.  AKA cancelled and he will be  Eliquis was started 01/20/23 and is tolerated.   F/U with [facility] 2-3 weeks with ABI will be scheduled.  We will follow as out patient.  Call with any concerns. Leukocytosis improved and now WNL 7.7  Marcus Martin 01/21/2023 7:02 AM --  Laboratory Lab Results: Recent Labs    01/20/23 0120 01/21/23 0115  WBC 10.4 7.7  HGB 9.5* 9.2*  HCT 28.1* 27.2*  PLT 195 182   BMET Recent Labs    01/20/23 0120 01/21/23 0115  NA 130* 130*  K 4.1 4.0  CL 106 106  CO2 17* 19*  GLUCOSE 106* 115*  BUN 34* 25*  CREATININE 1.25* 1.11  CALCIUM 7.5* 7.5*    COAG Lab Results  Component Value Date   INR 1.3 (H) 01/17/2023   INR 2.7 (H) 11/17/2021   INR 2.1 (H) 11/16/2021   No results found for: "PTT"  I have interviewed the patient and examined the patient. I agree with the findings by the PA.  Both feet are warm and well-perfused.  He is now on a DOAC.  Follow-up has been arranged in our office.  Gae Gallop, MD

## 2023-01-21 NOTE — TOC Initial Note (Addendum)
Transition of Care [facility]) - Initial/Assessment Note    Patient Details  Name: Marcus Martin MRN: [id number] Date of Birth: Apr 16, 1933  Transition of Care [facility]) CM/SW Contact:    Milas Gain, [place] Phone Number: 01/21/2023, 11:06 AM  Clinical Narrative:                  CSW received consult for possible SNF placement at time of discharge. CSW spoke with patient regarding PT recommendation of SNF placement at time of discharge. Patient reports PTA that he came from [place].Patient expressed understanding of PT recommendation and is agreeable to SNF placement at time of discharge. Patient reports preference to go to [facility] short term rehab . Patient confirmed he has cpap at ALF that can be brought to rehab.Patient gave CSW permission to call his son Daren to further discuss his dc plan. CSW called patients son Daren who confirmed PTA patient came from [place]. Patients son Inetta Fermo also in agreement for patient to [facility]. CSW discussed insurance authorization process.Daren gave CSW permission to use patients UHC benefit for short term rehab. Daren is going to bring a copy of insurance card to hospital. No further questions reported at this time. CSW to continue to follow and assist with discharge planning needs.   Patients son called CSW and provided CSW with patients [facility] member ID# [id number]. Patients son is going to bring copy of card to hospital.   Expected Discharge Plan: Skilled Nursing Facility Barriers to Discharge: Continued Medical Work up   Patient Goals and CMS Choice Patient states their goals for this hospitalization and ongoing recovery are:: SNF CMS Medicare.gov Compare Post Acute Care list provided to:: Patient Choice offered to / list presented to : Patient      Expected Discharge Plan and Services In-house Referral: Clinical Social Work                                            Prior Living  Arrangements/Services   Lives with:: Facility Resident (From [facility]) Patient language and need for interpreter reviewed:: Yes Do you feel safe going back to the place where you live?: No   SNF  Need for Family Participation in Patient Care: Yes (Comment) Care giver support system in place?: Yes (comment)   Criminal Activity/Legal Involvement Pertinent to Current Situation/Hospitalization: No - Comment as needed  Activities of Daily Living Home Assistive Devices/Equipment: CPAP, Eyeglasses, Hearing aid, Walker (specify type) ADL Screening (condition at time of admission) Patient's cognitive ability adequate to safely complete daily activities?: Yes Is the patient deaf or have difficulty hearing?: Yes Does the patient have difficulty seeing, even when wearing glasses/contacts?: No Does the patient have difficulty concentrating, remembering, or making decisions?: No Patient able to express need for assistance with ADLs?: Yes Does the patient have difficulty dressing or bathing?: Yes Independently performs ADLs?: No Communication: Independent Dressing (OT): Needs assistance Is this a change from baseline?: Pre-admission baseline Grooming: Needs assistance Is this a change from baseline?: Pre-admission baseline Feeding: Needs assistance Is this a change from baseline?: Pre-admission baseline Bathing: Needs assistance Is this a change from baseline?: Pre-admission baseline Toileting: Needs assistance Is this a change from baseline?: Pre-admission baseline In/Out Bed: Needs assistance Is this a change from baseline?: Pre-admission baseline Walks in Home: Needs assistance Is this a change from baseline?: Pre-admission baseline Does  the patient have difficulty walking or climbing stairs?: Yes Weakness of Legs: Both Weakness of Arms/Hands: Both  Permission Sought/Granted Permission sought to share information with : Case Manager, Family Supports, Research scientist (medical) granted to share information with : Yes, Verbal Permission Granted  Share Information with NAME: Daren  Permission granted to share info w AGENCY: SNF  Permission granted to share info w Relationship: Son  Permission granted to share info w Contact Information: Inetta Fermo [phone]  Emotional Assessment   Attitude/Demeanor/Rapport: Gracious Affect (typically observed): Calm Orientation: : Oriented to Self, Oriented to Place, Oriented to  Time, Oriented to Situation Alcohol / Substance Use: Not Applicable Psych Involvement: No (comment)  Admission diagnosis:  Lower limb ischemia [I99.8] Patient Active Problem List   Diagnosis Date Noted   Anemia 01/20/2023   Urinary retention 01/18/2023   Lower limb ischemia 01/17/2023   AKI (acute kidney injury) ([place]) 01/17/2023   Community acquired pneumonia 01/09/2023   COPD exacerbation ([place]) 01/09/2023   Malnutrition of moderate degree 11/19/2021   Cholelithiasis without cholecystitis 11/17/2021   Aortic atherosclerosis ([place]) 11/17/2021   Acute sore throat 11/17/2021   Altered mental status 11/16/2021   SIADH (syndrome of inappropriate ADH production) ([place])    Gastroesophageal reflux disease with esophagitis without hemorrhage    Acute hypoxic respiratory failure ([place])    Abdominal distention    Fall    Hyponatremia    Acute encephalopathy 10/31/2021   PCP:  Curly Rim, MD Pharmacy:   [place], [place] [facility] [place] [address] [place] [address] Phone: [phone] Fax: [phone]  [facility] [address] [place] [address] Phone: [phone] Fax: [phone]     Social Determinants of Health (SDOH) Social History: SDOH Screenings   Food Insecurity: No Food Insecurity (01/18/2023)  Housing: Low Risk  (01/18/2023)  Transportation Needs: No Transportation Needs (01/18/2023)   Utilities: Not At Risk (01/18/2023)  Tobacco Use: Medium Risk (01/17/2023)   SDOH Interventions:     Readmission Risk Interventions     No data to display

## 2023-01-21 NOTE — NC FL2 (Signed)
[place] MEDICAID FL2 LEVEL OF CARE FORM     IDENTIFICATION  Patient Name: Marcus Martin Birthdate: 13-Apr-1933 Sex: male Admission Date (Current Location): 01/17/2023  [facility] and [place] Number:  Herbalist and Address:  [facility] [place] [place] [address] [place], [place] [address]      Provider Number: [id number]  Attending Physician Name and Address:  McDiarmid, Blane Ohara, MD  Relative Name and Phone Number:  Inetta Fermo (Son) [phone]    Current Level of Care: Hospital Recommended Level of Care: [place] Prior Approval Number:    Date Approved/Denied:   PASRR Number: [id number]  Discharge Plan: SNF    Current Diagnoses: Patient Active Problem List   Diagnosis Date Noted   Anemia 01/20/2023   Urinary retention 01/18/2023   Lower limb ischemia 01/17/2023   AKI (acute kidney injury) ([place]) 01/17/2023   Community acquired pneumonia 01/09/2023   COPD exacerbation ([place]) 01/09/2023   Malnutrition of moderate degree 11/19/2021   Cholelithiasis without cholecystitis 11/17/2021   Aortic atherosclerosis ([place]) 11/17/2021   Acute sore throat 11/17/2021   Altered mental status 11/16/2021   SIADH (syndrome of inappropriate ADH production) ([place])    Gastroesophageal reflux disease with esophagitis without hemorrhage    Acute hypoxic respiratory failure ([facility])    Abdominal distention    Fall    Hyponatremia    Acute encephalopathy 10/31/2021    Orientation RESPIRATION BLADDER Height & Weight     Self, Time, Situation, Place  Normal Continent Weight: 169 lb 8.5 oz (76.9 kg) Height:  5' 11"$  (180.3 cm)  BEHAVIORAL SYMPTOMS/MOOD NEUROLOGICAL BOWEL NUTRITION STATUS      Continent Diet (Please see dsicharge summary)  AMBULATORY STATUS COMMUNICATION OF NEEDS Skin   Limited Assist Verbally Other (Comment) (Ecchymosis,arm,hand,back,flank,groin,bil.,R,L,Erythema,arm,back,hip,upper,R,L,Abrasion,Elbow,R,Wound/Incision LDAs)                        Personal Care Assistance Level of Assistance  Bathing, Feeding, Dressing Bathing Assistance: Limited assistance Feeding assistance: Independent Dressing Assistance: Limited assistance     Functional Limitations Info  Sight, Hearing, Speech Sight Info: Impaired Hearing Info: Impaired Speech Info: Adequate    SPECIAL CARE FACTORS FREQUENCY  PT (By licensed PT), OT (By licensed OT)     PT Frequency: 5x min weekly OT Frequency: 5x min weekly            Contractures Contractures Info: Not present    Additional Factors Info  Code Status, Allergies, Psychotropic Code Status Info: DNR Allergies Info: Brilinta (ticagrelor),Brilinta (ticagrelor),Tetanus Toxoids,Zocor (simvastatin),Spiriva Respimat (tiotropium Bromide Monohydrate,Niacin Psychotropic Info: traZODone (DESYREL) tablet 25 mg daily at bedtime         Current Medications (01/21/2023):  This is the current hospital active medication list Current Facility-Administered Medications  Medication Dose Route Frequency Provider Last Rate Last Admin   acetaminophen (TYLENOL) tablet 650 mg  650 mg Oral Q6H PRN Gerrit Heck, MD   650 mg at 01/21/23 1037   albuterol (PROVENTIL) (2.5 MG/3ML) 0.083% nebulizer solution 2.5 mg  2.5 mg Inhalation Q6H PRN Gerrit Heck, MD   2.5 mg at 01/20/23 1600   apixaban (ELIQUIS) tablet 5 mg  5 mg Oral BID Lowry Ram, MD   5 mg at 01/21/23 0820   aspirin EC tablet 81 mg  81 mg Oral Daily Gerrit Heck, MD   81 mg at 01/21/23 0820   atorvastatin (LIPITOR) tablet 20 mg  20 mg Oral QPM Gerrit Heck, MD   20 mg at  01/20/23 1737   brinzolamide (AZOPT) 1 % ophthalmic suspension 1 drop  1 drop Both Eyes TID Gerrit Heck, MD   1 drop at 01/21/23 [id number]   And   brimonidine (ALPHAGAN) 0.2 % ophthalmic solution 1 drop  1 drop Both Eyes TID Gerrit Heck, MD   1 drop at 01/21/23 0825   Chlorhexidine Gluconate Cloth 2 % PADS 6 each  6 each Topical Q0600 McDiarmid, Blane Ohara, MD   6  each at 01/21/23 0543   docusate sodium (COLACE) capsule 100 mg  100 mg Oral BID Gerrit Heck, MD   100 mg at 01/21/23 0820   ferrous sulfate tablet 325 mg  325 mg Oral Q M,W,F Gerrit Heck, MD   325 mg at 01/19/23 0845   fluticasone furoate-vilanterol (BREO ELLIPTA) 200-25 MCG/ACT 1 puff  1 puff Inhalation Daily Gerrit Heck, MD   1 puff at 01/21/23 0815   gabapentin (NEURONTIN) capsule 300 mg  300 mg Oral BID Gerrit Heck, MD   300 mg at 01/21/23 0820   guaiFENesin (MUCINEX) 12 hr tablet 600 mg  600 mg Oral BID PRN Gerrit Heck, MD   600 mg at 01/20/23 1553   latanoprost (XALATAN) 0.005 % ophthalmic solution 1 drop  1 drop Both Eyes QHS Gerrit Heck, MD   1 drop at 01/20/23 2143   nystatin (MYCOSTATIN/NYSTOP) topical powder 1 Application  1 Application Topical BID PRN Gerrit Heck, MD       pantoprazole (PROTONIX) EC tablet 40 mg  40 mg Oral BID Gerrit Heck, MD   40 mg at 01/21/23 0820   polyvinyl alcohol (LIQUIFILM TEARS) 1.4 % ophthalmic solution 1 drop  1 drop Both Eyes TID PRN Gerrit Heck, MD       sodium chloride tablet 1 g  1 g Oral TID WC Gerrit Heck, MD   1 g at 01/21/23 0820   tamsulosin (FLOMAX) capsule 0.8 mg  0.8 mg Oral QHS Arlyce Dice, MD   0.8 mg at 01/20/23 2129   traZODone (DESYREL) tablet 25 mg  25 mg Oral QHS Gerrit Heck, MD   25 mg at 01/20/23 2129   umeclidinium bromide (INCRUSE ELLIPTA) 62.5 MCG/ACT 1 puff  1 puff Inhalation Daily Gerrit Heck, MD   1 puff at 01/21/23 0814   zinc oxide 20 % ointment 1 Application  1 Application Topical Daily PRN Gerrit Heck, MD         Discharge Medications: Please see discharge summary for a list of discharge medications.  Relevant Imaging Results:  Relevant Lab Results:   Additional Information [id number]  [place] Gain, LCSWA

## 2023-01-21 NOTE — TOC Progression Note (Signed)
Transition of Care [facility]) - Progression Note    Patient Details  Name: Marcus Martin MRN: [id number] Date of Birth: 1933/03/03  Transition of Care [facility]) CM/SW [place], LCSW Phone Number: 01/21/2023, 4:38 PM  Clinical Narrative:     CSW received a call for pt's son inquiring about bed offer from [facility]. CSW explained that referral had been sent today and bed offer is not back yet. CSW checked and pt is not on EDD board for tomorrow in regards to authorization.   TOC team will continue to assist with discharge planning needs.   Expected Discharge Plan: [place] Barriers to Discharge: Continued Medical Work up  Expected Discharge Plan and Services In-house Referral: Clinical Social Work                                             Social Determinants of Health (SDOH) Interventions SDOH Screenings   Food Insecurity: No Food Insecurity (01/18/2023)  Housing: Low Risk  (01/18/2023)  Transportation Needs: No Transportation Needs (01/18/2023)  Utilities: Not At Risk (01/18/2023)  Tobacco Use: Medium Risk (01/17/2023)    Readmission Risk Interventions   No data to display

## 2023-01-21 NOTE — Progress Notes (Signed)
Mobility Specialist Progress Note:   01/21/23 1138  Mobility  Activity Stood at bedside (Marched in place)  Level of Assistance Minimal assist, patient does 75% or more  Assistive Device Front wheel walker  Activity Response Tolerated well  Mobility Referral Yes  $Mobility charge 1 Mobility   Pt received in bed and agreeable. Pt marched in place for ~20sec 2 times with MinA for balance. Pt requested assistance to [facility] for BM with CG assist, BM unsuccessful. Pt returned to supine with all needs met, call bell in reach, and bed alarm on.   Andrey Campanile Mobility Specialist Please contact via SecureChat or  Rehab office at [phone]

## 2023-01-21 NOTE — Progress Notes (Signed)
     Daily Progress Note Intern Pager: [phone]  Patient name: Marcus Martin Medical record number: [id number] Date of birth: October 09, 1933 Age: 89 y.o. Gender: male  Primary Care Provider: Curly Rim, MD Consultants: Vascular surgery Code Status: DNR  Pt Overview and Major Events to Date:  2/14: admitted, started on heparin  2/17: Transitioned to eliquis from heparin   Assessment and Plan: Marcus Martin is a 89 y.o. male presenting with left leg pain concerning for critical ischemia. Improving with anticoagulation- deferred surgery at this time transitioned to p.o. anticoagulation 01/20/2023  * Lower limb ischemia AKA canceled as patient had improved pain and doppler signal. This morning left foot is well perfused, moving toes, improved pain. - Vascular surgery following, appreciate recommendations - Fall precautions - Tylenol prn -Continue eliquis  Anemia Hgb 9.2. Hx of GI bleed with Eliquis. Currently on heparin drip.  - Continue to monitor for signs or symptoms of GI bleed - Transfusion threshold 8. - AM CBC   Urinary retention Foley removed today for void trial -Continue flomax - bladder scans q8h prn  AKI (acute kidney injury) ([place]) Resolved with cr 1.11  this AM. Removing foley today. Baseline Cr ~ 1.0  -Trend on a.m. BMP -Strict ins and outs  Community acquired pneumonia Afebrile and WBC now WNL. - S/p augmentin  -Keep O2 sats 88 to 92% -Monitor fever curve    FEN/GI: Regular PPx: Eliquis Dispo:SNF pending continued medical management   Subjective:  Patient states he is feeling well this morning, denies pain in his extremities  Objective: Temp:  [97.7 F (36.5 C)-98.7 F (37.1 C)] 98.6 F (37 C) (02/18 0307) Pulse Rate:  [72-77] 77 (02/18 0307) Resp:  [18-20] 20 (02/18 0307) BP: (122-156)/(48-59) 152/59 (02/18 0307) SpO2:  [97 %-100 %] 97 % (02/18 0307) Weight:  [76.9 kg] 76.9 kg (02/18 0500) Physical Exam: General: Patient lying  comfortably in bed, NAD, pleasant to speak with Cardiovascular: RRR Respiratory: CTAB, breathing comfortably on room air, wet cough present Extremities: Bilateral feet warm, sensation intact  Laboratory: Most recent CBC Lab Results  Component Value Date   WBC 7.7 01/21/2023   HGB 9.2 (L) 01/21/2023   HCT 27.2 (L) 01/21/2023   MCV 91.6 01/21/2023   PLT 182 01/21/2023   Most recent BMP    Latest Ref Rng & Units 01/21/2023    1:15 AM  BMP  Glucose 70 - 99 mg/dL 115   BUN 8 - 23 mg/dL 25   Creatinine 0.61 - 1.24 mg/dL 1.11   Sodium 135 - 145 mmol/L 130   Potassium 3.5 - 5.1 mmol/L 4.0   Chloride 98 - 111 mmol/L 106   CO2 22 - 32 mmol/L 19   Calcium 8.9 - 10.3 mg/dL 7.5     Marcus Gilding, DO 01/21/2023, 6:58 AM  PGY-2, [place] Intern pager: [phone], text pages welcome Secure chat group [place]

## 2023-01-22 DIAGNOSIS — I998 Other disorder of circulatory system: Secondary | ICD-10-CM | POA: Diagnosis not present

## 2023-01-22 DIAGNOSIS — R339 Retention of urine, unspecified: Secondary | ICD-10-CM | POA: Diagnosis not present

## 2023-01-22 LAB — CBC
HCT: 29.2 % — ABNORMAL LOW (ref 39.0–52.0)
Hemoglobin: 10.2 g/dL — ABNORMAL LOW (ref 13.0–17.0)
MCH: 31.7 pg (ref 26.0–34.0)
MCHC: 34.9 g/dL (ref 30.0–36.0)
MCV: 90.7 fL (ref 80.0–100.0)
Platelets: 196 10*3/uL (ref 150–400)
RBC: 3.22 MIL/uL — ABNORMAL LOW (ref 4.22–5.81)
RDW: 13.6 % (ref 11.5–15.5)
WBC: 6.9 10*3/uL (ref 4.0–10.5)
nRBC: 0 % (ref 0.0–0.2)

## 2023-01-22 LAB — BASIC METABOLIC PANEL
Anion gap: 4 — ABNORMAL LOW (ref 5–15)
BUN: 20 mg/dL (ref 8–23)
CO2: 22 mmol/L (ref 22–32)
Calcium: 7.9 mg/dL — ABNORMAL LOW (ref 8.9–10.3)
Chloride: 106 mmol/L (ref 98–111)
Creatinine, Ser: 1.15 mg/dL (ref 0.61–1.24)
GFR, Estimated: >60 mL/min (ref >60)
Glucose, Bld: 95 mg/dL (ref 70–99)
Potassium: 4.4 mmol/L (ref 3.5–5.1)
Sodium: 132 mmol/L — ABNORMAL LOW (ref 135–145)

## 2023-01-22 MED ORDER — TAMSULOSIN HCL 0.4 MG PO CAPS: 0.8000 mg | ORAL_CAPSULE | Freq: Every day | ORAL | 0 refills | Status: AC

## 2023-01-22 MED ORDER — APIXABAN 5 MG PO TABS: 5.0000 mg | ORAL_TABLET | Freq: Two times a day (BID) | ORAL | 0 refills | Status: AC

## 2023-01-22 MED ORDER — ATORVASTATIN CALCIUM 40 MG PO TABS: 20.0000 mg | ORAL_TABLET | Freq: Every day | ORAL | Status: AC

## 2023-01-22 NOTE — Progress Notes (Signed)
Physical Therapy Treatment Patient Details Name: Marcus Martin MRN: [id number] DOB: [birth year] Today's Date: 01/22/2023   History of Present Illness Pt is an 89 y/o M admitted on 01/17/23 after presenting with LLE pain concerning for critical ischemia. Pt initially opted for L AKA but sx was cancelled after improvement following IV heparin. PMH: B hearing loss, COPD, GERD, HLD, HTN, NSTEMI, OSA, peripheral neuropathy    PT Comments    Pt seen for PT tx with pt agreeable. Pt is able to complete supine>sit with mod I with hospital bed features but requires up to max assist to fully scoot to EOB. Pt performs multiple STS from [place] with cuing for safe hand placement with improving return demo. Pt is able to take a few steps in room with RW & CGA<>min assist x 2 trials but overall distance is limited by fatigue & SOB. PT educated pt on pursed lip breathing. Continue to recommend STR upon d/c.    Recommendations for follow up therapy are one component of a multi-disciplinary discharge planning process, led by the attending physician.  Recommendations may be updated based on patient status, additional functional criteria and insurance authorization.  Follow Up Recommendations  Skilled nursing-short term rehab (<3 hours/day) Can patient physically be transported by private vehicle: Yes   Assistance Recommended at Discharge Frequent or constant Supervision/Assistance  Patient can return home with the following A little help with walking and/or transfers;A little help with bathing/dressing/bathroom   Equipment Recommendations  None recommended by PT    Recommendations for Other Services       Precautions / Restrictions Precautions Precautions: Fall Restrictions Weight Bearing Restrictions: No     Mobility  Bed Mobility Overal bed mobility: Modified Independent Bed Mobility: Supine to Sit     Supine to sit: Modified independent (Device/Increase time), HOB elevated      General bed mobility comments: Pt able to transfer supine>sit with HOB elevated, bed rails. Pt requires up to max assist to fully scoot to sitting EOB.    Transfers Overall transfer level: Needs assistance Equipment used: Rolling walker (2 wheels) Transfers: Sit to/from Stand, Bed to chair/wheelchair/BSC Sit to Stand: Min assist   Step pivot transfers: Min guard (bed>recliner with RW)       General transfer comment: Cuing for safe hand placement with good return demo with ongoing cuing.    Ambulation/Gait Ambulation/Gait assistance: Min assist, Min guard Gait Distance (Feet): 5 Feet (+ 7 ft) Assistive device: Rolling walker (2 wheels) Gait Pattern/deviations: Decreased step length - right, Decreased step length - left, Decreased stride length Gait velocity: decreased     General Gait Details: Pt ambulates a few steps forwards & backwards in room with RW & CGA<>min assist with seated rest break between each trial. Pt unable to progress gait distance 2/2 fatigue & SOB.   Stairs             Wheelchair Mobility    Modified Rankin (Stroke Patients Only)       Balance Overall balance assessment: Needs assistance Sitting-balance support: Feet supported Sitting balance-Leahy Scale: Good     Standing balance support: During functional activity, Bilateral upper extremity supported, Reliant on assistive device for balance Standing balance-Leahy Scale: Fair                              Cognition Arousal/Alertness: Awake/alert Behavior During Therapy: WFL for tasks assessed/performed Overall Cognitive Status: Within Functional Limits for tasks  assessed                                 General Comments: Pleasant man, follows simple cuing throughout session.        Exercises      General Comments General comments (skin integrity, edema, etc.): Pt on room air with SPO2 91% or >. Pt with productive cough. Assisted pt with changing into a  clean gown.      Pertinent Vitals/Pain Pain Assessment Pain Assessment: No/denies pain    Home Living                          Prior Function            PT Goals (current goals can now be found in the care plan section) Acute Rehab PT Goals Patient Stated Goal: get better PT Goal Formulation: With patient Time For Goal Achievement: 02/02/23 Potential to Achieve Goals: Fair Progress towards PT goals: Progressing toward goals    Frequency    Min 3X/week      PT Plan Current plan remains appropriate    Co-evaluation              AM-PAC PT "6 Clicks" Mobility   Outcome Measure  Help needed turning from your back to your side while in a flat bed without using bedrails?: A Little Help needed moving from lying on your back to sitting on the side of a flat bed without using bedrails?: A Little Help needed moving to and from a bed to a chair (including a wheelchair)?: A Little Help needed standing up from a chair using your arms (e.g., wheelchair or bedside chair)?: A Little Help needed to walk in hospital room?: Total Help needed climbing 3-5 steps with a railing? : Total 6 Click Score: 14    End of Session   Activity Tolerance: Patient limited by fatigue Patient left: in chair;with chair alarm set;with call bell/phone within reach   PT Visit Diagnosis: Muscle weakness (generalized) (M62.81);Difficulty in walking, not elsewhere classified (R26.2)     Time: [id number] PT Time Calculation (min) (ACUTE ONLY): 24 min  Charges:  $Therapeutic Activity: 23-37 mins                     Lavone Nian, PT, DPT 01/22/23, 1:19 PM   Waunita Schooner 01/22/2023, 1:18 PM

## 2023-01-22 NOTE — TOC Progression Note (Signed)
Transition of Care [facility]) - Progression Note    Patient Details  Name: Labryan Ramakrishnan MRN: [id number] Date of Birth: 09/24/1933  Transition of Care [facility]) CM/SW [place], [place] Phone Number: 01/22/2023, 2:47 PM  Clinical Narrative:    Insurance authorization started reference # [id number]. Insurance pending for [employer].   Updated [facility] of anticipated d/c tomorrow.  Thurmond Butts, MSW, LCSW Clinical Social Worker    Expected Discharge Plan: Skilled Nursing Facility Barriers to Discharge: Continued Medical Work up  Expected Discharge Plan and Services In-house Referral: Clinical Social Work                                             Social Determinants of Health (SDOH) Interventions SDOH Screenings   Food Insecurity: No Food Insecurity (01/18/2023)  Housing: Low Risk  (01/18/2023)  Transportation Needs: No Transportation Needs (01/18/2023)  Utilities: Not At Risk (01/18/2023)  Tobacco Use: Medium Risk (01/17/2023)    Readmission Risk Interventions     No data to display

## 2023-01-22 NOTE — Progress Notes (Signed)
     Daily Progress Note Intern Pager: [phone]  Patient name: Marcus Martin Medical record number: [id number] Date of birth: 08-03-1933 Age: 89 y.o. Gender: male  Primary Care Provider: Curly Rim, MD Consultants: Vascular surgery Code Status: DNR   Pt Overview and Major Events to Date:  2/14: admitted, started on heparin  2/17: Transitioned to eliquis from heparin    Assessment and Plan: Marcus Martin is a 89 y.o. male presenting with left leg pain concerning for critical ischemia. Improving with anticoagulation- deferred surgery at this time transitioned to p.o. anticoagulation 01/20/2023  * Lower limb ischemia Continues to be improved while on anticoagulations.  - Vascular surgery following, appreciate recommendations - Fall precautions - Tylenol prn - Continue eliquis  Anemia Hgb 10.2. Stable. Hx of GI bleed with Eliquis.  - Continue eliquis for lower limb ischemia.  - Continue to monitor for signs or symptoms of GI bleed - Transfusion threshold 8. - AM CBC   Urinary retention Patient has had over 1L UOP since removal of foley.  - Continue flomax - Can discontinue bladder scans    FEN/GI: Regular  PPx: therapeutic Eliquis Dispo:SNF pending placement Medically stable for discharge.   Subjective:  Patient denies leg pain. Says he has mild lower abdominal pain that is tolerable. No nausea, vomiting, diarrhea, or blood per rectum.   Objective: Temp:  [97.9 F (36.6 C)-98.1 F (36.7 C)] 97.9 F (36.6 C) (02/19 0740) Pulse Rate:  [69-81] 81 (02/19 0740) Resp:  [16-21] 20 (02/19 0740) BP: (138-153)/(52-64) 149/64 (02/19 0740) SpO2:  [93 %-99 %] 93 % (02/19 0740) Weight:  [76.9 kg] 76.9 kg (02/19 0600) Physical Exam: General: Chronically ill appearing, very hard of hearing   Cardiovascular: RRR, cap refill < 2 seconds Respiratory: No increased work of breathing on room air, chronic productive productive cough, prolonged expiratory phase   Abdomen: Non  distended, soft, tender to palpation without guarding lower abdomen  Extremities: trace BLE edema, bilateral palpable dorsal pedal pulses, BLE appropriately warm to touch   Laboratory: Most recent CBC Lab Results  Component Value Date   WBC 6.9 01/22/2023   HGB 10.2 (L) 01/22/2023   HCT 29.2 (L) 01/22/2023   MCV 90.7 01/22/2023   PLT 196 01/22/2023   Most recent BMP    Latest Ref Rng & Units 01/22/2023    1:23 AM  BMP  Glucose 70 - 99 mg/dL 95   BUN 8 - 23 mg/dL 20   Creatinine 0.61 - 1.24 mg/dL 1.15   Sodium 135 - 145 mmol/L 132   Potassium 3.5 - 5.1 mmol/L 4.4   Chloride 98 - 111 mmol/L 106   CO2 22 - 32 mmol/L 22   Calcium 8.9 - 10.3 mg/dL 7.9     Lowry Ram, MD 01/22/2023, 8:37 AM  PGY-1, [place] Intern pager: [phone], text pages welcome Secure chat group [place]

## 2023-01-22 NOTE — Progress Notes (Signed)
Called and updated Marcus Martin - confirmed patient's DOB. He expressed frustration about patient being medically stable and ready for discharge but not able to go to his facility. I updated him per social work's note that insurance authorization is in process and that I will send another message to social work to ensure we can hopefully discharge him tomorrow.

## 2023-01-22 NOTE — Discharge Instructions (Signed)
Dear Marcus Martin,  Thank you for letting us participate in your care. You were hospitalized and diagnosed with Lower limb ischemia. You were treated with with blood thinners (heparin, then eliquis), which improved circulation to your leg, improving the function and appearance.   POST-HOSPITAL & CARE INSTRUCTIONS Continue Eliquis Watch out for signs of leg ischemia Go to Emergency Department if: Intense leg pain, not amenable to pain meds Pale color to leg Loss of pulses in your foot Change in sensation over leg Paralysis of leg Watch out for signs of bleeding See PCP if: Blood in stools, or dark tar-like stools Development of large bruises, increasing in size Fatigue suddenly brought on, and not improving over days If you hit your head, please see your doctor, you may need head imaging for risk of bleeding Go to your follow up appointments (listed below)   DOCTOR'S APPOINTMENT   Future Appointments  Date Time Provider [place]  02/09/2023  1:00 PM Hunsucker, Bonna Gains, MD [facility] None    Follow-up Information     [name], MD Follow up.   Specialty: Vascular Surgery Why: Office will call you to arrange your appt (sent) Contact information: [place] [address] [phone]                 Take care and be well!  [facility]  [place], [place] [address] [phone]

## 2023-01-23 DIAGNOSIS — H9193 Unspecified hearing loss, bilateral: Secondary | ICD-10-CM | POA: Diagnosis not present

## 2023-01-23 DIAGNOSIS — D649 Anemia, unspecified: Secondary | ICD-10-CM | POA: Diagnosis not present

## 2023-01-23 DIAGNOSIS — G629 Polyneuropathy, unspecified: Secondary | ICD-10-CM | POA: Diagnosis not present

## 2023-01-23 DIAGNOSIS — R140 Abdominal distension (gaseous): Secondary | ICD-10-CM | POA: Diagnosis not present

## 2023-01-23 DIAGNOSIS — G4733 Obstructive sleep apnea (adult) (pediatric): Secondary | ICD-10-CM | POA: Diagnosis not present

## 2023-01-23 DIAGNOSIS — R2681 Unsteadiness on feet: Secondary | ICD-10-CM | POA: Diagnosis not present

## 2023-01-23 DIAGNOSIS — G4700 Insomnia, unspecified: Secondary | ICD-10-CM | POA: Diagnosis not present

## 2023-01-23 DIAGNOSIS — D509 Iron deficiency anemia, unspecified: Secondary | ICD-10-CM | POA: Diagnosis not present

## 2023-01-23 DIAGNOSIS — N179 Acute kidney failure, unspecified: Secondary | ICD-10-CM | POA: Diagnosis not present

## 2023-01-23 DIAGNOSIS — R339 Retention of urine, unspecified: Secondary | ICD-10-CM | POA: Diagnosis not present

## 2023-01-23 DIAGNOSIS — R2689 Other abnormalities of gait and mobility: Secondary | ICD-10-CM | POA: Diagnosis not present

## 2023-01-23 DIAGNOSIS — R1312 Dysphagia, oropharyngeal phase: Secondary | ICD-10-CM | POA: Diagnosis not present

## 2023-01-23 DIAGNOSIS — K5900 Constipation, unspecified: Secondary | ICD-10-CM | POA: Diagnosis not present

## 2023-01-23 DIAGNOSIS — E785 Hyperlipidemia, unspecified: Secondary | ICD-10-CM | POA: Diagnosis not present

## 2023-01-23 DIAGNOSIS — N189 Chronic kidney disease, unspecified: Secondary | ICD-10-CM | POA: Diagnosis not present

## 2023-01-23 DIAGNOSIS — R5381 Other malaise: Secondary | ICD-10-CM | POA: Diagnosis not present

## 2023-01-23 DIAGNOSIS — I4891 Unspecified atrial fibrillation: Secondary | ICD-10-CM | POA: Diagnosis not present

## 2023-01-23 DIAGNOSIS — L853 Xerosis cutis: Secondary | ICD-10-CM | POA: Diagnosis not present

## 2023-01-23 DIAGNOSIS — J449 Chronic obstructive pulmonary disease, unspecified: Secondary | ICD-10-CM | POA: Diagnosis not present

## 2023-01-23 DIAGNOSIS — K2100 Gastro-esophageal reflux disease with esophagitis, without bleeding: Secondary | ICD-10-CM | POA: Diagnosis not present

## 2023-01-23 DIAGNOSIS — E222 Syndrome of inappropriate secretion of antidiuretic hormone: Secondary | ICD-10-CM | POA: Diagnosis not present

## 2023-01-23 DIAGNOSIS — M62262 Nontraumatic ischemic infarction of muscle, left lower leg: Secondary | ICD-10-CM | POA: Diagnosis not present

## 2023-01-23 DIAGNOSIS — R600 Localized edema: Secondary | ICD-10-CM | POA: Diagnosis not present

## 2023-01-23 DIAGNOSIS — M6281 Muscle weakness (generalized): Secondary | ICD-10-CM | POA: Diagnosis not present

## 2023-01-23 DIAGNOSIS — I998 Other disorder of circulatory system: Secondary | ICD-10-CM | POA: Diagnosis not present

## 2023-01-23 DIAGNOSIS — H93293 Other abnormal auditory perceptions, bilateral: Secondary | ICD-10-CM | POA: Diagnosis not present

## 2023-01-23 DIAGNOSIS — I10 Essential (primary) hypertension: Secondary | ICD-10-CM | POA: Diagnosis not present

## 2023-01-23 DIAGNOSIS — R109 Unspecified abdominal pain: Secondary | ICD-10-CM | POA: Diagnosis not present

## 2023-01-23 DIAGNOSIS — K567 Ileus, unspecified: Secondary | ICD-10-CM | POA: Diagnosis not present

## 2023-01-23 MED ORDER — GUAIFENESIN-DM 100-10 MG/5ML PO SYRP: 5.0000 mL | ORAL_SOLUTION | ORAL | Status: DC | PRN

## 2023-01-23 NOTE — Progress Notes (Signed)
Attempted report to [facility] X 1

## 2023-01-23 NOTE — Progress Notes (Signed)
Speech Language Pathology Treatment: Dysphagia  Patient Details Name: Marcus Martin MRN: [id number] DOB: 1933/06/02 Today's Date: 01/23/2023 Time: [id number] SLP Time Calculation (min) (ACUTE ONLY): 8 min  Assessment / Plan / Recommendation Clinical Impression  Pt able to self feed breakfast after assist to open containers. He had a baseline cough and continued to have delayed coughs during session. This is similar to presentation during MBS when he coughed but no penetration or aspiration was seen. Suspect cough is due to pneumonia and not airway intrusion with po's. He took small bites and sips independently and did not demonstrate increase work of breathing. Timely mastication and full oral clearance. Recommend he continue regular texture, thin liquids, pills with thin, sit upright and ST will sign off at this time.    HPI HPI: Marcus Martin is a 89 y.o. male presenting with left leg pain concerning for critical ischemia. Differential for this patient's presentation of this includes critical limb ischemia, DVT, compartment syndrome. CT angio of femorals showed infrarenal abdominal aortic occlusion, bilateral external iliac artery occlusion, and occluded left superficial femoral artery and popliteal arteries. Per chart plan is for left AKA 2/16. Found to have AKI, community acquired pneumonia. Per chart   Recently admitted from 01/08/2023 to 01/12/2023 for COPD exacerbation as well as right upper lobe cavitary pneumonia versus mass. PMH: ACDF with current cervical spine CT showing osteophytes, COPD, GERD, OSA, bilateral hearing loss, HTN. Had BSE 11/05/21 and suspected a potential structural dysphagia from cervical osteophytes with potential MBS recommended. F/U ST 12/6 after EGD and pt indicated no difficulty swallowing and did not wish to have MBS. SLP offered educated for management of esophageal issues and notify PCP if he does want to have MBS in future.      SLP Plan  All goals met;Discharge SLP  treatment due to (comment)      Recommendations for follow up therapy are one component of a multi-disciplinary discharge planning process, led by the attending physician.  Recommendations may be updated based on patient status, additional functional criteria and insurance authorization.    Recommendations  Diet recommendations: Regular;Thin liquid Liquids provided via: Straw Medication Administration: Whole meds with liquid Supervision: Patient able to self feed Compensations: Small sips/bites;Slow rate Postural Changes and/or Swallow Maneuvers: Seated upright 90 degrees                Oral Care Recommendations: Oral care BID Follow Up Recommendations: No SLP follow up Assistance recommended at discharge: Intermittent Supervision/Assistance SLP Visit Diagnosis: Dysphagia, oropharyngeal phase (R13.12) Plan: All goals met;Discharge SLP treatment due to (comment)           Houston Siren  01/23/2023, 9:20 AM

## 2023-01-23 NOTE — Discharge Summary (Signed)
[facility] Discharge Summary  Patient name: Marcus Martin Medical record number: [id number] Date of birth: [birth year] Age: 89 y.o. Gender: male Date of Admission: 01/17/2023  Date of Discharge: 01/23/23  Admitting Physician: Blane Ohara McDiarmid, MD  Primary Care Provider: Curly Rim, MD Consultants: VVS  Indication for Hospitalization: Critical Limb Ischemia   Discharge Diagnoses/Problem List:  Principal Problem for Admission: Critical Limb Ischemia  Other Problems addressed during stay:  Principal Problem:   Lower limb ischemia Active Problems:   Urinary retention   Anemia    Brief Hospital Course:  Marcus Martin is an 89 year old male who presented with concern for critical limb ischemia of left leg.  Additionally being treated for community-acquired pneumonia.  His hospital course is outlined below.  Left lower limb ischemia On initial presentation left leg was cold, unable to palpate DP/PT. CTA angio showed infrarenal abdominal aortic occlusion, bilateral external iliac artery occlusion, and occluded left superficial femoral artery and popliteal arteries.  He was started on a heparin drip and vascular surgery was consulted.  Vascular surgery believes that he would not be a good candidate for bypass, and recommended AKA which family and patient agreed with.  However on 2/15 prior to operation on 2/16 patient had improved pain and function of the left limb.  Vascular surgery canceled AKA, and recommended continuing anticoagulant therapy. Leg almost completely recovered and patient was transitioned to eliquis on 2/17. Patient has had GI bleeds in the past while on eliquis, but patient and family made a shared decision that they would prefer continue anticoagulation.   AKI (acute kidney injury) ([place]) Creatinine initially elevated to 2.37, with a baseline of 0.9-1.  Noted to have dry mucous membranes, and bladder scan was performed.  Bladder scan  showed greater than 600 mL, and Foley catheter was placed.  Flomax increased to 0.8 mg (home dose of 0.4 mg).  AKI believed to be secondary to obstructive pathology and continue to improve with appropriate urinary output.  Patient had successful void trial on 2/18.   Community-acquired pneumonia Recently admitted from 01/08/2023 to 01/12/2023 for COPD exacerbation mild right upper lobe cavitary pneumonia versus mass.  He was treated in the hospital with IV antibiotics and transition to oral Augmentin.  At SNF, Augmentin was renally dosed.  We continued renally dosed Augmentin to complete his treatment course on 01/19/2023.  Patient remained asymptomatic during hospitalization.  Follow-up recommendations F/u with vascular outpatient for ABI  F/u any signs of GIB, patient has had history of GIB on eliquis  Patient has OSA, declined CPAP during night time in the hospital resulting in O2 use via nasal cannula overnight. Could try different mask fits or nasal pillows etc.   Disposition: SNF  Discharge Condition: Improved, Stable   Discharge Exam:  Vitals:   01/23/23 0744 01/23/23 0751  BP: (!) 163/65   Pulse: 73   Resp: 17   Temp: 98.3 F (36.8 C)   SpO2: 95% 96%   Physical Exam: General: Chronically ill appearing, very hard of hearing   Cardiovascular: RRR, cap refill < 2 seconds Respiratory: No increased work of breathing on room air, chronic productive productive cough, prolonged expiratory phase   Abdomen: Non distended, soft, tender to palpation without guarding lower abdomen  Extremities: trace BLE edema, bilateral palpable dorsal pedal pulses, BLE appropriately warm to touch   Significant Procedures:   DG Swallow Func Speech Path  Pt exhibited minimal oropharyngeal dysphagia in setting of remote ACDF surgery and khyphotic/lordodic  spinal presentation. He exhibited occasional difficulty controlling/containing bolus within oral cavity with some spill over posterior tongue. Swallows were  initiated at the level of the pyriform sinuses. His anterior hyoid excursion, laryngeal elevation and closure and epiglottic inversion were all intact which prevented any penetration or aspiration during the study. He did have 4 instances where he immediately coughed after various swallow during study (as present during bedside evaluation) although there was no barium seen in laryngeal vestibule or trachea during the swallow or when fluoro turned back on. Suspect cough due to current pna. He had trace-mild vallecular residue due to reduced tongue base retraction and slightly diminished stripping wave. Esophageal scan was unremarkable. Mastication and clearance of regular texture was functional and recommend he continue regular texture, thin liquids, pills with thin, sit upright, straws allowed. He does become dyspneic due to intermittent coughing and recommend he take breaks when needed during meals. ST will follow up briefly to reiterate swallow strategies.   Significant Labs and Imaging:  Recent Labs  Lab 01/22/23 0123  WBC 6.9  HGB 10.2*  HCT 29.2*  PLT 196   Recent Labs  Lab 01/22/23 0123  NA 132*  K 4.4  CL 106  CO2 22  GLUCOSE 95  BUN 20  CREATININE 1.15  CALCIUM 7.9*    CXR 01/08/2023 No acute cardiopulmonary disease.   CT Angio Chest PE  3.7 x 3.6 cm irregular thick walled cavitary mass in the medial right upper lobe with surrounding ground-glass consolidation. Differential considerations would include cavitary pneumonia, atypical infection, or malignancy.   Coronary artery disease.   Aortic Atherosclerosis (ICD10-I70.0).  CT Angio AO BIFEM W & OR WO Contrast  1. 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 3. Moderate stenosis of the common femoral arteries bilaterally, worse on the left. 4. Occluded left superficial femoral artery and popliteal arteries. 5. No opacification of the left lower leg arteries. 6.  Proximal occlusion of superior mesenteric artery with reconstitution of flow 3.5 cm from the origin. 7. Fluid collections within the rectus sheath bilaterally measuring 9.2 x 2.8 x 1.9 cm on the right and 8.9 x 2.4 x 2.1 cm on the left may relate to seromas, hematomas, or abscesses. 8. Distal colonic diverticulosis.  Results/Tests Pending at Time of Discharge: None  Discharge Medications:  Allergies as of 01/23/2023       Reactions   Brilinta [ticagrelor] Other (See Comments)   GI Hemorrhage   Coffea Arabica    DECAF coffee causes vision changes   Tetanus Toxoids Other (See Comments)   "Horse serum only" Unknown reaction Documented on MAR   Zocor [simvastatin] Other (See Comments)   Myalgias    Spiriva Respimat [tiotropium Bromide Monohydrate] Other (See Comments)   Urinary retention    Niacin Other (See Comments)   Flushing         Medication List     STOP taking these medications    amoxicillin-clavulanate 875-125 MG tablet Commonly known as: AUGMENTIN   predniSONE 20 MG tablet Commonly known as: DELTASONE       TAKE these medications    acetaminophen 500 MG tablet Commonly known as: TYLENOL Take 1,000 mg by mouth every 8 (eight) hours as needed for fever.   albuterol 108 (90 Base) MCG/ACT inhaler Commonly known as: VENTOLIN HFA Inhale 2 puffs into the lungs every 6 (six) hours as needed for wheezing or shortness of breath.   apixaban 5 MG Tabs tablet Commonly known as: ELIQUIS  Take 1 tablet (5 mg total) by mouth 2 (two) times daily.   ascorbic acid 500 MG tablet Commonly known as: VITAMIN C Take 500 mg by mouth daily.   aspirin EC 81 MG tablet Take 81 mg by mouth daily.   atorvastatin 40 MG tablet Commonly known as: LIPITOR Take 0.5 tablets (20 mg total) by mouth daily. What changed:  medication strength when to take this   Calcium 500 +D 500-10 MG-MCG Tabs Generic drug: Calcium Carb-Cholecalciferol Take 1 tablet by mouth daily.    cholecalciferol 25 MCG (1000 UT) tablet Generic drug: Cholecalciferol Take 1,000 Units by mouth daily.   docusate sodium 100 MG capsule Commonly known as: COLACE Take 100 mg by mouth 2 (two) times daily.   ferrous sulfate 325 (65 FE) MG EC tablet Take 325 mg by mouth every Monday, Wednesday, and Friday.   furosemide 20 MG tablet Commonly known as: LASIX Take 20 mg by mouth See admin instructions. 2 entries on MAR: 20 mg once a morning for 3 days (01/16/23-01/18/23) + 20 mg once daily as needed for signs of volume overload (gain of 3 lbs over night or 5 lbs in 1 week, increased shortness of breath and/or increased leg swelling)   gabapentin 300 MG capsule Commonly known as: NEURONTIN Take 1 capsule (300 mg total) by mouth 2 (two) times daily.   guaiFENesin 600 MG 12 hr tablet Commonly known as: MUCINEX Take 600 mg by mouth every 12 (twelve) hours.   ipratropium-albuterol 0.5-2.5 (3) MG/3ML Soln Commonly known as: DUONEB Take 3 mLs by nebulization every 6 (six) hours.   latanoprost 0.005 % ophthalmic solution Commonly known as: XALATAN Place 1 drop into both eyes at bedtime.   lisinopril 10 MG tablet Commonly known as: ZESTRIL Take 5 mg by mouth See admin instructions. 5 mg once daily. HOLD for SBP < 100.   Lutein 10 MG Tabs Take 10 mg by mouth daily.   Minerin Creme Crea Apply 1 Application topically daily. To legs and back.   multivitamin tablet Take 1 tablet by mouth daily. Multivitamin + Folic acid [id number] mcg.   nystatin powder Generic drug: nystatin Apply 1 Application topically 2 (two) times daily as needed (irritation). To groin area   pantoprazole 40 MG tablet Commonly known as: PROTONIX Take 1 tablet (40 mg total) by mouth 2 (two) times daily before a meal. What changed: when to take this   Refresh Tears 0.5 % Soln Generic drug: carboxymethylcellulose Place 1 drop into both eyes 4 (four) times daily.   Risa-Bid Probiotic Tabs Take 1 tablet by mouth  daily.   senna 8.6 MG Tabs tablet Commonly known as: SENOKOT Take 17.2 mg by mouth daily as needed (constipation).   Simbrinza 1-0.2 % Susp Generic drug: Brinzolamide-Brimonidine Place 1 drop into both eyes 2 (two) times daily.   sodium chloride 1 g tablet Take 1 tablet (1 g total) by mouth 3 (three) times daily with meals.   tamsulosin 0.4 MG Caps capsule Commonly known as: FLOMAX Take 2 capsules (0.8 mg total) by mouth at bedtime. What changed:  how much to take when to take this additional instructions   traZODone 50 MG tablet Commonly known as: DESYREL Take 0.5 tablets (25 mg total) by mouth at bedtime.   Triple Antibiotic Oint Apply 1 Application topically 2 (two) times daily as needed (minor wounds/cuts).   Tudorza Pressair 400 MCG/ACT Aepb Generic drug: Aclidinium Bromide Inhale 1 puff into the lungs in the morning and at bedtime.  Wixela Inhub 250-50 MCG/ACT Aepb Generic drug: fluticasone-salmeterol Inhale 1 puff into the lungs in the morning and at bedtime.   zinc oxide 20 % ointment Apply 1 Application topically as needed for irritation. To buttocks.        Discharge Instructions: Please refer to Patient Instructions section of EMR for full details.  Patient was counseled important signs and symptoms that should prompt return to medical care, changes in medications, dietary instructions, activity restrictions, and follow up appointments.   Follow-Up Appointments:  Follow-up Information     [name], MD Follow up.   Specialty: Vascular Surgery Why: Office will call you to arrange your appt (sent) Contact information: [address] [place] [address] [phone]         Corrington, Kip A, MD Follow up in 1 week(s).   Specialty: Family Medicine Why: See PCP within 1 week of discharge from hospital Contact information: [place] [address] [phone]                 Lowry Ram,  MD 01/23/2023, 10:28 AM PGY-1, [place]

## 2023-01-23 NOTE — Progress Notes (Signed)
Report called to [facility]. Awaiting PTAR at this time

## 2023-01-23 NOTE — TOC Transition Note (Signed)
Transition of Care [facility]) - CM/SW Discharge Note   Patient Details  Name: Marcus Martin MRN: [id number] Date of Birth: [birth year]  [facility]) CM/SW Contact:  Vinie Sill, LCSW Phone Number: 01/23/2023, 11:21 AM   Clinical Narrative:     Patient will Discharge to: [facility] Discharge Date: 01/23/23 Family Notified: son Transport By: Corey Harold  Per MD patient is ready for discharge. RN, patient, and facility notified of discharge. Discharge Summary sent to facility. RN given number for [phone], Room 36. Ambulance transport requested for patient.   Clinical Social Worker signing off.  Thurmond Butts, MSW, LCSW Clinical Social Worker     Final next level of care: Skilled Nursing Facility Barriers to Discharge: Barriers Resolved   Patient Goals and CMS Choice CMS Medicare.gov Compare Post Acute Care list provided to:: Patient Choice offered to / list presented to : Patient  Discharge Placement                Patient chooses bed at:  [facility]) Patient to be transferred to facility by: [place] Name of family member notified: son Patient and family notified of of transfer: 01/23/23  Discharge Plan and Services Additional resources added to the After Visit Summary for   In-house Referral: Clinical Social Work                                   Social Determinants of Health ([place]) Interventions SDOH Screenings   Food Insecurity: No Food Insecurity (01/18/2023)  Housing: Low Risk  (01/18/2023)  Transportation Needs: No Transportation Needs (01/18/2023)  Utilities: Not At Risk (01/18/2023)  Tobacco Use: Medium Risk (01/17/2023)     Readmission Risk Interventions     No data to display

## 2023-01-23 NOTE — Progress Notes (Signed)
Patient clothing and shoes left on [facility]east at D/C. Family notified " Maudie Mercury" at [phone] notified by phone call.

## 2023-01-23 NOTE — Consult Note (Signed)
   [facility] CM Inpatient Consult   01/23/2023  Marcus Martin Jul 19, 1933 [id number]  [place] [facility] [ACO] Patient: [name]  Primary Care Provider:  Corrington, Delsa Grana, MD   Patient was reviewed and screended for less than 7 days readmission high risk score length of stay   If the patient goes to a [facility] affiliated facility then, patient can be followed by [place] [facility] PAC RN with traditional Medicare and approved Medicare Advantage plans.   Patient transitioned to [facility] which is an affiliated facility.   Plan:   Notified [facility] PAC RN who can follow for any known or needs for transitional care needs for returning to post facility care coordination needs to return to community.  For questions or referrals, please contact:   Natividad Brood, RN BSN [place]  [phone] business mobile phone Toll free office [phone]  *[place]  [phone] Fax number: [phone] [name]

## 2023-01-23 NOTE — TOC Progression Note (Signed)
Transition of Care [facility]) - Progression Note    Patient Details  Name: Marcus Martin MRN: [id number] Date of Birth: 1933/07/02  [facility] Care [facility]) CM/SW [place], [place] Phone Number: 01/23/2023, 10:08 AM  Clinical Narrative:     Received insurance auth # [id number] reference # [id number]  2/29-2/21  Thurmond Butts, MSW, LCSW Clinical Social Worker     Expected Discharge Plan: Skilled Nursing Facility Barriers to Discharge: Barriers Resolved  Expected Discharge Plan and Services In-house Referral: Clinical Social Work                                             Social Determinants of Health (SDOH) Interventions SDOH Screenings   Food Insecurity: No Food Insecurity (01/18/2023)  Housing: Low Risk  (01/18/2023)  Transportation Needs: No Transportation Needs (01/18/2023)  Utilities: Not At Risk (01/18/2023)  Tobacco Use: Medium Risk (01/17/2023)    Readmission Risk Interventions     No data to display

## 2023-01-24 DIAGNOSIS — K2100 Gastro-esophageal reflux disease with esophagitis, without bleeding: Secondary | ICD-10-CM | POA: Diagnosis not present

## 2023-01-24 DIAGNOSIS — K5900 Constipation, unspecified: Secondary | ICD-10-CM | POA: Diagnosis not present

## 2023-01-24 DIAGNOSIS — I10 Essential (primary) hypertension: Secondary | ICD-10-CM | POA: Diagnosis not present

## 2023-01-24 DIAGNOSIS — G4733 Obstructive sleep apnea (adult) (pediatric): Secondary | ICD-10-CM | POA: Diagnosis not present

## 2023-01-24 DIAGNOSIS — R5381 Other malaise: Secondary | ICD-10-CM | POA: Diagnosis not present

## 2023-01-24 DIAGNOSIS — R600 Localized edema: Secondary | ICD-10-CM | POA: Diagnosis not present

## 2023-01-24 DIAGNOSIS — N179 Acute kidney failure, unspecified: Secondary | ICD-10-CM | POA: Diagnosis not present

## 2023-01-24 DIAGNOSIS — N189 Chronic kidney disease, unspecified: Secondary | ICD-10-CM | POA: Diagnosis not present

## 2023-01-24 DIAGNOSIS — G4700 Insomnia, unspecified: Secondary | ICD-10-CM | POA: Diagnosis not present

## 2023-01-24 DIAGNOSIS — J449 Chronic obstructive pulmonary disease, unspecified: Secondary | ICD-10-CM | POA: Diagnosis not present

## 2023-01-24 DIAGNOSIS — E222 Syndrome of inappropriate secretion of antidiuretic hormone: Secondary | ICD-10-CM | POA: Diagnosis not present

## 2023-01-24 NOTE — Progress Notes (Addendum)
Patient Son Marcus Martin called regarding patient's belongings. He was made aware that patients belongings are here on 4E at the nurses station. All questions were answered and patient son verbalized understanding of current location of patients belongings and they could be picked up here. Marcus Martin, Marcus Gavia RN

## 2023-01-25 DIAGNOSIS — K5900 Constipation, unspecified: Secondary | ICD-10-CM | POA: Diagnosis not present

## 2023-01-25 DIAGNOSIS — R600 Localized edema: Secondary | ICD-10-CM | POA: Diagnosis not present

## 2023-01-25 DIAGNOSIS — N179 Acute kidney failure, unspecified: Secondary | ICD-10-CM | POA: Diagnosis not present

## 2023-01-25 DIAGNOSIS — N189 Chronic kidney disease, unspecified: Secondary | ICD-10-CM | POA: Diagnosis not present

## 2023-01-25 DIAGNOSIS — G4700 Insomnia, unspecified: Secondary | ICD-10-CM | POA: Diagnosis not present

## 2023-01-25 DIAGNOSIS — G4733 Obstructive sleep apnea (adult) (pediatric): Secondary | ICD-10-CM | POA: Diagnosis not present

## 2023-01-25 DIAGNOSIS — D649 Anemia, unspecified: Secondary | ICD-10-CM | POA: Diagnosis not present

## 2023-01-25 DIAGNOSIS — K2100 Gastro-esophageal reflux disease with esophagitis, without bleeding: Secondary | ICD-10-CM | POA: Diagnosis not present

## 2023-01-25 DIAGNOSIS — I10 Essential (primary) hypertension: Secondary | ICD-10-CM | POA: Diagnosis not present

## 2023-01-25 DIAGNOSIS — D509 Iron deficiency anemia, unspecified: Secondary | ICD-10-CM | POA: Diagnosis not present

## 2023-01-25 DIAGNOSIS — L853 Xerosis cutis: Secondary | ICD-10-CM | POA: Diagnosis not present

## 2023-01-25 DIAGNOSIS — J449 Chronic obstructive pulmonary disease, unspecified: Secondary | ICD-10-CM | POA: Diagnosis not present

## 2023-01-27 DIAGNOSIS — K5900 Constipation, unspecified: Secondary | ICD-10-CM | POA: Diagnosis not present

## 2023-01-29 DIAGNOSIS — R109 Unspecified abdominal pain: Secondary | ICD-10-CM | POA: Diagnosis not present

## 2023-01-31 DIAGNOSIS — G4700 Insomnia, unspecified: Secondary | ICD-10-CM | POA: Diagnosis not present

## 2023-01-31 DIAGNOSIS — E785 Hyperlipidemia, unspecified: Secondary | ICD-10-CM | POA: Diagnosis not present

## 2023-01-31 DIAGNOSIS — K5900 Constipation, unspecified: Secondary | ICD-10-CM | POA: Diagnosis not present

## 2023-01-31 DIAGNOSIS — R600 Localized edema: Secondary | ICD-10-CM | POA: Diagnosis not present

## 2023-01-31 DIAGNOSIS — D649 Anemia, unspecified: Secondary | ICD-10-CM | POA: Diagnosis not present

## 2023-01-31 DIAGNOSIS — N179 Acute kidney failure, unspecified: Secondary | ICD-10-CM | POA: Diagnosis not present

## 2023-01-31 DIAGNOSIS — G4733 Obstructive sleep apnea (adult) (pediatric): Secondary | ICD-10-CM | POA: Diagnosis not present

## 2023-01-31 DIAGNOSIS — I998 Other disorder of circulatory system: Secondary | ICD-10-CM | POA: Diagnosis not present

## 2023-01-31 DIAGNOSIS — E222 Syndrome of inappropriate secretion of antidiuretic hormone: Secondary | ICD-10-CM | POA: Diagnosis not present

## 2023-01-31 DIAGNOSIS — I4891 Unspecified atrial fibrillation: Secondary | ICD-10-CM | POA: Diagnosis not present

## 2023-01-31 DIAGNOSIS — K567 Ileus, unspecified: Secondary | ICD-10-CM | POA: Diagnosis not present

## 2023-01-31 DIAGNOSIS — I10 Essential (primary) hypertension: Secondary | ICD-10-CM | POA: Diagnosis not present

## 2023-01-31 DIAGNOSIS — K2100 Gastro-esophageal reflux disease with esophagitis, without bleeding: Secondary | ICD-10-CM | POA: Diagnosis not present

## 2023-02-01 DIAGNOSIS — K567 Ileus, unspecified: Secondary | ICD-10-CM | POA: Diagnosis not present

## 2023-02-01 DIAGNOSIS — I10 Essential (primary) hypertension: Secondary | ICD-10-CM | POA: Diagnosis not present

## 2023-02-01 DIAGNOSIS — G4733 Obstructive sleep apnea (adult) (pediatric): Secondary | ICD-10-CM | POA: Diagnosis not present

## 2023-02-01 DIAGNOSIS — I4891 Unspecified atrial fibrillation: Secondary | ICD-10-CM | POA: Diagnosis not present

## 2023-02-01 DIAGNOSIS — N189 Chronic kidney disease, unspecified: Secondary | ICD-10-CM | POA: Diagnosis not present

## 2023-02-01 DIAGNOSIS — K5900 Constipation, unspecified: Secondary | ICD-10-CM | POA: Diagnosis not present

## 2023-02-01 DIAGNOSIS — K2100 Gastro-esophageal reflux disease with esophagitis, without bleeding: Secondary | ICD-10-CM | POA: Diagnosis not present

## 2023-02-01 DIAGNOSIS — D649 Anemia, unspecified: Secondary | ICD-10-CM | POA: Diagnosis not present

## 2023-02-01 DIAGNOSIS — G4700 Insomnia, unspecified: Secondary | ICD-10-CM | POA: Diagnosis not present

## 2023-02-01 DIAGNOSIS — R5381 Other malaise: Secondary | ICD-10-CM | POA: Diagnosis not present

## 2023-02-01 DIAGNOSIS — R600 Localized edema: Secondary | ICD-10-CM | POA: Diagnosis not present

## 2023-02-01 DIAGNOSIS — J449 Chronic obstructive pulmonary disease, unspecified: Secondary | ICD-10-CM | POA: Diagnosis not present

## 2023-02-02 ENCOUNTER — Other Ambulatory Visit: Payer: Self-pay | Admitting: *Deleted

## 2023-02-02 DIAGNOSIS — J449 Chronic obstructive pulmonary disease, unspecified: Secondary | ICD-10-CM | POA: Diagnosis not present

## 2023-02-02 DIAGNOSIS — R2689 Other abnormalities of gait and mobility: Secondary | ICD-10-CM | POA: Diagnosis not present

## 2023-02-02 DIAGNOSIS — R140 Abdominal distension (gaseous): Secondary | ICD-10-CM | POA: Diagnosis not present

## 2023-02-02 DIAGNOSIS — R1312 Dysphagia, oropharyngeal phase: Secondary | ICD-10-CM | POA: Diagnosis not present

## 2023-02-02 DIAGNOSIS — M6281 Muscle weakness (generalized): Secondary | ICD-10-CM | POA: Diagnosis not present

## 2023-02-02 DIAGNOSIS — N189 Chronic kidney disease, unspecified: Secondary | ICD-10-CM | POA: Diagnosis not present

## 2023-02-02 DIAGNOSIS — H93293 Other abnormal auditory perceptions, bilateral: Secondary | ICD-10-CM | POA: Diagnosis not present

## 2023-02-02 DIAGNOSIS — I10 Essential (primary) hypertension: Secondary | ICD-10-CM | POA: Diagnosis not present

## 2023-02-02 DIAGNOSIS — R339 Retention of urine, unspecified: Secondary | ICD-10-CM | POA: Diagnosis not present

## 2023-02-02 DIAGNOSIS — M62262 Nontraumatic ischemic infarction of muscle, left lower leg: Secondary | ICD-10-CM | POA: Diagnosis not present

## 2023-02-02 DIAGNOSIS — K5900 Constipation, unspecified: Secondary | ICD-10-CM | POA: Diagnosis not present

## 2023-02-02 DIAGNOSIS — G629 Polyneuropathy, unspecified: Secondary | ICD-10-CM | POA: Diagnosis not present

## 2023-02-02 DIAGNOSIS — R600 Localized edema: Secondary | ICD-10-CM | POA: Diagnosis not present

## 2023-02-02 DIAGNOSIS — L853 Xerosis cutis: Secondary | ICD-10-CM | POA: Diagnosis not present

## 2023-02-02 DIAGNOSIS — I998 Other disorder of circulatory system: Secondary | ICD-10-CM | POA: Diagnosis not present

## 2023-02-02 DIAGNOSIS — I4891 Unspecified atrial fibrillation: Secondary | ICD-10-CM | POA: Diagnosis not present

## 2023-02-02 DIAGNOSIS — E785 Hyperlipidemia, unspecified: Secondary | ICD-10-CM | POA: Diagnosis not present

## 2023-02-02 DIAGNOSIS — K2100 Gastro-esophageal reflux disease with esophagitis, without bleeding: Secondary | ICD-10-CM | POA: Diagnosis not present

## 2023-02-02 DIAGNOSIS — H9193 Unspecified hearing loss, bilateral: Secondary | ICD-10-CM | POA: Diagnosis not present

## 2023-02-02 DIAGNOSIS — G4700 Insomnia, unspecified: Secondary | ICD-10-CM | POA: Diagnosis not present

## 2023-02-02 DIAGNOSIS — R2681 Unsteadiness on feet: Secondary | ICD-10-CM | POA: Diagnosis not present

## 2023-02-02 DIAGNOSIS — G4733 Obstructive sleep apnea (adult) (pediatric): Secondary | ICD-10-CM | POA: Diagnosis not present

## 2023-02-02 DIAGNOSIS — K567 Ileus, unspecified: Secondary | ICD-10-CM | POA: Diagnosis not present

## 2023-02-05 DIAGNOSIS — K5900 Constipation, unspecified: Secondary | ICD-10-CM | POA: Diagnosis not present

## 2023-02-05 DIAGNOSIS — L853 Xerosis cutis: Secondary | ICD-10-CM | POA: Diagnosis not present

## 2023-02-05 DIAGNOSIS — G4733 Obstructive sleep apnea (adult) (pediatric): Secondary | ICD-10-CM | POA: Diagnosis not present

## 2023-02-05 DIAGNOSIS — I10 Essential (primary) hypertension: Secondary | ICD-10-CM | POA: Diagnosis not present

## 2023-02-05 DIAGNOSIS — K567 Ileus, unspecified: Secondary | ICD-10-CM | POA: Diagnosis not present

## 2023-02-05 DIAGNOSIS — G629 Polyneuropathy, unspecified: Secondary | ICD-10-CM | POA: Diagnosis not present

## 2023-02-05 DIAGNOSIS — E785 Hyperlipidemia, unspecified: Secondary | ICD-10-CM | POA: Diagnosis not present

## 2023-02-05 DIAGNOSIS — G4700 Insomnia, unspecified: Secondary | ICD-10-CM | POA: Diagnosis not present

## 2023-02-05 DIAGNOSIS — R600 Localized edema: Secondary | ICD-10-CM | POA: Diagnosis not present

## 2023-02-05 DIAGNOSIS — N189 Chronic kidney disease, unspecified: Secondary | ICD-10-CM | POA: Diagnosis not present

## 2023-02-05 DIAGNOSIS — K2100 Gastro-esophageal reflux disease with esophagitis, without bleeding: Secondary | ICD-10-CM | POA: Diagnosis not present

## 2023-02-05 DIAGNOSIS — I4891 Unspecified atrial fibrillation: Secondary | ICD-10-CM | POA: Diagnosis not present

## 2023-02-09 ENCOUNTER — Inpatient Hospital Stay: Payer: Non-veteran care | Admitting: Pulmonary Disease

## 2023-02-12 DIAGNOSIS — R600 Localized edema: Secondary | ICD-10-CM | POA: Diagnosis not present

## 2023-02-12 DIAGNOSIS — E785 Hyperlipidemia, unspecified: Secondary | ICD-10-CM | POA: Diagnosis not present

## 2023-02-12 DIAGNOSIS — K5900 Constipation, unspecified: Secondary | ICD-10-CM | POA: Diagnosis not present

## 2023-02-12 DIAGNOSIS — G4700 Insomnia, unspecified: Secondary | ICD-10-CM | POA: Diagnosis not present

## 2023-02-12 DIAGNOSIS — I4891 Unspecified atrial fibrillation: Secondary | ICD-10-CM | POA: Diagnosis not present

## 2023-02-12 DIAGNOSIS — D649 Anemia, unspecified: Secondary | ICD-10-CM | POA: Diagnosis not present

## 2023-02-12 DIAGNOSIS — K2100 Gastro-esophageal reflux disease with esophagitis, without bleeding: Secondary | ICD-10-CM | POA: Diagnosis not present

## 2023-02-12 DIAGNOSIS — G4733 Obstructive sleep apnea (adult) (pediatric): Secondary | ICD-10-CM | POA: Diagnosis not present

## 2023-02-12 DIAGNOSIS — E222 Syndrome of inappropriate secretion of antidiuretic hormone: Secondary | ICD-10-CM | POA: Diagnosis not present

## 2023-02-12 DIAGNOSIS — I10 Essential (primary) hypertension: Secondary | ICD-10-CM | POA: Diagnosis not present

## 2023-02-12 DIAGNOSIS — J449 Chronic obstructive pulmonary disease, unspecified: Secondary | ICD-10-CM | POA: Diagnosis not present

## 2023-02-12 DIAGNOSIS — I998 Other disorder of circulatory system: Secondary | ICD-10-CM | POA: Diagnosis not present

## 2023-02-13 ENCOUNTER — Ambulatory Visit (HOSPITAL_COMMUNITY)
Admission: RE | Admit: 2023-02-13 | Discharge: 2023-02-13 | Disposition: A | Discharge status: Home / Self Care | Payer: Medicare Other | Source: Ambulatory Visit | Attending: Vascular Surgery | Admitting: Vascular Surgery

## 2023-02-13 ENCOUNTER — Ambulatory Visit: Payer: Medicare Other | Admitting: Physician Assistant

## 2023-02-13 VITALS — BP 153/60 | HR 69 | Temp 97.9°F | Wt 160.0 lb

## 2023-02-13 DIAGNOSIS — I998 Other disorder of circulatory system: Secondary | ICD-10-CM

## 2023-02-13 DIAGNOSIS — K5900 Constipation, unspecified: Secondary | ICD-10-CM | POA: Diagnosis not present

## 2023-02-13 DIAGNOSIS — I7409 Other arterial embolism and thrombosis of abdominal aorta: Secondary | ICD-10-CM

## 2023-02-13 DIAGNOSIS — G629 Polyneuropathy, unspecified: Secondary | ICD-10-CM | POA: Diagnosis not present

## 2023-02-13 DIAGNOSIS — R600 Localized edema: Secondary | ICD-10-CM | POA: Diagnosis not present

## 2023-02-13 DIAGNOSIS — I739 Peripheral vascular disease, unspecified: Secondary | ICD-10-CM | POA: Diagnosis not present

## 2023-02-13 DIAGNOSIS — G4733 Obstructive sleep apnea (adult) (pediatric): Secondary | ICD-10-CM | POA: Diagnosis not present

## 2023-02-13 DIAGNOSIS — J449 Chronic obstructive pulmonary disease, unspecified: Secondary | ICD-10-CM | POA: Diagnosis not present

## 2023-02-13 DIAGNOSIS — L853 Xerosis cutis: Secondary | ICD-10-CM | POA: Diagnosis not present

## 2023-02-13 DIAGNOSIS — E785 Hyperlipidemia, unspecified: Secondary | ICD-10-CM | POA: Diagnosis not present

## 2023-02-13 DIAGNOSIS — K2100 Gastro-esophageal reflux disease with esophagitis, without bleeding: Secondary | ICD-10-CM | POA: Diagnosis not present

## 2023-02-13 DIAGNOSIS — I10 Essential (primary) hypertension: Secondary | ICD-10-CM | POA: Diagnosis not present

## 2023-02-13 DIAGNOSIS — K567 Ileus, unspecified: Secondary | ICD-10-CM | POA: Diagnosis not present

## 2023-02-13 DIAGNOSIS — E222 Syndrome of inappropriate secretion of antidiuretic hormone: Secondary | ICD-10-CM | POA: Diagnosis not present

## 2023-02-13 DIAGNOSIS — I4891 Unspecified atrial fibrillation: Secondary | ICD-10-CM | POA: Diagnosis not present

## 2023-02-13 LAB — VAS US ABI WITH/WO TBI
Left ABI: 0.67
Right ABI: 0.65

## 2023-02-13 NOTE — Progress Notes (Signed)
[facility] [place] HISTORY AND PHYSICAL   History of Present Illness:  Patient is a 89 y.o. year old male who presents for evaluation of left LE.  He was recently hospitalized with an ischemic left LE.  He was schedule for primary amputation and placed on Heparin.  Prior to the amputation he regained inflow to the left LE and was reportedly pain free.  He is here for surveillance follow up. He denise claudication at short distance,  no non healing wounds, or rest pain.  He is ambulatory and resides at an assistive living.    He was discharged on medical therapy of Eliquis, ASA and Statin.  He is tolerating this well.   Past Medical History:  Diagnosis Date   Bilateral hearing loss    COPD (chronic obstructive pulmonary disease) (HCC)    DVT (deep venous thrombosis) (HCC)    GERD (gastroesophageal reflux disease)    History of pulmonary embolus (PE)    HLD (hyperlipidemia)    HTN (hypertension)    Melanoma (HCC)    NSTEMI (non-ST elevated myocardial infarction) (HCC)    OSA (obstructive sleep apnea)    Peripheral neuropathy    Squamous cell skin cancer     Past Surgical History:  Procedure Laterality Date   BIOPSY  11/07/2021   Procedure: BIOPSY;  Surgeon: Doran Stabler, MD;  Location: [facility];  Service: Gastroenterology;;   ESOPHAGOGASTRODUODENOSCOPY (EGD) WITH PROPOFOL N/A 11/07/2021   Procedure: ESOPHAGOGASTRODUODENOSCOPY (EGD) WITH PROPOFOL;  Surgeon: Doran Stabler, MD;  Location: [facility];  Service: Gastroenterology;  Laterality: N/A;   PERCUTANEOUS CORONARY STENT INTERVENTION (PCI-S)     RCA    ROS:   General:  No weight loss, Fever, chills  HEENT: No recent headaches, no nasal bleeding, no visual changes, no sore throat  Neurologic: No dizziness, blackouts, seizures. No recent symptoms of stroke or mini- stroke. No recent episodes of slurred speech, or temporary blindness.  Cardiac: No recent episodes of chest pain/pressure, no  shortness of breath at rest.  No shortness of breath with exertion.  Denies history of atrial fibrillation or irregular heartbeat  Vascular: No history of rest pain in feet.  No history of claudication.  No history of non-healing ulcer, No history of DVT   Pulmonary: No home oxygen, no productive cough, no hemoptysis,  No asthma or wheezing  Musculoskeletal:  '[ ]'$  Arthritis, '[ ]'$  Low back pain,  '[ ]'$  Joint pain  Hematologic:No history of hypercoagulable state.  No history of easy bleeding.  No history of anemia  Gastrointestinal: No hematochezia or melena,  No gastroesophageal reflux, no trouble swallowing  Urinary: '[ ]'$  chronic Kidney disease, '[ ]'$  on HD - '[ ]'$  MWF or '[ ]'$  TTHS, '[ ]'$  Burning with urination, '[ ]'$  Frequent urination, '[ ]'$  Difficulty urinating;   Skin: No rashes  Psychological: No history of anxiety,  No history of depression  Social History Social History   Tobacco Use   Smoking status: Former    Types: Cigarettes    Quit date: 1980    Years since quitting: 44.2   Smokeless tobacco: Never  Substance Use Topics   Alcohol use: Not Currently   Drug use: Never    Family History Family History  Problem Relation Age of Onset   Cancer Mother     Allergies  Allergies  Allergen Reactions   Brilinta [Ticagrelor] Other (See Comments)    GI Hemorrhage   Coffea Arabica  DECAF coffee causes vision changes    Tetanus Toxoids Other (See Comments)    "Horse serum only" Unknown reaction Documented on MAR    Zocor [Simvastatin] Other (See Comments)    Myalgias    Spiriva Respimat [Tiotropium Bromide Monohydrate] Other (See Comments)    Urinary retention    Niacin Other (See Comments)    Flushing       Current Outpatient Medications  Medication Sig Dispense Refill   acetaminophen (TYLENOL) 500 MG tablet Take 1,000 mg by mouth every 8 (eight) hours as needed for fever.     Aclidinium Bromide (TUDORZA PRESSAIR) 400 MCG/ACT AEPB Inhale 1 puff into the lungs in the  morning and at bedtime.     albuterol (VENTOLIN HFA) 108 (90 Base) MCG/ACT inhaler Inhale 2 puffs into the lungs every 6 (six) hours as needed for wheezing or shortness of breath.     apixaban (ELIQUIS) 5 MG TABS tablet Take 1 tablet (5 mg total) by mouth 2 (two) times daily. 60 tablet 0   ascorbic acid (VITAMIN C) 500 MG tablet Take 500 mg by mouth daily.     aspirin EC 81 MG tablet Take 81 mg by mouth daily.     atorvastatin (LIPITOR) 40 MG tablet Take 0.5 tablets (20 mg total) by mouth daily.     Brinzolamide-Brimonidine (SIMBRINZA) 1-0.2 % SUSP Place 1 drop into both eyes 2 (two) times daily.     Calcium Carb-Cholecalciferol (CALCIUM 500 +D) 500-10 MG-MCG TABS Take 1 tablet by mouth daily.     carboxymethylcellulose (REFRESH TEARS) 0.5 % SOLN Place 1 drop into both eyes 4 (four) times daily.     cholecalciferol 25 MCG (1000 UT) tablet Take 1,000 Units by mouth daily.     docusate sodium (COLACE) 100 MG capsule Take 100 mg by mouth 2 (two) times daily.     ferrous sulfate 325 (65 FE) MG EC tablet Take 325 mg by mouth every Monday, Wednesday, and Friday.     fluticasone-salmeterol (WIXELA INHUB) 250-50 MCG/ACT AEPB Inhale 1 puff into the lungs in the morning and at bedtime.     furosemide (LASIX) 20 MG tablet Take 20 mg by mouth See admin instructions. 2 entries on MAR: 20 mg once a morning for 3 days (01/16/23-01/18/23) + 20 mg once daily as needed for signs of volume overload (gain of 3 lbs over night or 5 lbs in 1 week, increased shortness of breath and/or increased leg swelling)     gabapentin (NEURONTIN) 300 MG capsule Take 1 capsule (300 mg total) by mouth 2 (two) times daily. 60 capsule 0   guaiFENesin (MUCINEX) 600 MG 12 hr tablet Take 600 mg by mouth every 12 (twelve) hours.     ipratropium-albuterol (DUONEB) 0.5-2.5 (3) MG/3ML SOLN Take 3 mLs by nebulization every 6 (six) hours.     latanoprost (XALATAN) 0.005 % ophthalmic solution Place 1 drop into both eyes at bedtime.     lisinopril  (ZESTRIL) 10 MG tablet Take 5 mg by mouth See admin instructions. 5 mg once daily. HOLD for SBP < 100.     Lutein 10 MG TABS Take 10 mg by mouth daily.     Multiple Vitamin (MULTIVITAMIN) tablet Take 1 tablet by mouth daily. Multivitamin + Folic acid [id number] mcg.     Neomycin-Bacitracin-Polymyxin (TRIPLE ANTIBIOTIC) OINT Apply 1 Application topically 2 (two) times daily as needed (minor wounds/cuts).     nystatin powder Apply 1 Application topically 2 (two) times daily as needed (irritation). To  groin area     pantoprazole (PROTONIX) 40 MG tablet Take 1 tablet (40 mg total) by mouth 2 (two) times daily before a meal. (Patient taking differently: Take 40 mg by mouth 2 (two) times daily.)     Probiotic Product (RISA-BID PROBIOTIC) TABS Take 1 tablet by mouth daily.     senna (SENOKOT) 8.6 MG TABS tablet Take 17.2 mg by mouth daily as needed (constipation).     Skin Protectants, Misc. (MINERIN CREME) CREA Apply 1 Application topically daily. To legs and back.     sodium chloride 1 g tablet Take 1 tablet (1 g total) by mouth 3 (three) times daily with meals.     tamsulosin (FLOMAX) 0.4 MG CAPS capsule Take 2 capsules (0.8 mg total) by mouth at bedtime. 30 capsule 0   traZODone (DESYREL) 50 MG tablet Take 0.5 tablets (25 mg total) by mouth at bedtime.     zinc oxide 20 % ointment Apply 1 Application topically as needed for irritation. To buttocks.     No current facility-administered medications for this visit.    Physical Examination  Vitals:   02/13/23 1003  BP: (!) 153/60  Pulse: 69  Temp: 97.9 F (36.6 C)  TempSrc: Temporal  SpO2: 98%  Weight: 160 lb (72.6 kg)    Body mass index is 22.32 kg/m.  General:  Alert and oriented, no acute distress HEENT: Normal Neck: No bruit or JVD Pulmonary: Clear to auscultation bilaterally Cardiac: Regular Rate and Rhythm without murmur Abdomen: Soft, non-tender, non-distended, no mass, no scars Skin: No rash Extremity Pulses:   radial, Doppler  signals dorsalis pedis, posterior tibial  bilaterally Musculoskeletal: positive left > right edema, no ischemic changes  Neurologic: Upper and lower extremity motor grossly intact and symmetric  DATA:  ABI Findings:  +---------+------------------+-----+--------+--------+  Right   Rt Pressure (mmHg)IndexWaveformComment   +---------+------------------+-----+--------+--------+  Brachial 152                                      +---------+------------------+-----+--------+--------+  PTA     96                0.59 biphasic          +---------+------------------+-----+--------+--------+  DP      106               0.65 biphasic          +---------+------------------+-----+--------+--------+  Great Toe71                0.44 Abnormal          +---------+------------------+-----+--------+--------+   +---------+------------------+-----+--------+-------+  Left    Lt Pressure (mmHg)IndexWaveformComment  +---------+------------------+-----+--------+-------+  Brachial 163                                     +---------+------------------+-----+--------+-------+  PTA     109               0.67 biphasic         +---------+------------------+-----+--------+-------+  DP      94                0.58 biphasic         +---------+------------------+-----+--------+-------+  Great Toe92                0.56 Abnormal         +---------+------------------+-----+--------+-------+   +-------+-----------+-----------+------------+------------+  ABI/TBIToday's ABIToday's TBIPrevious ABIPrevious TBI  +-------+-----------+-----------+------------+------------+  Right 0.65       0.44                                 +-------+-----------+-----------+------------+------------+  Left  0.67       0.56                                 +-------+-----------+-----------+------------+------------+       Summary:  Right: Resting right  ankle-brachial index indicates moderate right lower  extremity arterial disease. The right toe-brachial index is abnormal.   Left: Resting left ankle-brachial index indicates moderate left lower  extremity arterial disease. The left toe-brachial index is abnormal.          ASSESSMENT/PLAN:  Recent history of left LE ischemia resolved on IV heparin.  He is here for surveillance.  His ABI's are stable B LE right 0.65, left 0.67.  He is significantly improved and ambulatory without return of rest pain.    His son is with him today and is pleased as well.   Activity as tolerates.  I encouraged safe ambulation to prevent falls since he is on anticoagulation medication.  He will f/u in 1 year for continued surveillance they will call if they have new symptoms of ischemia.   He was discharged home on ASA and Eliquis with a daily Statin.       Roxy Horseman PA-C [facility] [place] Office: [phone]  MD in clinic [place]

## 2023-02-20 DIAGNOSIS — E119 Type 2 diabetes mellitus without complications: Secondary | ICD-10-CM | POA: Diagnosis not present

## 2023-02-22 DIAGNOSIS — I871 Compression of vein: Secondary | ICD-10-CM | POA: Diagnosis not present

## 2023-02-22 DIAGNOSIS — E119 Type 2 diabetes mellitus without complications: Secondary | ICD-10-CM | POA: Diagnosis not present

## 2023-02-23 DIAGNOSIS — R0602 Shortness of breath: Secondary | ICD-10-CM | POA: Diagnosis not present

## 2023-02-24 DIAGNOSIS — M6281 Muscle weakness (generalized): Secondary | ICD-10-CM | POA: Diagnosis not present

## 2023-02-25 DIAGNOSIS — M6281 Muscle weakness (generalized): Secondary | ICD-10-CM | POA: Diagnosis not present

## 2023-02-26 DIAGNOSIS — N390 Urinary tract infection, site not specified: Secondary | ICD-10-CM | POA: Diagnosis not present

## 2023-02-26 DIAGNOSIS — E222 Syndrome of inappropriate secretion of antidiuretic hormone: Secondary | ICD-10-CM | POA: Diagnosis not present

## 2023-02-26 DIAGNOSIS — I4891 Unspecified atrial fibrillation: Secondary | ICD-10-CM | POA: Diagnosis not present

## 2023-02-26 DIAGNOSIS — I10 Essential (primary) hypertension: Secondary | ICD-10-CM | POA: Diagnosis not present

## 2023-02-26 DIAGNOSIS — J449 Chronic obstructive pulmonary disease, unspecified: Secondary | ICD-10-CM | POA: Diagnosis not present

## 2023-02-26 DIAGNOSIS — G4700 Insomnia, unspecified: Secondary | ICD-10-CM | POA: Diagnosis not present

## 2023-02-26 DIAGNOSIS — I998 Other disorder of circulatory system: Secondary | ICD-10-CM | POA: Diagnosis not present

## 2023-02-26 DIAGNOSIS — M6281 Muscle weakness (generalized): Secondary | ICD-10-CM | POA: Diagnosis not present

## 2023-02-26 DIAGNOSIS — K5900 Constipation, unspecified: Secondary | ICD-10-CM | POA: Diagnosis not present

## 2023-02-26 DIAGNOSIS — K2100 Gastro-esophageal reflux disease with esophagitis, without bleeding: Secondary | ICD-10-CM | POA: Diagnosis not present

## 2023-02-26 DIAGNOSIS — R0781 Pleurodynia: Secondary | ICD-10-CM | POA: Diagnosis not present

## 2023-02-26 DIAGNOSIS — R600 Localized edema: Secondary | ICD-10-CM | POA: Diagnosis not present

## 2023-02-26 DIAGNOSIS — E785 Hyperlipidemia, unspecified: Secondary | ICD-10-CM | POA: Diagnosis not present

## 2023-02-27 DIAGNOSIS — I10 Essential (primary) hypertension: Secondary | ICD-10-CM | POA: Diagnosis not present

## 2023-02-27 DIAGNOSIS — M6281 Muscle weakness (generalized): Secondary | ICD-10-CM | POA: Diagnosis not present

## 2023-02-28 DIAGNOSIS — M6281 Muscle weakness (generalized): Secondary | ICD-10-CM | POA: Diagnosis not present

## 2023-03-01 DIAGNOSIS — J449 Chronic obstructive pulmonary disease, unspecified: Secondary | ICD-10-CM | POA: Diagnosis not present

## 2023-03-01 DIAGNOSIS — I10 Essential (primary) hypertension: Secondary | ICD-10-CM | POA: Diagnosis not present

## 2023-03-01 DIAGNOSIS — N189 Chronic kidney disease, unspecified: Secondary | ICD-10-CM | POA: Diagnosis not present

## 2023-03-01 DIAGNOSIS — E785 Hyperlipidemia, unspecified: Secondary | ICD-10-CM | POA: Diagnosis not present

## 2023-03-01 DIAGNOSIS — I4891 Unspecified atrial fibrillation: Secondary | ICD-10-CM | POA: Diagnosis not present

## 2023-03-05 DIAGNOSIS — M6281 Muscle weakness (generalized): Secondary | ICD-10-CM | POA: Diagnosis not present

## 2023-03-07 DIAGNOSIS — E871 Hypo-osmolality and hyponatremia: Secondary | ICD-10-CM | POA: Diagnosis not present

## 2023-03-07 DIAGNOSIS — M6281 Muscle weakness (generalized): Secondary | ICD-10-CM | POA: Diagnosis not present

## 2023-03-08 DIAGNOSIS — E785 Hyperlipidemia, unspecified: Secondary | ICD-10-CM | POA: Diagnosis not present

## 2023-03-08 DIAGNOSIS — N189 Chronic kidney disease, unspecified: Secondary | ICD-10-CM | POA: Diagnosis not present

## 2023-03-08 DIAGNOSIS — R509 Fever, unspecified: Secondary | ICD-10-CM | POA: Diagnosis not present

## 2023-03-08 DIAGNOSIS — M6281 Muscle weakness (generalized): Secondary | ICD-10-CM | POA: Diagnosis not present

## 2023-03-08 DIAGNOSIS — J449 Chronic obstructive pulmonary disease, unspecified: Secondary | ICD-10-CM | POA: Diagnosis not present

## 2023-03-08 DIAGNOSIS — R531 Weakness: Secondary | ICD-10-CM | POA: Diagnosis not present

## 2023-03-08 DIAGNOSIS — G4733 Obstructive sleep apnea (adult) (pediatric): Secondary | ICD-10-CM | POA: Diagnosis not present

## 2023-03-08 DIAGNOSIS — R079 Chest pain, unspecified: Secondary | ICD-10-CM | POA: Diagnosis not present

## 2023-03-08 DIAGNOSIS — I4891 Unspecified atrial fibrillation: Secondary | ICD-10-CM | POA: Diagnosis not present

## 2023-03-08 DIAGNOSIS — I10 Essential (primary) hypertension: Secondary | ICD-10-CM | POA: Diagnosis not present

## 2023-03-08 DIAGNOSIS — R000 Tachycardia, unspecified: Secondary | ICD-10-CM | POA: Diagnosis not present

## 2023-03-08 DIAGNOSIS — J9601 Acute respiratory failure with hypoxia: Secondary | ICD-10-CM | POA: Diagnosis not present

## 2023-03-09 DIAGNOSIS — M6281 Muscle weakness (generalized): Secondary | ICD-10-CM | POA: Diagnosis not present

## 2023-03-09 DIAGNOSIS — R509 Fever, unspecified: Secondary | ICD-10-CM | POA: Diagnosis not present

## 2023-03-12 DIAGNOSIS — R600 Localized edema: Secondary | ICD-10-CM | POA: Diagnosis not present

## 2023-03-12 DIAGNOSIS — G629 Polyneuropathy, unspecified: Secondary | ICD-10-CM | POA: Diagnosis not present

## 2023-03-12 DIAGNOSIS — K5900 Constipation, unspecified: Secondary | ICD-10-CM | POA: Diagnosis not present

## 2023-03-12 DIAGNOSIS — I4891 Unspecified atrial fibrillation: Secondary | ICD-10-CM | POA: Diagnosis not present

## 2023-03-12 DIAGNOSIS — E871 Hypo-osmolality and hyponatremia: Secondary | ICD-10-CM | POA: Diagnosis not present

## 2023-03-12 DIAGNOSIS — G4733 Obstructive sleep apnea (adult) (pediatric): Secondary | ICD-10-CM | POA: Diagnosis not present

## 2023-03-12 DIAGNOSIS — G4700 Insomnia, unspecified: Secondary | ICD-10-CM | POA: Diagnosis not present

## 2023-03-12 DIAGNOSIS — M6281 Muscle weakness (generalized): Secondary | ICD-10-CM | POA: Diagnosis not present

## 2023-03-12 DIAGNOSIS — J449 Chronic obstructive pulmonary disease, unspecified: Secondary | ICD-10-CM | POA: Diagnosis not present

## 2023-03-12 DIAGNOSIS — K2100 Gastro-esophageal reflux disease with esophagitis, without bleeding: Secondary | ICD-10-CM | POA: Diagnosis not present

## 2023-03-12 DIAGNOSIS — I10 Essential (primary) hypertension: Secondary | ICD-10-CM | POA: Diagnosis not present

## 2023-03-12 DIAGNOSIS — E785 Hyperlipidemia, unspecified: Secondary | ICD-10-CM | POA: Diagnosis not present

## 2023-03-13 DIAGNOSIS — M6281 Muscle weakness (generalized): Secondary | ICD-10-CM | POA: Diagnosis not present

## 2023-03-13 DIAGNOSIS — N390 Urinary tract infection, site not specified: Secondary | ICD-10-CM | POA: Diagnosis not present

## 2023-03-14 DIAGNOSIS — M6281 Muscle weakness (generalized): Secondary | ICD-10-CM | POA: Diagnosis not present

## 2023-03-15 DIAGNOSIS — M6281 Muscle weakness (generalized): Secondary | ICD-10-CM | POA: Diagnosis not present

## 2023-03-16 DIAGNOSIS — M6281 Muscle weakness (generalized): Secondary | ICD-10-CM | POA: Diagnosis not present

## 2023-03-16 DIAGNOSIS — I10 Essential (primary) hypertension: Secondary | ICD-10-CM | POA: Diagnosis not present

## 2023-03-19 DIAGNOSIS — M6281 Muscle weakness (generalized): Secondary | ICD-10-CM | POA: Diagnosis not present

## 2023-03-19 IMAGING — US US ABDOMEN LIMITED
1 series · 14 of 25 positions shown · non-contrast
Comparison: CT scan, same date.

CLINICAL DATA: Cholelithiasis

EXAM:
ULTRASOUND ABDOMEN LIMITED RIGHT UPPER QUADRANT

[Series 1: us abdomen limited ruq (liver/gb) · 14 of 44 slices shown]
[im 1/44]
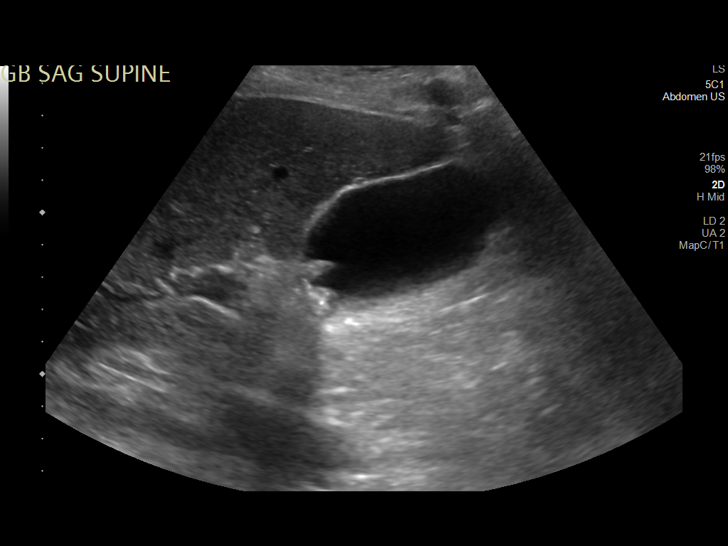
[im 4/44]
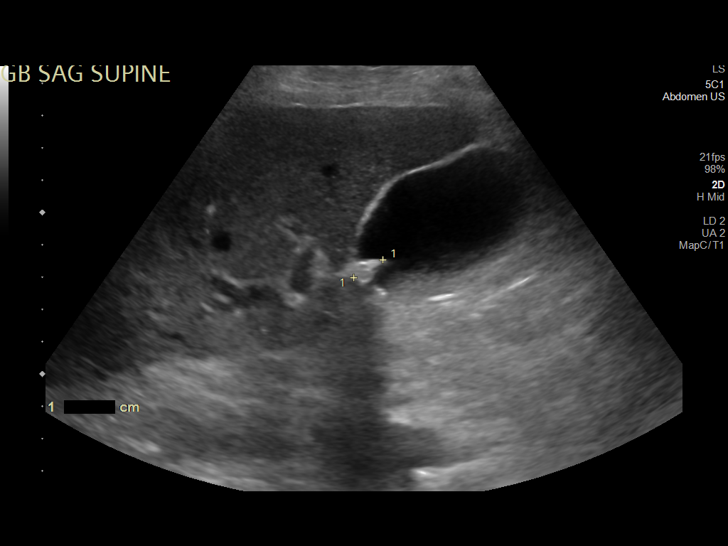
[im 8/44]
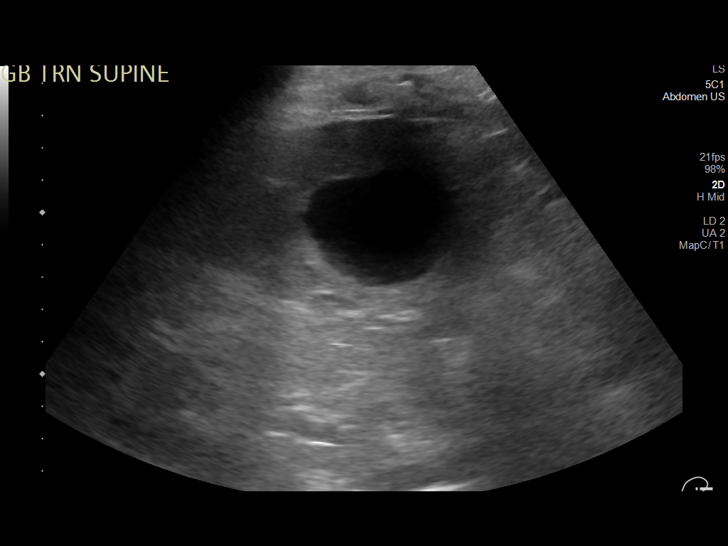
[im 11/44]
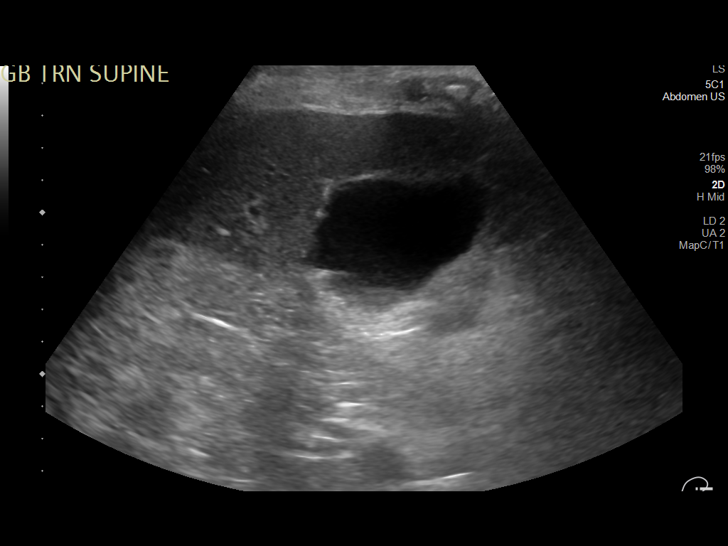
[im 15/44]
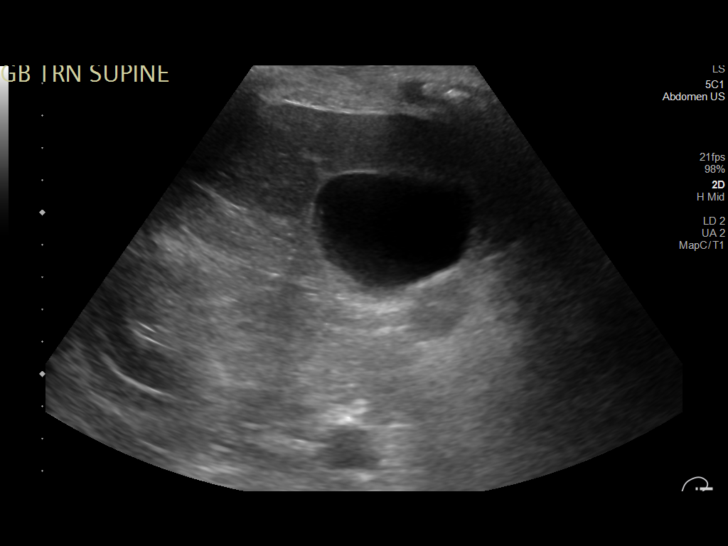
[im 17/44]
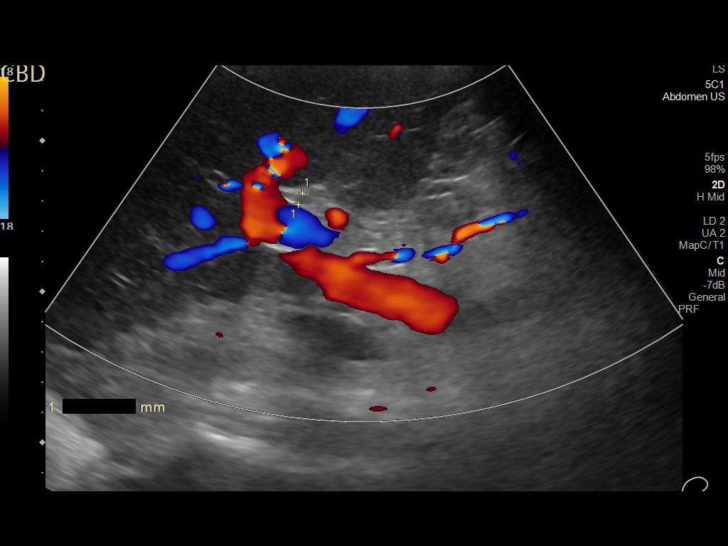
[im 20/44]
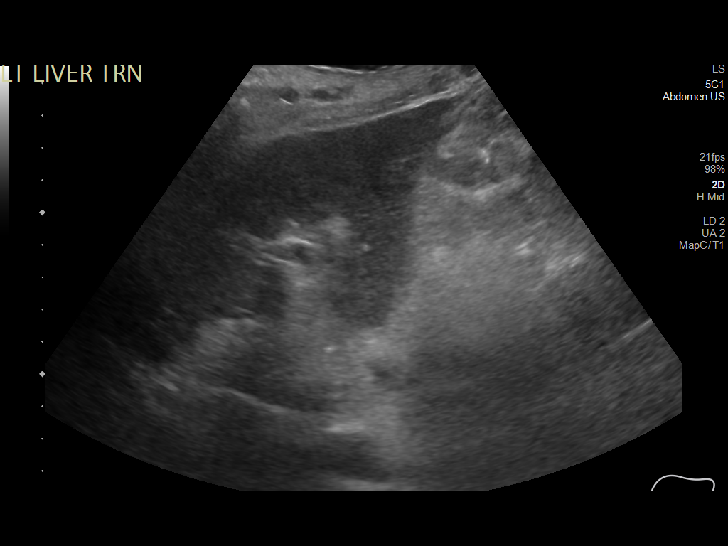
[im 24/44]
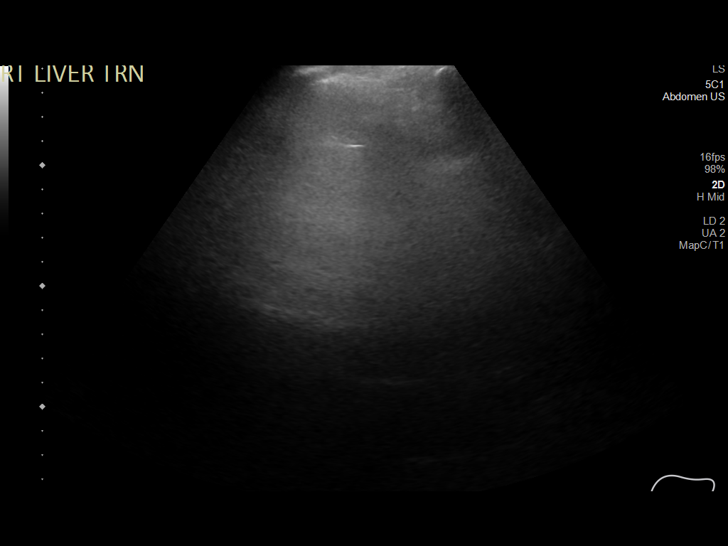
[im 27/44]
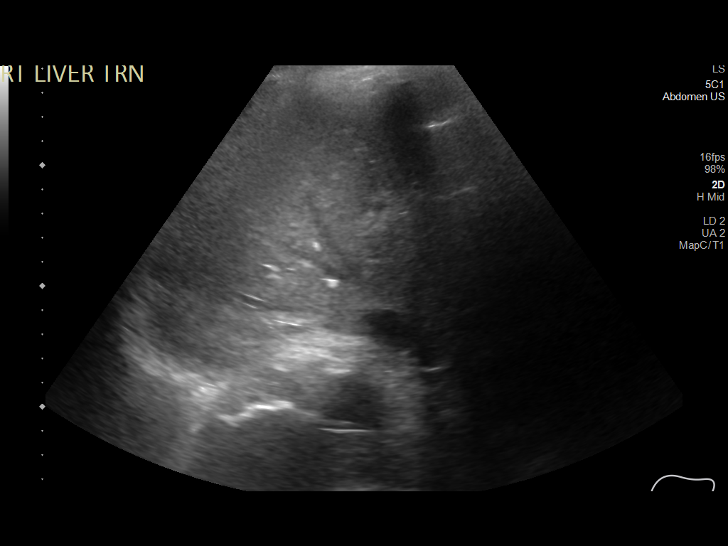
[im 29/44]
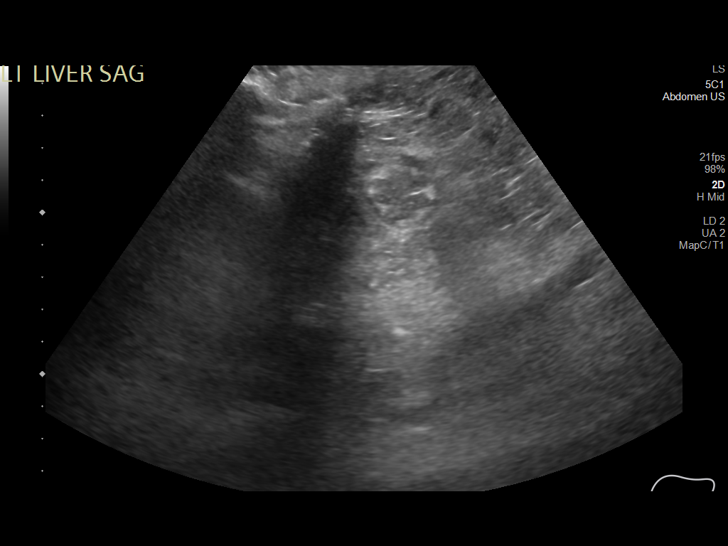
[im 33/44]
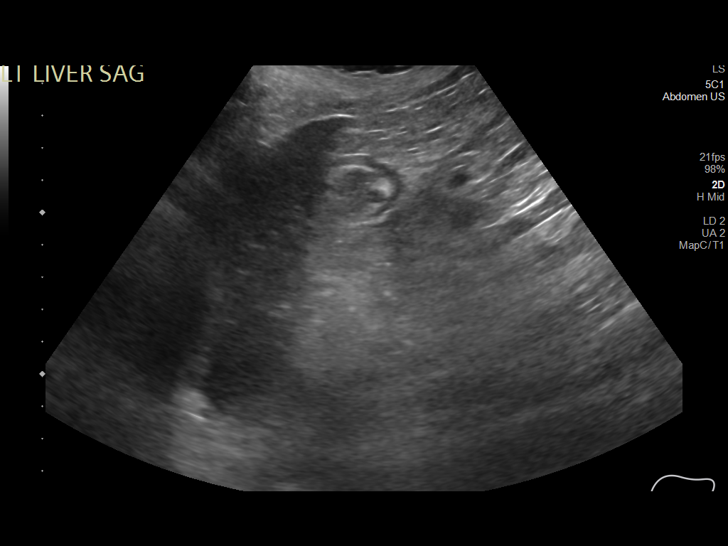
[im 36/44]
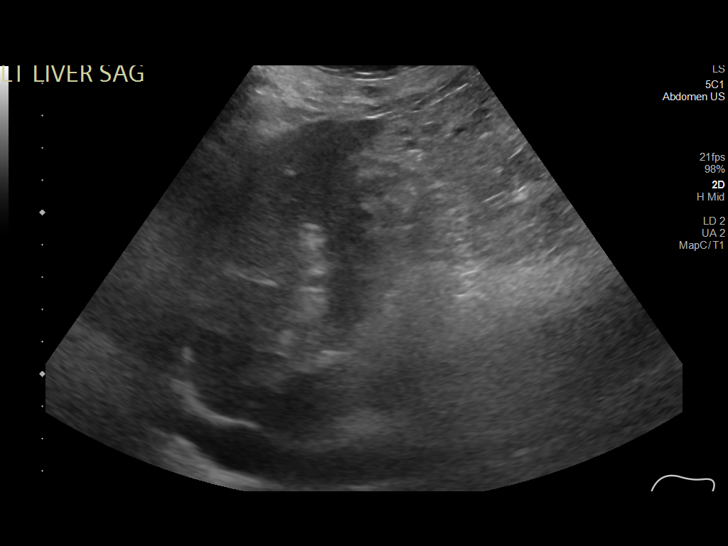
[im 40/44]
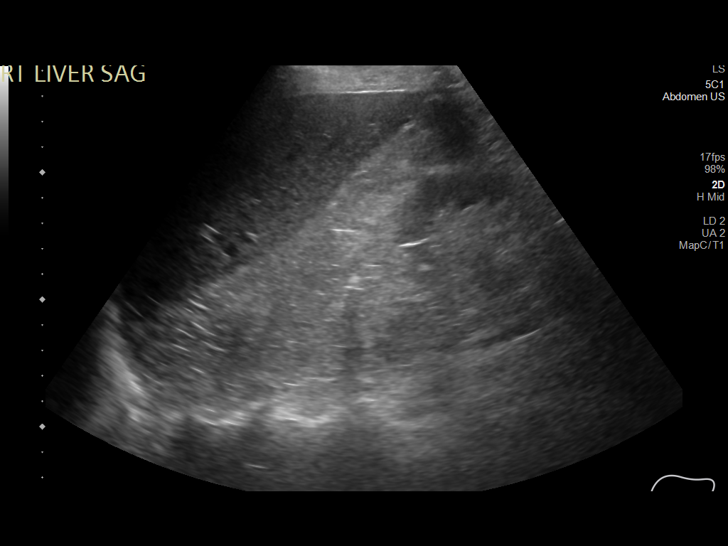
[im 44/44]
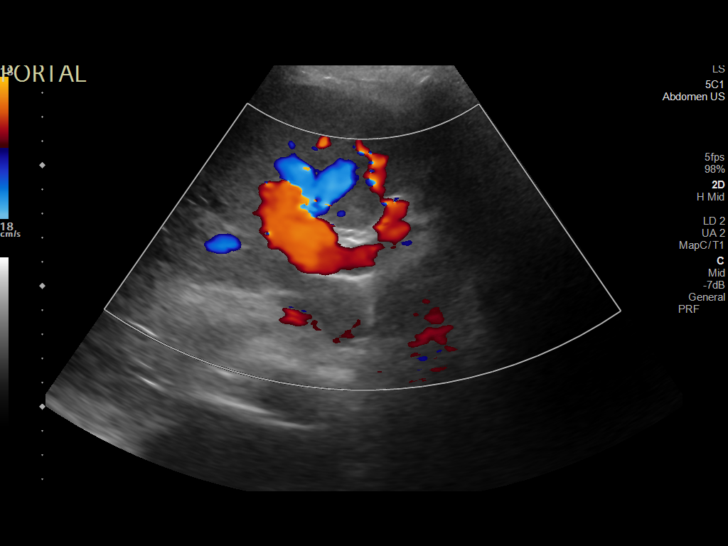

[14 of 25 positions shown; findings below may reference images not displayed]

FINDINGS: Gallbladder:

Small echogenic shadowing gallstones are noted. The largest
gallstone measures 1.1 cm. No gallbladder wall thickening,
pericholecystic fluid or sonographic Murphy sign to suggest acute
cholecystitis.

Common bile duct:

Diameter: 4.0 mm

Liver:

Normal echogenicity without focal lesion or biliary dilatation.
Portal vein is patent on color Doppler imaging with normal direction
of blood flow towards the liver.

Other: None.
IMPRESSION: 1. Cholelithiasis without sonographic findings for acute
cholecystitis.
2. No intra or extrahepatic biliary dilatation.

## 2023-03-19 IMAGING — CT CT CHEST-ABD-PELV W/O CM
2 of 5 series · 15 of 46 positions shown, 17 images · non-contrast
Comparison: None

CLINICAL DATA: Altered mental status, weakness, acute abdominal
pain, hyponatremia, abdominal distension. History COPD, DVT, GERD,
hypertension, melanoma, NSTEMI, former smoker

EXAM:
CT CHEST, ABDOMEN AND PELVIS WITHOUT CONTRAST
TECHNIQUE: Multidetector CT imaging of the chest, abdomen and pelvis was
performed following the standard protocol without IV contrast.

[Series 5: cap w/o 2.0 mm st · axial · non-contrast · 0.95mm/px · z∈[+660,+1252]mm · 12 of 340 slices shown, 14 images]
[im 22/340  soft-tissue]
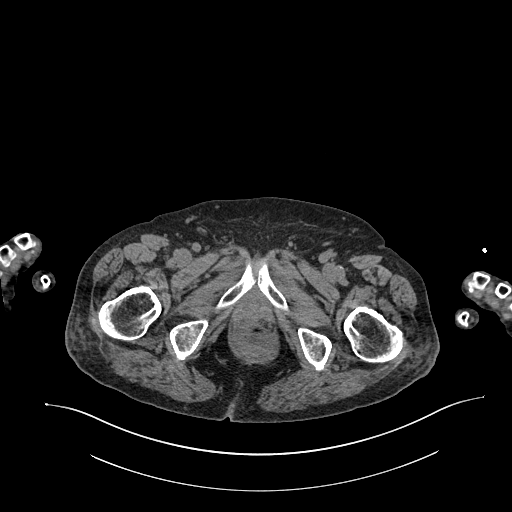
[im 22/340  bone]
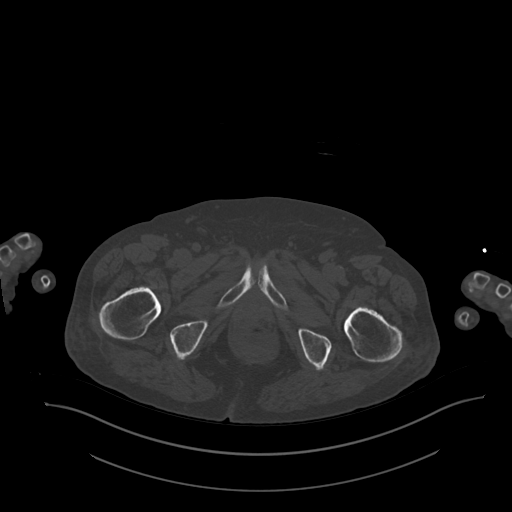
[im 43/340  soft-tissue]
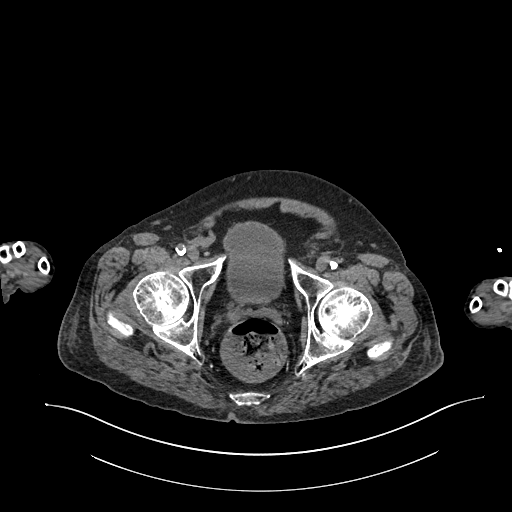
[im 85/340  soft-tissue]
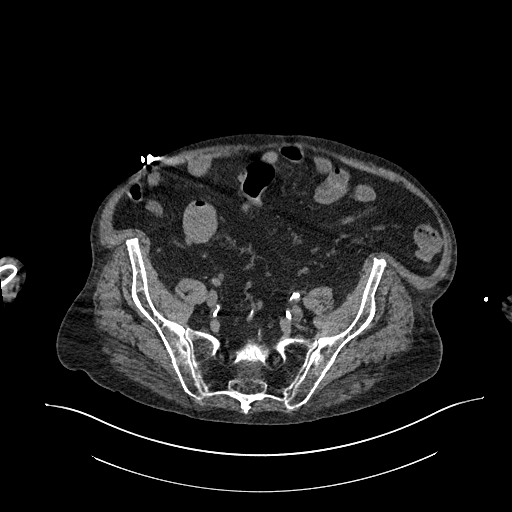
[im 106/340  soft-tissue]
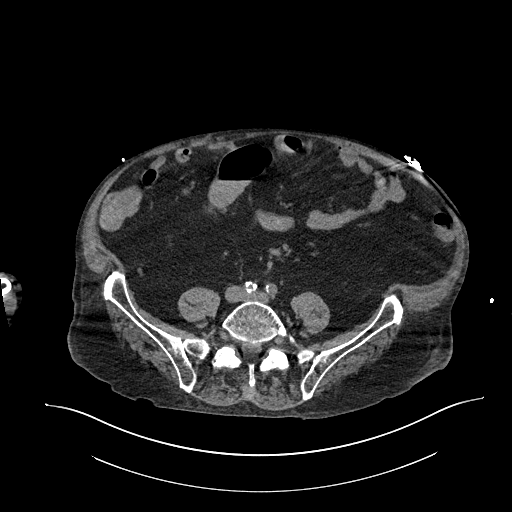
[im 128/340  soft-tissue]
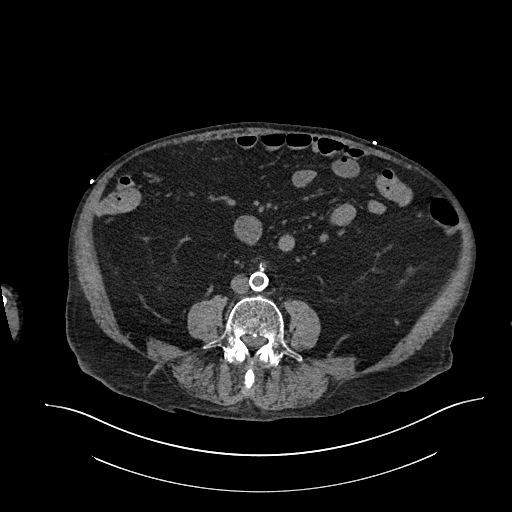
[im 149/340  soft-tissue]
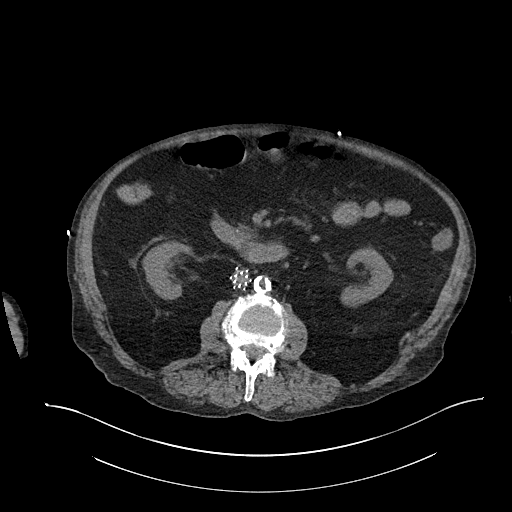
[im 191/340  soft-tissue]
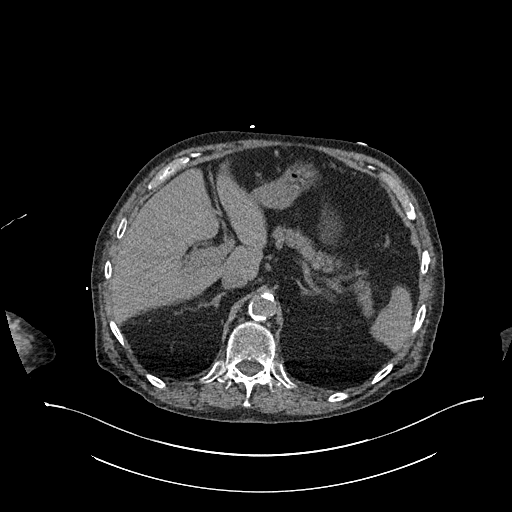
[im 212/340  soft-tissue]
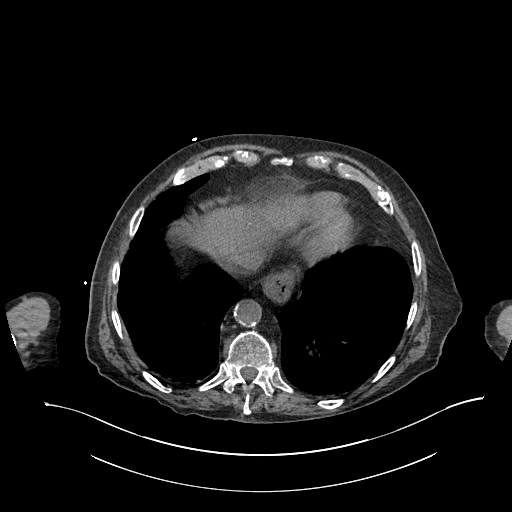
[im 234/340  soft-tissue]
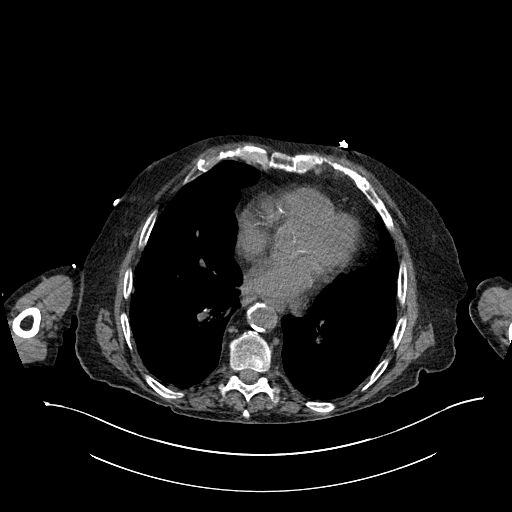
[im 234/340  bone]
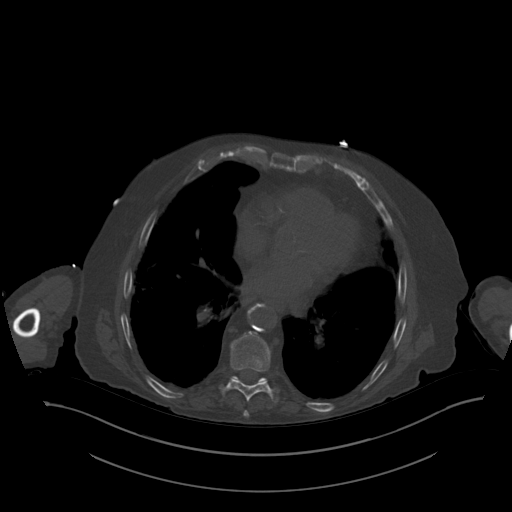
[im 255/340  soft-tissue]
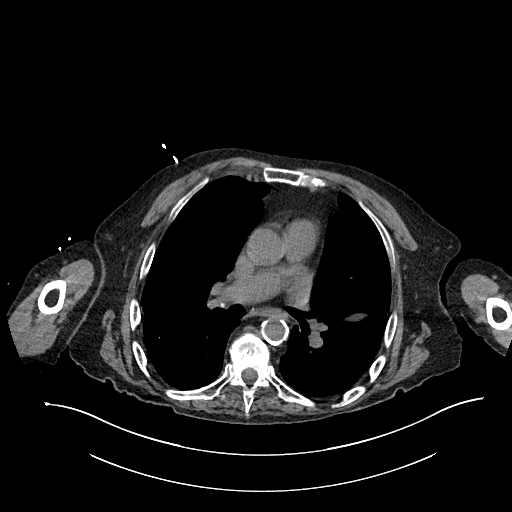
[im 297/340  soft-tissue]
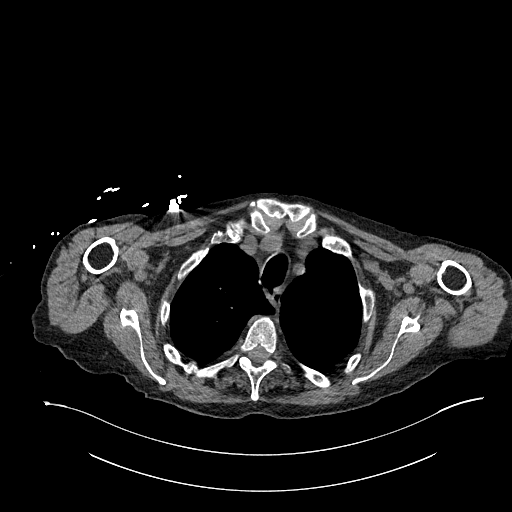
[im 318/340  soft-tissue]
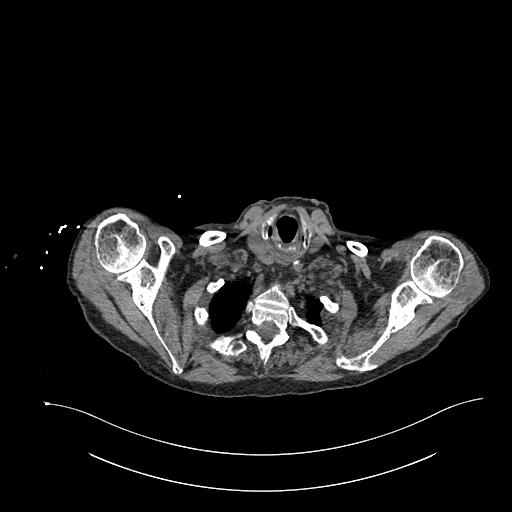

[Series 7: cap w/o 3.0 mm st cor · coronal · non-contrast · 0.88mm/px · 3 of 117 slices shown]
[im 39/117  soft-tissue]
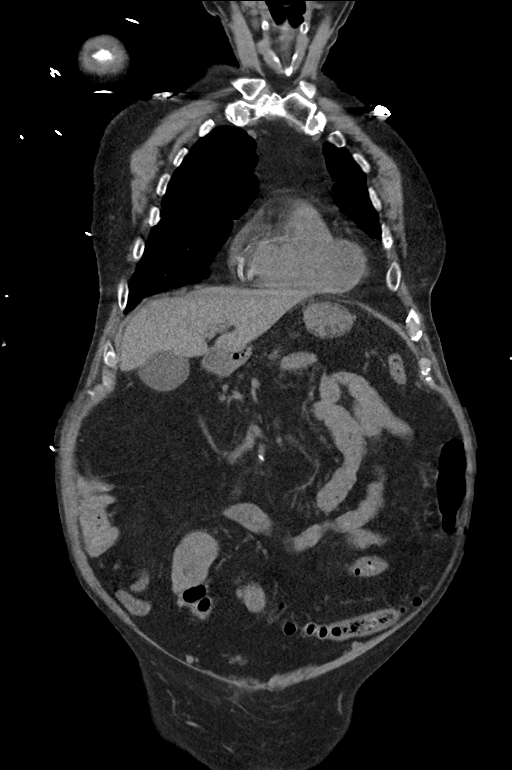
[im 52/117  soft-tissue]
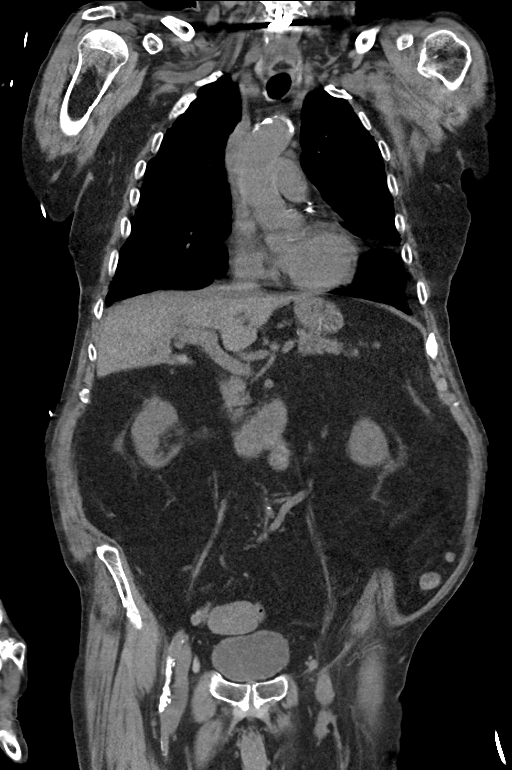
[im 65/117  soft-tissue]
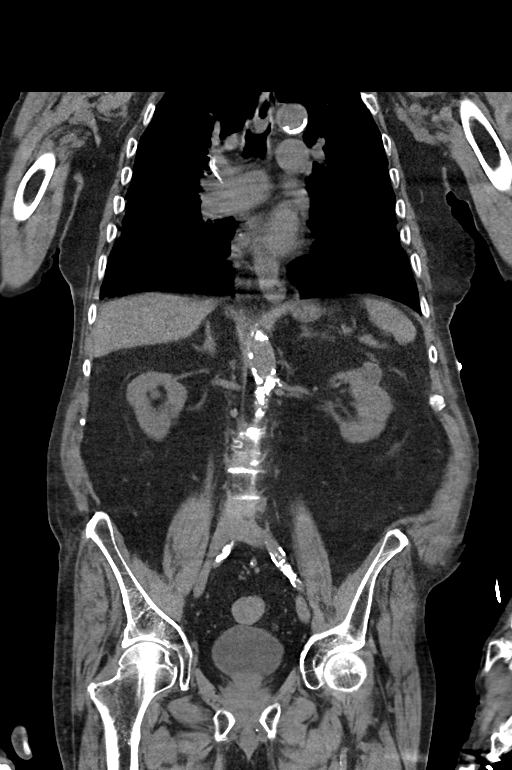

[15 of 46 positions shown; findings below may reference images not displayed]

FINDINGS: CT CHEST FINDINGS

Cardiovascular: Atherosclerotic calcifications aorta, proximal great
vessels and coronary arteries. Aorta normal caliber. Heart size
normal. No pericardial effusion.

Mediastinum/Nodes: Small hiatal hernia. No thoracic adenopathy.
Calcified subcarinal and RIGHT hilar lymph nodes. Base of cervical
region normal appearance.

Lungs/Pleura: Scattered subpleural interstitial changes. Minimal
fluid or thickening of LEFT major fissure. Calcified granuloma RIGHT
lower lobe. No acute infiltrate, mass, or pneumothorax.

Musculoskeletal: Diffuse osseous demineralization. Prior cervical
spine fusion.

CT ABDOMEN PELVIS FINDINGS

Hepatobiliary: Dependent calculi within gallbladder. Liver
unremarkable.

Pancreas: Atrophic pancreas without mass

Spleen: Normal appearance

Adrenals/Urinary Tract: Adrenal glands and RIGHT kidney normal
appearance. Exophytic cyst upper pole LEFT kidney 2.5 x 2.2 cm. No
urinary tract calcification or dilatation. Bladder unremarkable.

Stomach/Bowel: Diverticulosis of distal descending and sigmoid colon
without evidence of diverticulitis. Normal appendix. Stomach and
bowel loops otherwise normal appearance.

Vascular/Lymphatic: IVC filter. Atherosclerotic calcifications
aorta, iliac arteries, origins of celiac and superior mesenteric
arteries. Aorta normal caliber. No adenopathy.

Reproductive: Unremarkable prostate gland and seminal vesicles

Other: No free air or free fluid.  No hernia.

Musculoskeletal: Old appearing superior endplate compression
fracture of L3.
IMPRESSION: Old granulomatous disease.

Cholelithiasis.

Distal colonic diverticulosis without evidence of diverticulitis.

Small hiatal hernia.

No acute intrathoracic, intra-abdominal or intrapelvic
abnormalities.

Aortic Atherosclerosis ([id number]).

## 2023-03-20 DIAGNOSIS — M6281 Muscle weakness (generalized): Secondary | ICD-10-CM | POA: Diagnosis not present

## 2023-03-21 DIAGNOSIS — M6281 Muscle weakness (generalized): Secondary | ICD-10-CM | POA: Diagnosis not present

## 2023-03-22 DIAGNOSIS — M6281 Muscle weakness (generalized): Secondary | ICD-10-CM | POA: Diagnosis not present

## 2023-03-26 DIAGNOSIS — M6281 Muscle weakness (generalized): Secondary | ICD-10-CM | POA: Diagnosis not present

## 2023-03-27 DIAGNOSIS — M6281 Muscle weakness (generalized): Secondary | ICD-10-CM | POA: Diagnosis not present

## 2023-03-28 DIAGNOSIS — M6281 Muscle weakness (generalized): Secondary | ICD-10-CM | POA: Diagnosis not present

## 2023-03-29 DIAGNOSIS — M6281 Muscle weakness (generalized): Secondary | ICD-10-CM | POA: Diagnosis not present

## 2023-04-02 DIAGNOSIS — I4891 Unspecified atrial fibrillation: Secondary | ICD-10-CM | POA: Diagnosis not present

## 2023-04-02 DIAGNOSIS — N189 Chronic kidney disease, unspecified: Secondary | ICD-10-CM | POA: Diagnosis not present

## 2023-04-02 DIAGNOSIS — I10 Essential (primary) hypertension: Secondary | ICD-10-CM | POA: Diagnosis not present

## 2023-04-02 DIAGNOSIS — E785 Hyperlipidemia, unspecified: Secondary | ICD-10-CM | POA: Diagnosis not present

## 2023-04-02 DIAGNOSIS — J449 Chronic obstructive pulmonary disease, unspecified: Secondary | ICD-10-CM | POA: Diagnosis not present

## 2023-04-02 DIAGNOSIS — M6281 Muscle weakness (generalized): Secondary | ICD-10-CM | POA: Diagnosis not present

## 2023-04-03 DIAGNOSIS — I7091 Generalized atherosclerosis: Secondary | ICD-10-CM | POA: Diagnosis not present

## 2023-04-03 DIAGNOSIS — B351 Tinea unguium: Secondary | ICD-10-CM | POA: Diagnosis not present

## 2023-04-04 DIAGNOSIS — R600 Localized edema: Secondary | ICD-10-CM | POA: Diagnosis not present

## 2023-04-04 DIAGNOSIS — I10 Essential (primary) hypertension: Secondary | ICD-10-CM | POA: Diagnosis not present

## 2023-04-04 DIAGNOSIS — D649 Anemia, unspecified: Secondary | ICD-10-CM | POA: Diagnosis not present

## 2023-04-04 DIAGNOSIS — N189 Chronic kidney disease, unspecified: Secondary | ICD-10-CM | POA: Diagnosis not present

## 2023-04-04 DIAGNOSIS — J449 Chronic obstructive pulmonary disease, unspecified: Secondary | ICD-10-CM | POA: Diagnosis not present

## 2023-04-04 DIAGNOSIS — Z23 Encounter for immunization: Secondary | ICD-10-CM | POA: Diagnosis not present

## 2023-04-04 DIAGNOSIS — I998 Other disorder of circulatory system: Secondary | ICD-10-CM | POA: Diagnosis not present

## 2023-04-04 DIAGNOSIS — M6281 Muscle weakness (generalized): Secondary | ICD-10-CM | POA: Diagnosis not present

## 2023-04-04 DIAGNOSIS — K2100 Gastro-esophageal reflux disease with esophagitis, without bleeding: Secondary | ICD-10-CM | POA: Diagnosis not present

## 2023-04-04 DIAGNOSIS — E222 Syndrome of inappropriate secretion of antidiuretic hormone: Secondary | ICD-10-CM | POA: Diagnosis not present

## 2023-04-04 DIAGNOSIS — K5900 Constipation, unspecified: Secondary | ICD-10-CM | POA: Diagnosis not present

## 2023-04-04 DIAGNOSIS — G4700 Insomnia, unspecified: Secondary | ICD-10-CM | POA: Diagnosis not present

## 2023-04-04 DIAGNOSIS — R5381 Other malaise: Secondary | ICD-10-CM | POA: Diagnosis not present

## 2023-04-04 DIAGNOSIS — G4733 Obstructive sleep apnea (adult) (pediatric): Secondary | ICD-10-CM | POA: Diagnosis not present

## 2023-04-09 DIAGNOSIS — M6281 Muscle weakness (generalized): Secondary | ICD-10-CM | POA: Diagnosis not present

## 2023-04-11 DIAGNOSIS — M6281 Muscle weakness (generalized): Secondary | ICD-10-CM | POA: Diagnosis not present

## 2023-04-16 DIAGNOSIS — N189 Chronic kidney disease, unspecified: Secondary | ICD-10-CM | POA: Diagnosis not present

## 2023-04-16 DIAGNOSIS — I10 Essential (primary) hypertension: Secondary | ICD-10-CM | POA: Diagnosis not present

## 2023-04-16 DIAGNOSIS — I4891 Unspecified atrial fibrillation: Secondary | ICD-10-CM | POA: Diagnosis not present

## 2023-04-16 DIAGNOSIS — M6281 Muscle weakness (generalized): Secondary | ICD-10-CM | POA: Diagnosis not present

## 2023-04-16 DIAGNOSIS — J449 Chronic obstructive pulmonary disease, unspecified: Secondary | ICD-10-CM | POA: Diagnosis not present

## 2023-04-16 DIAGNOSIS — E785 Hyperlipidemia, unspecified: Secondary | ICD-10-CM | POA: Diagnosis not present

## 2023-04-18 DIAGNOSIS — E222 Syndrome of inappropriate secretion of antidiuretic hormone: Secondary | ICD-10-CM | POA: Diagnosis not present

## 2023-04-18 DIAGNOSIS — I998 Other disorder of circulatory system: Secondary | ICD-10-CM | POA: Diagnosis not present

## 2023-04-18 DIAGNOSIS — K5900 Constipation, unspecified: Secondary | ICD-10-CM | POA: Diagnosis not present

## 2023-04-18 DIAGNOSIS — D649 Anemia, unspecified: Secondary | ICD-10-CM | POA: Diagnosis not present

## 2023-04-18 DIAGNOSIS — G4733 Obstructive sleep apnea (adult) (pediatric): Secondary | ICD-10-CM | POA: Diagnosis not present

## 2023-04-18 DIAGNOSIS — G4700 Insomnia, unspecified: Secondary | ICD-10-CM | POA: Diagnosis not present

## 2023-04-18 DIAGNOSIS — J449 Chronic obstructive pulmonary disease, unspecified: Secondary | ICD-10-CM | POA: Diagnosis not present

## 2023-04-18 DIAGNOSIS — N189 Chronic kidney disease, unspecified: Secondary | ICD-10-CM | POA: Diagnosis not present

## 2023-04-18 DIAGNOSIS — R5381 Other malaise: Secondary | ICD-10-CM | POA: Diagnosis not present

## 2023-04-18 DIAGNOSIS — I10 Essential (primary) hypertension: Secondary | ICD-10-CM | POA: Diagnosis not present

## 2023-04-18 DIAGNOSIS — R600 Localized edema: Secondary | ICD-10-CM | POA: Diagnosis not present

## 2023-04-18 DIAGNOSIS — K2100 Gastro-esophageal reflux disease with esophagitis, without bleeding: Secondary | ICD-10-CM | POA: Diagnosis not present

## 2023-04-23 DIAGNOSIS — R5381 Other malaise: Secondary | ICD-10-CM | POA: Diagnosis not present

## 2023-04-23 DIAGNOSIS — J449 Chronic obstructive pulmonary disease, unspecified: Secondary | ICD-10-CM | POA: Diagnosis not present

## 2023-04-23 DIAGNOSIS — G4733 Obstructive sleep apnea (adult) (pediatric): Secondary | ICD-10-CM | POA: Diagnosis not present

## 2023-04-23 DIAGNOSIS — G4700 Insomnia, unspecified: Secondary | ICD-10-CM | POA: Diagnosis not present

## 2023-04-23 DIAGNOSIS — N189 Chronic kidney disease, unspecified: Secondary | ICD-10-CM | POA: Diagnosis not present

## 2023-04-23 DIAGNOSIS — I998 Other disorder of circulatory system: Secondary | ICD-10-CM | POA: Diagnosis not present

## 2023-04-23 DIAGNOSIS — R600 Localized edema: Secondary | ICD-10-CM | POA: Diagnosis not present

## 2023-04-23 DIAGNOSIS — E222 Syndrome of inappropriate secretion of antidiuretic hormone: Secondary | ICD-10-CM | POA: Diagnosis not present

## 2023-04-23 DIAGNOSIS — R21 Rash and other nonspecific skin eruption: Secondary | ICD-10-CM | POA: Diagnosis not present

## 2023-04-23 DIAGNOSIS — K5900 Constipation, unspecified: Secondary | ICD-10-CM | POA: Diagnosis not present

## 2023-04-23 DIAGNOSIS — K2100 Gastro-esophageal reflux disease with esophagitis, without bleeding: Secondary | ICD-10-CM | POA: Diagnosis not present

## 2023-04-23 DIAGNOSIS — I10 Essential (primary) hypertension: Secondary | ICD-10-CM | POA: Diagnosis not present

## 2023-04-29 DIAGNOSIS — I10 Essential (primary) hypertension: Secondary | ICD-10-CM | POA: Diagnosis not present

## 2023-05-07 DIAGNOSIS — J449 Chronic obstructive pulmonary disease, unspecified: Secondary | ICD-10-CM | POA: Diagnosis not present

## 2023-05-07 DIAGNOSIS — I998 Other disorder of circulatory system: Secondary | ICD-10-CM | POA: Diagnosis not present

## 2023-05-07 DIAGNOSIS — K2100 Gastro-esophageal reflux disease with esophagitis, without bleeding: Secondary | ICD-10-CM | POA: Diagnosis not present

## 2023-05-07 DIAGNOSIS — E785 Hyperlipidemia, unspecified: Secondary | ICD-10-CM | POA: Diagnosis not present

## 2023-05-07 DIAGNOSIS — M79651 Pain in right thigh: Secondary | ICD-10-CM | POA: Diagnosis not present

## 2023-05-07 DIAGNOSIS — I4891 Unspecified atrial fibrillation: Secondary | ICD-10-CM | POA: Diagnosis not present

## 2023-05-07 DIAGNOSIS — E222 Syndrome of inappropriate secretion of antidiuretic hormone: Secondary | ICD-10-CM | POA: Diagnosis not present

## 2023-05-07 DIAGNOSIS — M25561 Pain in right knee: Secondary | ICD-10-CM | POA: Diagnosis not present

## 2023-05-07 DIAGNOSIS — K5900 Constipation, unspecified: Secondary | ICD-10-CM | POA: Diagnosis not present

## 2023-05-07 DIAGNOSIS — G4733 Obstructive sleep apnea (adult) (pediatric): Secondary | ICD-10-CM | POA: Diagnosis not present

## 2023-05-07 DIAGNOSIS — M79604 Pain in right leg: Secondary | ICD-10-CM | POA: Diagnosis not present

## 2023-05-07 DIAGNOSIS — I10 Essential (primary) hypertension: Secondary | ICD-10-CM | POA: Diagnosis not present

## 2023-05-07 DIAGNOSIS — M25551 Pain in right hip: Secondary | ICD-10-CM | POA: Diagnosis not present

## 2023-05-07 DIAGNOSIS — R600 Localized edema: Secondary | ICD-10-CM | POA: Diagnosis not present

## 2023-05-08 DIAGNOSIS — N390 Urinary tract infection, site not specified: Secondary | ICD-10-CM | POA: Diagnosis not present

## 2023-05-08 DIAGNOSIS — I10 Essential (primary) hypertension: Secondary | ICD-10-CM | POA: Diagnosis not present

## 2023-05-08 DIAGNOSIS — R601 Generalized edema: Secondary | ICD-10-CM | POA: Diagnosis not present

## 2023-05-10 DIAGNOSIS — E785 Hyperlipidemia, unspecified: Secondary | ICD-10-CM | POA: Diagnosis not present

## 2023-05-10 DIAGNOSIS — K2100 Gastro-esophageal reflux disease with esophagitis, without bleeding: Secondary | ICD-10-CM | POA: Diagnosis not present

## 2023-05-10 DIAGNOSIS — J449 Chronic obstructive pulmonary disease, unspecified: Secondary | ICD-10-CM | POA: Diagnosis not present

## 2023-05-10 DIAGNOSIS — R600 Localized edema: Secondary | ICD-10-CM | POA: Diagnosis not present

## 2023-05-10 DIAGNOSIS — L853 Xerosis cutis: Secondary | ICD-10-CM | POA: Diagnosis not present

## 2023-05-10 DIAGNOSIS — K5900 Constipation, unspecified: Secondary | ICD-10-CM | POA: Diagnosis not present

## 2023-05-10 DIAGNOSIS — I4891 Unspecified atrial fibrillation: Secondary | ICD-10-CM | POA: Diagnosis not present

## 2023-05-10 DIAGNOSIS — I998 Other disorder of circulatory system: Secondary | ICD-10-CM | POA: Diagnosis not present

## 2023-05-10 DIAGNOSIS — G4700 Insomnia, unspecified: Secondary | ICD-10-CM | POA: Diagnosis not present

## 2023-05-10 DIAGNOSIS — R5381 Other malaise: Secondary | ICD-10-CM | POA: Diagnosis not present

## 2023-05-10 DIAGNOSIS — G629 Polyneuropathy, unspecified: Secondary | ICD-10-CM | POA: Diagnosis not present

## 2023-05-10 DIAGNOSIS — I10 Essential (primary) hypertension: Secondary | ICD-10-CM | POA: Diagnosis not present

## 2023-05-10 DIAGNOSIS — N189 Chronic kidney disease, unspecified: Secondary | ICD-10-CM | POA: Diagnosis not present

## 2023-05-15 ENCOUNTER — Inpatient Hospital Stay (HOSPITAL_COMMUNITY)
Admission: EM | Admit: 2023-05-15 | Discharge: 2023-05-18 | DRG: 645 | Disposition: A | Discharge status: Home / Self Care | Payer: No Typology Code available for payment source | Source: Skilled Nursing Facility | Attending: Internal Medicine | Admitting: Internal Medicine

## 2023-05-15 ENCOUNTER — Other Ambulatory Visit: Payer: Self-pay

## 2023-05-15 ENCOUNTER — Encounter (HOSPITAL_COMMUNITY): Payer: Self-pay | Admitting: Internal Medicine

## 2023-05-15 ENCOUNTER — Emergency Department (HOSPITAL_COMMUNITY): Payer: No Typology Code available for payment source

## 2023-05-15 DIAGNOSIS — Z7901 Long term (current) use of anticoagulants: Secondary | ICD-10-CM

## 2023-05-15 DIAGNOSIS — E871 Hypo-osmolality and hyponatremia: Principal | ICD-10-CM | POA: Diagnosis present

## 2023-05-15 DIAGNOSIS — E222 Syndrome of inappropriate secretion of antidiuretic hormone: Secondary | ICD-10-CM | POA: Diagnosis not present

## 2023-05-15 DIAGNOSIS — Z7982 Long term (current) use of aspirin: Secondary | ICD-10-CM

## 2023-05-15 DIAGNOSIS — G629 Polyneuropathy, unspecified: Secondary | ICD-10-CM | POA: Diagnosis present

## 2023-05-15 DIAGNOSIS — Z85828 Personal history of other malignant neoplasm of skin: Secondary | ICD-10-CM

## 2023-05-15 DIAGNOSIS — E869 Volume depletion, unspecified: Secondary | ICD-10-CM | POA: Diagnosis present

## 2023-05-15 DIAGNOSIS — I252 Old myocardial infarction: Secondary | ICD-10-CM

## 2023-05-15 DIAGNOSIS — I998 Other disorder of circulatory system: Secondary | ICD-10-CM | POA: Diagnosis not present

## 2023-05-15 DIAGNOSIS — I10 Essential (primary) hypertension: Secondary | ICD-10-CM | POA: Diagnosis present

## 2023-05-15 DIAGNOSIS — Z85820 Personal history of malignant melanoma of skin: Secondary | ICD-10-CM

## 2023-05-15 DIAGNOSIS — Z79899 Other long term (current) drug therapy: Secondary | ICD-10-CM

## 2023-05-15 DIAGNOSIS — Z86718 Personal history of other venous thrombosis and embolism: Secondary | ICD-10-CM

## 2023-05-15 DIAGNOSIS — Z66 Do not resuscitate: Secondary | ICD-10-CM | POA: Diagnosis present

## 2023-05-15 DIAGNOSIS — Z955 Presence of coronary angioplasty implant and graft: Secondary | ICD-10-CM

## 2023-05-15 DIAGNOSIS — H9193 Unspecified hearing loss, bilateral: Secondary | ICD-10-CM | POA: Diagnosis present

## 2023-05-15 DIAGNOSIS — Z86711 Personal history of pulmonary embolism: Secondary | ICD-10-CM

## 2023-05-15 DIAGNOSIS — J449 Chronic obstructive pulmonary disease, unspecified: Secondary | ICD-10-CM | POA: Diagnosis present

## 2023-05-15 DIAGNOSIS — K219 Gastro-esophageal reflux disease without esophagitis: Secondary | ICD-10-CM | POA: Diagnosis present

## 2023-05-15 DIAGNOSIS — E785 Hyperlipidemia, unspecified: Secondary | ICD-10-CM | POA: Diagnosis present

## 2023-05-15 DIAGNOSIS — G4733 Obstructive sleep apnea (adult) (pediatric): Secondary | ICD-10-CM | POA: Diagnosis present

## 2023-05-15 DIAGNOSIS — R140 Abdominal distension (gaseous): Secondary | ICD-10-CM | POA: Diagnosis present

## 2023-05-15 DIAGNOSIS — K5900 Constipation, unspecified: Secondary | ICD-10-CM | POA: Diagnosis present

## 2023-05-15 DIAGNOSIS — Z87891 Personal history of nicotine dependence: Secondary | ICD-10-CM

## 2023-05-15 DIAGNOSIS — Z888 Allergy status to other drugs, medicaments and biological substances status: Secondary | ICD-10-CM

## 2023-05-15 LAB — CBC WITH DIFFERENTIAL/PLATELET
Abs Immature Granulocytes: 0.02 10*3/uL (ref 0.00–0.07)
Basophils Absolute: 0.1 10*3/uL (ref 0.0–0.1)
Basophils Relative: 1 %
Eosinophils Absolute: 0.3 10*3/uL (ref 0.0–0.5)
Eosinophils Relative: 5 %
HCT: 35.9 % — ABNORMAL LOW (ref 39.0–52.0)
Hemoglobin: 12.3 g/dL — ABNORMAL LOW (ref 13.0–17.0)
Immature Granulocytes: 0 %
Lymphocytes Relative: 33 %
Lymphs Abs: 2.4 10*3/uL (ref 0.7–4.0)
MCH: 30.2 pg (ref 26.0–34.0)
MCHC: 34.3 g/dL (ref 30.0–36.0)
MCV: 88.2 fL (ref 80.0–100.0)
Monocytes Absolute: 0.9 10*3/uL (ref 0.1–1.0)
Monocytes Relative: 13 %
Neutro Abs: 3.7 10*3/uL (ref 1.7–7.7)
Neutrophils Relative %: 48 %
Platelets: 257 10*3/uL (ref 150–400)
RBC: 4.07 MIL/uL — ABNORMAL LOW (ref 4.22–5.81)
RDW: 12.9 % (ref 11.5–15.5)
WBC: 7.4 10*3/uL (ref 4.0–10.5)
nRBC: 0 % (ref 0.0–0.2)

## 2023-05-15 LAB — BASIC METABOLIC PANEL
Anion gap: 9 (ref 5–15)
BUN: 18 mg/dL (ref 8–23)
CO2: 18 mmol/L — ABNORMAL LOW (ref 22–32)
Calcium: 8.1 mg/dL — ABNORMAL LOW (ref 8.9–10.3)
Chloride: 95 mmol/L — ABNORMAL LOW (ref 98–111)
Creatinine, Ser: 0.97 mg/dL (ref 0.61–1.24)
GFR, Estimated: >60 mL/min (ref >60)
Glucose, Bld: 95 mg/dL (ref 70–99)
Potassium: 4.5 mmol/L (ref 3.5–5.1)
Sodium: 122 mmol/L — ABNORMAL LOW (ref 135–145)

## 2023-05-15 LAB — OSMOLALITY, URINE: Osmolality, Ur: 316 mOsm/kg (ref 300–900)

## 2023-05-15 LAB — I-STAT CHEM 8, ED
BUN: 19 mg/dL (ref 8–23)
Calcium, Ion: 1.1 mmol/L — ABNORMAL LOW (ref 1.15–1.40)
Chloride: 96 mmol/L — ABNORMAL LOW (ref 98–111)
Creatinine, Ser: 0.9 mg/dL (ref 0.61–1.24)
Glucose, Bld: 96 mg/dL (ref 70–99)
HCT: 38 % — ABNORMAL LOW (ref 39.0–52.0)
Hemoglobin: 12.9 g/dL — ABNORMAL LOW (ref 13.0–17.0)
Potassium: 4.5 mmol/L (ref 3.5–5.1)
Sodium: 123 mmol/L — ABNORMAL LOW (ref 135–145)
TCO2: 19 mmol/L — ABNORMAL LOW (ref 22–32)

## 2023-05-15 LAB — SODIUM, URINE, RANDOM: Sodium, Ur: 70 mmol/L

## 2023-05-15 MED ORDER — ACETAMINOPHEN 650 MG RE SUPP: 650.0000 mg | Freq: Four times a day (QID) | RECTAL | Status: DC | PRN

## 2023-05-15 MED ORDER — SODIUM CHLORIDE 1 G PO TABS
1.0000 g | ORAL_TABLET | Freq: Three times a day (TID) | ORAL | Status: DC
  Administered 2023-05-16 – 2023-05-18 (×9): 1 g via ORAL
  Filled 2023-05-15 (×10): qty 1

## 2023-05-15 MED ORDER — SODIUM CHLORIDE 0.9 % IV SOLN: Freq: Once | INTRAVENOUS | Status: AC

## 2023-05-15 MED ORDER — ACETAMINOPHEN 325 MG PO TABS
650.0000 mg | ORAL_TABLET | Freq: Four times a day (QID) | ORAL | Status: DC | PRN
  Administered 2023-05-15 – 2023-05-18 (×2): 650 mg via ORAL
  Filled 2023-05-15 (×2): qty 2

## 2023-05-15 MED ORDER — APIXABAN 5 MG PO TABS
5.0000 mg | ORAL_TABLET | Freq: Two times a day (BID) | ORAL | Status: DC
  Administered 2023-05-15 – 2023-05-18 (×6): 5 mg via ORAL
  Filled 2023-05-15 (×6): qty 1

## 2023-05-15 MED ORDER — ORAL CARE MOUTH RINSE: 15.0000 mL | OROMUCOSAL | Status: DC | PRN

## 2023-05-15 MED ORDER — ONDANSETRON HCL 4 MG/2ML IJ SOLN: 4.0000 mg | Freq: Four times a day (QID) | INTRAMUSCULAR | Status: DC | PRN

## 2023-05-15 MED ORDER — ONDANSETRON HCL 4 MG PO TABS: 4.0000 mg | ORAL_TABLET | Freq: Four times a day (QID) | ORAL | Status: DC | PRN

## 2023-05-15 NOTE — Assessment & Plan Note (Signed)
Cont eliquis 

## 2023-05-15 NOTE — ED Triage Notes (Signed)
Pt BIB [facility] from [facility]. Pt is here because of critical sodium level 119. EMS also reported he is being treated for DVT in right leg and he is on Eliquis

## 2023-05-15 NOTE — Progress Notes (Signed)
New Admission Note:  Arrival Method:Stretcher Mental Orientation: Alert and oriented x 4 Telemetry: N/A Assessment: Completed Skin: Warm and dry IV: NSL Pain: 7/10 Rt leg  Tubes: Safety Measures: Safety Fall Prevention Plan initiated.  Admission: Completed 5 M  Orientation: Patient has been orientated to the room, unit and the staff. Welcome booklet given.  Family: N/A  Orders have been reviewed and implemented. Will continue to monitor the patient. Call light has been placed within reach and bed alarm has been activated.   Guilford Shi BSN, RN  Phone Number: [phone]

## 2023-05-15 NOTE — Assessment & Plan Note (Addendum)
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 Med rec pending, but I dont see any thiazides or SSRIs on med list.  Check CXR as well to ensure no evidence of SCLC, though doubt this would have caused SIADH for 18+ months without being detected.

## 2023-05-15 NOTE — Plan of Care (Signed)
  Problem: Education: Goal: Knowledge of General Education information will improve Description Including pain rating scale, medication(s)/side effects and non-pharmacologic comfort measures Outcome: Progressing   

## 2023-05-15 NOTE — Assessment & Plan Note (Addendum)
Acute on chronic hyponatremia. 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 Med rec pending, but I dont see any thiazides or SSRIs on med list. Check serum osm, urine sodium, and urine osm to confirm SIADH Stop isotonic IVF Fluid restrict to / day 1g salt tabs PO TID Check BMP Q6H Nephro consult in AM if not rapidly improving.

## 2023-05-15 NOTE — H&P (Signed)
History and Physical    [name]:[id number] DOB: Feb 27, 1933 DOA: 05/15/2023 DOS: the patient was seen and examined on 05/15/2023 PCP: Corrington, Kip A, MD  Patient coming from: {Point_of_Origin:[address]777}  Chief Complaint: No chief complaint on file.  HPI: [name] is a 89 y.o. male with medical history significant of ***  Review of Systems: {ROS_Text:26778} Past Medical History:  Diagnosis Date   Bilateral hearing loss    COPD (chronic obstructive pulmonary disease) (HCC)    DVT (deep venous thrombosis) (HCC)    GERD (gastroesophageal reflux disease)    History of pulmonary embolus (PE)    HLD (hyperlipidemia)    HTN (hypertension)    Melanoma (HCC)    NSTEMI (non-ST elevated myocardial infarction) (HCC)    OSA (obstructive sleep apnea)    Peripheral neuropathy    Squamous cell skin cancer    Past Surgical History:  Procedure Laterality Date   BIOPSY  11/07/2021   Procedure: BIOPSY;  Surgeon: Sherrilyn Rist, MD;  Location: [facility];  Service: Gastroenterology;;   ESOPHAGOGASTRODUODENOSCOPY (EGD) WITH PROPOFOL N/A 11/07/2021   Procedure: ESOPHAGOGASTRODUODENOSCOPY (EGD) WITH PROPOFOL;  Surgeon: Sherrilyn Rist, MD;  Location: [facility];  Service: Gastroenterology;  Laterality: N/A;   PERCUTANEOUS CORONARY STENT INTERVENTION (PCI-S)     RCA   Social History:  reports that he quit smoking about 44 years ago. His smoking use included cigarettes. He has never used smokeless tobacco. He reports that he does not currently use alcohol. He reports that he does not use drugs.  Allergies  Allergen Reactions   Brilinta [Ticagrelor] Other (See Comments)    GI Hemorrhage   Coffea Arabica     DECAF coffee causes vision changes    Tetanus Toxoids Other (See Comments)    "Horse serum only" Unknown reaction Documented on MAR    Zocor [Simvastatin] Other (See Comments)    Myalgias    Spiriva Respimat [Tiotropium Bromide Monohydrate] Other (See  Comments)    Urinary retention    Niacin Other (See Comments)    Flushing      Family History  Problem Relation Age of Onset   Cancer Mother     Prior to Admission medications   Medication Sig Start Date End Date Taking? Authorizing Provider  acetaminophen (TYLENOL) 500 MG tablet Take 1,000 mg by mouth every 8 (eight) hours as needed for fever.    [provider]  Aclidinium Bromide (TUDORZA PRESSAIR) 400 MCG/ACT AEPB Inhale 1 puff into the lungs in the morning and at bedtime. 01/12/23   Levin Erp, MD  albuterol (VENTOLIN HFA) 108 (90 Base) MCG/ACT inhaler Inhale 2 puffs into the lungs every 6 (six) hours as needed for wheezing or shortness of breath.    [provider]  apixaban (ELIQUIS) 5 MG TABS tablet Take 1 tablet (5 mg total) by mouth 2 (two) times daily. 01/22/23   Levin Erp, MD  ascorbic acid (VITAMIN C) 500 MG tablet Take 500 mg by mouth daily.    [provider]  aspirin EC 81 MG tablet Take 81 mg by mouth daily.    [provider]  atorvastatin (LIPITOR) 40 MG tablet Take 0.5 tablets (20 mg total) by mouth daily. 01/22/23   Levin Erp, MD  Brinzolamide-Brimonidine [facility]) 1-0.2 % SUSP Place 1 drop into both eyes 2 (two) times daily.    [provider]  Calcium Carb-Cholecalciferol (CALCIUM 500 +D) 500-10 MG-MCG TABS Take 1 tablet by mouth daily.    [provider]  carboxymethylcellulose (REFRESH TEARS) 0.5 % SOLN Place 1 drop into both eyes 4 (four) times daily.    [provider]  cholecalciferol 25 MCG (1000 UT) tablet Take 1,000 Units by mouth daily.    [provider]  docusate sodium (COLACE) 100 MG capsule Take 100 mg by mouth 2 (two) times daily.    [provider]  ferrous sulfate 325 (65 FE) MG EC tablet Take 325 mg by mouth every Monday, Wednesday, and Friday.    [provider]  fluticasone-salmeterol (WIXELA INHUB) 250-50 MCG/ACT AEPB Inhale 1 puff into the  lungs in the morning and at bedtime.    [provider]  furosemide (LASIX) 20 MG tablet Take 20 mg by mouth See admin instructions. 2 entries on MAR: 20 mg once a morning for 3 days (01/16/23-01/18/23) + 20 mg once daily as needed for signs of volume overload (gain of 3 lbs over night or 5 lbs in 1 week, increased shortness of breath and/or increased leg swelling)    [provider]  gabapentin (NEURONTIN) 300 MG capsule Take 1 capsule (300 mg total) by mouth 2 (two) times daily. 01/12/23   Levin Erp, MD  guaiFENesin (MUCINEX) 600 MG 12 hr tablet Take 600 mg by mouth every 12 (twelve) hours.    [provider]  ipratropium-albuterol (DUONEB) 0.5-2.5 (3) MG/3ML SOLN Take 3 mLs by nebulization every 6 (six) hours.    [provider]  latanoprost (XALATAN) 0.005 % ophthalmic solution Place 1 drop into both eyes at bedtime.    [provider]  lisinopril (ZESTRIL) 10 MG tablet Take 5 mg by mouth See admin instructions. 5 mg once daily. HOLD for SBP < 100.    [provider]  Lutein 10 MG TABS Take 10 mg by mouth daily.    [provider]  Multiple Vitamin (MULTIVITAMIN) tablet Take 1 tablet by mouth daily. Multivitamin + Folic acid 400 mcg.    [provider]  Neomycin-Bacitracin-Polymyxin (TRIPLE ANTIBIOTIC) OINT Apply 1 Application topically 2 (two) times daily as needed (minor wounds/cuts).    [provider]  nystatin powder Apply 1 Application topically 2 (two) times daily as needed (irritation). To groin area    [provider]  pantoprazole (PROTONIX) 40 MG tablet Take 1 tablet (40 mg total) by mouth 2 (two) times daily before a meal. Patient taking differently: Take 40 mg by mouth 2 (two) times daily. 11/08/21   Merrilyn Puma, MD  Probiotic Product (RISA-BID PROBIOTIC) TABS Take 1 tablet by mouth daily.    [provider]  senna (SENOKOT) 8.6 MG TABS tablet Take 17.2 mg by mouth daily as needed  (constipation).    [provider]  Skin Protectants, Misc. (MINERIN CREME) CREA Apply 1 Application topically daily. To legs and back.    [provider]  sodium chloride 1 g tablet Take 1 tablet (1 g total) by mouth 3 (three) times daily with meals. 11/08/21   Merrilyn Puma, MD  tamsulosin (FLOMAX) 0.4 MG CAPS capsule Take 2 capsules (0.8 mg total) by mouth at bedtime. 01/22/23   Levin Erp, MD  traZODone (DESYREL) 50 MG tablet Take 0.5 tablets (25 mg total) by mouth at bedtime. 01/12/23   Levin Erp, MD  zinc oxide 20 % ointment Apply 1 Application topically as needed for irritation. To buttocks.    [provider]    Physical Exam: Vitals:   05/15/23 2130 05/15/23 2145 05/15/23 2200 05/15/23 2215  BP: Marland Kitchen)  147/61 (!) 151/57 (!) 160/52 (!) 160/62  Pulse: 74 80 75 73  Resp: 18 19 18 17   Temp:      TempSrc:      SpO2: 98% 95% 99% 98%  Weight:      Height:       *** Data Reviewed: {Tip this will not be part of the note when signed- Document your independent interpretation of telemetry tracing, EKG, lab, Radiology test or any other diagnostic tests. Add any new diagnostic test ordered today. (Optional):26781} {Results:26384}  Assessment and Plan: * Hyponatremia Acute on chronic hyponatremia. 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 Med rec pending, but I dont see any thiazides or SSRIs on med list. Check serum osm, urine sodium, and urine osm to confirm SIADH Stop isotonic IVF Fluid restrict to / day 1g salt tabs PO TID Check BMP Q6H Nephro consult in AM if not rapidly improving.  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 Med rec pending, but I dont see any thiazides or SSRIs on med list.  Check CXR as well to ensure no evidence of SCLC, though doubt this would have caused SIADH for 18+ months without  being detected.  Lower limb ischemia Cont eliquis      Advance Care Planning:   Code Status: DNR ***  Consults: ***  Family Communication: ***  Severity of Illness: {Observation/Inpatient:21159}  Author: Hillary Bow., DO 05/15/2023 10:37 PM  For on call review [email or web address].

## 2023-05-15 NOTE — ED Provider Notes (Signed)
[place] EMERGENCY DEPARTMENT AT [facility] Provider Note   CSN: [id number] Arrival date & time: 05/15/23  1739     History  No chief complaint on file.   Marcus Martin is a 89 y.o. male.  Patient is a 89 year old male who presents with reported low sodium level of 119.  He currently resides at [facility].  They had drawn some labs which showed a low sodium level.  Patient does not report any abnormal symptoms.  No increased weakness.  No dizziness.  No fatigue.  No vomiting or diarrhea.  On chart review, patient was admitted in February for critical left leg ischemia.  Initially it was felt that the leg was going to need to be amputated.  However then it did start to revascularize and it was felt that patient can be maintained on anticoagulants.  He was started on Eliquis.  He was not a good candidate for bypass.  He denies any abnormal leg pain.       Home Medications Prior to Admission medications   Medication Sig Start Date End Date Taking? Authorizing Provider  acetaminophen (TYLENOL) 500 MG tablet Take 1,000 mg by mouth every 8 (eight) hours as needed for fever.    [provider]  Aclidinium Bromide (TUDORZA PRESSAIR) 400 MCG/ACT AEPB Inhale 1 puff into the lungs in the morning and at bedtime. 01/12/23   Levin Erp, MD  albuterol (VENTOLIN HFA) 108 (90 Base) MCG/ACT inhaler Inhale 2 puffs into the lungs every 6 (six) hours as needed for wheezing or shortness of breath.    [provider]  apixaban (ELIQUIS) 5 MG TABS tablet Take 1 tablet (5 mg total) by mouth 2 (two) times daily. 01/22/23   Levin Erp, MD  ascorbic acid (VITAMIN C) 500 MG tablet Take 500 mg by mouth daily.    [provider]  aspirin EC 81 MG tablet Take 81 mg by mouth daily.    [provider]  atorvastatin (LIPITOR) 40 MG tablet Take 0.5 tablets (20 mg total) by mouth daily. 01/22/23   Levin Erp, MD  Brinzolamide-Brimonidine  [facility]) 1-0.2 % SUSP Place 1 drop into both eyes 2 (two) times daily.    [provider]  Calcium Carb-Cholecalciferol (CALCIUM 500 +D) 500-10 MG-MCG TABS Take 1 tablet by mouth daily.    [provider]  carboxymethylcellulose (REFRESH TEARS) 0.5 % SOLN Place 1 drop into both eyes 4 (four) times daily.    [provider]  cholecalciferol 25 MCG (1000 UT) tablet Take 1,000 Units by mouth daily.    [provider]  docusate sodium (COLACE) 100 MG capsule Take 100 mg by mouth 2 (two) times daily.    [provider]  ferrous sulfate 325 (65 FE) MG EC tablet Take 325 mg by mouth every Monday, Wednesday, and Friday.    [provider]  fluticasone-salmeterol (WIXELA INHUB) 250-50 MCG/ACT AEPB Inhale 1 puff into the lungs in the morning and at bedtime.    [provider]  furosemide (LASIX) 20 MG tablet Take 20 mg by mouth See admin instructions. 2 entries on MAR: 20 mg once a morning for 3 days (01/16/23-01/18/23) + 20 mg once daily as needed for signs of volume overload (gain of 3 lbs over night or 5 lbs in 1 week, increased shortness of breath and/or increased leg swelling)    [provider]  gabapentin (NEURONTIN) 300 MG capsule Take 1 capsule (300 mg total) by mouth 2 (two) times daily.  01/12/23   Levin Erp, MD  guaiFENesin (MUCINEX) 600 MG 12 hr tablet Take 600 mg by mouth every 12 (twelve) hours.    [provider]  ipratropium-albuterol (DUONEB) 0.5-2.5 (3) MG/3ML SOLN Take 3 mLs by nebulization every 6 (six) hours.    [provider]  latanoprost (XALATAN) 0.005 % ophthalmic solution Place 1 drop into both eyes at bedtime.    [provider]  lisinopril (ZESTRIL) 10 MG tablet Take 5 mg by mouth See admin instructions. 5 mg once daily. HOLD for SBP < 100.    [provider]  Lutein 10 MG TABS Take 10 mg by mouth daily.    [provider]  Multiple Vitamin (MULTIVITAMIN)  tablet Take 1 tablet by mouth daily. Multivitamin + Folic acid 400 mcg.    [provider]  Neomycin-Bacitracin-Polymyxin (TRIPLE ANTIBIOTIC) OINT Apply 1 Application topically 2 (two) times daily as needed (minor wounds/cuts).    [provider]  nystatin powder Apply 1 Application topically 2 (two) times daily as needed (irritation). To groin area    [provider]  pantoprazole (PROTONIX) 40 MG tablet Take 1 tablet (40 mg total) by mouth 2 (two) times daily before a meal. Patient taking differently: Take 40 mg by mouth 2 (two) times daily. 11/08/21   Merrilyn Puma, MD  Probiotic Product (RISA-BID PROBIOTIC) TABS Take 1 tablet by mouth daily.    [provider]  senna (SENOKOT) 8.6 MG TABS tablet Take 17.2 mg by mouth daily as needed (constipation).    [provider]  Skin Protectants, Misc. (MINERIN CREME) CREA Apply 1 Application topically daily. To legs and back.    [provider]  sodium chloride 1 g tablet Take 1 tablet (1 g total) by mouth 3 (three) times daily with meals. 11/08/21   Merrilyn Puma, MD  tamsulosin (FLOMAX) 0.4 MG CAPS capsule Take 2 capsules (0.8 mg total) by mouth at bedtime. 01/22/23   Levin Erp, MD  traZODone (DESYREL) 50 MG tablet Take 0.5 tablets (25 mg total) by mouth at bedtime. 01/12/23   Levin Erp, MD  zinc oxide 20 % ointment Apply 1 Application topically as needed for irritation. To buttocks.    [provider]      Allergies    Brilinta [ticagrelor], Coffea arabica, Tetanus toxoids, Zocor [simvastatin], Spiriva respimat [tiotropium bromide monohydrate], and Niacin    Review of Systems   Review of Systems  Constitutional:  Negative for chills, diaphoresis, fatigue and fever.  HENT:  Negative for congestion, rhinorrhea and sneezing.   Eyes: Negative.   Respiratory:  Negative for cough, chest tightness and shortness of breath.   Cardiovascular:  Negative for chest pain and leg  swelling.  Gastrointestinal:  Negative for abdominal pain, blood in stool, diarrhea, nausea and vomiting.  Genitourinary:  Negative for difficulty urinating, flank pain, frequency and hematuria.  Musculoskeletal:  Positive for myalgias (Chronic leg pain but unchanged from baseline). Negative for arthralgias and back pain.  Skin:  Negative for rash.  Neurological:  Negative for dizziness, speech difficulty, weakness, numbness and headaches.    Physical Exam Updated Vital Signs BP (!) 158/71 (BP Location: Right Arm)   Pulse 76   Temp 98.2 F (36.8 C) (Oral)   Resp 15   Ht 6\' 11"  (2.108 m)   Wt 68 kg   SpO2 98%   BMI 15.31 kg/m  Physical Exam Constitutional:      Appearance: He is well-developed.  HENT:     Head:  Normocephalic and atraumatic.  Eyes:     Pupils: Pupils are equal, round, and reactive to light.  Cardiovascular:     Rate and Rhythm: Normal rate and regular rhythm.     Heart sounds: Normal heart sounds.  Pulmonary:     Effort: Pulmonary effort is normal. No respiratory distress.     Breath sounds: Normal breath sounds. No wheezing or rales.  Chest:     Chest wall: No tenderness.  Abdominal:     General: Bowel sounds are normal.     Palpations: Abdomen is soft.     Tenderness: There is no abdominal tenderness. There is no guarding or rebound.  Musculoskeletal:        General: Normal range of motion.     Cervical back: Normal range of motion and neck supple.     Comments: Pedal pulses are not palpated but no discoloration or color change.  Lymphadenopathy:     Cervical: No cervical adenopathy.  Skin:    General: Skin is warm and dry.     Findings: No rash.  Neurological:     Mental Status: He is alert and oriented to person, place, and time.     ED Results / Procedures / Treatments   Labs (all labs ordered are listed, but only abnormal results are displayed) Labs Reviewed  BASIC METABOLIC PANEL - Abnormal; Notable for the following components:       Result Value   Sodium 122 (*)    Chloride 95 (*)    CO2 18 (*)    Calcium 8.1 (*)    All other components within normal limits  CBC WITH DIFFERENTIAL/PLATELET - Abnormal; Notable for the following components:   RBC 4.07 (*)    Hemoglobin 12.3 (*)    HCT 35.9 (*)    All other components within normal limits  I-STAT CHEM 8, ED - Abnormal; Notable for the following components:   Sodium 123 (*)    Chloride 96 (*)    Calcium, Ion 1.10 (*)    TCO2 19 (*)    Hemoglobin 12.9 (*)    HCT 38.0 (*)    All other components within normal limits    EKG EKG Interpretation  Date/Time:  Tuesday May 15 2023 18:21:30 EDT Ventricular Rate:  76 PR Interval:  271 QRS Duration: 94 QT Interval:  376 QTC Calculation: 423 R Axis:   78 Text Interpretation: Sinus rhythm Prolonged PR interval since last tracing no significant change Confirmed by Rolan Bucco [phone]) on 05/15/2023 7:19:13 PM  Radiology No results found.  Procedures Procedures    Medications Ordered in ED Medications  0.9 %  sodium chloride infusion ( Intravenous New Bag/Given 05/15/23 2029)    ED Course/ Medical Decision Making/ A&P                             Medical Decision Making Amount and/or Complexity of Data Reviewed Labs: ordered.  Risk Prescription drug management. Decision regarding hospitalization.   Patient is a 89 year old male who presents with hyponatremia.  He otherwise is asymptomatic.  His labs show a sodium level 122.  He has some other minor lab abnormalities but nothing overly concerning.  Per chart review he has a history of SIADH.  He was started on low-dose saline infusion.  I consulted with Dr. Julian Reil who will admit the patient for further treatment.  Final Clinical Impression(s) / ED Diagnoses Final diagnoses:  Hyponatremia  Rx / DC Orders ED Discharge Orders     None         Rolan Bucco, MD 05/15/23 2129

## 2023-05-15 NOTE — ED Notes (Signed)
ED TO INPATIENT HANDOFF REPORT  ED Nurse Name and Phone #: Amil Amen [phone]  S Name/Age/Gender Marcus Martin 89 y.o. male Room/Bed: 001C/001C  Code Status   Code Status: DNR  Home/SNF/Other Home Patient oriented to: self, place, time, and situation Is this baseline? Yes   Triage Complete: Triage complete  Chief Complaint Hyponatremia [E87.1]  Triage Note Pt BIB [facility] from [facility]. Pt is here because of critical sodium level 119. EMS also reported he is being treated for DVT in right leg and he is on Eliquis   Allergies Allergies  Allergen Reactions   Brilinta [Ticagrelor] Other (See Comments)    GI Hemorrhage   Coffea Arabica     DECAF coffee causes vision changes    Tetanus Toxoids Other (See Comments)    "Horse serum only" Unknown reaction Documented on MAR    Zocor [Simvastatin] Other (See Comments)    Myalgias    Spiriva Respimat [Tiotropium Bromide Monohydrate] Other (See Comments)    Urinary retention    Niacin Other (See Comments)    Flushing      Level of Care/Admitting Diagnosis ED Disposition     ED Disposition  Admit   Condition  --   Comment  Hospital Area: [facility] [[id number]]  Level of Care: Med-Surg [16]  May place patient in observation at [facility] or [facility] if equivalent level of care is available:: No  Covid Evaluation: Asymptomatic - no recent exposure (last 10 days) testing not required  Diagnosis: Hyponatremia [454098]  Admitting Physician: Hillary Bow [[id number]]  Attending Physician: Hillary Bow [[id number]]          B Medical/Surgery History Past Medical History:  Diagnosis Date   Bilateral hearing loss    COPD (chronic obstructive pulmonary disease) (HCC)    DVT (deep venous thrombosis) (HCC)    GERD (gastroesophageal reflux disease)    History of pulmonary embolus (PE)    HLD (hyperlipidemia)    HTN (hypertension)    Melanoma (HCC)    NSTEMI (non-ST elevated myocardial  infarction) (HCC)    OSA (obstructive sleep apnea)    Peripheral neuropathy    Squamous cell skin cancer    Past Surgical History:  Procedure Laterality Date   BIOPSY  11/07/2021   Procedure: BIOPSY;  Surgeon: Sherrilyn Rist, MD;  Location: [facility] ENDOSCOPY;  Service: Gastroenterology;;   ESOPHAGOGASTRODUODENOSCOPY (EGD) WITH PROPOFOL N/A 11/07/2021   Procedure: ESOPHAGOGASTRODUODENOSCOPY (EGD) WITH PROPOFOL;  Surgeon: Sherrilyn Rist, MD;  Location: [facility] ENDOSCOPY;  Service: Gastroenterology;  Laterality: N/A;   PERCUTANEOUS CORONARY STENT INTERVENTION (PCI-S)     RCA     A IV Location/Drains/Wounds Patient Lines/Drains/Airways Status     Active Line/Drains/Airways     Name Placement date Placement time Site Days   Peripheral IV 05/15/23 Posterior;Right Forearm 05/15/23  2014  Forearm  less than 1            Intake/Output Last 24 hours No intake or output data in the 24 hours ending 05/15/23 2228  Labs/Imaging Results for orders placed or performed during the hospital encounter of 05/15/23 (from the past 48 hour(s))  Basic metabolic panel     Status: Abnormal   Collection Time: 05/15/23  6:32 PM  Result Value Ref Range   Sodium 122 (L) 135 - 145 mmol/L   Potassium 4.5 3.5 - 5.1 mmol/L   Chloride 95 (L) 98 - 111 mmol/L   CO2 18 (L) 22 - 32 mmol/L  Glucose, Bld 95 70 - 99 mg/dL    Comment: Glucose reference range applies only to samples taken after fasting for at least 8 hours.   BUN 18 8 - 23 mg/dL   Creatinine, Ser 1.61 0.61 - 1.24 mg/dL   Calcium 8.1 (L) 8.9 - 10.3 mg/dL   GFR, Estimated >09 >60 mL/min    Comment: (NOTE) Calculated using the CKD-EPI Creatinine Equation (2021)    Anion gap 9 5 - 15    Comment: Performed at [facility], [address], [place], [place] [address]  CBC with Differential     Status: Abnormal   Collection Time: 05/15/23  6:32 PM  Result Value Ref Range   WBC 7.4 4.0 - 10.5 K/uL   RBC 4.07 (L) 4.22 - 5.81 MIL/uL    Hemoglobin 12.3 (L) 13.0 - 17.0 g/dL   HCT 81.1 (L) 91.4 - 78.2 %   MCV 88.2 80.0 - 100.0 fL   MCH 30.2 26.0 - 34.0 pg   MCHC 34.3 30.0 - 36.0 g/dL   RDW 95.6 21.3 - 08.6 %   Platelets 257 150 - 400 K/uL   nRBC 0.0 0.0 - 0.2 %   Neutrophils Relative % 48 %   Neutro Abs 3.7 1.7 - 7.7 K/uL   Lymphocytes Relative 33 %   Lymphs Abs 2.4 0.7 - 4.0 K/uL   Monocytes Relative 13 %   Monocytes Absolute 0.9 0.1 - 1.0 K/uL   Eosinophils Relative 5 %   Eosinophils Absolute 0.3 0.0 - 0.5 K/uL   Basophils Relative 1 %   Basophils Absolute 0.1 0.0 - 0.1 K/uL   Immature Granulocytes 0 %   Abs Immature Granulocytes 0.02 0.00 - 0.07 K/uL    Comment: Performed at [facility], [address], [place], [place] [address]  I-stat chem 8, ED     Status: Abnormal   Collection Time: 05/15/23  6:39 PM  Result Value Ref Range   Sodium 123 (L) 135 - 145 mmol/L   Potassium 4.5 3.5 - 5.1 mmol/L   Chloride 96 (L) 98 - 111 mmol/L   BUN 19 8 - 23 mg/dL   Creatinine, Ser 9.62 0.61 - 1.24 mg/dL   Glucose, Bld 96 70 - 99 mg/dL    Comment: Glucose reference range applies only to samples taken after fasting for at least 8 hours.   Calcium, Ion 1.10 (L) 1.15 - 1.40 mmol/L   TCO2 19 (L) 22 - 32 mmol/L   Hemoglobin 12.9 (L) 13.0 - 17.0 g/dL   HCT 95.2 (L) 84.1 - 32.4 %  Sodium, urine, random     Status: None   Collection Time: 05/15/23  9:38 PM  Result Value Ref Range   Sodium, Ur 70 mmol/L    Comment: Performed at [facility], [address], [place], [place] [address]  Osmolality, urine     Status: None   Collection Time: 05/15/23  9:38 PM  Result Value Ref Range   Osmolality, Ur 316 300 - 900 mOsm/kg    Comment: Performed at [facility], [address], [place], [place] [address]   No results found.  Pending Labs Unresulted Labs (From admission, onward)     Start     Ordered   05/16/23 0000  Basic metabolic panel  Now then every 6 hours,   R      05/15/23 2130   05/15/23 2133   Osmolality  Once,   URGENT  05/15/23 2132            Vitals/Pain Today's Vitals   05/15/23 2130 05/15/23 2145 05/15/23 2200 05/15/23 2215  BP: (!) 147/61 (!) 151/57 (!) 160/52 (!) 160/62  Pulse: 74 80 75 73  Resp: 18 19 18 17   Temp:      TempSrc:      SpO2: 98% 95% 99% 98%  Weight:      Height:      PainSc:        Isolation Precautions No active isolations  Medications Medications  sodium chloride tablet 1 g (has no administration in time range)  apixaban (ELIQUIS) tablet 5 mg (5 mg Oral Given 05/15/23 2215)  acetaminophen (TYLENOL) tablet 650 mg (650 mg Oral Given 05/15/23 2215)    Or  acetaminophen (TYLENOL) suppository 650 mg ( Rectal See Alternative 05/15/23 2215)  ondansetron (ZOFRAN) tablet 4 mg (has no administration in time range)    Or  ondansetron (ZOFRAN) injection 4 mg (has no administration in time range)  0.9 %  sodium chloride infusion ( Intravenous New Bag/Given 05/15/23 2029)    Mobility walks with person assist     Focused Assessments Hx of SIADH with low sodium   R Recommendations: See Admitting Provider Note  Report given to:   Additional Notes: pt is a DNR, #20G RtFA. MD Dc'd IV fluids

## 2023-05-16 ENCOUNTER — Inpatient Hospital Stay (HOSPITAL_COMMUNITY): Payer: No Typology Code available for payment source

## 2023-05-16 DIAGNOSIS — H9193 Unspecified hearing loss, bilateral: Secondary | ICD-10-CM | POA: Diagnosis present

## 2023-05-16 DIAGNOSIS — I4430 Unspecified atrioventricular block: Secondary | ICD-10-CM | POA: Diagnosis not present

## 2023-05-16 DIAGNOSIS — R531 Weakness: Secondary | ICD-10-CM | POA: Diagnosis not present

## 2023-05-16 DIAGNOSIS — I252 Old myocardial infarction: Secondary | ICD-10-CM | POA: Diagnosis not present

## 2023-05-16 DIAGNOSIS — M542 Cervicalgia: Secondary | ICD-10-CM | POA: Diagnosis not present

## 2023-05-16 DIAGNOSIS — Z66 Do not resuscitate: Secondary | ICD-10-CM | POA: Diagnosis present

## 2023-05-16 DIAGNOSIS — R140 Abdominal distension (gaseous): Secondary | ICD-10-CM | POA: Diagnosis present

## 2023-05-16 DIAGNOSIS — Z87891 Personal history of nicotine dependence: Secondary | ICD-10-CM | POA: Diagnosis not present

## 2023-05-16 DIAGNOSIS — J449 Chronic obstructive pulmonary disease, unspecified: Secondary | ICD-10-CM | POA: Diagnosis not present

## 2023-05-16 DIAGNOSIS — I10 Essential (primary) hypertension: Secondary | ICD-10-CM | POA: Diagnosis not present

## 2023-05-16 DIAGNOSIS — Z85820 Personal history of malignant melanoma of skin: Secondary | ICD-10-CM | POA: Diagnosis not present

## 2023-05-16 DIAGNOSIS — E871 Hypo-osmolality and hyponatremia: Secondary | ICD-10-CM | POA: Diagnosis present

## 2023-05-16 DIAGNOSIS — E869 Volume depletion, unspecified: Secondary | ICD-10-CM | POA: Diagnosis present

## 2023-05-16 DIAGNOSIS — Z743 Need for continuous supervision: Secondary | ICD-10-CM | POA: Diagnosis not present

## 2023-05-16 DIAGNOSIS — Z888 Allergy status to other drugs, medicaments and biological substances status: Secondary | ICD-10-CM | POA: Diagnosis not present

## 2023-05-16 DIAGNOSIS — K5900 Constipation, unspecified: Secondary | ICD-10-CM | POA: Diagnosis present

## 2023-05-16 DIAGNOSIS — I998 Other disorder of circulatory system: Secondary | ICD-10-CM | POA: Diagnosis present

## 2023-05-16 DIAGNOSIS — G629 Polyneuropathy, unspecified: Secondary | ICD-10-CM | POA: Diagnosis present

## 2023-05-16 DIAGNOSIS — E222 Syndrome of inappropriate secretion of antidiuretic hormone: Secondary | ICD-10-CM | POA: Diagnosis present

## 2023-05-16 DIAGNOSIS — Z7982 Long term (current) use of aspirin: Secondary | ICD-10-CM | POA: Diagnosis not present

## 2023-05-16 DIAGNOSIS — Z86711 Personal history of pulmonary embolism: Secondary | ICD-10-CM | POA: Diagnosis not present

## 2023-05-16 DIAGNOSIS — Z86718 Personal history of other venous thrombosis and embolism: Secondary | ICD-10-CM | POA: Diagnosis not present

## 2023-05-16 DIAGNOSIS — K219 Gastro-esophageal reflux disease without esophagitis: Secondary | ICD-10-CM | POA: Diagnosis present

## 2023-05-16 DIAGNOSIS — Z955 Presence of coronary angioplasty implant and graft: Secondary | ICD-10-CM | POA: Diagnosis not present

## 2023-05-16 DIAGNOSIS — E785 Hyperlipidemia, unspecified: Secondary | ICD-10-CM | POA: Diagnosis not present

## 2023-05-16 DIAGNOSIS — Z7401 Bed confinement status: Secondary | ICD-10-CM | POA: Diagnosis not present

## 2023-05-16 DIAGNOSIS — I4891 Unspecified atrial fibrillation: Secondary | ICD-10-CM | POA: Diagnosis not present

## 2023-05-16 DIAGNOSIS — W19XXXA Unspecified fall, initial encounter: Secondary | ICD-10-CM | POA: Diagnosis not present

## 2023-05-16 DIAGNOSIS — Z85828 Personal history of other malignant neoplasm of skin: Secondary | ICD-10-CM | POA: Diagnosis not present

## 2023-05-16 DIAGNOSIS — Z7901 Long term (current) use of anticoagulants: Secondary | ICD-10-CM | POA: Diagnosis not present

## 2023-05-16 DIAGNOSIS — M79631 Pain in right forearm: Secondary | ICD-10-CM | POA: Diagnosis not present

## 2023-05-16 DIAGNOSIS — N189 Chronic kidney disease, unspecified: Secondary | ICD-10-CM | POA: Diagnosis not present

## 2023-05-16 DIAGNOSIS — Z79899 Other long term (current) drug therapy: Secondary | ICD-10-CM | POA: Diagnosis not present

## 2023-05-16 DIAGNOSIS — G4733 Obstructive sleep apnea (adult) (pediatric): Secondary | ICD-10-CM | POA: Diagnosis present

## 2023-05-16 LAB — BASIC METABOLIC PANEL
Anion gap: 11 (ref 5–15)
Anion gap: 10 (ref 5–15)
BUN: 17 mg/dL (ref 8–23)
BUN: 15 mg/dL (ref 8–23)
CO2: 17 mmol/L — ABNORMAL LOW (ref 22–32)
CO2: 20 mmol/L — ABNORMAL LOW (ref 22–32)
Calcium: 8.2 mg/dL — ABNORMAL LOW (ref 8.9–10.3)
Calcium: 8.5 mg/dL — ABNORMAL LOW (ref 8.9–10.3)
Chloride: 95 mmol/L — ABNORMAL LOW (ref 98–111)
Chloride: 95 mmol/L — ABNORMAL LOW (ref 98–111)
Creatinine, Ser: 0.93 mg/dL (ref 0.61–1.24)
Creatinine, Ser: 0.94 mg/dL (ref 0.61–1.24)
GFR, Estimated: >60 mL/min (ref >60)
GFR, Estimated: >60 mL/min (ref >60)
Glucose, Bld: 83 mg/dL (ref 70–99)
Glucose, Bld: 83 mg/dL (ref 70–99)
Potassium: 4.1 mmol/L (ref 3.5–5.1)
Potassium: 4 mmol/L (ref 3.5–5.1)
Sodium: 123 mmol/L — ABNORMAL LOW (ref 135–145)
Sodium: 125 mmol/L — ABNORMAL LOW (ref 135–145)

## 2023-05-16 LAB — SODIUM
Sodium: 124 mmol/L — ABNORMAL LOW (ref 135–145)
Sodium: 124 mmol/L — ABNORMAL LOW (ref 135–145)

## 2023-05-16 LAB — OSMOLALITY: Osmolality: 265 mOsm/kg — ABNORMAL LOW (ref 275–295)

## 2023-05-16 MED ORDER — BRINZOLAMIDE 1 % OP SUSP
1.0000 [drp] | Freq: Three times a day (TID) | OPHTHALMIC | Status: DC
  Administered 2023-05-16 – 2023-05-18 (×7): 1 [drp] via OPHTHALMIC
  Filled 2023-05-16: qty 10

## 2023-05-16 MED ORDER — ATORVASTATIN CALCIUM 10 MG PO TABS
20.0000 mg | ORAL_TABLET | Freq: Every day | ORAL | Status: DC
  Administered 2023-05-16 – 2023-05-18 (×3): 20 mg via ORAL
  Filled 2023-05-16 (×3): qty 2

## 2023-05-16 MED ORDER — TRAZODONE HCL 50 MG PO TABS: 50.0000 mg | ORAL_TABLET | Freq: Every evening | ORAL | Status: DC | PRN

## 2023-05-16 MED ORDER — TAMSULOSIN HCL 0.4 MG PO CAPS
0.8000 mg | ORAL_CAPSULE | Freq: Every day | ORAL | Status: DC
  Administered 2023-05-16 – 2023-05-17 (×2): 0.8 mg via ORAL
  Filled 2023-05-16 (×2): qty 2

## 2023-05-16 MED ORDER — POLYETHYLENE GLYCOL 3350 17 G PO PACK
17.0000 g | PACK | Freq: Every day | ORAL | Status: DC | PRN
  Administered 2023-05-16: 17 g via ORAL
  Filled 2023-05-16: qty 1

## 2023-05-16 MED ORDER — FERROUS SULFATE 325 (65 FE) MG PO TABS
325.0000 mg | ORAL_TABLET | ORAL | Status: DC
  Administered 2023-05-16 – 2023-05-18 (×2): 325 mg via ORAL
  Filled 2023-05-16 (×2): qty 1

## 2023-05-16 MED ORDER — FUROSEMIDE 10 MG/ML IJ SOLN
10.0000 mg | Freq: Two times a day (BID) | INTRAMUSCULAR | Status: AC
  Administered 2023-05-16 – 2023-05-17 (×2): 10 mg via INTRAVENOUS
  Filled 2023-05-16 (×2): qty 2

## 2023-05-16 MED ORDER — LISINOPRIL 5 MG PO TABS
5.0000 mg | ORAL_TABLET | Freq: Every day | ORAL | Status: DC
  Administered 2023-05-16 – 2023-05-18 (×3): 5 mg via ORAL
  Filled 2023-05-16 (×3): qty 1

## 2023-05-16 MED ORDER — DOCUSATE SODIUM 100 MG PO CAPS
100.0000 mg | ORAL_CAPSULE | Freq: Two times a day (BID) | ORAL | Status: DC
  Administered 2023-05-16 – 2023-05-18 (×5): 100 mg via ORAL
  Filled 2023-05-16 (×5): qty 1

## 2023-05-16 MED ORDER — IPRATROPIUM-ALBUTEROL 0.5-2.5 (3) MG/3ML IN SOLN: 3.0000 mL | RESPIRATORY_TRACT | Status: DC | PRN

## 2023-05-16 MED ORDER — POLYVINYL ALCOHOL 1.4 % OP SOLN
1.0000 [drp] | Freq: Four times a day (QID) | OPHTHALMIC | Status: DC
  Administered 2023-05-16 – 2023-05-18 (×9): 1 [drp] via OPHTHALMIC
  Filled 2023-05-16: qty 15

## 2023-05-16 MED ORDER — GABAPENTIN 300 MG PO CAPS
300.0000 mg | ORAL_CAPSULE | Freq: Two times a day (BID) | ORAL | Status: DC
  Administered 2023-05-16 – 2023-05-18 (×5): 300 mg via ORAL
  Filled 2023-05-16 (×5): qty 1

## 2023-05-16 MED ORDER — FLUTICASONE FUROATE-VILANTEROL 200-25 MCG/ACT IN AEPB
1.0000 | INHALATION_SPRAY | Freq: Every day | RESPIRATORY_TRACT | Status: DC
  Administered 2023-05-16 – 2023-05-18 (×3): 1 via RESPIRATORY_TRACT
  Filled 2023-05-16: qty 28

## 2023-05-16 MED ORDER — OXYCODONE-ACETAMINOPHEN 5-325 MG PO TABS
1.0000 | ORAL_TABLET | ORAL | Status: DC | PRN
  Administered 2023-05-16 – 2023-05-18 (×3): 1 via ORAL
  Filled 2023-05-16 (×3): qty 1

## 2023-05-16 MED ORDER — SORBITOL 70 % SOLN
960.0000 mL | TOPICAL_OIL | Freq: Once | ORAL | Status: AC
  Administered 2023-05-16: 960 mL via RECTAL
  Filled 2023-05-16: qty 240

## 2023-05-16 MED ORDER — LATANOPROST 0.005 % OP SOLN
1.0000 [drp] | Freq: Every day | OPHTHALMIC | Status: DC
  Administered 2023-05-17: 1 [drp] via OPHTHALMIC
  Filled 2023-05-16: qty 2.5

## 2023-05-16 MED ORDER — BRIMONIDINE TARTRATE 0.2 % OP SOLN
1.0000 [drp] | Freq: Three times a day (TID) | OPHTHALMIC | Status: DC
  Administered 2023-05-16 – 2023-05-18 (×8): 1 [drp] via OPHTHALMIC
  Filled 2023-05-16: qty 5

## 2023-05-16 MED ORDER — ASPIRIN 81 MG PO TBEC
81.0000 mg | DELAYED_RELEASE_TABLET | Freq: Every day | ORAL | Status: DC
  Administered 2023-05-16 – 2023-05-18 (×3): 81 mg via ORAL
  Filled 2023-05-16 (×3): qty 1

## 2023-05-16 MED ORDER — UMECLIDINIUM BROMIDE 62.5 MCG/ACT IN AEPB
1.0000 | INHALATION_SPRAY | Freq: Every day | RESPIRATORY_TRACT | Status: DC
  Administered 2023-05-16 – 2023-05-18 (×3): 1 via RESPIRATORY_TRACT
  Filled 2023-05-16: qty 7

## 2023-05-16 MED ORDER — HYDRALAZINE HCL 20 MG/ML IJ SOLN: 10.0000 mg | INTRAMUSCULAR | Status: DC | PRN

## 2023-05-16 MED ORDER — SENNOSIDES-DOCUSATE SODIUM 8.6-50 MG PO TABS: 1.0000 | ORAL_TABLET | Freq: Every evening | ORAL | Status: DC | PRN

## 2023-05-16 MED ORDER — TRAZODONE HCL 50 MG PO TABS
25.0000 mg | ORAL_TABLET | Freq: Every day | ORAL | Status: DC
  Administered 2023-05-16 – 2023-05-17 (×2): 25 mg via ORAL
  Filled 2023-05-16 (×2): qty 1

## 2023-05-16 MED ORDER — BISACODYL 10 MG RE SUPP
10.0000 mg | Freq: Once | RECTAL | Status: AC
  Administered 2023-05-16: 10 mg via RECTAL
  Filled 2023-05-16: qty 1

## 2023-05-16 MED ORDER — PANTOPRAZOLE SODIUM 40 MG PO TBEC
40.0000 mg | DELAYED_RELEASE_TABLET | Freq: Two times a day (BID) | ORAL | Status: DC
  Administered 2023-05-16 – 2023-05-18 (×5): 40 mg via ORAL
  Filled 2023-05-16 (×5): qty 1

## 2023-05-16 NOTE — Progress Notes (Signed)
   05/16/23 0254  BiPAP/CPAP/SIPAP  $ Non-Invasive Ventilator  Non-Invasive Vent Set Up  $ Face Mask Medium Yes  BiPAP/CPAP/SIPAP Pt Type Adult  BiPAP/CPAP/SIPAP Resmed  Mask Type Full face mask  Mask Size Medium  Respiratory Rate 18 breaths/min  Pressure Support  (PRESSURE OF 4)  Patient Home Equipment No  Auto Titrate No  CPAP/SIPAP surface wiped down Yes

## 2023-05-16 NOTE — Progress Notes (Addendum)
PROGRESS NOTE    Marcus Martin  ZOX:[id number] DOB: Jun 13, 1933 DOA: 05/15/2023 PCP: Vivien Presto, MD   Brief Narrative:  89 year old male with history of PE, NSTEMI, COPD, HTN, chronic hyponatremia with history of idiopathic SIADH comes to the ED from [facility] for evaluation of hyponatremia.  She was noted to have this on a routine blood work.   Assessment & Plan:  Principal Problem:   Hyponatremia Active Problems:   SIADH (syndrome of inappropriate ADH production) (HCC)   Lower limb ischemia     Assessment and Plan: * Hyponatremia Likely SIADH Looks like she has chronic hyponatremia with sodium levels around 128.  This appears to be SIADH.  Serum osmolality 265, urine awesome 316, urine sodium 70.  Currently on fluid restriction 1200 cc. - Admission sodium 119 > slowly improving 125. -Briefly spoke with nephro, suggested we can do trial of lasix 10mg  iv instead of urea. Will order it for now,  -Salt tablets started overnight  Abd Bloating -Will order AXR  Lower limb ischemia Cont eliquis, seen by vascular in the past  Severe GERD - PPI  COPD -As needed bronchodilators      DVT prophylaxis: Eliquis Code Status: DNR Family Communication:  Son update.  Ongoing management of her hyponatremia, continue hospital stay       Diet Orders (From admission, onward)     Start     Ordered   05/15/23 2130  Diet regular Room service appropriate? Yes; Fluid consistency: Thin; Fluid restriction: 1200 mL Fluid  Diet effective now       Question Answer Comment  Room service appropriate? Yes   Fluid consistency: Thin   Fluid restriction: 1200 mL Fluid      05/15/23 2130            Subjective: Abd bloating and constipation  No other complaints  Reports last BM yesterday   Examination:  General exam: Appears calm and comfortable  Respiratory system: Clear to auscultation. Respiratory effort normal. Cardiovascular system: S1 & S2 heard, RRR.  No JVD, murmurs, rubs, gallops or clicks. No pedal edema. Gastrointestinal system: Abdomen is nondistended, soft and nontender. No organomegaly or masses felt. Normal bowel sounds heard. Central nervous system: Alert and oriented. No focal neurological deficits. Extremities: Symmetric 5 x 5 power. Skin: No rashes, lesions or ulcers Psychiatry: Judgement and insight appear normal. Mood & affect appropriate.  Objective: Vitals:   05/15/23 2230 05/15/23 2245 05/15/23 2315 05/16/23 0618  BP: (!) 153/66 (!) 150/64 (!) 170/67 135/72  Pulse: 80 70 72 67  Resp: (!) 25 14 20 20   Temp:   98.6 F (37 C) 98 F (36.7 C)  TempSrc:   Oral Oral  SpO2: 99% 100% 99% 97%  Weight:   66.8 kg   Height:   5\' 11"  (1.803 m)     Intake/Output Summary (Last 24 hours) at 05/16/2023 0819 Last data filed at 05/16/2023 0600 Gross per 24 hour  Intake 307.65 ml  Output 400 ml  Net -92.35 ml   Filed Weights   05/15/23 1903 05/15/23 2315  Weight: 68 kg 66.8 kg    Scheduled Meds:  apixaban  5 mg Oral BID   sodium chloride  1 g Oral TID WC   traZODone  25 mg Oral QHS   Continuous Infusions:  Nutritional status     Body mass index is 20.54 kg/m.  Data Reviewed:   CBC: Recent Labs  Lab 05/15/23 1832 05/15/23 1839  WBC 7.4  --  NEUTROABS 3.7  --   HGB 12.3* 12.9*  HCT 35.9* 38.0*  MCV 88.2  --   PLT 257  --    Basic Metabolic Panel: Recent Labs  Lab 05/15/23 1832 05/15/23 1839 05/16/23 0040 05/16/23 0652  NA 122* 123* 123* 125*  K 4.5 4.5 4.1 4.0  CL 95* 96* 95* 95*  CO2 18*  --  17* 20*  GLUCOSE 95 96 83 83  BUN 18 19 17 15   CREATININE 0.97 0.90 0.93 0.94  CALCIUM 8.1*  --  8.2* 8.5*   GFR: Estimated Creatinine Clearance: 52.3 mL/min (by C-G formula based on SCr of 0.94 mg/dL). Liver Function Tests: No results for input(s): "AST", "ALT", "ALKPHOS", "BILITOT", "PROT", "ALBUMIN" in the last 168 hours. No results for input(s): "LIPASE", "AMYLASE" in the last 168 hours. No  results for input(s): "AMMONIA" in the last 168 hours. Coagulation Profile: No results for input(s): "INR", "PROTIME" in the last 168 hours. Cardiac Enzymes: No results for input(s): "CKTOTAL", "CKMB", "CKMBINDEX", "TROPONINI" in the last 168 hours. BNP (last 3 results) No results for input(s): "PROBNP" in the last 8760 hours. HbA1C: No results for input(s): "HGBA1C" in the last 72 hours. CBG: No results for input(s): "GLUCAP" in the last 168 hours. Lipid Profile: No results for input(s): "CHOL", "HDL", "LDLCALC", "TRIG", "CHOLHDL", "LDLDIRECT" in the last 72 hours. Thyroid Function Tests: No results for input(s): "TSH", "T4TOTAL", "FREET4", "T3FREE", "THYROIDAB" in the last 72 hours. Anemia Panel: No results for input(s): "VITAMINB12", "FOLATE", "FERRITIN", "TIBC", "IRON", "RETICCTPCT" in the last 72 hours. Sepsis Labs: No results for input(s): "PROCALCITON", "LATICACIDVEN" in the last 168 hours.  No results found for this or any previous visit (from the past 240 hour(s)).       Radiology Studies: DG CHEST PORT 1 VIEW  Result Date: 05/15/2023 CLINICAL DATA:  Syndrome of inappropriate ADH production. EXAM: PORTABLE CHEST 1 VIEW COMPARISON:  Radiograph and CT 01/08/2023 FINDINGS: Chronic hyperinflation. Right upper lobe cavitary lesion has decreased surrounding soft tissue on the current exam. No definite acute airspace disease or new pulmonary mass. Stable heart size. Aortic atherosclerosis. No pneumothorax, pleural effusion or pulmonary edema. IMPRESSION: 1. Chronic hyperinflation. 2. Right upper lobe cavitary lesion, decreased surrounding soft tissue density when compared to February exam. Electronically Signed   By: Narda Rutherford M.D.   On: 05/15/2023 22:37           LOS: 0 days   Time spent= 35 mins    Derec Mozingo Joline Maxcy, MD [facility]  If 7PM-7AM, please contact night-coverage  05/16/2023, 8:19 AM

## 2023-05-17 LAB — BASIC METABOLIC PANEL
Anion gap: 7 (ref 5–15)
Anion gap: 9 (ref 5–15)
BUN: 17 mg/dL (ref 8–23)
BUN: 20 mg/dL (ref 8–23)
CO2: 21 mmol/L — ABNORMAL LOW (ref 22–32)
CO2: 22 mmol/L (ref 22–32)
Calcium: 8.1 mg/dL — ABNORMAL LOW (ref 8.9–10.3)
Calcium: 7.9 mg/dL — ABNORMAL LOW (ref 8.9–10.3)
Chloride: 94 mmol/L — ABNORMAL LOW (ref 98–111)
Chloride: 96 mmol/L — ABNORMAL LOW (ref 98–111)
Creatinine, Ser: 0.98 mg/dL (ref 0.61–1.24)
Creatinine, Ser: 1.31 mg/dL — ABNORMAL HIGH (ref 0.61–1.24)
GFR, Estimated: >60 mL/min (ref >60)
GFR, Estimated: 53 mL/min — ABNORMAL LOW (ref >60)
Glucose, Bld: 89 mg/dL (ref 70–99)
Glucose, Bld: 113 mg/dL — ABNORMAL HIGH (ref 70–99)
Potassium: 4.1 mmol/L (ref 3.5–5.1)
Potassium: 4.1 mmol/L (ref 3.5–5.1)
Sodium: 122 mmol/L — ABNORMAL LOW (ref 135–145)
Sodium: 127 mmol/L — ABNORMAL LOW (ref 135–145)

## 2023-05-17 LAB — CBC
HCT: 34.3 % — ABNORMAL LOW (ref 39.0–52.0)
Hemoglobin: 12 g/dL — ABNORMAL LOW (ref 13.0–17.0)
MCH: 31 pg (ref 26.0–34.0)
MCHC: 35 g/dL (ref 30.0–36.0)
MCV: 88.6 fL (ref 80.0–100.0)
Platelets: 265 10*3/uL (ref 150–400)
RBC: 3.87 MIL/uL — ABNORMAL LOW (ref 4.22–5.81)
RDW: 12.9 % (ref 11.5–15.5)
WBC: 8.8 10*3/uL (ref 4.0–10.5)
nRBC: 0 % (ref 0.0–0.2)

## 2023-05-17 LAB — SODIUM, URINE, RANDOM: Sodium, Ur: 43 mmol/L

## 2023-05-17 LAB — OSMOLALITY, URINE: Osmolality, Ur: 288 mOsm/kg — ABNORMAL LOW (ref 300–900)

## 2023-05-17 LAB — MAGNESIUM: Magnesium: 2 mg/dL (ref 1.7–2.4)

## 2023-05-17 LAB — OSMOLALITY: Osmolality: 269 mOsm/kg — ABNORMAL LOW (ref 275–295)

## 2023-05-17 MED ORDER — SODIUM CHLORIDE 0.9 % IV SOLN: INTRAVENOUS | Status: AC

## 2023-05-17 NOTE — TOC CM/SW Note (Signed)
Transition of Care [facility]) - Inpatient Brief Assessment   Patient Details  Name: Marcus Martin MRN: [id number] Date of Birth: 01-24-1933  Transition of Care [facility]) CM/SW Contact:    Tom-Johnson, Hershal Coria, RN Phone Number: 05/17/2023, 12:31 PM   Clinical Narrative:  Patient is admitted with Hyponatremia. From [facility].  CM called the [facility] 72 hrs Notification hotline and spoke with Lauren. Notification # is [phone].    Transition of Care Asessment: Insurance and Status: Insurance coverage has been reviewed Patient has primary care physician: Yes Home environment has been reviewed: Yes Prior level of function:: Assisted Living Facility Prior/Current Home Services: Current home services (ALF) Social Determinants of Health Reivew: SDOH reviewed no interventions necessary Readmission risk has been reviewed: Yes Transition of care needs: transition of care needs identified, TOC will continue to follow (Awaiting PT/OT eval.)

## 2023-05-17 NOTE — Progress Notes (Signed)
   05/16/23 2345  BiPAP/CPAP/SIPAP  BiPAP/CPAP/SIPAP Pt Type Adult  BiPAP/CPAP/SIPAP Resmed  Mask Type Full face mask  Mask Size Medium  Respiratory Rate 16 breaths/min  EPAP 4 cmH2O  FiO2 (%) 21 %  Patient Home Equipment No  Auto Titrate No  CPAP/SIPAP surface wiped down Yes  BiPAP/CPAP /SiPAP Vitals  Pulse Rate 89  Resp 16  SpO2 98 %  MEWS Score/Color  MEWS Score 0  MEWS Score Color Marcus Martin Si

## 2023-05-17 NOTE — Progress Notes (Signed)
PROGRESS NOTE    Marcus Martin  ZOX:[id number] DOB: May 12, 1933 DOA: 05/15/2023 PCP: Vivien Presto, MD   Brief Narrative:  89 year old male with history of PE, NSTEMI, COPD, HTN, chronic hyponatremia with history of idiopathic SIADH comes to the ED from [facility] for evaluation of hyponatremia.  She was noted to have this on a routine blood work.  05/17/2023: Sodium dropped to 122 earlier today.  Will repeat urine sodium, urine osmolality and serum osmolality.  Suspect hyponatremia secondary to combined volume depletion and SIADH.  Gentle hydration.   Assessment & Plan:  Principal Problem:   Hyponatremia Active Problems:   SIADH (syndrome of inappropriate ADH production) (HCC)   Lower limb ischemia     Assessment and Plan: * Hyponatremia Likely SIADH Looks like she has chronic hyponatremia with sodium levels around 128.  This appears to be SIADH.  Serum osmolality 265, urine awesome 316, urine sodium 70.  Currently on fluid restriction 1200 cc. - Admission sodium 119 > slowly improving 125. -Briefly spoke with nephro, suggested we can do trial of lasix 10mg  iv instead of urea. Will order it for now,  -Salt tablets started overnight 05/17/2023: Suspect secondary to volume depletion and SIADH.  Gentle hydration.  Repeat urine sodium, urine osmolality and serum osmolality.  Follow sodium level.  Abd Bloating -Abdominal x-ray is nonrevealing.  Lower limb ischemia Cont eliquis, seen by vascular in the past  Severe GERD - PPI  COPD -As needed bronchodilators      DVT prophylaxis: Eliquis Code Status: DNR Family Communication:   Ongoing management of her hyponatremia, continue hospital stay   Diet Orders (From admission, onward)     Start     Ordered   05/15/23 2130  Diet regular Room service appropriate? Yes; Fluid consistency: Thin; Fluid restriction: 1200 mL Fluid  Diet effective now       Question Answer Comment  Room service appropriate? Yes    Fluid consistency: Thin   Fluid restriction: 1200 mL Fluid      05/15/23 2130            Subjective: No new complaints. Sodium is down to 122.   Examination:  General exam: Appears calm and comfortable.  Dry buccal mucosa. Respiratory system: Clear to auscultation. Respiratory effort normal. Cardiovascular system: S1 & S2 heard,. Gastrointestinal system: Vague lower abdominal tenderness.  Central nervous system: Alert and oriented.  Extremities: Fullness of the ankle..   Objective: Vitals:   05/16/23 2345 05/17/23 0450 05/17/23 0834 05/17/23 1011  BP:  (!) 139/53 (!) 115/47 (!) 112/48  Pulse: 89 68 85   Resp: 16 18 16    Temp:  97.6 F (36.4 C) 97.8 F (36.6 C)   TempSrc:   Oral   SpO2: 98% 97% 100%   Weight:      Height:        Intake/Output Summary (Last 24 hours) at 05/17/2023 1456 Last data filed at 05/17/2023 1245 Gross per 24 hour  Intake 1240 ml  Output 500 ml  Net 740 ml    Filed Weights   05/15/23 1903 05/15/23 2315  Weight: 68 kg 66.8 kg    Scheduled Meds:  apixaban  5 mg Oral BID   aspirin EC  81 mg Oral Daily   atorvastatin  20 mg Oral Daily   brinzolamide  1 drop Both Eyes TID   And   brimonidine  1 drop Both Eyes TID   docusate sodium  100 mg Oral BID   ferrous sulfate  325 mg Oral Q M,W,F   fluticasone furoate-vilanterol  1 puff Inhalation Daily   gabapentin  300 mg Oral BID   latanoprost  1 drop Both Eyes QHS   lisinopril  5 mg Oral Daily   pantoprazole  40 mg Oral BID AC   polyvinyl alcohol  1 drop Both Eyes QID   sodium chloride  1 g Oral TID WC   tamsulosin  0.8 mg Oral QHS   traZODone  25 mg Oral QHS   umeclidinium bromide  1 puff Inhalation Daily   Continuous Infusions:  Nutritional status     Body mass index is 20.54 kg/m.  Data Reviewed:   CBC: Recent Labs  Lab 05/15/23 1832 05/15/23 1839 05/17/23 0040  WBC 7.4  --  8.8  NEUTROABS 3.7  --   --   HGB 12.3* 12.9* 12.0*  HCT 35.9* 38.0* 34.3*  MCV 88.2   --  88.6  PLT 257  --  265    Basic Metabolic Panel: Recent Labs  Lab 05/15/23 1832 05/15/23 1839 05/16/23 0040 05/16/23 0652 05/16/23 1152 05/16/23 1820 05/17/23 0040  NA 122* 123* 123* 125* 124* 124* 122*  K 4.5 4.5 4.1 4.0  --   --  4.1  CL 95* 96* 95* 95*  --   --  94*  CO2 18*  --  17* 20*  --   --  21*  GLUCOSE 95 96 83 83  --   --  89  BUN 18 19 17 15   --   --  17  CREATININE 0.97 0.90 0.93 0.94  --   --  0.98  CALCIUM 8.1*  --  8.2* 8.5*  --   --  8.1*  MG  --   --   --   --   --   --  2.0    GFR: Estimated Creatinine Clearance: 50.2 mL/min (by C-G formula based on SCr of 0.98 mg/dL). Liver Function Tests: No results for input(s): "AST", "ALT", "ALKPHOS", "BILITOT", "PROT", "ALBUMIN" in the last 168 hours. No results for input(s): "LIPASE", "AMYLASE" in the last 168 hours. No results for input(s): "AMMONIA" in the last 168 hours. Coagulation Profile: No results for input(s): "INR", "PROTIME" in the last 168 hours. Cardiac Enzymes: No results for input(s): "CKTOTAL", "CKMB", "CKMBINDEX", "TROPONINI" in the last 168 hours. BNP (last 3 results) No results for input(s): "PROBNP" in the last 8760 hours. HbA1C: No results for input(s): "HGBA1C" in the last 72 hours. CBG: No results for input(s): "GLUCAP" in the last 168 hours. Lipid Profile: No results for input(s): "CHOL", "HDL", "LDLCALC", "TRIG", "CHOLHDL", "LDLDIRECT" in the last 72 hours. Thyroid Function Tests: No results for input(s): "TSH", "T4TOTAL", "FREET4", "T3FREE", "THYROIDAB" in the last 72 hours. Anemia Panel: No results for input(s): "VITAMINB12", "FOLATE", "FERRITIN", "TIBC", "IRON", "RETICCTPCT" in the last 72 hours. Sepsis Labs: No results for input(s): "PROCALCITON", "LATICACIDVEN" in the last 168 hours.  No results found for this or any previous visit (from the past 240 hour(s)).       Radiology Studies: DG Abd 1 View  Result Date: 05/16/2023 CLINICAL DATA:  Abdominal pain and  distention EXAM: ABDOMEN - 1 VIEW COMPARISON:  11/01/2021 FINDINGS: Single supine view of the abdomen and pelvis. IVC filter. Gas within nondistended large and small bowel loops. Distal gas and stool. No bowel obstruction. No gross free intraperitoneal air. Lower right abdominal calcification is similar to on the prior and within the subcutaneous fat of the upper posterior pelvis when  correlated with prior CT. No other abnormal calcifications identified. IMPRESSION: No acute findings. Electronically Signed   By: Jeronimo Greaves M.D.   On: 05/16/2023 14:32   DG CHEST PORT 1 VIEW  Result Date: 05/15/2023 CLINICAL DATA:  Syndrome of inappropriate ADH production. EXAM: PORTABLE CHEST 1 VIEW COMPARISON:  Radiograph and CT 01/08/2023 FINDINGS: Chronic hyperinflation. Right upper lobe cavitary lesion has decreased surrounding soft tissue on the current exam. No definite acute airspace disease or new pulmonary mass. Stable heart size. Aortic atherosclerosis. No pneumothorax, pleural effusion or pulmonary edema. IMPRESSION: 1. Chronic hyperinflation. 2. Right upper lobe cavitary lesion, decreased surrounding soft tissue density when compared to February exam. Electronically Signed   By: Narda Rutherford M.D.   On: 05/15/2023 22:37           LOS: 1 day   Time spent= 35 mins    Barnetta Chapel, MD [facility]  If 7PM-7AM, please contact night-coverage  05/17/2023, 2:56 PM

## 2023-05-17 NOTE — Evaluation (Signed)
Occupational Therapy Evaluation Patient Details Name: Marcus Martin MRN: [id number] DOB: [birth year] Today's Date: 05/17/2023   History of Present Illness 89 y/o M admitted to [facility] on 6/11 for hyponatremia. PMHx: PE, NSTEMI, COPD, HTN, chronic hyponatremia with history of idiopathic SIADH, LLE ischemia   Clinical Impression   Pt is a resident of [facility]. At baseline, pt receives Min assist with washing back and LB bathing and Min assist with LB dressing. Pt previously completed self feeding, grooming, UB dressing, all steps of toileting task, and functional transfers/mobility with use of an upright walker Independent to Mod I. At baseline, pt receives assistance for all IADLs including transportationa. Pt now presents with decreased activity tolerance, generalized B UE weakness, decreased dynamic standing balance during functional tasks, and decreased safety and independence with ADLs and functional transfers/mobility. Pt currently completes UB ADLs Mod I to Min guard assist, LB ADLs with Min guard to Mod assist, and functional transfers/mobility with a RW with Min guard assist and occasional cues for safety. Pt will benefit from acute skilled OT services to address deficits, decreased caregiver burden, and increase safety and independence with ADLs and functional mobility/transfers. Post acute discharge, pt will benefit from continued skilled OT services in the home to maximize rehab potential.      Recommendations for follow up therapy are one component of a multi-disciplinary discharge planning process, led by the attending physician.  Recommendations may be updated based on patient status, additional functional criteria and insurance authorization.   Assistance Recommended at Discharge Frequent or constant Supervision/Assistance  Patient can return home with the following A little help with walking and/or transfers;A lot of help with bathing/dressing/bathroom;Assistance with  cooking/housework;Direct supervision/assist for medications management;Direct supervision/assist for financial management;Assist for transportation;Help with stairs or ramp for entrance    Functional Status Assessment  Patient has had a recent decline in their functional status and demonstrates the ability to make significant improvements in function in a reasonable and predictable amount of time.  Equipment Recommendations  None recommended by OT    Recommendations for Other Services       Precautions / Restrictions Precautions Precautions: Fall Restrictions Weight Bearing Restrictions: No      Mobility Bed Mobility Overal bed mobility: Needs Assistance Bed Mobility: Supine to Sit, Sit to Supine     Supine to sit: Supervision, HOB elevated Sit to supine: Supervision, HOB elevated   General bed mobility comments: with HOB elevated and use of bed rail    Transfers Overall transfer level: Needs assistance Equipment used: Rolling walker (2 wheels) Transfers: Sit to/from Stand Sit to Stand: Min guard           General transfer comment: Min guard for safety with cues for hand placement      Balance Overall balance assessment: Needs assistance Sitting-balance support: No upper extremity supported, Feet supported Sitting balance-Leahy Scale: Good     Standing balance support: Bilateral upper extremity supported, During functional activity, Reliant on assistive device for balance Standing balance-Leahy Scale: Poor Standing balance comment: reliant on RW for balance during functional activities                           ADL either performed or assessed with clinical judgement   ADL Overall ADL's : Needs assistance/impaired Eating/Feeding: Modified independent   Grooming: Set up;Sitting   Upper Body Bathing: Min guard;Sitting   Lower Body Bathing: Moderate assistance;Sit to/from stand;Cueing for safety   Upper Body Dressing :  Min guard   Lower Body  Dressing: Moderate assistance;Cueing for safety   Toilet Transfer: Min guard;BSC/3in1;Rolling walker (2 wheels);Ambulation (BSC placed over toilet)   Toileting- Clothing Manipulation and Hygiene: Minimal assistance;Sitting/lateral lean;Cueing for safety (Cues for hand placement)       Functional mobility during ADLs: Min guard;Rolling walker (2 wheels);Cueing for safety       Vision Baseline Vision/History: 1 Wears glasses;4 Cataracts (Wears progressives. Hx of Bilateral cataracts with prior Bilateral cataract surgery) Ability to See in Adequate Light: 2 Moderately impaired Patient Visual Report: No change from baseline       Perception     Praxis Praxis Praxis tested?: Within functional limits    Pertinent Vitals/Pain Pain Assessment Pain Assessment: 0-10 Pain Score: 1  Pain Location: R LE Pain Descriptors / Indicators: Aching Pain Intervention(s): Limited activity within patient's tolerance     Hand Dominance Right   Extremity/Trunk Assessment Upper Extremity Assessment Upper Extremity Assessment: Generalized weakness   Lower Extremity Assessment Lower Extremity Assessment: Defer to PT evaluation RLE Deficits / Details: Pt continues to report pain in R LE RLE Sensation: decreased light touch LLE Deficits / Details: grossly 4/5 throughout LLE Sensation: decreased light touch   Cervical / Trunk Assessment Cervical / Trunk Assessment: Kyphotic   Communication Communication Communication: HOH   Cognition Arousal/Alertness: Awake/alert Behavior During Therapy: WFL for tasks assessed/performed Overall Cognitive Status: No family/caregiver present to determine baseline cognitive functioning                                 General Comments: AAOx4 with pt demonstrating ability presenting with no praxis deficits noted. Pt presents with impaired short term memory and mildly impaired safety awareness. However, this appears to be pt's baseline. No  family/caregiver present this day to confirm baseline cognition.     General Comments  VSS throughout session on RA. Pt pleasant throughout seesion and participated well.    Exercises     Shoulder Instructions      Home Living Family/patient expects to be discharged to:: Assisted living                             Home Equipment: Other (comment);Grab bars - tub/shower;Grab bars - toilet;Shower seat - built in   Additional Comments: [facility]      Prior Functioning/Environment Prior Level of Function : Needs assist             Mobility Comments: Pt was previously Mod I for functional mobility with upright walker. ADLs Comments: Pt reports Min assist with washing back and LB bathing and Min assist with LB dressing. Pt receives assistance for all IADLs. Pt previously completed self feeding, grooming, UB dressing, and all steps of toileting task Independent to Mod I. Pt's son provides transportation.       OT Problem List: Decreased strength;Decreased activity tolerance;Impaired balance (sitting and/or standing)      OT Treatment/Interventions: Self-care/ADL training;Therapeutic exercise;Energy conservation;DME and/or AE instruction;Therapeutic activities;Patient/family education;Balance training    OT Goals(Current goals can be found in the care plan section) Acute Rehab OT Goals Patient Stated Goal: To return home, not have R LE pain, and have more energy OT Goal Formulation: With patient Time For Goal Achievement: 05/31/23 Potential to Achieve Goals: Good ADL Goals Pt Will Perform Grooming: with modified independence;standing (completing 3 grooming tasks) Pt Will Perform Upper Body Dressing: with  modified independence;sitting Pt Will Transfer to Toilet: with modified independence;ambulating;regular height toilet;grab bars (with least restrictive AD) Pt Will Perform Toileting - Clothing Manipulation and hygiene: with modified independence;sit  to/from stand Additional ADL Goal #1: Patient will demonstrate ability to identify and gather 3 or more items needed for grooming tasks in room with Mod I and least restrictive AD.  OT Frequency: Min 2X/week    Co-evaluation              AM-PAC OT "6 Clicks" Daily Activity     Outcome Measure Help from another person eating meals?: None Help from another person taking care of personal grooming?: A Little Help from another person toileting, which includes using toliet, bedpan, or urinal?: A Little Help from another person bathing (including washing, rinsing, drying)?: A Lot Help from another person to put on and taking off regular upper body clothing?: A Little Help from another person to put on and taking off regular lower body clothing?: A Lot 6 Click Score: 17   End of Session Equipment Utilized During Treatment: Gait belt;Rolling walker (2 wheels)  Activity Tolerance: Patient tolerated treatment well Patient left: in bed;with call bell/phone within reach;with bed alarm set  OT Visit Diagnosis: Unsteadiness on feet (R26.81);Muscle weakness (generalized) (M62.81);Other (comment) (Decreased activity tolerance)                Time: 1610-9604 OT Time Calculation (min): 22 min Charges:  OT General Charges $OT Visit: 1 Visit OT Evaluation $OT Eval Low Complexity: 1 Low  [address] M., OTR/L, MA Acute Rehab [phone]   Lendon Colonel 05/17/2023, 3:55 PM

## 2023-05-17 NOTE — TOC Progression Note (Signed)
Transition of Care [facility]) - Initial/Assessment Note    Patient Details  Name: Marcus Martin MRN: [id number] Date of Birth: May 03, 1933  Transition of Care [facility]) CM/SW Contact:    Ralene Bathe, LCSW Phone Number: 05/17/2023, 12:27 PM  Clinical Narrative:                 LCSW spoke with Eber Jones, Child psychotherapist, and Meryle Ready in admissions at [facility].  The patient is currently a resident in the assisted living facility.  LCSW informed the facility that PT/OT recommendations are still pending and was informed that the patient can return and receive physical therapy if needed.  TOC will continue to follow.     Patient Goals and CMS Choice            Expected Discharge Plan and Services                                              Prior Living Arrangements/Services                       Activities of Daily Living Home Assistive Devices/Equipment: None ADL Screening (condition at time of admission) Patient's cognitive ability adequate to safely complete daily activities?: Yes Is the patient deaf or have difficulty hearing?: Yes Does the patient have difficulty seeing, even when wearing glasses/contacts?: No Does the patient have difficulty concentrating, remembering, or making decisions?: No Patient able to express need for assistance with ADLs?: Yes Does the patient have difficulty dressing or bathing?: No Independently performs ADLs?: Yes (appropriate for developmental age) Does the patient have difficulty walking or climbing stairs?: Yes Weakness of Legs: Right Weakness of Arms/Hands: None  Permission Sought/Granted                  Emotional Assessment              Admission diagnosis:  Hyponatremia [E87.1] Patient Active Problem List   Diagnosis Date Noted   Anemia 01/20/2023   Urinary retention 01/18/2023   Lower limb ischemia 01/17/2023   COPD exacerbation (HCC) 01/09/2023   Malnutrition of moderate degree 11/19/2021    Cholelithiasis without cholecystitis 11/17/2021   Aortic atherosclerosis (HCC) 11/17/2021   Acute sore throat 11/17/2021   Altered mental status 11/16/2021   SIADH (syndrome of inappropriate ADH production) (HCC)    Gastroesophageal reflux disease with esophagitis without hemorrhage    Acute hypoxic respiratory failure (HCC)    Abdominal distention    Fall    Hyponatremia    Acute encephalopathy 10/31/2021   PCP:  Vivien Presto, MD Pharmacy:   [facility] - [place], [place] - [address] [place] [address] Phone: [phone] Fax: [phone]  [facility] [address] [place] [address] Phone: [phone] Fax: [phone]     Social Determinants of Health (SDOH) Social History: SDOH Screenings   Food Insecurity: No Food Insecurity (05/15/2023)  Housing: Low Risk  (05/15/2023)  Transportation Needs: No Transportation Needs (05/15/2023)  Utilities: Not At Risk (05/15/2023)  Tobacco Use: Medium Risk (05/15/2023)   SDOH Interventions:     Readmission Risk Interventions     No data to display

## 2023-05-17 NOTE — Evaluation (Signed)
Physical Therapy Evaluation Patient Details Name: Marcus Martin MRN: [id number] DOB: [birth year] Today's Date: 05/17/2023  History of Present Illness  89 y/o M admitted to [facility] on 6/11 for hyponatremia. 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 Pt performing bed mobility, transfers, and ambulation today with RW and supervision to minG, mild cues for RW management with turns but no overt LOB. Pt reports his mobility is slower than baseline and would like to continue to progress. Acute PT will continue to follow pt during admission to progress overall mobility, recommend HHPT upon discharge, will follow as appropriate.       Recommendations for follow up therapy are one component of a multi-disciplinary discharge planning process, led by the attending physician.  Recommendations may be updated based on patient status, additional functional criteria and insurance authorization.  Follow Up Recommendations       Assistance Recommended at Discharge Intermittent Supervision/Assistance  Patient can return home with the following  A little help with walking and/or transfers;Assist for transportation    Equipment Recommendations None recommended by PT  Recommendations for Other Services       Functional Status Assessment Patient has had a recent decline in their functional status and demonstrates the ability to make significant improvements in function in a reasonable and predictable amount of time.     Precautions / Restrictions Precautions Precautions: Fall Restrictions Weight Bearing Restrictions: No      Mobility  Bed Mobility Overal bed mobility: Needs Assistance Bed Mobility: Supine to Sit, Sit to Supine     Supine to sit: Supervision, HOB elevated Sit to supine: Supervision, HOB elevated    General bed mobility comments: with HOB elevated and use of bed rail    Transfers Overall transfer level: Needs assistance Equipment used: Rolling walker (2 wheels) Transfers: Sit to/from Stand Sit to Stand: Min guard           General transfer comment: minG for safety, cued for proper hand placement, performed x2 trials to adjust RW to proper height before ambulating    Ambulation/Gait Ambulation/Gait assistance: Min guard Gait Distance (Feet): 100 Feet Assistive device: Rolling walker (2 wheels) Gait Pattern/deviations: Step-through pattern, Decreased stride length, Knee flexed in stance - left, Trunk flexed, Drifts right/left Gait velocity: decreased     General Gait Details: increased trunk flexion and downward gaze, cueing for upright posture. Noted L knee flexion during stance phase, minG for safety and balance with turns but no overt LOB noted during today's session  Stairs            Wheelchair Mobility    Modified Rankin (Stroke Patients Only)       Balance Overall balance assessment: Needs assistance Sitting-balance support: No upper extremity supported, Feet supported Sitting balance-Leahy Scale: Good     Standing balance support: Bilateral upper extremity supported, During functional activity, Reliant on assistive device for balance Standing balance-Leahy Scale: Poor Standing balance comment: reliant on RW for ambulation                             Pertinent Vitals/Pain Pain Assessment Pain Assessment: 0-10 Pain Score: 3  Pain Location: stomach, RLE Pain Descriptors / Indicators: Aching Pain Intervention(s): Limited activity within patient's tolerance, Monitored during session, Repositioned    Home Living Family/patient expects to  be discharged to:: Assisted living                 Home Equipment: Other (comment) (upright rollator) Additional Comments: [facility]    Prior Function Prior Level of Function :  Needs assist             Mobility Comments: modI with upright walker ADLs Comments: assist with ADLs and iADLs, son provides transportation     Hand Dominance   Dominant Hand: Right    Extremity/Trunk Assessment   Upper Extremity Assessment Upper Extremity Assessment: Defer to OT evaluation    Lower Extremity Assessment Lower Extremity Assessment: RLE deficits/detail;LLE deficits/detail RLE Deficits / Details: able to move against gravity, at least 3/5 throughout, no resistance due to pain RLE Sensation: decreased light touch LLE Deficits / Details: grossly 4/5 throughout LLE Sensation: decreased light touch    Cervical / Trunk Assessment Cervical / Trunk Assessment: Kyphotic  Communication   Communication: HOH  Cognition Arousal/Alertness: Awake/alert Behavior During Therapy: WFL for tasks assessed/performed Overall Cognitive Status: No family/caregiver present to determine baseline cognitive functioning                                 General Comments: A&Ox4, reporting year as 2042 but able to correct with cues. Pt HOH but able to follow commands and answer questions when he hears them, pleasant throughout session        General Comments General comments (skin integrity, edema, etc.): no family at bedside, pt pleasant throughout    Exercises     Assessment/Plan    PT Assessment Patient needs continued PT services  PT Problem List Decreased strength;Decreased activity tolerance;Decreased balance;Decreased mobility;Pain       PT Treatment Interventions DME instruction;Gait training;Functional mobility training;Therapeutic activities;Therapeutic exercise;Balance training;Neuromuscular re-education;Patient/family education    PT Goals (Current goals can be found in the Care Plan section)  Acute Rehab PT Goals Patient Stated Goal: get better and go home PT Goal Formulation: With patient Time For Goal Achievement: 05/31/23 Potential to Achieve  Goals: Good    Frequency Min 2X/week     Co-evaluation               AM-PAC PT "6 Clicks" Mobility  Outcome Measure Help needed turning from your back to your side while in a flat bed without using bedrails?: A Little Help needed moving from lying on your back to sitting on the side of a flat bed without using bedrails?: A Little Help needed moving to and from a bed to a chair (including a wheelchair)?: A Little Help needed standing up from a chair using your arms (e.g., wheelchair or bedside chair)?: A Little Help needed to walk in hospital room?: A Little Help needed climbing 3-5 steps with a railing? : A Little 6 Click Score: 18    End of Session Equipment Utilized During Treatment: Gait belt Activity Tolerance: Patient tolerated treatment well Patient left: in bed;with call bell/phone within reach Nurse Communication: Mobility status PT Visit Diagnosis: Other abnormalities of gait and mobility (R26.89);Muscle weakness (generalized) (M62.81)    Time: 5409-8119 PT Time Calculation (min) (ACUTE ONLY): 14 min   Charges:   PT Evaluation $PT Eval Low Complexity: 1 Low          Lindalou Hose, PT DPT Acute Rehabilitation Services Office [phone]   Leonie Man 05/17/2023, 2:29 PM

## 2023-05-18 LAB — BASIC METABOLIC PANEL
Anion gap: 7 (ref 5–15)
Anion gap: 8 (ref 5–15)
BUN: 22 mg/dL (ref 8–23)
BUN: 27 mg/dL — ABNORMAL HIGH (ref 8–23)
CO2: 22 mmol/L (ref 22–32)
CO2: 21 mmol/L — ABNORMAL LOW (ref 22–32)
Calcium: 7.9 mg/dL — ABNORMAL LOW (ref 8.9–10.3)
Calcium: 7.9 mg/dL — ABNORMAL LOW (ref 8.9–10.3)
Chloride: 96 mmol/L — ABNORMAL LOW (ref 98–111)
Chloride: 97 mmol/L — ABNORMAL LOW (ref 98–111)
Creatinine, Ser: 1.27 mg/dL — ABNORMAL HIGH (ref 0.61–1.24)
Creatinine, Ser: 1.17 mg/dL (ref 0.61–1.24)
GFR, Estimated: 55 mL/min — ABNORMAL LOW (ref >60)
GFR, Estimated: >60 mL/min (ref >60)
Glucose, Bld: 92 mg/dL (ref 70–99)
Glucose, Bld: 95 mg/dL (ref 70–99)
Potassium: 4.3 mmol/L (ref 3.5–5.1)
Potassium: 3.9 mmol/L (ref 3.5–5.1)
Sodium: 125 mmol/L — ABNORMAL LOW (ref 135–145)
Sodium: 126 mmol/L — ABNORMAL LOW (ref 135–145)

## 2023-05-18 LAB — CBC
HCT: 33.6 % — ABNORMAL LOW (ref 39.0–52.0)
Hemoglobin: 11.4 g/dL — ABNORMAL LOW (ref 13.0–17.0)
MCH: 30 pg (ref 26.0–34.0)
MCHC: 33.9 g/dL (ref 30.0–36.0)
MCV: 88.4 fL (ref 80.0–100.0)
Platelets: 232 10*3/uL (ref 150–400)
RBC: 3.8 MIL/uL — ABNORMAL LOW (ref 4.22–5.81)
RDW: 13 % (ref 11.5–15.5)
WBC: 7.7 10*3/uL (ref 4.0–10.5)
nRBC: 0 % (ref 0.0–0.2)

## 2023-05-18 LAB — MAGNESIUM: Magnesium: 2.1 mg/dL (ref 1.7–2.4)

## 2023-05-18 MED ORDER — LISINOPRIL 5 MG PO TABS: 5.0000 mg | ORAL_TABLET | Freq: Every day | ORAL | 1 refills | Status: DC

## 2023-05-18 MED ORDER — SENNOSIDES-DOCUSATE SODIUM 8.6-50 MG PO TABS: 1.0000 | ORAL_TABLET | Freq: Every evening | ORAL | 0 refills | Status: AC | PRN

## 2023-05-18 NOTE — Discharge Summary (Signed)
Physician Discharge Summary  Patient ID: Marcus Martin MRN: [id number] DOB/AGE: 1933/03/06 89 y.o.  Admit date: 05/15/2023 Discharge date: 05/18/2023  Admission Diagnoses:  Discharge Diagnoses:  Principal Problem:   Hyponatremia Active Problems:   SIADH (syndrome of inappropriate ADH production) (HCC)   Lower limb ischemia   Volume depletion.   Discharged Condition: stable  Hospital Course: Patient is an 89 year old male with past medical history significant for pulmonary embolism, NSTEMI, COPD, hypertension, chronic hyponatremia secondary to SIADH.  Patient was admitted with worsening hyponatremia, with associated weakness.  On presentation, the patient sodium was 122.  Hyponatremia was thought to be secondary to combined volume depletion and SIADH.  Patient was gently hydrated.  His sodium chloride tablets were continued.  Sodium rose to 126 prior to discharge.  Continue to monitor sodium level closely.  As always an option of increasing sodium chloride dose.  Patient is back to his baseline and eager to be discharged back.   Hyponatremia: -Likely secondary to combined volume depletion and SIADH. -On presentation, sodium was 119. -Workup done revealed urine sodium was 70, serum osmolality of 265 and urine osmolality of 316. -Patient was gently hydrated.  Sodium chloride tablets were continued. -Sodium levels were monitored during the hospital stay. -Last lab work revealed sodium of 126.  Weakness is resolved.  Patient is eager to be discharged.   Abd Bloating -Abdominal x-ray was nonrevealing. -Resolved.   Lower limb ischemia Cont eliquis, seen by vascular in the past -Follow-up with vascular surgery team on discharge.   Severe GERD - PPI   COPD -As needed bronchodilators  Consults: None  Significant Diagnostic Studies:  On presentation, however sodium was 122.  Discharge Exam: Blood pressure (!) 90/42, pulse 63, temperature 98.5 F (36.9 C), resp. rate 18, height  5\' 11"  (1.803 m), weight 66.8 kg, SpO2 100 %.   Disposition: Discharge disposition: 01-Home or Self Care       Discharge Instructions     Diet - low sodium heart healthy   Complete by: As directed    Increase activity slowly   Complete by: As directed    Patient to continue PT/OT at [facility].      Allergies as of 05/18/2023       Reactions   Brilinta [ticagrelor] Other (See Comments)   GI Hemorrhage   Coffea Arabica    DECAF coffee causes vision changes   Tetanus Toxoids Other (See Comments)   "Horse serum only" Unknown reaction Documented on MAR   Zocor [simvastatin] Other (See Comments)   Myalgias    Spiriva Respimat [tiotropium Bromide Monohydrate] Other (See Comments)   Urinary retention    Niacin Other (See Comments)   Flushing         Medication List     STOP taking these medications    bisacodyl 10 MG suppository Commonly known as: DULCOLAX   furosemide 20 MG tablet Commonly known as: LASIX   guaiFENesin 600 MG 12 hr tablet Commonly known as: MUCINEX   Lutein 10 MG Tabs   nystatin powder Generic drug: nystatin   psyllium 58.6 % powder Commonly known as: METAMUCIL   RELAXMAX PO   Risa-Bid Probiotic Tabs   senna 8.6 MG Tabs tablet Commonly known as: SENOKOT   zinc oxide 20 % ointment       TAKE these medications    acetaminophen 500 MG tablet Commonly known as: TYLENOL Take 1,000 mg by mouth every 8 (eight) hours as needed for fever.   albuterol 108 (  90 Base) MCG/ACT inhaler Commonly known as: VENTOLIN HFA Inhale 2 puffs into the lungs every 6 (six) hours as needed for wheezing or shortness of breath.   apixaban 5 MG Tabs tablet Commonly known as: ELIQUIS Take 1 tablet (5 mg total) by mouth 2 (two) times daily.   ascorbic acid 500 MG tablet Commonly known as: VITAMIN C Take 500 mg by mouth daily.   aspirin EC 81 MG tablet Take 81 mg by mouth daily.   atorvastatin 40 MG tablet Commonly known as:  LIPITOR Take 0.5 tablets (20 mg total) by mouth daily.   Calcium 500 +D 500-10 MG-MCG Tabs Generic drug: Calcium Carb-Cholecalciferol Take 1 tablet by mouth daily.   cholecalciferol 25 MCG (1000 UNIT) tablet Commonly known as: VITAMIN D3 Take 1,000 Units by mouth daily.   docusate sodium 100 MG capsule Commonly known as: COLACE Take 100 mg by mouth 2 (two) times daily.   ferrous sulfate 325 (65 FE) MG EC tablet Take 325 mg by mouth every Monday, Wednesday, and Friday.   gabapentin 300 MG capsule Commonly known as: NEURONTIN Take 1 capsule (300 mg total) by mouth 2 (two) times daily.   ipratropium-albuterol 0.5-2.5 (3) MG/3ML Soln Commonly known as: DUONEB Take 3 mLs by nebulization every 6 (six) hours as needed (for shortness of breath and wheezing).   latanoprost 0.005 % ophthalmic solution Commonly known as: XALATAN Place 1 drop into both eyes at bedtime.   lisinopril 5 MG tablet Commonly known as: ZESTRIL Take 1 tablet (5 mg total) by mouth daily. Start taking on: May 19, 2023 What changed:  medication strength when to take this additional instructions   multivitamin tablet Take 1 tablet by mouth daily. Multivitamin + Folic acid 400 mcg.   pantoprazole 40 MG tablet Commonly known as: PROTONIX Take 1 tablet (40 mg total) by mouth 2 (two) times daily before a meal. What changed: when to take this   polyethylene glycol 17 g packet Commonly known as: MIRALAX / GLYCOLAX Take 17 g by mouth daily as needed for mild constipation.   Refresh Tears 0.5 % Soln Generic drug: carboxymethylcellulose Place 1 drop into both eyes 4 (four) times daily.   senna-docusate 8.6-50 MG tablet Commonly known as: Senokot-S Take 1 tablet by mouth at bedtime as needed for moderate constipation.   Simbrinza 1-0.2 % Susp Generic drug: Brinzolamide-Brimonidine Place 1 drop into both eyes 2 (two) times daily.   sodium chloride 1 g tablet Take 1 tablet (1 g total) by mouth 3 (three)  times daily with meals.   tamsulosin 0.4 MG Caps capsule Commonly known as: FLOMAX Take 2 capsules (0.8 mg total) by mouth at bedtime.   traZODone 50 MG tablet Commonly known as: DESYREL Take 0.5 tablets (25 mg total) by mouth at bedtime.   Tudorza Pressair 400 MCG/ACT Aepb Generic drug: Aclidinium Bromide Inhale 1 puff into the lungs in the morning and at bedtime.   Wixela Inhub 250-50 MCG/ACT Aepb Generic drug: fluticasone-salmeterol Inhale 1 puff into the lungs in the morning and at bedtime.        Follow-up Information     [facility] Follow up.   Why: Call to schedule first home PT/OT visit. Contact information: [address], [place] [address] [phone]                 Time spent: 35 minutes.  SignedBarnetta Chapel 05/18/2023, 3:02 PM

## 2023-05-18 NOTE — Progress Notes (Addendum)
DISCHARGE NOTE SNF [facility] to be discharged Skilled nursing facility per MD order.Discharge instructions called to [facility] to [employer] LPN. Diagnosis, treatment, medications, and follow up appointments discussed in detail.  Skin clean, dry and intact without evidence of skin break down, no evidence of skin tears noted. IV catheter discontinued intact. Site without signs and symptoms of complications. Dressing and pressure applied. Pt denies pain at the site currently. No complaints noted.   Tresa Endo, RN

## 2023-05-18 NOTE — Progress Notes (Signed)
Occupational Therapy Treatment Patient Details Name: Marcus Martin MRN: [id number] DOB: 10/10/1933 Today's Date: 05/18/2023   History of present illness 89 y/o M admitted to [facility] on 6/11 for hyponatremia. PMHx: PE, NSTEMI, COPD, HTN, chronic hyponatremia with history of idiopathic SIADH, LLE ischemia   OT comments  Pt supine in bed with HOB elevated upon OT arrival. Pt agreeable to participation in skilled OT session. Pt reported feeling "more myself" this session and demonstrated increased activity tolerance and improved standing balance during functional tasks. Pt particpated well and is making progress toward OT goals. OT instructed pt in techniques for increased safety and independence with ADLs and funcitonal transfers/mobility. Pt now demonstrates ability to complete UB ADLs in sitting with Mod I to Set up, LB ADLs with Min guard to Min assist, and functional transfer/mobility with a RW with Supervision to Min guard assist. Pt will benefit from continued acute skilled OT services to address deficits and increase independence and safety with ADLs and functional transfers/mobility. Discharge plan updated this day. Post acute discharge, pt will benefit from continued skilled OT services provided by rehab services at [facility].    Recommendations for follow up therapy are one component of a multi-disciplinary discharge planning process, led by the attending physician.  Recommendations may be updated based on patient status, additional functional criteria and insurance authorization.    Assistance Recommended at Discharge Intermittent Supervision/Assistance  Patient can return home with the following  A little help with walking and/or transfers;A little help with bathing/dressing/bathroom;Assistance with cooking/housework;Direct supervision/assist for medications management;Direct supervision/assist for financial management   Equipment Recommendations  None recommended by OT     Recommendations for Other Services      Precautions / Restrictions Precautions Precautions: Fall Restrictions Weight Bearing Restrictions: No       Mobility Bed Mobility Overal bed mobility: Needs Assistance Bed Mobility: Supine to Sit, Sit to Supine, Rolling Rolling: Independent   Supine to sit: Supervision, HOB elevated Sit to supine: Supervision, HOB elevated        Transfers Overall transfer level: Needs assistance Equipment used: Rolling walker (2 wheels) Transfers: Sit to/from Stand, Bed to chair/wheelchair/BSC Sit to Stand: Supervision (with cues for hand placement)     Step pivot transfers: Min guard           Balance Overall balance assessment: Needs assistance Sitting-balance support: No upper extremity supported, Feet supported Sitting balance-Leahy Scale: Good     Standing balance support: Single extremity supported, During functional activity, Reliant on assistive device for balance Standing balance-Leahy Scale: Fair                             ADL either performed or assessed with clinical judgement   ADL Overall ADL's : Needs assistance/impaired Eating/Feeding: Modified independent   Grooming: Min guard;Standing           Upper Body Dressing : Set up;Sitting   Lower Body Dressing: Minimal assistance;Sit to/from stand   Toilet Transfer: Min guard;Regular Toilet;Grab bars;Rolling walker (2 wheels)   Toileting- Architect and Hygiene: Min guard;Sit to/from stand       Functional mobility during ADLs: Min guard;Rolling walker (2 wheels)      Extremity/Trunk Assessment Upper Extremity Assessment Upper Extremity Assessment: Generalized weakness   Lower Extremity Assessment Lower Extremity Assessment: Defer to PT evaluation        Vision       Perception     Praxis  Cognition Arousal/Alertness: Awake/alert Behavior During Therapy: WFL for tasks assessed/performed Overall Cognitive Status: No  family/caregiver present to determine baseline cognitive functioning                                 General Comments: AAOx4 with pt demonstrating ability presenting with no praxis deficits noted. Pt presents with impaired short term memory and mildly impaired safety awareness. However, this appears to be pt's baseline. No family/caregiver present this day to confirm baseline cognition.        Exercises      Shoulder Instructions       General Comments VSS on RA throughout session. Pt with improve activity tolerance this session.    Pertinent Vitals/ Pain       Pain Assessment Pain Assessment: No/denies pain  Home Living                                          Prior Functioning/Environment              Frequency  Min 2X/week        Progress Toward Goals  OT Goals(current goals can now be found in the care plan section)  Progress towards OT goals: Progressing toward goals  Acute Rehab OT Goals Patient Stated Goal: To return home  Plan Discharge plan needs to be updated    Co-evaluation                 AM-PAC OT "6 Clicks" Daily Activity     Outcome Measure   Help from another person eating meals?: None Help from another person taking care of personal grooming?: A Little Help from another person toileting, which includes using toliet, bedpan, or urinal?: A Little Help from another person bathing (including washing, rinsing, drying)?: A Little Help from another person to put on and taking off regular upper body clothing?: None Help from another person to put on and taking off regular lower body clothing?: A Little 6 Click Score: 20    End of Session Equipment Utilized During Treatment: Gait belt;Rolling walker (2 wheels)  OT Visit Diagnosis: Unsteadiness on feet (R26.81);Muscle weakness (generalized) (M62.81)   Activity Tolerance Patient tolerated treatment well   Patient Left in bed;with call bell/phone within  reach;with bed alarm set   Nurse Communication Mobility status        Time: 1422-1440 OT Time Calculation (min): 18 min  Charges: OT General Charges $OT Visit: 1 Visit OT Treatments $Self Care/Home Management : 8-22 mins  Adrain Nesbit "Orson Eva., OTR/L, MA Acute Rehab [phone]   Lendon Colonel 05/18/2023, 3:20 PM

## 2023-05-18 NOTE — TOC Transition Note (Signed)
Transition of Care [facility]) - CM/SW Discharge Note   Patient Details  Name: Marcus Martin MRN: [id number] Date of Birth: [birth year]  Transition of Care [facility]) CM/SW Contact:  Tom-Johnson, Hershal Coria, RN Phone Number: 05/18/2023, 3:06 PM   Clinical Narrative:     Patient is scheduled for discharge today.  Readmission Risk Assessment done. Outpatient referral and discharge instructions on AVS. PTAR scheduled to transport at discharge.  No further TOC needs noted.        Final next level of care: Assisted Living Barriers to Discharge: Barriers Resolved   Patient Goals and CMS Choice CMS Medicare.gov Compare Post Acute Care list provided to:: Patient Choice offered to / list presented to : Patient  Discharge Placement                  Patient to be transferred to facility by: PTAR Name of family member notified: Daren Guereca    Discharge Plan and Services Additional resources added to the After Visit Summary for                  DME Arranged: N/A DME Agency: NA       HH Arranged: PT, OT HH Agency: Other - See comment [employer]eronautical [employer] [facility]) Date HH Agency Contacted: 05/18/23 Time HH Agency Contacted: 1020 Representative spoke with at [facility] Agency: Meryle Ready  Social Determinants of Health (SDOH) Interventions SDOH Screenings   Food Insecurity: No Food Insecurity (05/15/2023)  Housing: Low Risk  (05/15/2023)  Transportation Needs: No Transportation Needs (05/15/2023)  Utilities: Not At Risk (05/15/2023)  Tobacco Use: Medium Risk (05/15/2023)     Readmission Risk Interventions     No data to display

## 2023-05-18 NOTE — TOC Progression Note (Signed)
Transition of Care [facility]) - Progression Note    Patient Details  Name: Marcus Martin MRN: [id number] Date of Birth: 22-May-1933  Transition of Care [facility]) CM/SW Contact  Tom-Johnson, Hershal Coria, RN Phone Number: 05/18/2023, 10:22 AM  Clinical Narrative:     CM spoke with patient about Home Health recommendations. Patient states there is a Physical Therapy services at the ALF he is from and would like to use their services.  CM called and spoke with Meryle Ready [phone]) and referral accepted. Meryle Ready states she is able to access order and D/C summary from Epic. Order placed and info on AVS.  CM will continue to follow as patient progresses with care towards discharge.       Expected Discharge Plan and Services                                               Social Determinants of Health (SDOH) Interventions SDOH Screenings   Food Insecurity: No Food Insecurity (05/15/2023)  Housing: Low Risk  (05/15/2023)  Transportation Needs: No Transportation Needs (05/15/2023)  Utilities: Not At Risk (05/15/2023)  Tobacco Use: Medium Risk (05/15/2023)    Readmission Risk Interventions     No data to display

## 2023-05-18 NOTE — Progress Notes (Signed)
Mobility Specialist Progress Note   05/18/23 1112  Mobility  Activity Ambulated with assistance in hallway  Level of Assistance Minimal assist, patient does 75% or more  Assistive Device Four wheel walker (Rollator)  Distance Ambulated (ft) 210 ft  Activity Response Tolerated well  Mobility Referral Yes  $Mobility charge 1 Mobility  Mobility Specialist Start Time (ACUTE ONLY) 1050  Mobility Specialist Stop Time (ACUTE ONLY) 1110  Mobility Specialist Time Calculation (min) (ACUTE ONLY) 20 min   Received pt in bed c/o RLE but agreeable to mobility. MinG for bed mobility and MinA to stand d/t general weakness. Pt requiring minimum education for use of Rollator prior to session. Pt having steady gait during hallway ambulation but c/o increased pain as distance progressed. Pt required no seated break and able to make it back to room w/o fault. Returned to bed w/ call bell in reach and bed alarm on.  Frederico Hamman Mobility Specialist Please contact via SecureChat or  Rehab office at [phone]

## 2023-05-19 DIAGNOSIS — S12000A Unspecified displaced fracture of first cervical vertebra, initial encounter for closed fracture: Secondary | ICD-10-CM | POA: Diagnosis not present

## 2023-05-19 DIAGNOSIS — S12000D Unspecified displaced fracture of first cervical vertebra, subsequent encounter for fracture with routine healing: Secondary | ICD-10-CM | POA: Diagnosis not present

## 2023-05-19 DIAGNOSIS — Z79899 Other long term (current) drug therapy: Secondary | ICD-10-CM | POA: Diagnosis not present

## 2023-05-19 DIAGNOSIS — R1312 Dysphagia, oropharyngeal phase: Secondary | ICD-10-CM | POA: Diagnosis not present

## 2023-05-19 DIAGNOSIS — K219 Gastro-esophageal reflux disease without esophagitis: Secondary | ICD-10-CM | POA: Diagnosis not present

## 2023-05-19 DIAGNOSIS — M25561 Pain in right knee: Secondary | ICD-10-CM | POA: Diagnosis not present

## 2023-05-19 DIAGNOSIS — D649 Anemia, unspecified: Secondary | ICD-10-CM | POA: Diagnosis not present

## 2023-05-19 DIAGNOSIS — S12001A Unspecified nondisplaced fracture of first cervical vertebra, initial encounter for closed fracture: Secondary | ICD-10-CM | POA: Diagnosis not present

## 2023-05-19 DIAGNOSIS — I2510 Atherosclerotic heart disease of native coronary artery without angina pectoris: Secondary | ICD-10-CM | POA: Diagnosis not present

## 2023-05-19 DIAGNOSIS — Z743 Need for continuous supervision: Secondary | ICD-10-CM | POA: Diagnosis not present

## 2023-05-19 DIAGNOSIS — I959 Hypotension, unspecified: Secondary | ICD-10-CM | POA: Diagnosis not present

## 2023-05-19 DIAGNOSIS — M6281 Muscle weakness (generalized): Secondary | ICD-10-CM | POA: Diagnosis not present

## 2023-05-19 DIAGNOSIS — M62262 Nontraumatic ischemic infarction of muscle, left lower leg: Secondary | ICD-10-CM | POA: Diagnosis not present

## 2023-05-19 DIAGNOSIS — R2681 Unsteadiness on feet: Secondary | ICD-10-CM | POA: Diagnosis not present

## 2023-05-19 DIAGNOSIS — Z7901 Long term (current) use of anticoagulants: Secondary | ICD-10-CM | POA: Diagnosis not present

## 2023-05-19 DIAGNOSIS — Z86711 Personal history of pulmonary embolism: Secondary | ICD-10-CM | POA: Diagnosis not present

## 2023-05-19 DIAGNOSIS — H93293 Other abnormal auditory perceptions, bilateral: Secondary | ICD-10-CM | POA: Diagnosis not present

## 2023-05-19 DIAGNOSIS — Z87891 Personal history of nicotine dependence: Secondary | ICD-10-CM | POA: Diagnosis not present

## 2023-05-19 DIAGNOSIS — R600 Localized edema: Secondary | ICD-10-CM | POA: Diagnosis not present

## 2023-05-19 DIAGNOSIS — Z66 Do not resuscitate: Secondary | ICD-10-CM | POA: Diagnosis not present

## 2023-05-19 DIAGNOSIS — R2689 Other abnormalities of gait and mobility: Secondary | ICD-10-CM | POA: Diagnosis not present

## 2023-05-19 DIAGNOSIS — I252 Old myocardial infarction: Secondary | ICD-10-CM | POA: Diagnosis not present

## 2023-05-19 DIAGNOSIS — J449 Chronic obstructive pulmonary disease, unspecified: Secondary | ICD-10-CM | POA: Diagnosis not present

## 2023-05-19 DIAGNOSIS — M5030 Other cervical disc degeneration, unspecified cervical region: Secondary | ICD-10-CM | POA: Diagnosis not present

## 2023-05-19 DIAGNOSIS — I739 Peripheral vascular disease, unspecified: Secondary | ICD-10-CM | POA: Diagnosis not present

## 2023-05-19 DIAGNOSIS — E222 Syndrome of inappropriate secretion of antidiuretic hormone: Secondary | ICD-10-CM | POA: Diagnosis not present

## 2023-05-19 DIAGNOSIS — S5011XA Contusion of right forearm, initial encounter: Secondary | ICD-10-CM | POA: Diagnosis not present

## 2023-05-19 DIAGNOSIS — H9193 Unspecified hearing loss, bilateral: Secondary | ICD-10-CM | POA: Diagnosis not present

## 2023-05-19 DIAGNOSIS — F03A0 Unspecified dementia, mild, without behavioral disturbance, psychotic disturbance, mood disturbance, and anxiety: Secondary | ICD-10-CM | POA: Diagnosis not present

## 2023-05-19 DIAGNOSIS — Z955 Presence of coronary angioplasty implant and graft: Secondary | ICD-10-CM | POA: Diagnosis not present

## 2023-05-19 DIAGNOSIS — I998 Other disorder of circulatory system: Secondary | ICD-10-CM | POA: Diagnosis not present

## 2023-05-19 DIAGNOSIS — I10 Essential (primary) hypertension: Secondary | ICD-10-CM | POA: Diagnosis not present

## 2023-05-19 DIAGNOSIS — Z86718 Personal history of other venous thrombosis and embolism: Secondary | ICD-10-CM | POA: Diagnosis not present

## 2023-05-19 DIAGNOSIS — E869 Volume depletion, unspecified: Secondary | ICD-10-CM | POA: Diagnosis not present

## 2023-05-19 DIAGNOSIS — M79651 Pain in right thigh: Secondary | ICD-10-CM | POA: Diagnosis not present

## 2023-05-19 DIAGNOSIS — S12120A Other displaced dens fracture, initial encounter for closed fracture: Secondary | ICD-10-CM | POA: Diagnosis not present

## 2023-05-19 DIAGNOSIS — K5900 Constipation, unspecified: Secondary | ICD-10-CM | POA: Diagnosis not present

## 2023-05-19 DIAGNOSIS — Z85828 Personal history of other malignant neoplasm of skin: Secondary | ICD-10-CM | POA: Diagnosis not present

## 2023-05-22 DIAGNOSIS — G629 Polyneuropathy, unspecified: Secondary | ICD-10-CM | POA: Diagnosis not present

## 2023-05-22 DIAGNOSIS — Z955 Presence of coronary angioplasty implant and graft: Secondary | ICD-10-CM | POA: Diagnosis not present

## 2023-05-22 DIAGNOSIS — S59912A Unspecified injury of left forearm, initial encounter: Secondary | ICD-10-CM | POA: Diagnosis present

## 2023-05-22 DIAGNOSIS — Z85828 Personal history of other malignant neoplasm of skin: Secondary | ICD-10-CM | POA: Diagnosis not present

## 2023-05-22 DIAGNOSIS — R2689 Other abnormalities of gait and mobility: Secondary | ICD-10-CM | POA: Diagnosis not present

## 2023-05-22 DIAGNOSIS — D649 Anemia, unspecified: Secondary | ICD-10-CM | POA: Diagnosis not present

## 2023-05-22 DIAGNOSIS — I252 Old myocardial infarction: Secondary | ICD-10-CM | POA: Diagnosis not present

## 2023-05-22 DIAGNOSIS — E871 Hypo-osmolality and hyponatremia: Secondary | ICD-10-CM | POA: Diagnosis not present

## 2023-05-22 DIAGNOSIS — Z66 Do not resuscitate: Secondary | ICD-10-CM | POA: Diagnosis not present

## 2023-05-22 DIAGNOSIS — S61411A Laceration without foreign body of right hand, initial encounter: Secondary | ICD-10-CM | POA: Diagnosis not present

## 2023-05-22 DIAGNOSIS — E785 Hyperlipidemia, unspecified: Secondary | ICD-10-CM | POA: Diagnosis not present

## 2023-05-22 DIAGNOSIS — G4700 Insomnia, unspecified: Secondary | ICD-10-CM | POA: Diagnosis not present

## 2023-05-22 DIAGNOSIS — R58 Hemorrhage, not elsewhere classified: Secondary | ICD-10-CM | POA: Diagnosis not present

## 2023-05-22 DIAGNOSIS — M62262 Nontraumatic ischemic infarction of muscle, left lower leg: Secondary | ICD-10-CM | POA: Diagnosis not present

## 2023-05-22 DIAGNOSIS — M5030 Other cervical disc degeneration, unspecified cervical region: Secondary | ICD-10-CM | POA: Diagnosis not present

## 2023-05-22 DIAGNOSIS — R296 Repeated falls: Secondary | ICD-10-CM | POA: Diagnosis not present

## 2023-05-22 DIAGNOSIS — S51812A Laceration without foreign body of left forearm, initial encounter: Secondary | ICD-10-CM | POA: Diagnosis not present

## 2023-05-22 DIAGNOSIS — Y92121 Bathroom in nursing home as the place of occurrence of the external cause: Secondary | ICD-10-CM | POA: Diagnosis not present

## 2023-05-22 DIAGNOSIS — G4733 Obstructive sleep apnea (adult) (pediatric): Secondary | ICD-10-CM | POA: Diagnosis not present

## 2023-05-22 DIAGNOSIS — Z87891 Personal history of nicotine dependence: Secondary | ICD-10-CM | POA: Diagnosis not present

## 2023-05-22 DIAGNOSIS — S5011XA Contusion of right forearm, initial encounter: Secondary | ICD-10-CM | POA: Diagnosis not present

## 2023-05-22 DIAGNOSIS — K5900 Constipation, unspecified: Secondary | ICD-10-CM | POA: Diagnosis not present

## 2023-05-22 DIAGNOSIS — M25561 Pain in right knee: Secondary | ICD-10-CM | POA: Diagnosis not present

## 2023-05-22 DIAGNOSIS — R1312 Dysphagia, oropharyngeal phase: Secondary | ICD-10-CM | POA: Diagnosis not present

## 2023-05-22 DIAGNOSIS — I959 Hypotension, unspecified: Secondary | ICD-10-CM | POA: Diagnosis not present

## 2023-05-22 DIAGNOSIS — L853 Xerosis cutis: Secondary | ICD-10-CM | POA: Diagnosis not present

## 2023-05-22 DIAGNOSIS — R2681 Unsteadiness on feet: Secondary | ICD-10-CM | POA: Diagnosis not present

## 2023-05-22 DIAGNOSIS — R600 Localized edema: Secondary | ICD-10-CM | POA: Diagnosis not present

## 2023-05-22 DIAGNOSIS — Z86711 Personal history of pulmonary embolism: Secondary | ICD-10-CM | POA: Diagnosis not present

## 2023-05-22 DIAGNOSIS — W1839XA Other fall on same level, initial encounter: Secondary | ICD-10-CM | POA: Diagnosis not present

## 2023-05-22 DIAGNOSIS — J449 Chronic obstructive pulmonary disease, unspecified: Secondary | ICD-10-CM | POA: Diagnosis not present

## 2023-05-22 DIAGNOSIS — H9193 Unspecified hearing loss, bilateral: Secondary | ICD-10-CM | POA: Diagnosis not present

## 2023-05-22 DIAGNOSIS — S12000D Unspecified displaced fracture of first cervical vertebra, subsequent encounter for fracture with routine healing: Secondary | ICD-10-CM | POA: Diagnosis not present

## 2023-05-22 DIAGNOSIS — B351 Tinea unguium: Secondary | ICD-10-CM | POA: Diagnosis not present

## 2023-05-22 DIAGNOSIS — E869 Volume depletion, unspecified: Secondary | ICD-10-CM | POA: Diagnosis not present

## 2023-05-22 DIAGNOSIS — Z743 Need for continuous supervision: Secondary | ICD-10-CM | POA: Diagnosis not present

## 2023-05-22 DIAGNOSIS — Z7901 Long term (current) use of anticoagulants: Secondary | ICD-10-CM | POA: Diagnosis not present

## 2023-05-22 DIAGNOSIS — H93293 Other abnormal auditory perceptions, bilateral: Secondary | ICD-10-CM | POA: Diagnosis not present

## 2023-05-22 DIAGNOSIS — I2510 Atherosclerotic heart disease of native coronary artery without angina pectoris: Secondary | ICD-10-CM | POA: Diagnosis not present

## 2023-05-22 DIAGNOSIS — R5381 Other malaise: Secondary | ICD-10-CM | POA: Diagnosis not present

## 2023-05-22 DIAGNOSIS — K219 Gastro-esophageal reflux disease without esophagitis: Secondary | ICD-10-CM | POA: Diagnosis not present

## 2023-05-22 DIAGNOSIS — S51802A Unspecified open wound of left forearm, initial encounter: Secondary | ICD-10-CM | POA: Diagnosis not present

## 2023-05-22 DIAGNOSIS — S065XAA Traumatic subdural hemorrhage with loss of consciousness status unknown, initial encounter: Secondary | ICD-10-CM | POA: Diagnosis not present

## 2023-05-22 DIAGNOSIS — M79651 Pain in right thigh: Secondary | ICD-10-CM | POA: Diagnosis not present

## 2023-05-22 DIAGNOSIS — Z79899 Other long term (current) drug therapy: Secondary | ICD-10-CM | POA: Diagnosis not present

## 2023-05-22 DIAGNOSIS — S41101A Unspecified open wound of right upper arm, initial encounter: Secondary | ICD-10-CM | POA: Diagnosis not present

## 2023-05-22 DIAGNOSIS — I10 Essential (primary) hypertension: Secondary | ICD-10-CM | POA: Diagnosis not present

## 2023-05-22 DIAGNOSIS — N189 Chronic kidney disease, unspecified: Secondary | ICD-10-CM | POA: Diagnosis not present

## 2023-05-22 DIAGNOSIS — Z86718 Personal history of other venous thrombosis and embolism: Secondary | ICD-10-CM | POA: Diagnosis not present

## 2023-05-22 DIAGNOSIS — I7091 Generalized atherosclerosis: Secondary | ICD-10-CM | POA: Diagnosis not present

## 2023-05-22 DIAGNOSIS — K2100 Gastro-esophageal reflux disease with esophagitis, without bleeding: Secondary | ICD-10-CM | POA: Diagnosis not present

## 2023-05-22 DIAGNOSIS — S51811A Laceration without foreign body of right forearm, initial encounter: Secondary | ICD-10-CM | POA: Diagnosis not present

## 2023-05-22 DIAGNOSIS — S41001A Unspecified open wound of right shoulder, initial encounter: Secondary | ICD-10-CM | POA: Diagnosis not present

## 2023-05-22 DIAGNOSIS — S51801A Unspecified open wound of right forearm, initial encounter: Secondary | ICD-10-CM | POA: Diagnosis not present

## 2023-05-22 DIAGNOSIS — I998 Other disorder of circulatory system: Secondary | ICD-10-CM | POA: Diagnosis not present

## 2023-05-22 DIAGNOSIS — W19XXXA Unspecified fall, initial encounter: Secondary | ICD-10-CM | POA: Diagnosis not present

## 2023-05-22 DIAGNOSIS — Z7982 Long term (current) use of aspirin: Secondary | ICD-10-CM | POA: Diagnosis not present

## 2023-05-22 DIAGNOSIS — I4891 Unspecified atrial fibrillation: Secondary | ICD-10-CM | POA: Diagnosis not present

## 2023-05-22 DIAGNOSIS — S41119A Laceration without foreign body of unspecified upper arm, initial encounter: Secondary | ICD-10-CM | POA: Diagnosis not present

## 2023-05-22 DIAGNOSIS — M6281 Muscle weakness (generalized): Secondary | ICD-10-CM | POA: Diagnosis not present

## 2023-05-22 DIAGNOSIS — I739 Peripheral vascular disease, unspecified: Secondary | ICD-10-CM | POA: Diagnosis not present

## 2023-05-22 DIAGNOSIS — S12000A Unspecified displaced fracture of first cervical vertebra, initial encounter for closed fracture: Secondary | ICD-10-CM | POA: Diagnosis not present

## 2023-05-22 DIAGNOSIS — S61412A Laceration without foreign body of left hand, initial encounter: Secondary | ICD-10-CM | POA: Diagnosis not present

## 2023-05-22 DIAGNOSIS — E222 Syndrome of inappropriate secretion of antidiuretic hormone: Secondary | ICD-10-CM | POA: Diagnosis not present

## 2023-05-23 DIAGNOSIS — R600 Localized edema: Secondary | ICD-10-CM | POA: Diagnosis not present

## 2023-05-23 DIAGNOSIS — E871 Hypo-osmolality and hyponatremia: Secondary | ICD-10-CM | POA: Diagnosis not present

## 2023-05-23 DIAGNOSIS — N189 Chronic kidney disease, unspecified: Secondary | ICD-10-CM | POA: Diagnosis not present

## 2023-05-23 DIAGNOSIS — E222 Syndrome of inappropriate secretion of antidiuretic hormone: Secondary | ICD-10-CM | POA: Diagnosis not present

## 2023-05-23 DIAGNOSIS — R5381 Other malaise: Secondary | ICD-10-CM | POA: Diagnosis not present

## 2023-05-23 DIAGNOSIS — S12000A Unspecified displaced fracture of first cervical vertebra, initial encounter for closed fracture: Secondary | ICD-10-CM | POA: Diagnosis not present

## 2023-05-23 DIAGNOSIS — G4733 Obstructive sleep apnea (adult) (pediatric): Secondary | ICD-10-CM | POA: Diagnosis not present

## 2023-05-23 DIAGNOSIS — K2100 Gastro-esophageal reflux disease with esophagitis, without bleeding: Secondary | ICD-10-CM | POA: Diagnosis not present

## 2023-05-23 DIAGNOSIS — J449 Chronic obstructive pulmonary disease, unspecified: Secondary | ICD-10-CM | POA: Diagnosis not present

## 2023-05-23 DIAGNOSIS — D649 Anemia, unspecified: Secondary | ICD-10-CM | POA: Diagnosis not present

## 2023-05-23 DIAGNOSIS — K5900 Constipation, unspecified: Secondary | ICD-10-CM | POA: Diagnosis not present

## 2023-05-24 DIAGNOSIS — J449 Chronic obstructive pulmonary disease, unspecified: Secondary | ICD-10-CM | POA: Diagnosis not present

## 2023-05-24 DIAGNOSIS — I998 Other disorder of circulatory system: Secondary | ICD-10-CM | POA: Diagnosis not present

## 2023-05-24 DIAGNOSIS — K2100 Gastro-esophageal reflux disease with esophagitis, without bleeding: Secondary | ICD-10-CM | POA: Diagnosis not present

## 2023-05-24 DIAGNOSIS — G4733 Obstructive sleep apnea (adult) (pediatric): Secondary | ICD-10-CM | POA: Diagnosis not present

## 2023-05-24 DIAGNOSIS — E222 Syndrome of inappropriate secretion of antidiuretic hormone: Secondary | ICD-10-CM | POA: Diagnosis not present

## 2023-05-24 DIAGNOSIS — L853 Xerosis cutis: Secondary | ICD-10-CM | POA: Diagnosis not present

## 2023-05-24 DIAGNOSIS — R600 Localized edema: Secondary | ICD-10-CM | POA: Diagnosis not present

## 2023-05-24 DIAGNOSIS — G629 Polyneuropathy, unspecified: Secondary | ICD-10-CM | POA: Diagnosis not present

## 2023-05-24 DIAGNOSIS — I10 Essential (primary) hypertension: Secondary | ICD-10-CM | POA: Diagnosis not present

## 2023-05-24 DIAGNOSIS — K5900 Constipation, unspecified: Secondary | ICD-10-CM | POA: Diagnosis not present

## 2023-05-24 DIAGNOSIS — S12000A Unspecified displaced fracture of first cervical vertebra, initial encounter for closed fracture: Secondary | ICD-10-CM | POA: Diagnosis not present

## 2023-05-24 DIAGNOSIS — I4891 Unspecified atrial fibrillation: Secondary | ICD-10-CM | POA: Diagnosis not present

## 2023-05-29 ENCOUNTER — Emergency Department (HOSPITAL_COMMUNITY): Payer: No Typology Code available for payment source

## 2023-05-29 ENCOUNTER — Emergency Department (HOSPITAL_COMMUNITY)
Admission: EM | Admit: 2023-05-29 | Discharge: 2023-05-29 | Disposition: A | Discharge status: Home / Self Care | Payer: No Typology Code available for payment source | Attending: Emergency Medicine | Admitting: Emergency Medicine

## 2023-05-29 ENCOUNTER — Other Ambulatory Visit: Payer: Self-pay

## 2023-05-29 ENCOUNTER — Encounter (HOSPITAL_COMMUNITY): Payer: Self-pay

## 2023-05-29 DIAGNOSIS — S065XAA Traumatic subdural hemorrhage with loss of consciousness status unknown, initial encounter: Secondary | ICD-10-CM

## 2023-05-29 DIAGNOSIS — Y92121 Bathroom in nursing home as the place of occurrence of the external cause: Secondary | ICD-10-CM | POA: Insufficient documentation

## 2023-05-29 DIAGNOSIS — Z7982 Long term (current) use of aspirin: Secondary | ICD-10-CM | POA: Diagnosis not present

## 2023-05-29 DIAGNOSIS — I10 Essential (primary) hypertension: Secondary | ICD-10-CM | POA: Diagnosis not present

## 2023-05-29 DIAGNOSIS — S51812A Laceration without foreign body of left forearm, initial encounter: Secondary | ICD-10-CM | POA: Insufficient documentation

## 2023-05-29 DIAGNOSIS — Z743 Need for continuous supervision: Secondary | ICD-10-CM | POA: Diagnosis not present

## 2023-05-29 DIAGNOSIS — W19XXXA Unspecified fall, initial encounter: Secondary | ICD-10-CM

## 2023-05-29 DIAGNOSIS — Z7901 Long term (current) use of anticoagulants: Secondary | ICD-10-CM | POA: Diagnosis not present

## 2023-05-29 DIAGNOSIS — S59912A Unspecified injury of left forearm, initial encounter: Secondary | ICD-10-CM | POA: Diagnosis present

## 2023-05-29 DIAGNOSIS — S41119A Laceration without foreign body of unspecified upper arm, initial encounter: Secondary | ICD-10-CM | POA: Diagnosis not present

## 2023-05-29 DIAGNOSIS — S51811A Laceration without foreign body of right forearm, initial encounter: Secondary | ICD-10-CM | POA: Diagnosis not present

## 2023-05-29 DIAGNOSIS — R296 Repeated falls: Secondary | ICD-10-CM | POA: Diagnosis not present

## 2023-05-29 DIAGNOSIS — W1839XA Other fall on same level, initial encounter: Secondary | ICD-10-CM | POA: Diagnosis not present

## 2023-05-29 DIAGNOSIS — S61411A Laceration without foreign body of right hand, initial encounter: Secondary | ICD-10-CM | POA: Insufficient documentation

## 2023-05-29 DIAGNOSIS — J449 Chronic obstructive pulmonary disease, unspecified: Secondary | ICD-10-CM | POA: Diagnosis not present

## 2023-05-29 DIAGNOSIS — S129XXD Fracture of neck, unspecified, subsequent encounter: Secondary | ICD-10-CM

## 2023-05-29 DIAGNOSIS — S61412A Laceration without foreign body of left hand, initial encounter: Secondary | ICD-10-CM | POA: Insufficient documentation

## 2023-05-29 DIAGNOSIS — I959 Hypotension, unspecified: Secondary | ICD-10-CM | POA: Diagnosis not present

## 2023-05-29 DIAGNOSIS — S51819A Laceration without foreign body of unspecified forearm, initial encounter: Secondary | ICD-10-CM

## 2023-05-29 DIAGNOSIS — R58 Hemorrhage, not elsewhere classified: Secondary | ICD-10-CM | POA: Diagnosis not present

## 2023-05-29 LAB — BASIC METABOLIC PANEL
Anion gap: 12 (ref 5–15)
BUN: 15 mg/dL (ref 8–23)
CO2: 23 mmol/L (ref 22–32)
Calcium: 8.8 mg/dL — ABNORMAL LOW (ref 8.9–10.3)
Chloride: 97 mmol/L — ABNORMAL LOW (ref 98–111)
Creatinine, Ser: 0.93 mg/dL (ref 0.61–1.24)
GFR, Estimated: >60 mL/min (ref >60)
Glucose, Bld: 96 mg/dL (ref 70–99)
Potassium: 4.1 mmol/L (ref 3.5–5.1)
Sodium: 132 mmol/L — ABNORMAL LOW (ref 135–145)

## 2023-05-29 LAB — CBC WITH DIFFERENTIAL/PLATELET
Abs Immature Granulocytes: 0.03 10*3/uL (ref 0.00–0.07)
Basophils Absolute: 0.1 10*3/uL (ref 0.0–0.1)
Basophils Relative: 1 %
Eosinophils Absolute: 0.5 10*3/uL (ref 0.0–0.5)
Eosinophils Relative: 7 %
HCT: 30.2 % — ABNORMAL LOW (ref 39.0–52.0)
Hemoglobin: 10.2 g/dL — ABNORMAL LOW (ref 13.0–17.0)
Immature Granulocytes: 0 %
Lymphocytes Relative: 36 %
Lymphs Abs: 2.5 10*3/uL (ref 0.7–4.0)
MCH: 31 pg (ref 26.0–34.0)
MCHC: 33.8 g/dL (ref 30.0–36.0)
MCV: 91.8 fL (ref 80.0–100.0)
Monocytes Absolute: 0.8 10*3/uL (ref 0.1–1.0)
Monocytes Relative: 11 %
Neutro Abs: 3.1 10*3/uL (ref 1.7–7.7)
Neutrophils Relative %: 45 %
Platelets: 386 10*3/uL (ref 150–400)
RBC: 3.29 MIL/uL — ABNORMAL LOW (ref 4.22–5.81)
RDW: 13.2 % (ref 11.5–15.5)
WBC: 7 10*3/uL (ref 4.0–10.5)
nRBC: 0 % (ref 0.0–0.2)

## 2023-05-29 MED ORDER — CEPHALEXIN 500 MG PO CAPS: 500.0000 mg | ORAL_CAPSULE | Freq: Two times a day (BID) | ORAL | 0 refills | Status: DC

## 2023-05-29 NOTE — Discharge Instructions (Addendum)
Please follow-up with your primary care doctor, return to the ER if you have increasing confusion, headache, or weakness in bilateral arms.  Neurosurgery was consulted, and recommended that you hold your Eliquis for the next 48 hours.  This will help prevent your bleed from getting worse.  Additionally given that you have history of limb ischemia, he should check and make sure the patient's limbs are not getting worse, and becoming cool to the touch, or extremely painful.  The patient also has a large skin tear, I put him on Keflex prophylactically, to prevent any kind of infection.  Please evaluate wounds daily, to evaluate for worsening, and dress wounds appropriately.  Recommend wound care outpatient.

## 2023-05-29 NOTE — Consult Note (Signed)
Neurosurgery Consultation  Reason for Consult: Subdural hematoma Referring Physician: [name]  CC: Fall  HPI: This is a 89 y.o. man that presents after a fall at nursing home with a large skin tear, denies hitting his head, no new neck pain, no headaches / NV, no new radicular symptoms. He's not a good historian but he does take apixiban for prior DVT/PE as well as an episode of critical limb ischemia, from what I can gather. Also notable on chart review for recent admission 2 weeks ago for an upper cervical spine fracture. Interestingly, he denies a history of prior spine or cranial surgery but on CT it looks like he had a prior suboccipital and C1 lami.    ROS: A 14 point ROS was performed and is negative except as noted in the HPI.   PMHx:  Past Medical History:  Diagnosis Date   Bilateral hearing loss    COPD (chronic obstructive pulmonary disease) (HCC)    DVT (deep venous thrombosis) (HCC)    GERD (gastroesophageal reflux disease)    History of pulmonary embolus (PE)    HLD (hyperlipidemia)    HTN (hypertension)    Melanoma (HCC)    NSTEMI (non-ST elevated myocardial infarction) (HCC)    OSA (obstructive sleep apnea)    Peripheral neuropathy    Squamous cell skin cancer    FamHx:  Family History  Problem Relation Age of Onset   Cancer Mother    SocHx:  reports that he quit smoking about 44 years ago. His smoking use included cigarettes. He has never used smokeless tobacco. He reports that he does not currently use alcohol. He reports that he does not use drugs.  Exam: Vital signs in last 24 hours: Temp:  [98 F (36.7 C)] 98 F (36.7 C) (06/25 0537) Pulse Rate:  [70-76] 70 (06/25 0800) Resp:  [15-19] 15 (06/25 0800) BP: (139)/(85-94) 139/85 (06/25 0800) SpO2:  [97 %-98 %] 98 % (06/25 0800) Weight:  [66.8 kg] 66.8 kg (06/25 0538) General: Awake, alert, cooperative, lying in bed in NAD Head: Normocephalic and atruamatic HEENT: In rigid cervical collar Pulmonary:  breathing room air comfortably, no evidence of increased work of breathing Cardiac: RRR Abdomen: S NT ND Extremities: Warm and well perfused x4 with large R > L dressings on previously documented skin tears Neuro: Aox2, knows he lives at a facility but can't remember the address, gaze conjugate, denies diplopia, pupils equal Strength 5/5 x4, SILTx4   Assessment and Plan: 89 y.o. man s/p fall. CTH/CT C-spine personally reviewed, which show a thin L mixed density subdural, mainly hypodense and C1 ring / type 2 dens fractures with prior SOC.   -agree with collar for cervical spine fracture -for the subdural, given the density on CT in the setting of such a strong thrombophilia history, I think it's probably a best mix of risk-benefit to just hold the eliquis for 48h. Family wants him to go home and wouldn't want intervention if the intracranial hemorhage worsens, so I think it's fine for him to return to facility given that we wouldn't do anything if he did have a decline while being monitored -please call with any concerns or questions  Marcus Pierini, MD 05/29/23 8:42 AM [facility]

## 2023-05-29 NOTE — ED Triage Notes (Signed)
Pt BIB GEMS from [facility]. fell against a cabinet - has skin tears on left arm but deeper on right arm per EMS.  Right arm is bandaged.  Pt has C-Collar on but from old injury.

## 2023-05-29 NOTE — ED Provider Notes (Incomplete)
[place] EMERGENCY DEPARTMENT AT [facility] Provider Note   CSN: [id number] Arrival date & time: 05/29/23  0531     History {Add pertinent medical, surgical, social history, OB history to HPI:1} Chief Complaint  Patient presents with  . Fall    Marcus Martin is a 89 y.o. male.  89 year old male brought in by EMS from nursing facility.  Patient said that he was going to the bathroom this morning when he fell against the cabinet and slid down to the floor.  Reports skin tears to his left forearm which are new, skin tear to right forearm is from an old wound.  Is wearing a c-collar from prior neck fracture, denies hitting his head.  Patient is on Eliquis.       Home Medications Prior to Admission medications   Medication Sig Start Date End Date Taking? Authorizing Provider  acetaminophen (TYLENOL) 500 MG tablet Take 1,000 mg by mouth every 8 (eight) hours as needed for fever.    [provider]  Aclidinium Bromide (TUDORZA PRESSAIR) 400 MCG/ACT AEPB Inhale 1 puff into the lungs in the morning and at bedtime. 01/12/23   Levin Erp, MD  albuterol (VENTOLIN HFA) 108 (90 Base) MCG/ACT inhaler Inhale 2 puffs into the lungs every 6 (six) hours as needed for wheezing or shortness of breath.    [provider]  apixaban (ELIQUIS) 5 MG TABS tablet Take 1 tablet (5 mg total) by mouth 2 (two) times daily. 01/22/23   Levin Erp, MD  ascorbic acid (VITAMIN C) 500 MG tablet Take 500 mg by mouth daily.    [provider]  aspirin EC 81 MG tablet Take 81 mg by mouth daily.    [provider]  atorvastatin (LIPITOR) 40 MG tablet Take 0.5 tablets (20 mg total) by mouth daily. 01/22/23   Levin Erp, MD  Brinzolamide-Brimonidine [facility]) 1-0.2 % SUSP Place 1 drop into both eyes 2 (two) times daily.    [provider]  Calcium Carb-Cholecalciferol (CALCIUM 500 +D) 500-10 MG-MCG TABS Take 1 tablet by mouth daily.    [provider]  carboxymethylcellulose (REFRESH TEARS) 0.5 % SOLN Place 1 drop into both eyes 4 (four) times daily.    [provider]  cholecalciferol 25 MCG (1000 UT) tablet Take 1,000 Units by mouth daily.    [provider]  docusate sodium (COLACE) 100 MG capsule Take 100 mg by mouth 2 (two) times daily.    [provider]  ferrous sulfate 325 (65 FE) MG EC tablet Take 325 mg by mouth every Monday, Wednesday, and Friday.    [provider]  fluticasone-salmeterol (WIXELA INHUB) 250-50 MCG/ACT AEPB Inhale 1 puff into the lungs in the morning and at bedtime.    [provider]  gabapentin (NEURONTIN) 300 MG capsule Take 1 capsule (300 mg total) by mouth 2 (two) times daily. 01/12/23   Levin Erp, MD  ipratropium-albuterol (DUONEB) 0.5-2.5 (3) MG/3ML SOLN Take 3 mLs by nebulization every 6 (six) hours as needed (for shortness of breath and wheezing).    [provider]  latanoprost (XALATAN) 0.005 % ophthalmic solution Place 1 drop into both eyes at bedtime.    [provider]  lisinopril (ZESTRIL) 5 MG tablet Take 1 tablet (5 mg total) by mouth daily. 05/19/23 06/18/23  Barnetta Chapel, MD  Multiple Vitamin (MULTIVITAMIN) tablet Take 1 tablet by mouth daily. Multivitamin + Folic acid 400 mcg.    [provider]  pantoprazole (PROTONIX)  40 MG tablet Take 1 tablet (40 mg total) by mouth 2 (two) times daily before a meal. Patient taking differently: Take 40 mg by mouth 2 (two) times daily. 11/08/21   Merrilyn Puma, MD  polyethylene glycol (MIRALAX / GLYCOLAX) 17 g packet Take 17 g by mouth daily as needed for mild constipation.    [provider]  senna-docusate (SENOKOT-S) 8.6-50 MG tablet Take 1 tablet by mouth at bedtime as needed for moderate constipation. 05/18/23 06/17/23  Berton Mount I, MD  sodium chloride 1 g tablet Take 1 tablet (1 g total) by mouth 3 (three) times daily with meals. 11/08/21   Merrilyn Puma, MD  tamsulosin (FLOMAX) 0.4 MG CAPS capsule Take 2 capsules (0.8 mg total) by mouth at bedtime. 01/22/23   Levin Erp, MD  traZODone (DESYREL) 50 MG tablet Take 0.5 tablets (25 mg total) by mouth at bedtime. 01/12/23   Levin Erp, MD      Allergies    Brilinta [ticagrelor], Coffea arabica, Tetanus toxoids, Zocor [simvastatin], Spiriva respimat [tiotropium bromide monohydrate], and Niacin    Review of Systems   Review of Systems Negative except as per HPI Physical Exam Updated Vital Signs BP (!) 139/94   Pulse 75   Temp 98 F (36.7 C) (Oral)   Resp 19   Ht 5\' 11"  (1.803 m)   Wt 66.8 kg   SpO2 97%   BMI 20.54 kg/m  Physical Exam  ED Results / Procedures / Treatments   Labs (all labs ordered are listed, but only abnormal results are displayed) Labs Reviewed - No data to display  EKG None  Radiology No results found.  Procedures Procedures  {Document cardiac monitor, telemetry assessment procedure when appropriate:1}  Medications Ordered in ED Medications - No data to display  ED Course/ Medical Decision Making/ A&P   {   Click here for ABCD2, HEART and other calculatorsREFRESH Note before signing :1}                          Medical Decision Making Amount and/or Complexity of Data Reviewed Radiology: ordered.   ***  {Document critical care time when appropriate:1} {Document review of labs and clinical decision tools ie heart score, Chads2Vasc2 etc:1}  {Document your independent review of radiology images, and any outside records:1} {Document your discussion with family members, caretakers, and with consultants:1} {Document social determinants of health affecting pt's care:1} {Document your decision making why or why not admission, treatments were needed:1} Final Clinical Impression(s) / ED Diagnoses Final diagnoses:  None    Rx / DC Orders ED Discharge Orders     None

## 2023-05-29 NOTE — ED Notes (Signed)
RN called PTAR.  

## 2023-05-29 NOTE — ED Provider Notes (Signed)
[place] EMERGENCY DEPARTMENT AT [facility] Provider Note   CSN: [id number] Arrival date & time: 05/29/23  0531     History  Chief Complaint  Patient presents with   Marcus Martin is a 89 y.o. male.  89 year old male brought in by EMS from nursing facility.  Patient said that he was going to the bathroom this morning when he fell against the cabinet and slid down to the floor.  Reports skin tears to his left forearm which are new, skin tear to right forearm is from an old wound.  Is wearing a c-collar from prior neck fracture, denies hitting his head.  Patient is on Eliquis.       Home Medications Prior to Admission medications   Medication Sig Start Date End Date Taking? Authorizing Provider  acetaminophen (TYLENOL) 500 MG tablet Take 1,000 mg by mouth every 8 (eight) hours as needed for fever.    [provider]  Aclidinium Bromide (TUDORZA PRESSAIR) 400 MCG/ACT AEPB Inhale 1 puff into the lungs in the morning and at bedtime. 01/12/23   Levin Erp, MD  albuterol (VENTOLIN HFA) 108 (90 Base) MCG/ACT inhaler Inhale 2 puffs into the lungs every 6 (six) hours as needed for wheezing or shortness of breath.    [provider]  apixaban (ELIQUIS) 5 MG TABS tablet Take 1 tablet (5 mg total) by mouth 2 (two) times daily. 01/22/23   Levin Erp, MD  ascorbic acid (VITAMIN C) 500 MG tablet Take 500 mg by mouth daily.    [provider]  aspirin EC 81 MG tablet Take 81 mg by mouth daily.    [provider]  atorvastatin (LIPITOR) 40 MG tablet Take 0.5 tablets (20 mg total) by mouth daily. 01/22/23   Levin Erp, MD  Brinzolamide-Brimonidine [facility]) 1-0.2 % SUSP Place 1 drop into both eyes 2 (two) times daily.    [provider]  Calcium Carb-Cholecalciferol (CALCIUM 500 +D) 500-10 MG-MCG TABS Take 1 tablet by mouth daily.    [provider]  carboxymethylcellulose (REFRESH TEARS) 0.5 % SOLN Place 1  drop into both eyes 4 (four) times daily.    [provider]  cholecalciferol 25 MCG (1000 UT) tablet Take 1,000 Units by mouth daily.    [provider]  docusate sodium (COLACE) 100 MG capsule Take 100 mg by mouth 2 (two) times daily.    [provider]  ferrous sulfate 325 (65 FE) MG EC tablet Take 325 mg by mouth every Monday, Wednesday, and Friday.    [provider]  fluticasone-salmeterol (WIXELA INHUB) 250-50 MCG/ACT AEPB Inhale 1 puff into the lungs in the morning and at bedtime.    [provider]  gabapentin (NEURONTIN) 300 MG capsule Take 1 capsule (300 mg total) by mouth 2 (two) times daily. 01/12/23   Levin Erp, MD  ipratropium-albuterol (DUONEB) 0.5-2.5 (3) MG/3ML SOLN Take 3 mLs by nebulization every 6 (six) hours as needed (for shortness of breath and wheezing).    [provider]  latanoprost (XALATAN) 0.005 % ophthalmic solution Place 1 drop into both eyes at bedtime.    [provider]  lisinopril (ZESTRIL) 5 MG tablet Take 1 tablet (5 mg total) by mouth daily. 05/19/23 06/18/23  Barnetta Chapel, MD  Multiple Vitamin (MULTIVITAMIN) tablet Take 1 tablet by mouth daily. Multivitamin + Folic acid 400 mcg.    [provider]  pantoprazole (PROTONIX) 40 MG tablet Take 1 tablet (40 mg total)  by mouth 2 (two) times daily before a meal. Patient taking differently: Take 40 mg by mouth 2 (two) times daily. 11/08/21   Merrilyn Puma, MD  polyethylene glycol (MIRALAX / GLYCOLAX) 17 g packet Take 17 g by mouth daily as needed for mild constipation.    [provider]  senna-docusate (SENOKOT-S) 8.6-50 MG tablet Take 1 tablet by mouth at bedtime as needed for moderate constipation. 05/18/23 06/17/23  Berton Mount I, MD  sodium chloride 1 g tablet Take 1 tablet (1 g total) by mouth 3 (three) times daily with meals. 11/08/21   Merrilyn Puma, MD  tamsulosin (FLOMAX) 0.4 MG CAPS capsule Take 2 capsules (0.8 mg  total) by mouth at bedtime. 01/22/23   Levin Erp, MD  traZODone (DESYREL) 50 MG tablet Take 0.5 tablets (25 mg total) by mouth at bedtime. 01/12/23   Levin Erp, MD      Allergies    Brilinta [ticagrelor], Coffea arabica, Tetanus toxoids, Zocor [simvastatin], Spiriva respimat [tiotropium bromide monohydrate], and Niacin    Review of Systems   Review of Systems Negative except as per HPI Physical Exam Updated Vital Signs BP (!) 139/94   Pulse 75   Temp 98 F (36.7 C) (Oral)   Resp 19   Ht 5\' 11"  (1.803 m)   Wt 66.8 kg   SpO2 97%   BMI 20.54 kg/m  Physical Exam Vitals and nursing note reviewed.  Constitutional:      General: He is not in acute distress.    Appearance: He is well-developed. He is not diaphoretic.     Interventions: Cervical collar in place.  HENT:     Head: Normocephalic and atraumatic.  Eyes:     Extraocular Movements: Extraocular movements intact.     Pupils: Pupils are equal, round, and reactive to light.  Cardiovascular:     Rate and Rhythm: Normal rate and regular rhythm.     Pulses: Normal pulses.     Heart sounds: Normal heart sounds.  Pulmonary:     Effort: Pulmonary effort is normal.     Breath sounds: Normal breath sounds.  Chest:     Chest wall: No tenderness.  Abdominal:     Palpations: Abdomen is soft.     Tenderness: There is no abdominal tenderness.  Musculoskeletal:        General: Signs of injury present. No swelling, tenderness or deformity.     Right elbow: Normal.     Left elbow: Normal.     Right forearm: Laceration present. No tenderness or bony tenderness.     Left forearm: Laceration present. No tenderness or bony tenderness.     Comments: Skin tears to bilateral forearms into hands.  Skin:    General: Skin is warm and dry.     Findings: No erythema or rash.  Neurological:     Mental Status: He is alert and oriented to person, place, and time.     Sensory: No sensory deficit.     Motor: No weakness.   Psychiatric:        Behavior: Behavior normal.     ED Results / Procedures / Treatments   Labs (all labs ordered are listed, but only abnormal results are displayed) Labs Reviewed  CBC WITH DIFFERENTIAL/PLATELET  BASIC METABOLIC PANEL    EKG None  Radiology No results found.  Procedures Procedures    Medications Ordered in ED Medications - No data to display  ED Course/ Medical Decision Making/ A&P  Medical Decision Making Amount and/or Complexity of Data Reviewed Radiology: ordered.   This patient presents to the ED for concern of fall, this involves an extensive number of treatment options, and is a complaint that carries with it a high risk of complications and morbidity.  The differential diagnosis includes but not limited to intracranial injury, c-spine injury. Skin tear   Co morbidities that complicate the patient evaluation  COPD, NSTEMI, DVT, PE, hypertension, hyperlipidemia, GERD   Additional history obtained:  Additional history obtained from EMS who contributes to history as above External records from outside source obtained and reviewed including discharge summary dated 05/18/2023 where patient was admitted for hyponatremia with sodium of 122 thought to be secondary to combined volume depletion and SIADH.  Sodium improved to 126 prior to discharge.   Lab Tests:  I Ordered, and personally interpreted labs.  The pertinent results include: Pending at time of signout   Imaging Studies ordered:  I ordered imaging studies including CT head, CT c-spine  I independently visualized and interpreted imaging which showed pending at time of sign out to oncoming provider  I agree with the radiologist interpretation   Consultations Obtained:  I requested consultation with the ER attending, Dr.[name],  and discussed lab and imaging findings as well as pertinent plan - they recommend: agrees with plan of care   Problem List /  ED Course / Critical interventions / Medication management  89 year old male presents from nursing facility via EMS after fall.  Patient notes that he slid down the cabinet today resulting in skin tears to his left forearm.  He is wearing a c-collar from prior C-spine injury, denies hitting his head, loss of conscious.  Patient is on Eliquis.  He has a healing skin tear to his right forearm which was also disrupted in his fall tonight.  On chart review, he was recently discharged from the hospital 11 days ago where he was admitted for hyponatremia.  Due to concern for his weakness potential today leading to his fall, labs were obtained to further evaluate as well as imaging of head and C-spine.  Request nursing staff dress wounds, does not require closure with sutures. I have reviewed the patients home medicines and have made adjustments as needed   Social Determinants of Health:  [facility] at [facility]   Test / Admission - Considered:  Disposition pending at time of signout         Final Clinical Impression(s) / ED Diagnoses Final diagnoses:  Fall, initial encounter  Skin tear of forearm without complication, unspecified laterality, initial encounter    Rx / DC Orders ED Discharge Orders     None         Jeannie Fend, PA-C 05/29/23 0630    Wilkie Aye Mayer Masker, MD 05/30/23 [phone]

## 2023-05-29 NOTE — ED Provider Notes (Signed)
Fell at [facility] ([facility] Slid down cabinet. Skin tears to L forehand and R hand. Old skin tear open on R arm. In C-collar for old fracture. Pending head CT. Recently admitted for weakness 2/2 to hyponatremia.  CT pending, BMP.  Physical Exam  BP (!) 161/54   Pulse 66   Temp 98 F (36.7 C) (Oral)   Resp 17   Ht 5\' 11"  (1.803 m)   Wt 66.8 kg   SpO2 97%   BMI 20.54 kg/m   Physical Exam Vitals and nursing note reviewed.  Constitutional:      General: He is not in acute distress.    Appearance: He is well-developed.  HENT:     Head: Normocephalic and atraumatic.  Eyes:     Conjunctiva/sclera: Conjunctivae normal.  Neck:     Comments: C-collar in place Cardiovascular:     Rate and Rhythm: Normal rate and regular rhythm.     Heart sounds: No murmur heard. Pulmonary:     Effort: Pulmonary effort is normal. No respiratory distress.     Breath sounds: Normal breath sounds.  Abdominal:     Palpations: Abdomen is soft.     Tenderness: There is no abdominal tenderness.  Musculoskeletal:        General: No swelling.     Cervical back: Neck supple.  Skin:    General: Skin is warm.     Capillary Refill: Capillary refill takes less than 2 seconds.     Comments: Multiple abrasions on bilateral arms.  See photo for further detail. Large skin tear, with dressing applied.  Neurological:     Mental Status: He is alert.     Comments: Good strength of bilateral lower and bilateral upper extremities.  Psychiatric:        Mood and Affect: Mood normal.     Procedures  Procedures  ED Course / MDM    Medical Decision Making Patient is an 89 year old male, with a fall that occurred this AM. Evaluated by Dr. Maurice , neurosurgery, who recommends holding Eliquisx48 hours given head bleed and the resuming. Recommends f/u with PCP.  I discussed thoroughly with her his son Ebony Hail, the patient's condition, we discussed the head CT and the neck CT, and improvement of sodium.  He does have some changes in  his cervical spine, but I spoke with neurosurgery regarding this, and they recommending continuing C-collar, and do not recommend any kind of intervention.  Additionally I discussed with [name], the patient's new subdural hematoma.  On 6/15 from [facility], he did not have any evidence of a subdural.  I discussed this with Dr. Johnsie Cancel, see above.  [name] does not wish for the patient to have any kind of intervention, at this time, as the patient is a DNR.  We discussed observation, and son Ebony Hail would like the patient to be discharged back to facility as he does not plan on doing any kind of intervention if necessary, and thus it would not change management.  We discussed risk associated with this, as well as benefits, and the patient's son voiced understanding.  We also discussed risk associated with holding the Eliquis and benefits regarding this, and [name] is aware that patient should hold the Eliquis for 48 hours to prevent further bleed.  Understands the risk given the history of critical limb ischemia, DVT, and PEs.  Patient discharged to facility, will have wound care, given prophylactic Keflex given large wound.  He does not have an allergy to tetanus shots thus it was  not updated.  Facility nursing should do basic wound care, and return precautions emphasized.  Amount and/or Complexity of Data Reviewed Labs: ordered. Radiology: ordered.  Risk Prescription drug management.      Pete Pelt, [place] 05/29/23 0945    Jacalyn Lefevre, MD 05/29/23 1032

## 2023-05-30 DIAGNOSIS — D649 Anemia, unspecified: Secondary | ICD-10-CM | POA: Diagnosis not present

## 2023-05-30 DIAGNOSIS — J449 Chronic obstructive pulmonary disease, unspecified: Secondary | ICD-10-CM | POA: Diagnosis not present

## 2023-05-30 DIAGNOSIS — L853 Xerosis cutis: Secondary | ICD-10-CM | POA: Diagnosis not present

## 2023-05-30 DIAGNOSIS — E785 Hyperlipidemia, unspecified: Secondary | ICD-10-CM | POA: Diagnosis not present

## 2023-05-30 DIAGNOSIS — I10 Essential (primary) hypertension: Secondary | ICD-10-CM | POA: Diagnosis not present

## 2023-05-30 DIAGNOSIS — G629 Polyneuropathy, unspecified: Secondary | ICD-10-CM | POA: Diagnosis not present

## 2023-05-30 DIAGNOSIS — I4891 Unspecified atrial fibrillation: Secondary | ICD-10-CM | POA: Diagnosis not present

## 2023-05-30 DIAGNOSIS — S065XAA Traumatic subdural hemorrhage with loss of consciousness status unknown, initial encounter: Secondary | ICD-10-CM | POA: Diagnosis not present

## 2023-05-30 DIAGNOSIS — S12000A Unspecified displaced fracture of first cervical vertebra, initial encounter for closed fracture: Secondary | ICD-10-CM | POA: Diagnosis not present

## 2023-05-30 DIAGNOSIS — G4700 Insomnia, unspecified: Secondary | ICD-10-CM | POA: Diagnosis not present

## 2023-05-30 DIAGNOSIS — G4733 Obstructive sleep apnea (adult) (pediatric): Secondary | ICD-10-CM | POA: Diagnosis not present

## 2023-05-30 DIAGNOSIS — N189 Chronic kidney disease, unspecified: Secondary | ICD-10-CM | POA: Diagnosis not present

## 2023-06-04 DIAGNOSIS — B351 Tinea unguium: Secondary | ICD-10-CM | POA: Diagnosis not present

## 2023-06-04 DIAGNOSIS — D649 Anemia, unspecified: Secondary | ICD-10-CM | POA: Diagnosis not present

## 2023-06-04 DIAGNOSIS — I7091 Generalized atherosclerosis: Secondary | ICD-10-CM | POA: Diagnosis not present

## 2023-06-05 DIAGNOSIS — S41101A Unspecified open wound of right upper arm, initial encounter: Secondary | ICD-10-CM | POA: Diagnosis not present

## 2023-06-05 DIAGNOSIS — S41001A Unspecified open wound of right shoulder, initial encounter: Secondary | ICD-10-CM | POA: Diagnosis not present

## 2023-06-05 DIAGNOSIS — S51802A Unspecified open wound of left forearm, initial encounter: Secondary | ICD-10-CM | POA: Diagnosis not present

## 2023-06-05 DIAGNOSIS — S51801A Unspecified open wound of right forearm, initial encounter: Secondary | ICD-10-CM | POA: Diagnosis not present

## 2023-06-08 DIAGNOSIS — N189 Chronic kidney disease, unspecified: Secondary | ICD-10-CM | POA: Diagnosis not present

## 2023-06-08 DIAGNOSIS — I4891 Unspecified atrial fibrillation: Secondary | ICD-10-CM | POA: Diagnosis not present

## 2023-06-08 DIAGNOSIS — E785 Hyperlipidemia, unspecified: Secondary | ICD-10-CM | POA: Diagnosis not present

## 2023-06-08 DIAGNOSIS — J449 Chronic obstructive pulmonary disease, unspecified: Secondary | ICD-10-CM | POA: Diagnosis not present

## 2023-06-08 DIAGNOSIS — I10 Essential (primary) hypertension: Secondary | ICD-10-CM | POA: Diagnosis not present

## 2023-06-09 DIAGNOSIS — M6281 Muscle weakness (generalized): Secondary | ICD-10-CM | POA: Diagnosis not present

## 2023-06-11 DIAGNOSIS — M6281 Muscle weakness (generalized): Secondary | ICD-10-CM | POA: Diagnosis not present

## 2023-06-12 DIAGNOSIS — D649 Anemia, unspecified: Secondary | ICD-10-CM | POA: Diagnosis not present

## 2023-06-12 DIAGNOSIS — S51801A Unspecified open wound of right forearm, initial encounter: Secondary | ICD-10-CM | POA: Diagnosis not present

## 2023-06-12 DIAGNOSIS — S41101A Unspecified open wound of right upper arm, initial encounter: Secondary | ICD-10-CM | POA: Diagnosis not present

## 2023-06-12 DIAGNOSIS — S51802A Unspecified open wound of left forearm, initial encounter: Secondary | ICD-10-CM | POA: Diagnosis not present

## 2023-06-12 DIAGNOSIS — D509 Iron deficiency anemia, unspecified: Secondary | ICD-10-CM | POA: Diagnosis not present

## 2023-06-12 DIAGNOSIS — M6281 Muscle weakness (generalized): Secondary | ICD-10-CM | POA: Diagnosis not present

## 2023-06-12 DIAGNOSIS — S41001A Unspecified open wound of right shoulder, initial encounter: Secondary | ICD-10-CM | POA: Diagnosis not present

## 2023-06-13 DIAGNOSIS — M6281 Muscle weakness (generalized): Secondary | ICD-10-CM | POA: Diagnosis not present

## 2023-06-16 DIAGNOSIS — M6281 Muscle weakness (generalized): Secondary | ICD-10-CM | POA: Diagnosis not present

## 2023-06-19 DIAGNOSIS — M6281 Muscle weakness (generalized): Secondary | ICD-10-CM | POA: Diagnosis not present

## 2023-06-19 DIAGNOSIS — S51802A Unspecified open wound of left forearm, initial encounter: Secondary | ICD-10-CM | POA: Diagnosis not present

## 2023-06-19 DIAGNOSIS — S41101A Unspecified open wound of right upper arm, initial encounter: Secondary | ICD-10-CM | POA: Diagnosis not present

## 2023-06-19 DIAGNOSIS — S12112A Nondisplaced Type II dens fracture, initial encounter for closed fracture: Secondary | ICD-10-CM | POA: Diagnosis not present

## 2023-06-19 DIAGNOSIS — S51801A Unspecified open wound of right forearm, initial encounter: Secondary | ICD-10-CM | POA: Diagnosis not present

## 2023-06-19 DIAGNOSIS — S12000A Unspecified displaced fracture of first cervical vertebra, initial encounter for closed fracture: Secondary | ICD-10-CM | POA: Diagnosis not present

## 2023-06-20 DIAGNOSIS — G4700 Insomnia, unspecified: Secondary | ICD-10-CM | POA: Diagnosis not present

## 2023-06-20 DIAGNOSIS — N189 Chronic kidney disease, unspecified: Secondary | ICD-10-CM | POA: Diagnosis not present

## 2023-06-20 DIAGNOSIS — R5381 Other malaise: Secondary | ICD-10-CM | POA: Diagnosis not present

## 2023-06-20 DIAGNOSIS — I10 Essential (primary) hypertension: Secondary | ICD-10-CM | POA: Diagnosis not present

## 2023-06-20 DIAGNOSIS — G4733 Obstructive sleep apnea (adult) (pediatric): Secondary | ICD-10-CM | POA: Diagnosis not present

## 2023-06-20 DIAGNOSIS — S065XAA Traumatic subdural hemorrhage with loss of consciousness status unknown, initial encounter: Secondary | ICD-10-CM | POA: Diagnosis not present

## 2023-06-20 DIAGNOSIS — K2100 Gastro-esophageal reflux disease with esophagitis, without bleeding: Secondary | ICD-10-CM | POA: Diagnosis not present

## 2023-06-20 DIAGNOSIS — J449 Chronic obstructive pulmonary disease, unspecified: Secondary | ICD-10-CM | POA: Diagnosis not present

## 2023-06-20 DIAGNOSIS — K5900 Constipation, unspecified: Secondary | ICD-10-CM | POA: Diagnosis not present

## 2023-06-20 DIAGNOSIS — R600 Localized edema: Secondary | ICD-10-CM | POA: Diagnosis not present

## 2023-06-20 DIAGNOSIS — L853 Xerosis cutis: Secondary | ICD-10-CM | POA: Diagnosis not present

## 2023-06-21 DIAGNOSIS — M6281 Muscle weakness (generalized): Secondary | ICD-10-CM | POA: Diagnosis not present

## 2023-06-25 DIAGNOSIS — M6281 Muscle weakness (generalized): Secondary | ICD-10-CM | POA: Diagnosis not present

## 2023-06-26 DIAGNOSIS — S51801A Unspecified open wound of right forearm, initial encounter: Secondary | ICD-10-CM | POA: Diagnosis not present

## 2023-06-26 DIAGNOSIS — S51802A Unspecified open wound of left forearm, initial encounter: Secondary | ICD-10-CM | POA: Diagnosis not present

## 2023-07-03 DIAGNOSIS — S51801A Unspecified open wound of right forearm, initial encounter: Secondary | ICD-10-CM | POA: Diagnosis not present

## 2023-07-05 DIAGNOSIS — S065XAA Traumatic subdural hemorrhage with loss of consciousness status unknown, initial encounter: Secondary | ICD-10-CM | POA: Diagnosis not present

## 2023-07-05 DIAGNOSIS — G629 Polyneuropathy, unspecified: Secondary | ICD-10-CM | POA: Diagnosis not present

## 2023-07-05 DIAGNOSIS — I998 Other disorder of circulatory system: Secondary | ICD-10-CM | POA: Diagnosis not present

## 2023-07-05 DIAGNOSIS — E785 Hyperlipidemia, unspecified: Secondary | ICD-10-CM | POA: Diagnosis not present

## 2023-07-05 DIAGNOSIS — J449 Chronic obstructive pulmonary disease, unspecified: Secondary | ICD-10-CM | POA: Diagnosis not present

## 2023-07-05 DIAGNOSIS — I10 Essential (primary) hypertension: Secondary | ICD-10-CM | POA: Diagnosis not present

## 2023-07-05 DIAGNOSIS — E222 Syndrome of inappropriate secretion of antidiuretic hormone: Secondary | ICD-10-CM | POA: Diagnosis not present

## 2023-07-05 DIAGNOSIS — T148XXA Other injury of unspecified body region, initial encounter: Secondary | ICD-10-CM | POA: Diagnosis not present

## 2023-07-05 DIAGNOSIS — G4700 Insomnia, unspecified: Secondary | ICD-10-CM | POA: Diagnosis not present

## 2023-07-05 DIAGNOSIS — R600 Localized edema: Secondary | ICD-10-CM | POA: Diagnosis not present

## 2023-07-05 DIAGNOSIS — N189 Chronic kidney disease, unspecified: Secondary | ICD-10-CM | POA: Diagnosis not present

## 2023-07-05 DIAGNOSIS — K5900 Constipation, unspecified: Secondary | ICD-10-CM | POA: Diagnosis not present

## 2023-07-09 DIAGNOSIS — N189 Chronic kidney disease, unspecified: Secondary | ICD-10-CM | POA: Diagnosis not present

## 2023-07-09 DIAGNOSIS — J449 Chronic obstructive pulmonary disease, unspecified: Secondary | ICD-10-CM | POA: Diagnosis not present

## 2023-07-09 DIAGNOSIS — I4891 Unspecified atrial fibrillation: Secondary | ICD-10-CM | POA: Diagnosis not present

## 2023-07-09 DIAGNOSIS — I10 Essential (primary) hypertension: Secondary | ICD-10-CM | POA: Diagnosis not present

## 2023-07-09 DIAGNOSIS — E785 Hyperlipidemia, unspecified: Secondary | ICD-10-CM | POA: Diagnosis not present

## 2023-07-10 DIAGNOSIS — S51801A Unspecified open wound of right forearm, initial encounter: Secondary | ICD-10-CM | POA: Diagnosis not present

## 2023-07-16 DIAGNOSIS — I10 Essential (primary) hypertension: Secondary | ICD-10-CM | POA: Diagnosis not present

## 2023-07-16 DIAGNOSIS — R21 Rash and other nonspecific skin eruption: Secondary | ICD-10-CM | POA: Diagnosis not present

## 2023-07-16 DIAGNOSIS — I998 Other disorder of circulatory system: Secondary | ICD-10-CM | POA: Diagnosis not present

## 2023-07-16 DIAGNOSIS — G4733 Obstructive sleep apnea (adult) (pediatric): Secondary | ICD-10-CM | POA: Diagnosis not present

## 2023-07-16 DIAGNOSIS — E222 Syndrome of inappropriate secretion of antidiuretic hormone: Secondary | ICD-10-CM | POA: Diagnosis not present

## 2023-07-16 DIAGNOSIS — K2100 Gastro-esophageal reflux disease with esophagitis, without bleeding: Secondary | ICD-10-CM | POA: Diagnosis not present

## 2023-07-16 DIAGNOSIS — J449 Chronic obstructive pulmonary disease, unspecified: Secondary | ICD-10-CM | POA: Diagnosis not present

## 2023-07-16 DIAGNOSIS — K5900 Constipation, unspecified: Secondary | ICD-10-CM | POA: Diagnosis not present

## 2023-07-16 DIAGNOSIS — E785 Hyperlipidemia, unspecified: Secondary | ICD-10-CM | POA: Diagnosis not present

## 2023-07-16 DIAGNOSIS — R600 Localized edema: Secondary | ICD-10-CM | POA: Diagnosis not present

## 2023-07-16 DIAGNOSIS — I4891 Unspecified atrial fibrillation: Secondary | ICD-10-CM | POA: Diagnosis not present

## 2023-07-16 DIAGNOSIS — G629 Polyneuropathy, unspecified: Secondary | ICD-10-CM | POA: Diagnosis not present

## 2023-07-17 DIAGNOSIS — S51801A Unspecified open wound of right forearm, initial encounter: Secondary | ICD-10-CM | POA: Diagnosis not present

## 2023-07-19 DIAGNOSIS — I4891 Unspecified atrial fibrillation: Secondary | ICD-10-CM | POA: Diagnosis not present

## 2023-07-24 DIAGNOSIS — S51801A Unspecified open wound of right forearm, initial encounter: Secondary | ICD-10-CM | POA: Diagnosis not present

## 2023-07-30 DIAGNOSIS — N189 Chronic kidney disease, unspecified: Secondary | ICD-10-CM | POA: Diagnosis not present

## 2023-07-30 DIAGNOSIS — G4700 Insomnia, unspecified: Secondary | ICD-10-CM | POA: Diagnosis not present

## 2023-07-30 DIAGNOSIS — R600 Localized edema: Secondary | ICD-10-CM | POA: Diagnosis not present

## 2023-07-30 DIAGNOSIS — J449 Chronic obstructive pulmonary disease, unspecified: Secondary | ICD-10-CM | POA: Diagnosis not present

## 2023-07-30 DIAGNOSIS — I10 Essential (primary) hypertension: Secondary | ICD-10-CM | POA: Diagnosis not present

## 2023-07-30 DIAGNOSIS — K5900 Constipation, unspecified: Secondary | ICD-10-CM | POA: Diagnosis not present

## 2023-07-30 DIAGNOSIS — M542 Cervicalgia: Secondary | ICD-10-CM | POA: Diagnosis not present

## 2023-07-30 DIAGNOSIS — I998 Other disorder of circulatory system: Secondary | ICD-10-CM | POA: Diagnosis not present

## 2023-07-30 DIAGNOSIS — K2100 Gastro-esophageal reflux disease with esophagitis, without bleeding: Secondary | ICD-10-CM | POA: Diagnosis not present

## 2023-07-30 DIAGNOSIS — R5381 Other malaise: Secondary | ICD-10-CM | POA: Diagnosis not present

## 2023-07-30 DIAGNOSIS — D649 Anemia, unspecified: Secondary | ICD-10-CM | POA: Diagnosis not present

## 2023-07-30 DIAGNOSIS — R233 Spontaneous ecchymoses: Secondary | ICD-10-CM | POA: Diagnosis not present

## 2023-07-30 DIAGNOSIS — G4733 Obstructive sleep apnea (adult) (pediatric): Secondary | ICD-10-CM | POA: Diagnosis not present

## 2023-07-31 DIAGNOSIS — S51801A Unspecified open wound of right forearm, initial encounter: Secondary | ICD-10-CM | POA: Diagnosis not present

## 2023-07-31 DIAGNOSIS — I10 Essential (primary) hypertension: Secondary | ICD-10-CM | POA: Diagnosis not present

## 2023-08-01 DIAGNOSIS — G629 Polyneuropathy, unspecified: Secondary | ICD-10-CM | POA: Diagnosis not present

## 2023-08-01 DIAGNOSIS — K2100 Gastro-esophageal reflux disease with esophagitis, without bleeding: Secondary | ICD-10-CM | POA: Diagnosis not present

## 2023-08-01 DIAGNOSIS — R233 Spontaneous ecchymoses: Secondary | ICD-10-CM | POA: Diagnosis not present

## 2023-08-01 DIAGNOSIS — I4891 Unspecified atrial fibrillation: Secondary | ICD-10-CM | POA: Diagnosis not present

## 2023-08-01 DIAGNOSIS — M62838 Other muscle spasm: Secondary | ICD-10-CM | POA: Diagnosis not present

## 2023-08-01 DIAGNOSIS — E785 Hyperlipidemia, unspecified: Secondary | ICD-10-CM | POA: Diagnosis not present

## 2023-08-01 DIAGNOSIS — E222 Syndrome of inappropriate secretion of antidiuretic hormone: Secondary | ICD-10-CM | POA: Diagnosis not present

## 2023-08-01 DIAGNOSIS — I10 Essential (primary) hypertension: Secondary | ICD-10-CM | POA: Diagnosis not present

## 2023-08-01 DIAGNOSIS — J449 Chronic obstructive pulmonary disease, unspecified: Secondary | ICD-10-CM | POA: Diagnosis not present

## 2023-08-01 DIAGNOSIS — K5900 Constipation, unspecified: Secondary | ICD-10-CM | POA: Diagnosis not present

## 2023-08-01 DIAGNOSIS — G4733 Obstructive sleep apnea (adult) (pediatric): Secondary | ICD-10-CM | POA: Diagnosis not present

## 2023-08-01 DIAGNOSIS — R600 Localized edema: Secondary | ICD-10-CM | POA: Diagnosis not present

## 2023-08-07 DIAGNOSIS — S51801A Unspecified open wound of right forearm, initial encounter: Secondary | ICD-10-CM | POA: Diagnosis not present

## 2023-08-10 DIAGNOSIS — R011 Cardiac murmur, unspecified: Secondary | ICD-10-CM | POA: Diagnosis not present

## 2023-08-14 DIAGNOSIS — I77811 Abdominal aortic ectasia: Secondary | ICD-10-CM | POA: Diagnosis not present

## 2023-08-14 DIAGNOSIS — S51801A Unspecified open wound of right forearm, initial encounter: Secondary | ICD-10-CM | POA: Diagnosis not present

## 2023-08-16 DIAGNOSIS — I6523 Occlusion and stenosis of bilateral carotid arteries: Secondary | ICD-10-CM | POA: Diagnosis not present

## 2023-08-16 DIAGNOSIS — R0989 Other specified symptoms and signs involving the circulatory and respiratory systems: Secondary | ICD-10-CM | POA: Diagnosis not present

## 2023-08-17 DIAGNOSIS — I70223 Atherosclerosis of native arteries of extremities with rest pain, bilateral legs: Secondary | ICD-10-CM | POA: Diagnosis not present

## 2023-08-21 DIAGNOSIS — S51801A Unspecified open wound of right forearm, initial encounter: Secondary | ICD-10-CM | POA: Diagnosis not present

## 2023-08-24 DIAGNOSIS — D649 Anemia, unspecified: Secondary | ICD-10-CM | POA: Diagnosis not present

## 2023-08-24 DIAGNOSIS — S41112A Laceration without foreign body of left upper arm, initial encounter: Secondary | ICD-10-CM | POA: Diagnosis not present

## 2023-08-24 DIAGNOSIS — R296 Repeated falls: Secondary | ICD-10-CM | POA: Diagnosis not present

## 2023-08-26 ENCOUNTER — Emergency Department (HOSPITAL_COMMUNITY): Payer: No Typology Code available for payment source

## 2023-08-26 ENCOUNTER — Encounter (HOSPITAL_COMMUNITY): Payer: Self-pay | Admitting: Emergency Medicine

## 2023-08-26 ENCOUNTER — Inpatient Hospital Stay (HOSPITAL_COMMUNITY)
Admission: EM | Admit: 2023-08-26 | Discharge: 2023-08-30 | DRG: 563 | Disposition: A | Discharge status: Skilled Nursing Facility | Payer: No Typology Code available for payment source | Source: Skilled Nursing Facility | Attending: Internal Medicine | Admitting: Internal Medicine

## 2023-08-26 DIAGNOSIS — J449 Chronic obstructive pulmonary disease, unspecified: Secondary | ICD-10-CM | POA: Diagnosis present

## 2023-08-26 DIAGNOSIS — E222 Syndrome of inappropriate secretion of antidiuretic hormone: Secondary | ICD-10-CM | POA: Diagnosis present

## 2023-08-26 DIAGNOSIS — Z7982 Long term (current) use of aspirin: Secondary | ICD-10-CM

## 2023-08-26 DIAGNOSIS — H9193 Unspecified hearing loss, bilateral: Secondary | ICD-10-CM | POA: Diagnosis present

## 2023-08-26 DIAGNOSIS — I10 Essential (primary) hypertension: Secondary | ICD-10-CM | POA: Diagnosis present

## 2023-08-26 DIAGNOSIS — K219 Gastro-esophageal reflux disease without esophagitis: Secondary | ICD-10-CM | POA: Diagnosis present

## 2023-08-26 DIAGNOSIS — W19XXXA Unspecified fall, initial encounter: Principal | ICD-10-CM

## 2023-08-26 DIAGNOSIS — Z955 Presence of coronary angioplasty implant and graft: Secondary | ICD-10-CM

## 2023-08-26 DIAGNOSIS — M79603 Pain in arm, unspecified: Secondary | ICD-10-CM | POA: Diagnosis not present

## 2023-08-26 DIAGNOSIS — W010XXA Fall on same level from slipping, tripping and stumbling without subsequent striking against object, initial encounter: Secondary | ICD-10-CM | POA: Diagnosis present

## 2023-08-26 DIAGNOSIS — Z79899 Other long term (current) drug therapy: Secondary | ICD-10-CM

## 2023-08-26 DIAGNOSIS — E871 Hypo-osmolality and hyponatremia: Secondary | ICD-10-CM | POA: Diagnosis not present

## 2023-08-26 DIAGNOSIS — Y92129 Unspecified place in nursing home as the place of occurrence of the external cause: Secondary | ICD-10-CM

## 2023-08-26 DIAGNOSIS — G4733 Obstructive sleep apnea (adult) (pediatric): Secondary | ICD-10-CM | POA: Diagnosis present

## 2023-08-26 DIAGNOSIS — S59919A Unspecified injury of unspecified forearm, initial encounter: Secondary | ICD-10-CM | POA: Diagnosis not present

## 2023-08-26 DIAGNOSIS — Z85820 Personal history of malignant melanoma of skin: Secondary | ICD-10-CM

## 2023-08-26 DIAGNOSIS — I82409 Acute embolism and thrombosis of unspecified deep veins of unspecified lower extremity: Secondary | ICD-10-CM

## 2023-08-26 DIAGNOSIS — S59912A Unspecified injury of left forearm, initial encounter: Secondary | ICD-10-CM | POA: Diagnosis not present

## 2023-08-26 DIAGNOSIS — I252 Old myocardial infarction: Secondary | ICD-10-CM

## 2023-08-26 DIAGNOSIS — Z66 Do not resuscitate: Secondary | ICD-10-CM | POA: Diagnosis not present

## 2023-08-26 DIAGNOSIS — D689 Coagulation defect, unspecified: Secondary | ICD-10-CM | POA: Diagnosis present

## 2023-08-26 DIAGNOSIS — Z86718 Personal history of other venous thrombosis and embolism: Secondary | ICD-10-CM

## 2023-08-26 DIAGNOSIS — S52124A Nondisplaced fracture of head of right radius, initial encounter for closed fracture: Principal | ICD-10-CM

## 2023-08-26 DIAGNOSIS — Z87891 Personal history of nicotine dependence: Secondary | ICD-10-CM

## 2023-08-26 DIAGNOSIS — Z85828 Personal history of other malignant neoplasm of skin: Secondary | ICD-10-CM

## 2023-08-26 DIAGNOSIS — G629 Polyneuropathy, unspecified: Secondary | ICD-10-CM | POA: Diagnosis present

## 2023-08-26 DIAGNOSIS — R58 Hemorrhage, not elsewhere classified: Secondary | ICD-10-CM | POA: Diagnosis not present

## 2023-08-26 DIAGNOSIS — I2510 Atherosclerotic heart disease of native coronary artery without angina pectoris: Secondary | ICD-10-CM | POA: Diagnosis present

## 2023-08-26 DIAGNOSIS — E785 Hyperlipidemia, unspecified: Secondary | ICD-10-CM | POA: Diagnosis present

## 2023-08-26 DIAGNOSIS — Z887 Allergy status to serum and vaccine status: Secondary | ICD-10-CM

## 2023-08-26 DIAGNOSIS — Z809 Family history of malignant neoplasm, unspecified: Secondary | ICD-10-CM

## 2023-08-26 DIAGNOSIS — Z7901 Long term (current) use of anticoagulants: Secondary | ICD-10-CM

## 2023-08-26 DIAGNOSIS — Z86711 Personal history of pulmonary embolism: Secondary | ICD-10-CM

## 2023-08-26 DIAGNOSIS — Z888 Allergy status to other drugs, medicaments and biological substances status: Secondary | ICD-10-CM

## 2023-08-26 DIAGNOSIS — E878 Other disorders of electrolyte and fluid balance, not elsewhere classified: Secondary | ICD-10-CM | POA: Diagnosis present

## 2023-08-26 NOTE — ED Triage Notes (Signed)
Pt here from Nursing home with c/o unwitnessed fall , has been falling a lot recently , no loc is on thinners but did not hit head , pt has new lac to right elbow and old lac to left forearm

## 2023-08-27 ENCOUNTER — Emergency Department (HOSPITAL_COMMUNITY): Payer: No Typology Code available for payment source

## 2023-08-27 ENCOUNTER — Other Ambulatory Visit: Payer: Self-pay

## 2023-08-27 DIAGNOSIS — E785 Hyperlipidemia, unspecified: Secondary | ICD-10-CM | POA: Diagnosis present

## 2023-08-27 DIAGNOSIS — E871 Hypo-osmolality and hyponatremia: Secondary | ICD-10-CM | POA: Diagnosis present

## 2023-08-27 DIAGNOSIS — Z85820 Personal history of malignant melanoma of skin: Secondary | ICD-10-CM | POA: Diagnosis not present

## 2023-08-27 DIAGNOSIS — K219 Gastro-esophageal reflux disease without esophagitis: Secondary | ICD-10-CM | POA: Diagnosis present

## 2023-08-27 DIAGNOSIS — I82409 Acute embolism and thrombosis of unspecified deep veins of unspecified lower extremity: Secondary | ICD-10-CM

## 2023-08-27 DIAGNOSIS — E878 Other disorders of electrolyte and fluid balance, not elsewhere classified: Secondary | ICD-10-CM | POA: Diagnosis present

## 2023-08-27 DIAGNOSIS — Z87891 Personal history of nicotine dependence: Secondary | ICD-10-CM | POA: Diagnosis not present

## 2023-08-27 DIAGNOSIS — Z79899 Other long term (current) drug therapy: Secondary | ICD-10-CM | POA: Diagnosis not present

## 2023-08-27 DIAGNOSIS — G629 Polyneuropathy, unspecified: Secondary | ICD-10-CM

## 2023-08-27 DIAGNOSIS — S52124A Nondisplaced fracture of head of right radius, initial encounter for closed fracture: Secondary | ICD-10-CM

## 2023-08-27 DIAGNOSIS — J449 Chronic obstructive pulmonary disease, unspecified: Secondary | ICD-10-CM

## 2023-08-27 DIAGNOSIS — E222 Syndrome of inappropriate secretion of antidiuretic hormone: Secondary | ICD-10-CM

## 2023-08-27 DIAGNOSIS — D689 Coagulation defect, unspecified: Secondary | ICD-10-CM | POA: Diagnosis present

## 2023-08-27 DIAGNOSIS — I10 Essential (primary) hypertension: Secondary | ICD-10-CM

## 2023-08-27 DIAGNOSIS — G4733 Obstructive sleep apnea (adult) (pediatric): Secondary | ICD-10-CM

## 2023-08-27 DIAGNOSIS — I252 Old myocardial infarction: Secondary | ICD-10-CM | POA: Diagnosis not present

## 2023-08-27 DIAGNOSIS — H9193 Unspecified hearing loss, bilateral: Secondary | ICD-10-CM | POA: Diagnosis present

## 2023-08-27 DIAGNOSIS — Z887 Allergy status to serum and vaccine status: Secondary | ICD-10-CM | POA: Diagnosis not present

## 2023-08-27 DIAGNOSIS — Z86711 Personal history of pulmonary embolism: Secondary | ICD-10-CM | POA: Diagnosis not present

## 2023-08-27 DIAGNOSIS — Z66 Do not resuscitate: Secondary | ICD-10-CM | POA: Diagnosis not present

## 2023-08-27 DIAGNOSIS — Z85828 Personal history of other malignant neoplasm of skin: Secondary | ICD-10-CM | POA: Diagnosis not present

## 2023-08-27 DIAGNOSIS — W010XXA Fall on same level from slipping, tripping and stumbling without subsequent striking against object, initial encounter: Secondary | ICD-10-CM | POA: Diagnosis present

## 2023-08-27 DIAGNOSIS — W19XXXA Unspecified fall, initial encounter: Secondary | ICD-10-CM | POA: Diagnosis not present

## 2023-08-27 DIAGNOSIS — Y92129 Unspecified place in nursing home as the place of occurrence of the external cause: Secondary | ICD-10-CM | POA: Diagnosis not present

## 2023-08-27 DIAGNOSIS — Z955 Presence of coronary angioplasty implant and graft: Secondary | ICD-10-CM | POA: Diagnosis not present

## 2023-08-27 DIAGNOSIS — Z86718 Personal history of other venous thrombosis and embolism: Secondary | ICD-10-CM | POA: Diagnosis not present

## 2023-08-27 DIAGNOSIS — Z7982 Long term (current) use of aspirin: Secondary | ICD-10-CM | POA: Diagnosis not present

## 2023-08-27 DIAGNOSIS — I2510 Atherosclerotic heart disease of native coronary artery without angina pectoris: Secondary | ICD-10-CM

## 2023-08-27 DIAGNOSIS — Z7901 Long term (current) use of anticoagulants: Secondary | ICD-10-CM | POA: Diagnosis not present

## 2023-08-27 LAB — TSH: TSH: 1.263 u[IU]/mL (ref 0.350–4.500)

## 2023-08-27 LAB — BASIC METABOLIC PANEL
Anion gap: 8 (ref 5–15)
Anion gap: 11 (ref 5–15)
Anion gap: 11 (ref 5–15)
Anion gap: 6 (ref 5–15)
Anion gap: 8 (ref 5–15)
BUN: 21 mg/dL (ref 8–23)
BUN: 16 mg/dL (ref 8–23)
BUN: 14 mg/dL (ref 8–23)
BUN: 15 mg/dL (ref 8–23)
BUN: 19 mg/dL (ref 8–23)
CO2: 19 mmol/L — ABNORMAL LOW (ref 22–32)
CO2: 17 mmol/L — ABNORMAL LOW (ref 22–32)
CO2: 18 mmol/L — ABNORMAL LOW (ref 22–32)
CO2: 20 mmol/L — ABNORMAL LOW (ref 22–32)
CO2: 19 mmol/L — ABNORMAL LOW (ref 22–32)
Calcium: 8.1 mg/dL — ABNORMAL LOW (ref 8.9–10.3)
Calcium: 8.3 mg/dL — ABNORMAL LOW (ref 8.9–10.3)
Calcium: 8.6 mg/dL — ABNORMAL LOW (ref 8.9–10.3)
Calcium: 8 mg/dL — ABNORMAL LOW (ref 8.9–10.3)
Calcium: 8 mg/dL — ABNORMAL LOW (ref 8.9–10.3)
Chloride: 88 mmol/L — ABNORMAL LOW (ref 98–111)
Chloride: 91 mmol/L — ABNORMAL LOW (ref 98–111)
Chloride: 91 mmol/L — ABNORMAL LOW (ref 98–111)
Chloride: 95 mmol/L — ABNORMAL LOW (ref 98–111)
Chloride: 94 mmol/L — ABNORMAL LOW (ref 98–111)
Creatinine, Ser: 0.86 mg/dL (ref 0.61–1.24)
Creatinine, Ser: 0.84 mg/dL (ref 0.61–1.24)
Creatinine, Ser: 0.85 mg/dL (ref 0.61–1.24)
Creatinine, Ser: 0.84 mg/dL (ref 0.61–1.24)
Creatinine, Ser: 0.98 mg/dL (ref 0.61–1.24)
GFR, Estimated: >60 mL/min (ref >60)
GFR, Estimated: >60 mL/min (ref >60)
GFR, Estimated: >60 mL/min (ref >60)
GFR, Estimated: >60 mL/min (ref >60)
GFR, Estimated: >60 mL/min (ref >60)
Glucose, Bld: 92 mg/dL (ref 70–99)
Glucose, Bld: 94 mg/dL (ref 70–99)
Glucose, Bld: 109 mg/dL — ABNORMAL HIGH (ref 70–99)
Glucose, Bld: 118 mg/dL — ABNORMAL HIGH (ref 70–99)
Glucose, Bld: 107 mg/dL — ABNORMAL HIGH (ref 70–99)
Potassium: 4.8 mmol/L (ref 3.5–5.1)
Potassium: 4.7 mmol/L (ref 3.5–5.1)
Potassium: 4.1 mmol/L (ref 3.5–5.1)
Potassium: 4.4 mmol/L (ref 3.5–5.1)
Potassium: 4.4 mmol/L (ref 3.5–5.1)
Sodium: 115 mmol/L — CL (ref 135–145)
Sodium: 119 mmol/L — CL (ref 135–145)
Sodium: 120 mmol/L — ABNORMAL LOW (ref 135–145)
Sodium: 121 mmol/L — ABNORMAL LOW (ref 135–145)
Sodium: 121 mmol/L — ABNORMAL LOW (ref 135–145)

## 2023-08-27 LAB — PROTIME-INR
INR: 1.4 — ABNORMAL HIGH (ref 0.8–1.2)
Prothrombin Time: 17.5 seconds — ABNORMAL HIGH (ref 11.4–15.2)

## 2023-08-27 LAB — URINALYSIS, ROUTINE W REFLEX MICROSCOPIC
Bilirubin Urine: NEGATIVE
Glucose, UA: NEGATIVE mg/dL
Hgb urine dipstick: NEGATIVE
Ketones, ur: NEGATIVE mg/dL
Leukocytes,Ua: NEGATIVE
Nitrite: NEGATIVE
Protein, ur: NEGATIVE mg/dL
Specific Gravity, Urine: 1.008 (ref 1.005–1.030)
pH: 7 (ref 5.0–8.0)

## 2023-08-27 LAB — CBC WITH DIFFERENTIAL/PLATELET
Abs Immature Granulocytes: 0.02 10*3/uL (ref 0.00–0.07)
Basophils Absolute: 0.1 10*3/uL (ref 0.0–0.1)
Basophils Relative: 1 %
Eosinophils Absolute: 0.2 10*3/uL (ref 0.0–0.5)
Eosinophils Relative: 3 %
HCT: 30 % — ABNORMAL LOW (ref 39.0–52.0)
Hemoglobin: 10.5 g/dL — ABNORMAL LOW (ref 13.0–17.0)
Immature Granulocytes: 0 %
Lymphocytes Relative: 32 %
Lymphs Abs: 2.4 10*3/uL (ref 0.7–4.0)
MCH: 31.3 pg (ref 26.0–34.0)
MCHC: 35 g/dL (ref 30.0–36.0)
MCV: 89.6 fL (ref 80.0–100.0)
Monocytes Absolute: 1 10*3/uL (ref 0.1–1.0)
Monocytes Relative: 13 %
Neutro Abs: 3.7 10*3/uL (ref 1.7–7.7)
Neutrophils Relative %: 51 %
Platelets: 261 10*3/uL (ref 150–400)
RBC: 3.35 MIL/uL — ABNORMAL LOW (ref 4.22–5.81)
RDW: 12.5 % (ref 11.5–15.5)
WBC: 7.3 10*3/uL (ref 4.0–10.5)
nRBC: 0 % (ref 0.0–0.2)

## 2023-08-27 LAB — MAGNESIUM: Magnesium: 1.9 mg/dL (ref 1.7–2.4)

## 2023-08-27 LAB — HEMOGLOBIN AND HEMATOCRIT, BLOOD
HCT: 26.2 % — ABNORMAL LOW (ref 39.0–52.0)
Hemoglobin: 9.4 g/dL — ABNORMAL LOW (ref 13.0–17.0)

## 2023-08-27 LAB — OSMOLALITY, URINE: Osmolality, Ur: 280 mOsm/kg — ABNORMAL LOW (ref 300–900)

## 2023-08-27 LAB — SODIUM, URINE, RANDOM: Sodium, Ur: 56 mmol/L

## 2023-08-27 LAB — OSMOLALITY: Osmolality: 257 mOsm/kg — ABNORMAL LOW (ref 275–295)

## 2023-08-27 LAB — PHOSPHORUS: Phosphorus: 3.1 mg/dL (ref 2.5–4.6)

## 2023-08-27 MED ORDER — ACETAMINOPHEN 650 MG RE SUPP: 650.0000 mg | Freq: Four times a day (QID) | RECTAL | Status: DC | PRN

## 2023-08-27 MED ORDER — OXIDIZED CELLULOSE EX PADS
1.0000 | MEDICATED_PAD | Freq: Once | CUTANEOUS | Status: AC
  Administered 2023-08-27: 1 via TOPICAL
  Filled 2023-08-27: qty 1

## 2023-08-27 MED ORDER — ONDANSETRON HCL 4 MG PO TABS: 4.0000 mg | ORAL_TABLET | Freq: Four times a day (QID) | ORAL | Status: DC | PRN

## 2023-08-27 MED ORDER — SODIUM CHLORIDE 0.9 % IV BOLUS
1000.0000 mL | Freq: Once | INTRAVENOUS | Status: AC
  Administered 2023-08-27: 500 mL via INTRAVENOUS

## 2023-08-27 MED ORDER — ENOXAPARIN SODIUM 40 MG/0.4ML IJ SOSY
40.0000 mg | PREFILLED_SYRINGE | Freq: Every day | INTRAMUSCULAR | Status: DC
  Administered 2023-08-27 – 2023-08-30 (×4): 40 mg via SUBCUTANEOUS
  Filled 2023-08-27 (×4): qty 0.4

## 2023-08-27 MED ORDER — ONDANSETRON HCL 4 MG/2ML IJ SOLN: 4.0000 mg | Freq: Four times a day (QID) | INTRAMUSCULAR | Status: DC | PRN

## 2023-08-27 MED ORDER — ACETAMINOPHEN 325 MG PO TABS
650.0000 mg | ORAL_TABLET | Freq: Four times a day (QID) | ORAL | Status: DC | PRN
  Administered 2023-08-29: 650 mg via ORAL
  Filled 2023-08-27: qty 2

## 2023-08-27 MED ORDER — SODIUM CHLORIDE 1 G PO TABS
1.0000 g | ORAL_TABLET | Freq: Three times a day (TID) | ORAL | Status: DC
  Administered 2023-08-27 – 2023-08-30 (×9): 1 g via ORAL
  Filled 2023-08-27 (×12): qty 1

## 2023-08-27 MED ORDER — SENNOSIDES-DOCUSATE SODIUM 8.6-50 MG PO TABS: 1.0000 | ORAL_TABLET | Freq: Every evening | ORAL | Status: DC | PRN

## 2023-08-27 NOTE — ED Notes (Signed)
Trauma Response Nurse Documentation   Uzoma Hudelson is a 89 y.o. male arriving to [facility] ED via EMS  On Eliquis (apixaban) daily. Trauma was activated as a Level 2 by EDP based on the following trauma criteria Elderly patients > 65 with head trauma on anti-coagulation (excluding ASA). Activated over an hour after arrival due to possible head injury (pt is unsure) which created a delay in CT arrival. Patient cleared for CT by Dr. Manus Gunning EDP. Pt transported to CT with trauma response nurse present to monitor. RN remained with the patient throughout their absence from the department for clinical observation.   GCS 14.  History   Past Medical History:  Diagnosis Date   Bilateral hearing loss    COPD (chronic obstructive pulmonary disease) (HCC)    DVT (deep venous thrombosis) (HCC)    GERD (gastroesophageal reflux disease)    History of pulmonary embolus (PE)    HLD (hyperlipidemia)    HTN (hypertension)    Melanoma (HCC)    NSTEMI (non-ST elevated myocardial infarction) (HCC)    OSA (obstructive sleep apnea)    Peripheral neuropathy    Squamous cell skin cancer      Past Surgical History:  Procedure Laterality Date   BIOPSY  11/07/2021   Procedure: BIOPSY;  Surgeon: Sherrilyn Rist, MD;  Location: [facility] ENDOSCOPY;  Service: Gastroenterology;;   ESOPHAGOGASTRODUODENOSCOPY (EGD) WITH PROPOFOL N/A 11/07/2021   Procedure: ESOPHAGOGASTRODUODENOSCOPY (EGD) WITH PROPOFOL;  Surgeon: Sherrilyn Rist, MD;  Location: [facility] ENDOSCOPY;  Service: Gastroenterology;  Laterality: N/A;   PERCUTANEOUS CORONARY STENT INTERVENTION (PCI-S)     RCA       Initial Focused Assessment (If applicable, or please see trauma documentation): Airway patent/unobstucted, BS clear Lac to right elbow with bleeding controlled by gauze GCS 14  CT's Completed:   CT Head and CT C-Spine   Interventions:  CT head and c spine Portable chest right elbow and pelvis XRAY EKG  Plan for disposition:  Admission  to floor   Consults completed:  Hospitalist  Event Summary: Presents via EMS from SNF after a fall with unknown head injury. Pt with confusion to baseline. New lac to right elbow. I escorted pt to CT and back.   Bedside handoff with ED RN [place].    Setareh Rom O Meghana Tullo  Trauma Response RN  Please call TRN at [phone] for further assistance.

## 2023-08-27 NOTE — Progress Notes (Addendum)
Patient seen and examined.  Admitted early morning hours by nighttime hospitalist.  I also called patient's son to get more information.  In brief, 89 year old gentleman with history of hard of hearing, SIADH and chronic hyponatremia, hypertension, hyperlipidemia and history of DVT currently on Eliquis who [place] as assisted living facility after he was found with unwitnessed fall and bleeding from his right arm.  Patient was conscious but hard of hearing.  Hemodynamically stable in the ER.  Sodium 115.  Skeletal survey indicated unhealed comminuted type II dens fracture, old radial head fracture on the right side.  He was also found to have a skin tear on the right elbow dressing applied.  Patient was admitted to the hospital with hyponatremia, unwitnessed fall with negative skeletal survey.  Seen and examined.  Denies any complaints.  He was telling me that his son is coming to pick him up to take him to the [place].  Still has bleeding through the dressing on right arm. Sodium is appropriately improving 115-119-120.  Avoid aggressive correction. Previously diagnosed SIADH. Regular diet, no salt restriction Salt tablets 1 g 3 times daily Check sodium every 6 hours for next 24 hours until stabilization.  Will avoid sudden correction.  Urine sodium pending.  TSH, B12 normal. Patient does have old fracture of C2.  Currently no new neurological findings.  I discussed with patient's son about MRI of the C-spine ordered, more information that we can get from it and likely no intervention that we can offer for old fracture.  He did not want the patient to go through MRI but rather treat symptomatically.  Work with PT OT.  Improved sodium levels.  Likely discharge tomorrow.  If he needs more support, may need to go to SNF [place] [employer].  Same-day admit.  No charge visit.  Addendum, patient continues to have bleeding from right elbow laceration.  Wound reexamined due to ongoing  bleeding.  He has a large skin tear and diffuse oozing without an identifiable source.  Dressing reinforced with compression bandage.  If continues to bleed, will apply Surgicel and compression bandage.  Check hemoglobin.  He is on Eliquis, has brisk bleeding even with IV line.

## 2023-08-27 NOTE — ED Provider Notes (Signed)
[facility] Provider Note   CSN: [id number] Arrival date & time: 08/26/23  2235     History  Chief Complaint  Patient presents with   Marcus Martin is a 89 y.o. male.  HPI   Patient with medical history including COPD, DVT, hypertension, currently on Eliquis, SIADH, presenting from nursing facility after a unwitnessed fall.  Patient is found on the ground, unknown how long, initially patient states he did not hit his head but when I spoke with him patient endorsed that he did hit his head, knee is not endorsing any neck pain back pain chest pain abdominal pain denies any pain in his left upper extremity or lower extremities, does state he has some slight pain in his right elbow.  Patient states that he was trying to walk to the bathroom states that he lost his balance because of fall onto his right side.  He states that he was on the ground for short period of time.    Home Medications Prior to Admission medications   Medication Sig Start Date End Date Taking? Authorizing Provider  acetaminophen (TYLENOL) 500 MG tablet Take 1,000 mg by mouth every 8 (eight) hours as needed for fever.    [provider]  Aclidinium Bromide (TUDORZA PRESSAIR) 400 MCG/ACT AEPB Inhale 1 puff into the lungs in the morning and at bedtime. 01/12/23   Levin Erp, MD  albuterol (VENTOLIN HFA) 108 (90 Base) MCG/ACT inhaler Inhale 2 puffs into the lungs every 6 (six) hours as needed for wheezing or shortness of breath.    [provider]  apixaban (ELIQUIS) 5 MG TABS tablet Take 1 tablet (5 mg total) by mouth 2 (two) times daily. 01/22/23   Levin Erp, MD  ascorbic acid (VITAMIN C) 500 MG tablet Take 500 mg by mouth daily.    [provider]  aspirin EC 81 MG tablet Take 81 mg by mouth daily.    [provider]  atorvastatin (LIPITOR) 40 MG tablet Take 0.5 tablets (20 mg total) by mouth daily. 01/22/23   Levin Erp, MD  Brinzolamide-Brimonidine [facility]) 1-0.2 % SUSP Place 1 drop into both eyes 2 (two) times daily.    [provider]  Calcium Carb-Cholecalciferol (CALCIUM 500 +D) 500-10 MG-MCG TABS Take 1 tablet by mouth daily.    [provider]  carboxymethylcellulose (REFRESH TEARS) 0.5 % SOLN Place 1 drop into both eyes 4 (four) times daily.    [provider]  cephALEXin (KEFLEX) 500 MG capsule Take 1 capsule (500 mg total) by mouth 2 (two) times daily. 05/29/23   Small, Brooke L, PA  cholecalciferol 25 MCG (1000 UT) tablet Take 1,000 Units by mouth daily.    [provider]  docusate sodium (COLACE) 100 MG capsule Take 100 mg by mouth 2 (two) times daily.    [provider]  ferrous sulfate 325 (65 FE) MG EC tablet Take 325 mg by mouth every Monday, Wednesday, and Friday.    [provider]  fluticasone-salmeterol (WIXELA INHUB) 250-50 MCG/ACT AEPB Inhale 1 puff into the lungs in the morning and at bedtime.    [provider]  furosemide (LASIX) 20 MG tablet Take 20 mg by mouth daily as needed (Overload: Weight gain of 3 pounds, 5 pounds, weight gain over a week, shortness of breath and/or swelling).    [provider]  gabapentin (NEURONTIN) 300 MG capsule Take 1 capsule (300 mg total) by mouth  2 (two) times daily. 01/12/23   Levin Erp, MD  ipratropium-albuterol (DUONEB) 0.5-2.5 (3) MG/3ML SOLN Take 3 mLs by nebulization every 6 (six) hours as needed (for shortness of breath and wheezing).    [provider]  latanoprost (XALATAN) 0.005 % ophthalmic solution Place 1 drop into both eyes at bedtime.    [provider]  lisinopril (ZESTRIL) 10 MG tablet Take 5 mg by mouth daily. Hold for SBP less than 100.    [provider]  lisinopril (ZESTRIL) 5 MG tablet Take 1 tablet (5 mg total) by mouth daily. Patient not taking: Reported on 05/29/2023 05/19/23 06/18/23  Berton Mount I, MD  Lutein 10 MG  TABS Take 10 mg by mouth in the morning.    [provider]  Multiple Vitamin (MULTIVITAMIN) tablet Take 1 tablet by mouth daily. Multivitamin + Folic acid 400 mcg.    [provider]  nystatin powder Apply 1 Application topically every 12 (twelve) hours as needed (irritation).    [provider]  pantoprazole (PROTONIX) 40 MG tablet Take 1 tablet (40 mg total) by mouth 2 (two) times daily before a meal. Patient taking differently: Take 40 mg by mouth 2 (two) times daily. 11/08/21   Merrilyn Puma, MD  polyethylene glycol (MIRALAX / GLYCOLAX) 17 g packet Take 17 g by mouth See admin instructions. 17 grams on Monday, Wednesday, Friday, one dose as needed for constipation.    [provider]  Probiotic Product (RISA-BID PROBIOTIC) TABS Take 1 tablet by mouth in the morning.    [provider]  sodium chloride 1 g tablet Take 1 tablet (1 g total) by mouth 3 (three) times daily with meals. 11/08/21   Merrilyn Puma, MD  tamsulosin (FLOMAX) 0.4 MG CAPS capsule Take 2 capsules (0.8 mg total) by mouth at bedtime. 01/22/23   Levin Erp, MD  traZODone (DESYREL) 50 MG tablet Take 0.5 tablets (25 mg total) by mouth at bedtime. 01/12/23   Levin Erp, MD      Allergies    Ticagrelor, Coffea arabica, Simvastatin, Tetanus toxoids, Spiriva respimat [tiotropium bromide monohydrate], and Niacin    Review of Systems   Review of Systems  Constitutional:  Negative for chills and fever.  Respiratory:  Negative for shortness of breath.   Cardiovascular:  Negative for chest pain.  Gastrointestinal:  Negative for abdominal pain.  Musculoskeletal:        Right elbow pain  Neurological:  Negative for headaches.    Physical Exam Updated Vital Signs BP (!) 178/64   Pulse 67   Temp 97.8 F (36.6 C) (Oral)   Resp 18   SpO2 97%  Physical Exam Vitals and nursing note reviewed.  Constitutional:      General: He is not in acute distress.    Appearance: He is not  ill-appearing.  HENT:     Head: Normocephalic and atraumatic.     Comments: There is no evidence of trauma of the head no raccoon eyes or Battle sign present.    Nose: No congestion.     Mouth/Throat:     Mouth: Mucous membranes are moist.     Pharynx: Oropharynx is clear.     Comments: No trismus no torticollis no oral trauma present. Eyes:     Extraocular Movements: Extraocular movements intact.     Conjunctiva/sclera: Conjunctivae normal.     Pupils: Pupils are equal, round, and reactive to light.  Cardiovascular:     Rate and Rhythm: Normal rate and regular  rhythm.     Pulses: Normal pulses.     Heart sounds: No murmur heard.    No friction rub. No gallop.  Pulmonary:     Effort: No respiratory distress.     Breath sounds: No wheezing, rhonchi or rales.     Comments: No evidence of trauma the chest chest is nontender lung sounds are clear bilaterally. Abdominal:     Palpations: Abdomen is soft.     Tenderness: There is no abdominal tenderness. There is no right CVA tenderness or left CVA tenderness.     Comments: No evidence of trauma of the abdomen abdomen soft nontender.  Musculoskeletal:     Comments: Spine was palpated was nontender to palpation no step-off deformities noted no pelvis instability no leg shortening.  Patient has noted skin abrasion over his right lateral epicondyle, hemodynamically stable, tender to palpation, he has full range of motion in his fingers wrist and elbow, he is neurovascular intact in the upper and lower extremities.  Skin:    General: Skin is warm and dry.  Neurological:     Mental Status: He is alert.     Comments: No facial asymmetry no difficulty with word finding following two-step commands there is no regular weakness present my examination.  Psychiatric:        Mood and Affect: Mood normal.     ED Results / Procedures / Treatments   Labs (all labs ordered are listed, but only abnormal results are displayed) Labs Reviewed  CBC WITH  DIFFERENTIAL/PLATELET - Abnormal; Notable for the following components:      Result Value   RBC 3.35 (*)    Hemoglobin 10.5 (*)    HCT 30.0 (*)    All other components within normal limits  BASIC METABOLIC PANEL - Abnormal; Notable for the following components:   Sodium 115 (*)    Chloride 88 (*)    CO2 19 (*)    Calcium 8.1 (*)    All other components within normal limits  PROTIME-INR - Abnormal; Notable for the following components:   Prothrombin Time 17.5 (*)    INR 1.4 (*)    All other components within normal limits  OSMOLALITY, URINE - Abnormal; Notable for the following components:   Osmolality, Ur 280 (*)    All other components within normal limits  URINALYSIS, ROUTINE W REFLEX MICROSCOPIC  SODIUM, URINE, RANDOM  TSH  OSMOLALITY  BASIC METABOLIC PANEL    EKG EKG Interpretation Date/Time:  Sunday August 26 2023 22:42:22 EDT Ventricular Rate:  65 PR Interval:  280 QRS Duration:  106 QT Interval:  388 QTC Calculation: 404 R Axis:   94  Text Interpretation: Right and left arm electrode reversal, interpretation assumes no reversal Sinus or ectopic atrial rhythm Prolonged PR interval Low voltage, extremity leads Nonspecific T abnormalities, lateral leads No significant change was found Confirmed by Glynn Octave [phone]) on 08/27/2023 1:31:53 AM  Radiology CT Head Wo Contrast  Result Date: 08/27/2023 CLINICAL DATA:  Head trauma, minor (Age >= 65y); Neck trauma (Age >= 65y) Unwitnessed fall EXAM: CT HEAD WITHOUT CONTRAST CT CERVICAL SPINE WITHOUT CONTRAST TECHNIQUE: Multidetector CT imaging of the head and cervical spine was performed following the standard protocol without intravenous contrast. Multiplanar CT image reconstructions of the cervical spine were also generated. RADIATION DOSE REDUCTION: This exam was performed according to the departmental dose-optimization program which includes automated exposure control, adjustment of the mA and/or kV according to  patient size and/or use of iterative reconstruction  technique. COMPARISON:  CT head and C-spine 05/29/2023 FINDINGS: CT HEAD FINDINGS Brain: Cerebral ventricle sizes are concordant with the degree of cerebral volume loss. Patchy and confluent areas of decreased attenuation are noted throughout the deep and periventricular white matter of the cerebral hemispheres bilaterally, compatible with chronic microvascular ischemic disease. No evidence of large-territorial acute infarction. No parenchymal hemorrhage. No mass lesion. No extra-axial collection. No mass effect or midline shift. No hydrocephalus. Basilar cisterns are patent. Vascular: No hyperdense vessel. Atherosclerotic calcifications are present within the cavernous internal carotid and vertebral arteries. Skull: No acute fracture or focal lesion. Sinuses/Orbits: Right ethmoid sinus mucosal thickening. Paranasal sinuses and mastoid air cells are clear. The orbits are unremarkable. Other: None. CT CERVICAL SPINE FINDINGS Alignment: Mildly worsened anterolisthesis of C1 and C2 in relation to the basion. Stable grade 1 anterolisthesis of C3 on C4. 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 Interval worsened distraction of a known unhealed comminuted type 2 dens fracture. Anterior cervical discectomy and fusion of the C5-C6 level. At least moderate osseous neural foraminal stenosis at the C3-C4 level. No severe osseous central canal stenosis. No aggressive appearing focal osseous lesion or focal pathologic process. Soft tissues and spinal canal: No prevertebral fluid or swelling. No visible canal hematoma. Upper chest: Partially visualized right apical bullous changes. Other: None. IMPRESSION: 1. No acute intracranial abnormality. 2. Mildly worsened anterolisthesis of C1 and C2 in relation to the basion. Interval worsened distraction of a known unhealed comminuted type 2 dens fracture. Recommend MRI cervical spine to  further evaluate acuity and associated ligaments. 3. 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 Electronically Signed   By: Tish Frederickson M.D.   On: 08/27/2023 01:30   CT Cervical Spine Wo Contrast  Result Date: 08/27/2023 CLINICAL DATA:  Head trauma, minor (Age >= 65y); Neck trauma (Age >= 65y) Unwitnessed fall EXAM: CT HEAD WITHOUT CONTRAST CT CERVICAL SPINE WITHOUT CONTRAST TECHNIQUE: Multidetector CT imaging of the head and cervical spine was performed following the standard protocol without intravenous contrast. Multiplanar CT image reconstructions of the cervical spine were also generated. RADIATION DOSE REDUCTION: This exam was performed according to the departmental dose-optimization program which includes automated exposure control, adjustment of the mA and/or kV according to patient size and/or use of iterative reconstruction technique. COMPARISON:  CT head and C-spine 05/29/2023 FINDINGS: CT HEAD FINDINGS Brain: Cerebral ventricle sizes are concordant with the degree of cerebral volume loss. Patchy and confluent areas of decreased attenuation are noted throughout the deep and periventricular white matter of the cerebral hemispheres bilaterally, compatible with chronic microvascular ischemic disease. No evidence of large-territorial acute infarction. No parenchymal hemorrhage. No mass lesion. No extra-axial collection. No mass effect or midline shift. No hydrocephalus. Basilar cisterns are patent. Vascular: No hyperdense vessel. Atherosclerotic calcifications are present within the cavernous internal carotid and vertebral arteries. Skull: No acute fracture or focal lesion. Sinuses/Orbits: Right ethmoid sinus mucosal thickening. Paranasal sinuses and mastoid air cells are clear. The orbits are unremarkable. Other: None. CT CERVICAL SPINE FINDINGS Alignment: Mildly worsened anterolisthesis of C1 and C2 in relation to the basion. Stable grade 1 anterolisthesis of C3 on C4. 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 Interval worsened distraction of a known unhealed comminuted type 2 dens fracture. Anterior cervical discectomy and fusion of the C5-C6 level. At least moderate osseous neural foraminal stenosis at the C3-C4 level. No severe osseous central canal stenosis. No aggressive appearing focal osseous  lesion or focal pathologic process. Soft tissues and spinal canal: No prevertebral fluid or swelling. No visible canal hematoma. Upper chest: Partially visualized right apical bullous changes. Other: None. IMPRESSION: 1. No acute intracranial abnormality. 2. Mildly worsened anterolisthesis of C1 and C2 in relation to the basion. Interval worsened distraction of a known unhealed comminuted type 2 dens fracture. Recommend MRI cervical spine to further evaluate acuity and associated ligaments. 3. 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 Electronically Signed   By: Tish Frederickson M.D.   On: 08/27/2023 01:30   DG Elbow Complete Right  Result Date: 08/26/2023 CLINICAL DATA:  Unwitnessed fall. EXAM: RIGHT ELBOW - COMPLETE 3+ VIEW COMPARISON:  None Available. FINDINGS: A small area of cortical irregularity of indeterminate age is seen involving the right radial head. There is no evidence of dislocation. A radiopaque fixation plate and screws are seen overlying the mid right ulnar shaft. There is mild posterior soft tissue swelling. IMPRESSION: Small radial head fracture of indeterminate age. Correlation with physical examination is recommended to determine the presence of point tenderness. Electronically Signed   By: Aram Candela M.D.   On: 08/26/2023 23:45   DG Pelvis Portable  Result Date: 08/26/2023 CLINICAL DATA:  Unwitnessed fall. EXAM: PORTABLE PELVIS 1-2 VIEWS COMPARISON:  May 16, 2023 FINDINGS: There is no evidence of an acute pelvic fracture or diastasis. Degenerative changes seen involving both hips, in the form  of joint space narrowing, acetabular sclerosis and lateral acetabular bony spurring. No pelvic bone lesions are seen. IMPRESSION: Degenerative changes without evidence of an acute osseous abnormality. Electronically Signed   By: Aram Candela M.D.   On: 08/26/2023 23:42   DG Chest Portable 1 View  Result Date: 08/26/2023 CLINICAL DATA:  Unwitnessed fall. EXAM: PORTABLE CHEST 1 VIEW COMPARISON:  May 15, 2023 FINDINGS: The heart size and mediastinal contours are within normal limits. There is marked severity calcification of the thoracic aorta. Mild, chronic appearing increased lung markings are seen with a stable cavitary lesion seen within the medial aspect of the right upper lobe. Mild atelectasis is seen within the bilateral lung bases, left greater than right. No pleural effusion or pneumothorax is identified. Multilevel degenerative changes are seen throughout the thoracic spine. IMPRESSION: Stable right upper lobe cavitary lesion with mild bibasilar atelectasis, left greater than right. Electronically Signed   By: Aram Candela M.D.   On: 08/26/2023 23:40    Procedures .Critical Care  Performed by: Carroll Sage, PA-C Authorized by: Carroll Sage, PA-C   Critical care provider statement:    Critical care time (minutes):  30   Critical care time was exclusive of:  Separately billable procedures and treating other patients   Critical care was necessary to treat or prevent imminent or life-threatening deterioration of the following conditions:  Trauma and metabolic crisis   Critical care was time spent personally by me on the following activities:  Development of treatment plan with patient or surrogate, discussions with consultants, evaluation of patient's response to treatment, examination of patient, ordering and review of laboratory studies, ordering and review of radiographic studies, ordering and performing treatments and interventions, pulse oximetry, re-evaluation of  patient's condition and review of old charts   I assumed direction of critical care for this patient from another provider in my specialty: no     Care discussed with: admitting provider       Medications Ordered in ED Medications  enoxaparin (LOVENOX) injection 40 mg (has no administration in time  range)  acetaminophen (TYLENOL) tablet 650 mg (has no administration in time range)    Or  acetaminophen (TYLENOL) suppository 650 mg (has no administration in time range)  senna-docusate (Senokot-S) tablet 1 tablet (has no administration in time range)  ondansetron (ZOFRAN) tablet 4 mg (has no administration in time range)    Or  ondansetron (ZOFRAN) injection 4 mg (has no administration in time range)  sodium chloride 0.9 % bolus 1,000 mL (0 mLs Intravenous Stopped 08/27/23 0203)    ED Course/ Medical Decision Making/ A&P                                 Medical Decision Making Amount and/or Complexity of Data Reviewed Labs: ordered. Radiology: ordered.  Risk Decision regarding hospitalization.   This patient presents to the ED for concern of fall, this involves an extensive number of treatment options, and is a complaint that carries with it a high risk of complications and morbidity.  The differential diagnosis includes intracranial bleed, thoracic/abdominal trauma, orthopedic injury    Additional history obtained:  Additional history obtained from N/A External records from outside source obtained and reviewed including recent ER notes   Co morbidities that complicate the patient evaluation  DVTs currently on Eliquis  Social Determinants of Health:  Geriatric    Lab Tests:  I Ordered, and personally interpreted labs.  The pertinent results include: CBC shows normocytic anemia hemoglobin 10.5, BNP reveals sodium 115 chloride 88 CO2 of 19 calcium 8.1, prothrombin time 17.5 INR 1.4 UA is unremarkable TSH 1.2 sodium urine random 56   Imaging Studies ordered:  I  ordered imaging studies including chest x-ray, pelvis x-ray, plain film of the right elbow, CT head, C-spine, I independently visualized and interpreted imaging which showed CT imaging unremarkable, x-ray of the right elbow reveals probable radial head fracture I agree with the radiologist interpretation   Cardiac Monitoring:  The patient was maintained on a cardiac monitor.  I personally viewed and interpreted the cardiac monitored which showed an underlying rhythm of: Without signs of ischemia   Medicines ordered and prescription drug management:  I ordered medication including fluids I have reviewed the patients home medicines and have made adjustments as needed  Critical Interventions:  Trauma fall on thinners will obtain trauma scans Hyponatremia-will provide fluid resuscitation   Reevaluation:  Lab work reveals hyponatremia, has a history of SIADH, will provide him with gentle fluid resuscitation.  Radial head fracture, suspect this is acute, tender my exam, will place in a sling, small skin tear on his arm, does not require suturing or or Steri-Strip at this time.   Patient is remained stable, will admit to medicine for hyponatremia  Consultations Obtained:  I requested consultation with the Dr. Joneen Roach,  and discussed lab and imaging findings as well as pertinent plan - they recommend: Will admit the patient    Test Considered:  N/A    Rule out low suspicion for intracranial head bleed as patient denies loss of conscious, is not on anticoagulant no focal deficits present on my exam.  Low suspicion for spinal cord abnormality or spinal fracture spine was palpated was nontender to palpation, patient has full range of motion in the upper and lower extremities ct c spine is negative.  Doubt thoracic/abdominal trauma both as both were nontender to palpation there is no evidence of trauma on my exam low suspicion for orthopedic injury as imaging is negative  for acute  findings.     Dispostion and problem list  After consideration of the diagnostic results and the patients response to treatment, I feel that the patent would benefit from admission.  Hyponatremia-acute on chronic, patient need gentle fluid resuscitation continue monitoring Right radial head fracture-currently in a sling will need outpatient follow-up CT imaging reveals interval worsening of known unhealed, negated type II dens fracture, I doubt patient needs emergent neurosurgery evaluation she has no focal deficits my exam will likely further observation MRI            Final Clinical Impression(s) / ED Diagnoses Final diagnoses:  Fall, initial encounter  Closed nondisplaced fracture of head of right radius, initial encounter  Hyponatremia    Rx / DC Orders ED Discharge Orders     None         Carroll Sage, PA-C 08/27/23 0356    Glynn Octave, MD 08/27/23 [phone]

## 2023-08-27 NOTE — H&P (Signed)
PCP:   Marcus Presto, MD   Chief Complaint:  Fall  HPI: This is a 89 year old male with resident of nursing home.  His past medical history significant for SIADH, HTN, HLD, history of DVTs.   He was sent in after a mechanical fall.  It was an unwitnessed fall, he states he did not hit his head and he had no loss of consciousness.  He had some skin tears, he was sent to the ER.  In the ER presenting BP 178/64, HR 67.  Sodium 115, baseline 132 (done 04/09/2023).  UA and CXR normal CT spine w/o shows mildly worsened anterolisthesis of C1 and C2 in relation to the basion. Interval worsened distraction of a known unhealed comminuted type 2 dens fracture. Recommend MRI cervical spine to further evaluate acuity and associated ligaments. X-ray right elbow shows Small radial head fracture of indeterminate age. Correlation with physical examination is recommended to determine the presence of point tenderness.  Review of Systems:  Per HPI  Past Medical History: Past Medical History:  Diagnosis Date   Bilateral hearing loss    COPD (chronic obstructive pulmonary disease) (HCC)    DVT (deep venous thrombosis) (HCC)    GERD (gastroesophageal reflux disease)    History of pulmonary embolus (PE)    HLD (hyperlipidemia)    HTN (hypertension)    Melanoma (HCC)    NSTEMI (non-ST elevated myocardial infarction) (HCC)    OSA (obstructive sleep apnea)    Peripheral neuropathy    Squamous cell skin cancer    Past Surgical History:  Procedure Laterality Date   BIOPSY  11/07/2021   Procedure: BIOPSY;  Surgeon: Sherrilyn Rist, MD;  Location: [facility] ENDOSCOPY;  Service: Gastroenterology;;   ESOPHAGOGASTRODUODENOSCOPY (EGD) WITH PROPOFOL N/A 11/07/2021   Procedure: ESOPHAGOGASTRODUODENOSCOPY (EGD) WITH PROPOFOL;  Surgeon: Sherrilyn Rist, MD;  Location: [facility] ENDOSCOPY;  Service: Gastroenterology;  Laterality: N/A;   PERCUTANEOUS CORONARY STENT INTERVENTION (PCI-S)     RCA    Medications: Prior to  Admission medications   Medication Sig Start Date End Date Taking? Authorizing Provider  acetaminophen (TYLENOL) 500 MG tablet Take 1,000 mg by mouth every 8 (eight) hours as needed for fever.    [provider]  Aclidinium Bromide (TUDORZA PRESSAIR) 400 MCG/ACT AEPB Inhale 1 puff into the lungs in the morning and at bedtime. 01/12/23   Levin Erp, MD  albuterol (VENTOLIN HFA) 108 (90 Base) MCG/ACT inhaler Inhale 2 puffs into the lungs every 6 (six) hours as needed for wheezing or shortness of breath.    [provider]  apixaban (ELIQUIS) 5 MG TABS tablet Take 1 tablet (5 mg total) by mouth 2 (two) times daily. 01/22/23   Levin Erp, MD  ascorbic acid (VITAMIN C) 500 MG tablet Take 500 mg by mouth daily.    [provider]  aspirin EC 81 MG tablet Take 81 mg by mouth daily.    [provider]  atorvastatin (LIPITOR) 40 MG tablet Take 0.5 tablets (20 mg total) by mouth daily. 01/22/23   Levin Erp, MD  Brinzolamide-Brimonidine [facility]) 1-0.2 % SUSP Place 1 drop into both eyes 2 (two) times daily.    [provider]  Calcium Carb-Cholecalciferol (CALCIUM 500 +D) 500-10 MG-MCG TABS Take 1 tablet by mouth daily.    [provider]  carboxymethylcellulose (REFRESH TEARS) 0.5 % SOLN Place 1 drop into both eyes 4 (four) times daily.    [provider]  cephALEXin (KEFLEX) 500 MG capsule Take  1 capsule (500 mg total) by mouth 2 (two) times daily. 05/29/23   Small, Brooke L, PA  cholecalciferol 25 MCG (1000 UT) tablet Take 1,000 Units by mouth daily.    [provider]  docusate sodium (COLACE) 100 MG capsule Take 100 mg by mouth 2 (two) times daily.    [provider]  ferrous sulfate 325 (65 FE) MG EC tablet Take 325 mg by mouth every Monday, Wednesday, and Friday.    [provider]  fluticasone-salmeterol (WIXELA INHUB) 250-50 MCG/ACT AEPB Inhale 1 puff into the lungs in the morning and at bedtime.     [provider]  furosemide (LASIX) 20 MG tablet Take 20 mg by mouth daily as needed (Overload: Weight gain of 3 pounds, 5 pounds, weight gain over a week, shortness of breath and/or swelling).    [provider]  gabapentin (NEURONTIN) 300 MG capsule Take 1 capsule (300 mg total) by mouth 2 (two) times daily. 01/12/23   Levin Erp, MD  ipratropium-albuterol (DUONEB) 0.5-2.5 (3) MG/3ML SOLN Take 3 mLs by nebulization every 6 (six) hours as needed (for shortness of breath and wheezing).    [provider]  latanoprost (XALATAN) 0.005 % ophthalmic solution Place 1 drop into both eyes at bedtime.    [provider]  lisinopril (ZESTRIL) 10 MG tablet Take 5 mg by mouth daily. Hold for SBP less than 100.    [provider]  lisinopril (ZESTRIL) 5 MG tablet Take 1 tablet (5 mg total) by mouth daily. Patient not taking: Reported on 05/29/2023 05/19/23 06/18/23  Berton Mount I, MD  Lutein 10 MG TABS Take 10 mg by mouth in the morning.    [provider]  Multiple Vitamin (MULTIVITAMIN) tablet Take 1 tablet by mouth daily. Multivitamin + Folic acid 400 mcg.    [provider]  nystatin powder Apply 1 Application topically every 12 (twelve) hours as needed (irritation).    [provider]  pantoprazole (PROTONIX) 40 MG tablet Take 1 tablet (40 mg total) by mouth 2 (two) times daily before a meal. Patient taking differently: Take 40 mg by mouth 2 (two) times daily. 11/08/21   Merrilyn Puma, MD  polyethylene glycol (MIRALAX / GLYCOLAX) 17 g packet Take 17 g by mouth See admin instructions. 17 grams on Monday, Wednesday, Friday, one dose as needed for constipation.    [provider]  Probiotic Product (RISA-BID PROBIOTIC) TABS Take 1 tablet by mouth in the morning.    [provider]  sodium chloride 1 g tablet Take 1 tablet (1 g total) by mouth 3 (three) times daily with meals. 11/08/21   Merrilyn Puma, MD   tamsulosin (FLOMAX) 0.4 MG CAPS capsule Take 2 capsules (0.8 mg total) by mouth at bedtime. 01/22/23   Levin Erp, MD  traZODone (DESYREL) 50 MG tablet Take 0.5 tablets (25 mg total) by mouth at bedtime. 01/12/23   Levin Erp, MD    Allergies:   Allergies  Allergen Reactions   Ticagrelor Other (See Comments)    GI Hemorrhage   Coffea Arabica Other (See Comments)    DECAF coffee causes vision changes  DECAF coffee causes vision changes per pt.   Simvastatin Other (See Comments)    Myalgias   Tetanus Toxoids Other (See Comments)    "Horse serum only" Unknown reaction Documented on MAR    Spiriva Respimat [Tiotropium Bromide Monohydrate] Other (See Comments)    Urinary retention    Niacin Other (See Comments)  Flushing  [facility] records but patient can not remember exact reaction    Social History:  reports that he quit smoking about 44 years ago. His smoking use included cigarettes. He has never used smokeless tobacco. He reports that he does not currently use alcohol. He reports that he does not use drugs.  Family History: Family History  Problem Relation Age of Onset   Cancer Mother     Physical Exam: Vitals:   08/26/23 2239 08/26/23 2240  BP: (!) 178/64   Pulse:  67  Resp: 18   Temp:  97.8 F (36.6 C)  TempSrc:  Oral  SpO2:  97%    General: A and O x 3, weak appearing male, no acute distress Eyes: Pink conjunctiva, no scleral icterus ENT: Moist oral mucosa, neck supple, no thyromegaly Lungs: CTA B/L, no wheeze, no crackles, no use of accessory muscles Cardiovascular: RRR, no regurgitation. No carotid bruits, no JVD Abdomen: soft, positive BS, NTND not an acute abdomen GU: not examined Neuro: CN II - XII grossly intact, sensation intact Musculoskeletal: strength 5/5 all extremities.  Positive 2+ B/L LE edema.  Point tenderness right elbow Skin: Skin tears right lateral epicondyle. Psych: appropriate patient   Labs on Admission:  Recent Labs     08/26/23 2245  NA 115*  K 4.8  CL 88*  CO2 19*  GLUCOSE 92  BUN 21  CREATININE 0.86  CALCIUM 8.1*    Recent Labs    08/26/23 2245  WBC 7.3  NEUTROABS 3.7  HGB 10.5*  HCT 30.0*  MCV 89.6  PLT 261    Radiological Exams on Admission: CT Head Wo Contrast  Result Date: 08/27/2023 CLINICAL DATA:  Head trauma, minor (Age >= 65y); Neck trauma (Age >= 65y) Unwitnessed fall EXAM: CT HEAD WITHOUT CONTRAST CT CERVICAL SPINE WITHOUT CONTRAST TECHNIQUE: Multidetector CT imaging of the head and cervical spine was performed following the standard protocol without intravenous contrast. Multiplanar CT image reconstructions of the cervical spine were also generated. RADIATION DOSE REDUCTION: This exam was performed according to the departmental dose-optimization program which includes automated exposure control, adjustment of the mA and/or kV according to patient size and/or use of iterative reconstruction technique. COMPARISON:  CT head and C-spine 05/29/2023 FINDINGS: CT HEAD FINDINGS Brain: Cerebral ventricle sizes are concordant with the degree of cerebral volume loss. Patchy and confluent areas of decreased attenuation are noted throughout the deep and periventricular white matter of the cerebral hemispheres bilaterally, compatible with chronic microvascular ischemic disease. No evidence of large-territorial acute infarction. No parenchymal hemorrhage. No mass lesion. No extra-axial collection. No mass effect or midline shift. No hydrocephalus. Basilar cisterns are patent. Vascular: No hyperdense vessel. Atherosclerotic calcifications are present within the cavernous internal carotid and vertebral arteries. Skull: No acute fracture or focal lesion. Sinuses/Orbits: Right ethmoid sinus mucosal thickening. Paranasal sinuses and mastoid air cells are clear. The orbits are unremarkable. Other: None. CT CERVICAL SPINE FINDINGS Alignment: Mildly worsened anterolisthesis of C1 and C2 in relation to the  basion. Stable grade 1 anterolisthesis of C3 on C4. 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 Interval worsened distraction of a known unhealed comminuted type 2 dens fracture. Anterior cervical discectomy and fusion of the C5-C6 level. At least moderate osseous neural foraminal stenosis at the C3-C4 level. No severe osseous central canal stenosis. No aggressive appearing focal osseous lesion or focal pathologic process. Soft tissues and spinal canal: No prevertebral fluid or swelling. No visible canal hematoma. Upper  chest: Partially visualized right apical bullous changes. Other: None. IMPRESSION: 1. No acute intracranial abnormality. 2. Mildly worsened anterolisthesis of C1 and C2 in relation to the basion. Interval worsened distraction of a known unhealed comminuted type 2 dens fracture. Recommend MRI cervical spine to further evaluate acuity and associated ligaments. 3. 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 Electronically Signed   By: Tish Frederickson M.D.   On: 08/27/2023 01:30   CT Cervical Spine Wo Contrast  Result Date: 08/27/2023 CLINICAL DATA:  Head trauma, minor (Age >= 65y); Neck trauma (Age >= 65y) Unwitnessed fall EXAM: CT HEAD WITHOUT CONTRAST CT CERVICAL SPINE WITHOUT CONTRAST TECHNIQUE: Multidetector CT imaging of the head and cervical spine was performed following the standard protocol without intravenous contrast. Multiplanar CT image reconstructions of the cervical spine were also generated. RADIATION DOSE REDUCTION: This exam was performed according to the departmental dose-optimization program which includes automated exposure control, adjustment of the mA and/or kV according to patient size and/or use of iterative reconstruction technique. COMPARISON:  CT head and C-spine 05/29/2023 FINDINGS: CT HEAD FINDINGS Brain: Cerebral ventricle sizes are concordant with the degree of cerebral volume loss. Patchy and confluent  areas of decreased attenuation are noted throughout the deep and periventricular white matter of the cerebral hemispheres bilaterally, compatible with chronic microvascular ischemic disease. No evidence of large-territorial acute infarction. No parenchymal hemorrhage. No mass lesion. No extra-axial collection. No mass effect or midline shift. No hydrocephalus. Basilar cisterns are patent. Vascular: No hyperdense vessel. Atherosclerotic calcifications are present within the cavernous internal carotid and vertebral arteries. Skull: No acute fracture or focal lesion. Sinuses/Orbits: Right ethmoid sinus mucosal thickening. Paranasal sinuses and mastoid air cells are clear. The orbits are unremarkable. Other: None. CT CERVICAL SPINE FINDINGS Alignment: Mildly worsened anterolisthesis of C1 and C2 in relation to the basion. Stable grade 1 anterolisthesis of C3 on C4. 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 Interval worsened distraction of a known unhealed comminuted type 2 dens fracture. Anterior cervical discectomy and fusion of the C5-C6 level. At least moderate osseous neural foraminal stenosis at the C3-C4 level. No severe osseous central canal stenosis. No aggressive appearing focal osseous lesion or focal pathologic process. Soft tissues and spinal canal: No prevertebral fluid or swelling. No visible canal hematoma. Upper chest: Partially visualized right apical bullous changes. Other: None. IMPRESSION: 1. No acute intracranial abnormality. 2. Mildly worsened anterolisthesis of C1 and C2 in relation to the basion. Interval worsened distraction of a known unhealed comminuted type 2 dens fracture. Recommend MRI cervical spine to further evaluate acuity and associated ligaments. 3. 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 Electronically Signed   By: Tish Frederickson M.D.   On: 08/27/2023 01:30   DG Elbow Complete Right  Result Date:  08/26/2023 CLINICAL DATA:  Unwitnessed fall. EXAM: RIGHT ELBOW - COMPLETE 3+ VIEW COMPARISON:  None Available. FINDINGS: A small area of cortical irregularity of indeterminate age is seen involving the right radial head. There is no evidence of dislocation. A radiopaque fixation plate and screws are seen overlying the mid right ulnar shaft. There is mild posterior soft tissue swelling. IMPRESSION: Small radial head fracture of indeterminate age. Correlation with physical examination is recommended to determine the presence of point tenderness. Electronically Signed   By: Aram Candela M.D.   On: 08/26/2023 23:45   DG Pelvis Portable  Result Date: 08/26/2023 CLINICAL DATA:  Unwitnessed fall. EXAM: PORTABLE PELVIS 1-2 VIEWS COMPARISON:  May 16, 2023 FINDINGS: There is no evidence of an acute pelvic fracture or diastasis. Degenerative changes seen involving both hips, in the form of joint space narrowing, acetabular sclerosis and lateral acetabular bony spurring. No pelvic bone lesions are seen. IMPRESSION: Degenerative changes without evidence of an acute osseous abnormality. Electronically Signed   By: Aram Candela M.D.   On: 08/26/2023 23:42   DG Chest Portable 1 View  Result Date: 08/26/2023 CLINICAL DATA:  Unwitnessed fall. EXAM: PORTABLE CHEST 1 VIEW COMPARISON:  May 15, 2023 FINDINGS: The heart size and mediastinal contours are within normal limits. There is marked severity calcification of the thoracic aorta. Mild, chronic appearing increased lung markings are seen with a stable cavitary lesion seen within the medial aspect of the right upper lobe. Mild atelectasis is seen within the bilateral lung bases, left greater than right. No pleural effusion or pneumothorax is identified. Multilevel degenerative changes are seen throughout the thoracic spine. IMPRESSION: Stable right upper lobe cavitary lesion with mild bibasilar atelectasis, left greater than right. Electronically Signed   By:  Aram Candela M.D.   On: 08/26/2023 23:40    Assessment/Plan Present on Admission:  Hyponatremia  SIADH (syndrome of inappropriate ADH production) (HCC) -Urine sodium, urine and plasma osmolality ordered. -Fluid restrict 1500 cc/day -BMP in a.m. and at noon -Sodium tablets 3 times daily resumed   Fall, initial encounter -PT/OT consult placed   Small radial fracture -Age-indeterminate -Sling ordered  Unhealed comminuted T2 dens fracture noted on CT neck Mildly worsening C1/C2 anterolisthesis noted on CT neck -Nontender on palpation -MRI C-spine ordered   COPD -Nebulizers as needed -Fluticasone-salmeterol inhaler resumed.   HTN -Lisinopril resumed   HLD -Atorvastatin resumed   DVT stable -Patient maintained on Eliquis, resumed   RUQ lobe cavitary lesion -Stable, continue to monitor outpatient   C1c2 -  mri ordered by EDP. No symptoms. nointender  Rayshad Riviello 08/27/2023, 2:33 AM

## 2023-08-27 NOTE — TOC CAGE-AID Note (Signed)
Transition of Care [facility]) - CAGE-AID Screening  Patient Details  Name: Marcus Martin MRN: [id number] Date of Birth: 06/15/1933  Clinical Narrative:  Patient admitted after a fall from SNF. Patient confused, DNR comfort. Patient unable to participate in screening at this time.  CAGE-AID Screening: Substance Abuse Screening unable to be completed due to: : Patient unable to participate

## 2023-08-27 NOTE — Progress Notes (Signed)
Orthopedic Tech Progress Note Patient Details:  Roxanne Dyck Aug 30, 1933 [id number]  Ortho Devices Type of Ortho Device: Arm sling Ortho Device/Splint Interventions: Ordered  I spoke with the dr to clarify if they want an arm sling they agreed. I delivered it to the room as the rn didn't want me to wake the patient.    Trinna Post 08/27/2023, 5:46 AM

## 2023-08-27 NOTE — ED Notes (Signed)
Daren(son) updated on pt's current room assignment.

## 2023-08-27 NOTE — ED Notes (Addendum)
ED TO INPATIENT HANDOFF REPORT  ED Nurse Name and Phone #:  Theophilus Bones [phone]  S Name/Age/Gender Marcus Martin 89 y.o. male Room/Bed: 043C/043C  Code Status   Code Status: Do not attempt resuscitation (DNR) - Comfort care  Home/SNF/Other Home Patient oriented to: self Is this baseline? Unable to determine baseline. Pt disoriented to self and place at this time.  Triage Complete: Triage complete  Chief Complaint Hyponatremia [E87.1]  Triage Note Pt here from Nursing home with c/o unwitnessed fall , has been falling a lot recently , no loc is on thinners but did not hit head , pt has new lac to right elbow and old lac to left forearm     Allergies Allergies  Allergen Reactions   Ticagrelor Other (See Comments)    GI Hemorrhage   Coffea Arabica Other (See Comments)    DECAF coffee causes vision changes  DECAF coffee causes vision changes per pt.   Simvastatin Other (See Comments)    Myalgias   Tetanus Toxoids Other (See Comments)    "Horse serum only" Unknown reaction Documented on MAR    Spiriva Respimat [Tiotropium Bromide Monohydrate] Other (See Comments)    Urinary retention    Niacin Other (See Comments)    Flushing  [facility] but patient can not remember exact reaction    Level of Care/Admitting Diagnosis ED Disposition     ED Disposition  Admit   Condition  --   Comment  Hospital Area: [facility] [[id number]]  Level of Care: Telemetry Medical [[id number]]  May admit patient to [facility] or [facility] if equivalent level of care is available:: Yes  Covid Evaluation: Asymptomatic - no recent exposure (last 10 days) testing not required  Diagnosis: Hyponatremia [198519]  Admitting Physician: Dorcas Carrow [[id number]]  Attending Physician: Dorcas Carrow [[id number]]  Certification:: I certify this patient will need inpatient services for at least 2 midnights          B Medical/Surgery History Past Medical History:  Diagnosis Date    Bilateral hearing loss    COPD (chronic obstructive pulmonary disease) (HCC)    DVT (deep venous thrombosis) (HCC)    GERD (gastroesophageal reflux disease)    History of pulmonary embolus (PE)    HLD (hyperlipidemia)    HTN (hypertension)    Melanoma (HCC)    NSTEMI (non-ST elevated myocardial infarction) (HCC)    OSA (obstructive sleep apnea)    Peripheral neuropathy    Squamous cell skin cancer    Past Surgical History:  Procedure Laterality Date   BIOPSY  11/07/2021   Procedure: BIOPSY;  Surgeon: Sherrilyn Rist, MD;  Location: [facility] ENDOSCOPY;  Service: Gastroenterology;;   ESOPHAGOGASTRODUODENOSCOPY (EGD) WITH PROPOFOL N/A 11/07/2021   Procedure: ESOPHAGOGASTRODUODENOSCOPY (EGD) WITH PROPOFOL;  Surgeon: Sherrilyn Rist, MD;  Location: [facility] ENDOSCOPY;  Service: Gastroenterology;  Laterality: N/A;   PERCUTANEOUS CORONARY STENT INTERVENTION (PCI-S)     RCA     A IV Location/Drains/Wounds Patient Lines/Drains/Airways Status     Active Line/Drains/Airways     Name Placement date Placement time Site Days   Peripheral IV 08/26/23 20 G Anterior;Right Forearm 08/26/23  2247  Forearm  1            Intake/Output Last 24 hours No intake or output data in the 24 hours ending 08/27/23 0759  Labs/Imaging Results for orders placed or performed during the hospital encounter of 08/26/23 (from the past 48 hour(s))  CBC with Differential  Status: Abnormal   Collection Time: 08/26/23 10:45 PM  Result Value Ref Range   WBC 7.3 4.0 - 10.5 K/uL   RBC 3.35 (L) 4.22 - 5.81 MIL/uL   Hemoglobin 10.5 (L) 13.0 - 17.0 g/dL   HCT 87.5 (L) 64.3 - 32.9 %   MCV 89.6 80.0 - 100.0 fL   MCH 31.3 26.0 - 34.0 pg   MCHC 35.0 30.0 - 36.0 g/dL   RDW 51.8 84.1 - 66.0 %   Platelets 261 150 - 400 K/uL   nRBC 0.0 0.0 - 0.2 %   Neutrophils Relative % 51 %   Neutro Abs 3.7 1.7 - 7.7 K/uL   Lymphocytes Relative 32 %   Lymphs Abs 2.4 0.7 - 4.0 K/uL   Monocytes Relative 13 %   Monocytes Absolute  1.0 0.1 - 1.0 K/uL   Eosinophils Relative 3 %   Eosinophils Absolute 0.2 0.0 - 0.5 K/uL   Basophils Relative 1 %   Basophils Absolute 0.1 0.0 - 0.1 K/uL   Immature Granulocytes 0 %   Abs Immature Granulocytes 0.02 0.00 - 0.07 K/uL    Comment: Performed at [facility], [address], [place], [place] [address]  Basic metabolic panel     Status: Abnormal   Collection Time: 08/26/23 10:45 PM  Result Value Ref Range   Sodium 115 (LL) 135 - 145 mmol/L    Comment: REPEATED TO VERIFY CRITICAL RESULT CALLED TO, READ BACK BY AND VERIFIED WITH GROSE,C RN 0109 08/27/23 AMIREHSANI F    Potassium 4.8 3.5 - 5.1 mmol/L   Chloride 88 (L) 98 - 111 mmol/L   CO2 19 (L) 22 - 32 mmol/L   Glucose, Bld 92 70 - 99 mg/dL    Comment: Glucose reference range applies only to samples taken after fasting for at least 8 hours.   BUN 21 8 - 23 mg/dL   Creatinine, Ser 3.23 0.61 - 1.24 mg/dL   Calcium 8.1 (L) 8.9 - 10.3 mg/dL   GFR, Estimated >55 >73 mL/min    Comment: (NOTE) Calculated using the CKD-EPI Creatinine Equation (2021)    Anion gap 8 5 - 15    Comment: Performed at [facility], [address], [place], [place] [address]  Protime-INR     Status: Abnormal   Collection Time: 08/26/23 10:45 PM  Result Value Ref Range   Prothrombin Time 17.5 (H) 11.4 - 15.2 seconds   INR 1.4 (H) 0.8 - 1.2    Comment: (NOTE) INR goal varies based on device and disease states. Performed at [facility], [address] [place], [place] [address]   TSH     Status: None   Collection Time: 08/26/23 10:45 PM  Result Value Ref Range   TSH 1.263 0.350 - 4.500 uIU/mL    Comment: Performed by a 3rd Generation assay with a functional sensitivity of <=0.01 uIU/mL. Performed at [facility], [address] [place], [place] [address]   Urinalysis, Routine w reflex microscopic -Urine, Clean Catch     Status: None   Collection Time: 08/27/23  1:40 AM  Result Value Ref Range   Color, Urine YELLOW  YELLOW   APPearance CLEAR CLEAR   Specific Gravity, Urine 1.008 1.005 - 1.030   pH 7.0 5.0 - 8.0   Glucose, UA NEGATIVE NEGATIVE mg/dL   Hgb urine dipstick NEGATIVE NEGATIVE   Bilirubin Urine NEGATIVE NEGATIVE   Ketones, ur NEGATIVE NEGATIVE mg/dL   Protein, ur NEGATIVE  NEGATIVE mg/dL   Nitrite NEGATIVE NEGATIVE   Leukocytes,Ua NEGATIVE NEGATIVE    Comment: Performed at [facility], [address], [place], [place] [address]  Sodium, urine, random     Status: None   Collection Time: 08/27/23  1:40 AM  Result Value Ref Range   Sodium, Ur 56 mmol/L    Comment: Performed at [facility], [address], [place], [place] [address]  Osmolality, urine     Status: Abnormal   Collection Time: 08/27/23  1:40 AM  Result Value Ref Range   Osmolality, Ur 280 (L) 300 - 900 mOsm/kg    Comment: REPEATED TO VERIFY Performed at [facility], [address], [place], [place] [address]   Osmolality     Status: Abnormal   Collection Time: 08/27/23  4:56 AM  Result Value Ref Range   Osmolality 257 (L) 275 - 295 mOsm/kg    Comment: REPEATED TO VERIFY Performed at [facility], [address] [place], [place] [address]   Basic metabolic panel     Status: Abnormal   Collection Time: 08/27/23  4:56 AM  Result Value Ref Range   Sodium 119 (LL) 135 - 145 mmol/L    Comment: CRITICAL RESULT CALLED TO, READ BACK BY AND VERIFIED WITH PATTERSON,N RN [phone] 08/27/23 AMIREHSANI F   Potassium 4.7 3.5 - 5.1 mmol/L   Chloride 91 (L) 98 - 111 mmol/L   CO2 17 (L) 22 - 32 mmol/L   Glucose, Bld 94 70 - 99 mg/dL    Comment: Glucose reference range applies only to samples taken after fasting for at least 8 hours.   BUN 16 8 - 23 mg/dL   Creatinine, Ser 3.50 0.61 - 1.24 mg/dL   Calcium 8.3 (L) 8.9 - 10.3 mg/dL   GFR, Estimated >09 >38 mL/min    Comment: (NOTE) Calculated using the CKD-EPI Creatinine Equation (2021)    Anion gap 11 5 - 15    Comment: Performed at [facility],  [address] [place], [place] [address]   CT Head Wo Contrast  Result Date: 08/27/2023 CLINICAL DATA:  Head trauma, minor (Age >= 65y); Neck trauma (Age >= 65y) Unwitnessed fall EXAM: CT HEAD WITHOUT CONTRAST CT CERVICAL SPINE WITHOUT CONTRAST TECHNIQUE: Multidetector CT imaging of the head and cervical spine was performed following the standard protocol without intravenous contrast. Multiplanar CT image reconstructions of the cervical spine were also generated. RADIATION DOSE REDUCTION: This exam was performed according to the departmental dose-optimization program which includes automated exposure control, adjustment of the mA and/or kV according to patient size and/or use of iterative reconstruction technique. COMPARISON:  CT head and C-spine 05/29/2023 FINDINGS: CT HEAD FINDINGS Brain: Cerebral ventricle sizes are concordant with the degree of cerebral volume loss. Patchy and confluent areas of decreased attenuation are noted throughout the deep and periventricular white matter of the cerebral hemispheres bilaterally, compatible with chronic microvascular ischemic disease. No evidence of large-territorial acute infarction. No parenchymal hemorrhage. No mass lesion. No extra-axial collection. No mass effect or midline shift. No hydrocephalus. Basilar cisterns are patent. Vascular: No hyperdense vessel. Atherosclerotic calcifications are present within the cavernous internal carotid and vertebral arteries. Skull: No acute fracture or focal lesion. Sinuses/Orbits: Right ethmoid sinus mucosal thickening. Paranasal sinuses and mastoid air cells are clear. The orbits are unremarkable. Other: None. CT CERVICAL SPINE FINDINGS Alignment: Mildly worsened anterolisthesis of C1 and C2 in relation to the basion. Stable grade 1 anterolisthesis of  C3 on C4. 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 Interval worsened distraction of a known unhealed comminuted type 2 dens  fracture. Anterior cervical discectomy and fusion of the C5-C6 level. At least moderate osseous neural foraminal stenosis at the C3-C4 level. No severe osseous central canal stenosis. No aggressive appearing focal osseous lesion or focal pathologic process. Soft tissues and spinal canal: No prevertebral fluid or swelling. No visible canal hematoma. Upper chest: Partially visualized right apical bullous changes. Other: None. IMPRESSION: 1. No acute intracranial abnormality. 2. Mildly worsened anterolisthesis of C1 and C2 in relation to the basion. Interval worsened distraction of a known unhealed comminuted type 2 dens fracture. Recommend MRI cervical spine to further evaluate acuity and associated ligaments. 3. 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 Electronically Signed   By: Tish Frederickson M.D.   On: 08/27/2023 01:30   CT Cervical Spine Wo Contrast  Result Date: 08/27/2023 CLINICAL DATA:  Head trauma, minor (Age >= 65y); Neck trauma (Age >= 65y) Unwitnessed fall EXAM: CT HEAD WITHOUT CONTRAST CT CERVICAL SPINE WITHOUT CONTRAST TECHNIQUE: Multidetector CT imaging of the head and cervical spine was performed following the standard protocol without intravenous contrast. Multiplanar CT image reconstructions of the cervical spine were also generated. RADIATION DOSE REDUCTION: This exam was performed according to the departmental dose-optimization program which includes automated exposure control, adjustment of the mA and/or kV according to patient size and/or use of iterative reconstruction technique. COMPARISON:  CT head and C-spine 05/29/2023 FINDINGS: CT HEAD FINDINGS Brain: Cerebral ventricle sizes are concordant with the degree of cerebral volume loss. Patchy and confluent areas of decreased attenuation are noted throughout the deep and periventricular white matter of the cerebral hemispheres bilaterally, compatible with chronic microvascular ischemic disease. No evidence of  large-territorial acute infarction. No parenchymal hemorrhage. No mass lesion. No extra-axial collection. No mass effect or midline shift. No hydrocephalus. Basilar cisterns are patent. Vascular: No hyperdense vessel. Atherosclerotic calcifications are present within the cavernous internal carotid and vertebral arteries. Skull: No acute fracture or focal lesion. Sinuses/Orbits: Right ethmoid sinus mucosal thickening. Paranasal sinuses and mastoid air cells are clear. The orbits are unremarkable. Other: None. CT CERVICAL SPINE FINDINGS Alignment: Mildly worsened anterolisthesis of C1 and C2 in relation to the basion. Stable grade 1 anterolisthesis of C3 on C4. 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 Interval worsened distraction of a known unhealed comminuted type 2 dens fracture. Anterior cervical discectomy and fusion of the C5-C6 level. At least moderate osseous neural foraminal stenosis at the C3-C4 level. No severe osseous central canal stenosis. No aggressive appearing focal osseous lesion or focal pathologic process. Soft tissues and spinal canal: No prevertebral fluid or swelling. No visible canal hematoma. Upper chest: Partially visualized right apical bullous changes. Other: None. IMPRESSION: 1. No acute intracranial abnormality. 2. Mildly worsened anterolisthesis of C1 and C2 in relation to the basion. Interval worsened distraction of a known unhealed comminuted type 2 dens fracture. Recommend MRI cervical spine to further evaluate acuity and associated ligaments. 3. 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 Electronically Signed   By: Tish Frederickson M.D.   On: 08/27/2023 01:30   DG Elbow Complete Right  Result Date: 08/26/2023 CLINICAL DATA:  Unwitnessed fall. EXAM: RIGHT ELBOW - COMPLETE 3+ VIEW COMPARISON:  None Available. FINDINGS: A small area of cortical irregularity of indeterminate age is seen involving the right radial  head. There is no evidence of dislocation.  A radiopaque fixation plate and screws are seen overlying the mid right ulnar shaft. There is mild posterior soft tissue swelling. IMPRESSION: Small radial head fracture of indeterminate age. Correlation with physical examination is recommended to determine the presence of point tenderness. Electronically Signed   By: Aram Candela M.D.   On: 08/26/2023 23:45   DG Pelvis Portable  Result Date: 08/26/2023 CLINICAL DATA:  Unwitnessed fall. EXAM: PORTABLE PELVIS 1-2 VIEWS COMPARISON:  May 16, 2023 FINDINGS: There is no evidence of an acute pelvic fracture or diastasis. Degenerative changes seen involving both hips, in the form of joint space narrowing, acetabular sclerosis and lateral acetabular bony spurring. No pelvic bone lesions are seen. IMPRESSION: Degenerative changes without evidence of an acute osseous abnormality. Electronically Signed   By: Aram Candela M.D.   On: 08/26/2023 23:42   DG Chest Portable 1 View  Result Date: 08/26/2023 CLINICAL DATA:  Unwitnessed fall. EXAM: PORTABLE CHEST 1 VIEW COMPARISON:  May 15, 2023 FINDINGS: The heart size and mediastinal contours are within normal limits. There is marked severity calcification of the thoracic aorta. Mild, chronic appearing increased lung markings are seen with a stable cavitary lesion seen within the medial aspect of the right upper lobe. Mild atelectasis is seen within the bilateral lung bases, left greater than right. No pleural effusion or pneumothorax is identified. Multilevel degenerative changes are seen throughout the thoracic spine. IMPRESSION: Stable right upper lobe cavitary lesion with mild bibasilar atelectasis, left greater than right. Electronically Signed   By: Aram Candela M.D.   On: 08/26/2023 23:40    Pending Labs Unresulted Labs (From admission, onward)     Start     Ordered   09/03/23 0500  Creatinine, serum  (enoxaparin (LOVENOX)    CrCl >/= 30 ml/min)  Weekly,    R     Comments: while on enoxaparin therapy    08/27/23 0308   08/27/23 0745  Magnesium  Once,   R        08/27/23 0744   08/27/23 0745  Phosphorus  Once,   R        08/27/23 0744   08/27/23 0743  Basic metabolic panel  Now then every 6 hours,   R (with TIMED occurrences)      08/27/23 0743            Vitals/Pain Today's Vitals   08/26/23 2239 08/26/23 2240 08/27/23 0400 08/27/23 0600  BP: (!) 178/64  131/63 (!) 146/55  Pulse:  67 72 71  Resp: 18  19 14   Temp:  97.8 F (36.6 C)    TempSrc:  Oral    SpO2:  97% 100% 97%  PainSc: 5        Isolation Precautions No active isolations  Medications Medications  enoxaparin (LOVENOX) injection 40 mg (has no administration in time range)  acetaminophen (TYLENOL) tablet 650 mg (has no administration in time range)    Or  acetaminophen (TYLENOL) suppository 650 mg (has no administration in time range)  senna-docusate (Senokot-S) tablet 1 tablet (has no administration in time range)  ondansetron (ZOFRAN) tablet 4 mg (has no administration in time range)    Or  ondansetron (ZOFRAN) injection 4 mg (has no administration in time range)  sodium chloride tablet 1 g (has no administration in time range)  sodium chloride 0.9 % bolus 1,000 mL (0 mLs Intravenous Stopped 08/27/23 0203)    Mobility walks     Focused Assessments Musculoskeletal   R Recommendations: See Admitting  Provider Note  Report given to:   Additional Notes: HIGH FALLS RISK

## 2023-08-27 NOTE — ED Notes (Signed)
Nursing home staff at [facility] updated.

## 2023-08-27 NOTE — ED Notes (Addendum)
Critical Lab Sodium-119 MD made aware.

## 2023-08-27 NOTE — ED Notes (Signed)
[employer] features editor (son) updated on pt's current ED status.

## 2023-08-28 DIAGNOSIS — E871 Hypo-osmolality and hyponatremia: Secondary | ICD-10-CM | POA: Diagnosis not present

## 2023-08-28 LAB — BASIC METABOLIC PANEL
Anion gap: 6 (ref 5–15)
Anion gap: 13 (ref 5–15)
BUN: 17 mg/dL (ref 8–23)
BUN: 17 mg/dL (ref 8–23)
CO2: 19 mmol/L — ABNORMAL LOW (ref 22–32)
CO2: 21 mmol/L — ABNORMAL LOW (ref 22–32)
Calcium: 8 mg/dL — ABNORMAL LOW (ref 8.9–10.3)
Calcium: 8.6 mg/dL — ABNORMAL LOW (ref 8.9–10.3)
Chloride: 96 mmol/L — ABNORMAL LOW (ref 98–111)
Chloride: 91 mmol/L — ABNORMAL LOW (ref 98–111)
Creatinine, Ser: 0.92 mg/dL (ref 0.61–1.24)
Creatinine, Ser: 0.87 mg/dL (ref 0.61–1.24)
GFR, Estimated: >60 mL/min (ref >60)
GFR, Estimated: >60 mL/min (ref >60)
Glucose, Bld: 96 mg/dL (ref 70–99)
Glucose, Bld: 105 mg/dL — ABNORMAL HIGH (ref 70–99)
Potassium: 4.2 mmol/L (ref 3.5–5.1)
Potassium: 4.3 mmol/L (ref 3.5–5.1)
Sodium: 121 mmol/L — ABNORMAL LOW (ref 135–145)
Sodium: 125 mmol/L — ABNORMAL LOW (ref 135–145)

## 2023-08-28 MED ORDER — ALUM & MAG HYDROXIDE-SIMETH 200-200-20 MG/5ML PO SUSP: 20.0000 mL | Freq: Four times a day (QID) | ORAL | Status: DC | PRN

## 2023-08-28 MED ORDER — ALBUTEROL SULFATE (2.5 MG/3ML) 0.083% IN NEBU: 2.5000 mg | INHALATION_SOLUTION | Freq: Four times a day (QID) | RESPIRATORY_TRACT | Status: DC | PRN

## 2023-08-28 MED ORDER — MOMETASONE FURO-FORMOTEROL FUM 200-5 MCG/ACT IN AERO
2.0000 | INHALATION_SPRAY | Freq: Two times a day (BID) | RESPIRATORY_TRACT | Status: DC
  Administered 2023-08-28 – 2023-08-29 (×3): 2 via RESPIRATORY_TRACT
  Filled 2023-08-28 (×2): qty 8.8

## 2023-08-28 MED ORDER — ACETAMINOPHEN 500 MG PO TABS: 1000.0000 mg | ORAL_TABLET | Freq: Three times a day (TID) | ORAL | Status: DC | PRN

## 2023-08-28 MED ORDER — TRAZODONE HCL 50 MG PO TABS
25.0000 mg | ORAL_TABLET | Freq: Every day | ORAL | Status: DC
  Administered 2023-08-28 – 2023-08-29 (×2): 25 mg via ORAL
  Filled 2023-08-28 (×2): qty 1

## 2023-08-28 MED ORDER — GABAPENTIN 300 MG PO CAPS
300.0000 mg | ORAL_CAPSULE | Freq: Two times a day (BID) | ORAL | Status: DC
  Administered 2023-08-28 – 2023-08-30 (×5): 300 mg via ORAL
  Filled 2023-08-28 (×5): qty 1

## 2023-08-28 MED ORDER — DOCUSATE SODIUM 100 MG PO CAPS
100.0000 mg | ORAL_CAPSULE | Freq: Two times a day (BID) | ORAL | Status: DC
  Administered 2023-08-28 – 2023-08-30 (×5): 100 mg via ORAL
  Filled 2023-08-28 (×5): qty 1

## 2023-08-28 MED ORDER — VITAMIN C 500 MG PO TABS
500.0000 mg | ORAL_TABLET | Freq: Every day | ORAL | Status: DC
  Administered 2023-08-28 – 2023-08-30 (×3): 500 mg via ORAL
  Filled 2023-08-28 (×3): qty 1

## 2023-08-28 MED ORDER — VITAMIN D 25 MCG (1000 UNIT) PO TABS
1000.0000 [IU] | ORAL_TABLET | Freq: Every day | ORAL | Status: DC
  Administered 2023-08-28 – 2023-08-30 (×3): 1000 [IU] via ORAL
  Filled 2023-08-28 (×3): qty 1

## 2023-08-28 MED ORDER — FERROUS SULFATE 325 (65 FE) MG PO TABS
325.0000 mg | ORAL_TABLET | ORAL | Status: DC
  Administered 2023-08-29: 325 mg via ORAL
  Filled 2023-08-28: qty 1

## 2023-08-28 MED ORDER — PANTOPRAZOLE SODIUM 40 MG PO TBEC
40.0000 mg | DELAYED_RELEASE_TABLET | Freq: Two times a day (BID) | ORAL | Status: DC
  Administered 2023-08-28 – 2023-08-30 (×5): 40 mg via ORAL
  Filled 2023-08-28 (×5): qty 1

## 2023-08-28 MED ORDER — POLYVINYL ALCOHOL 1.4 % OP SOLN
1.0000 [drp] | Freq: Three times a day (TID) | OPHTHALMIC | Status: DC
  Administered 2023-08-29 – 2023-08-30 (×5): 1 [drp] via OPHTHALMIC
  Filled 2023-08-28: qty 15

## 2023-08-28 MED ORDER — ADULT MULTIVITAMIN W/MINERALS CH
1.0000 | ORAL_TABLET | Freq: Every day | ORAL | Status: DC
  Administered 2023-08-28 – 2023-08-30 (×3): 1 via ORAL
  Filled 2023-08-28 (×3): qty 1

## 2023-08-28 MED ORDER — BACLOFEN 10 MG PO TABS: 5.0000 mg | ORAL_TABLET | Freq: Three times a day (TID) | ORAL | Status: DC | PRN

## 2023-08-28 MED ORDER — ATORVASTATIN CALCIUM 10 MG PO TABS
20.0000 mg | ORAL_TABLET | Freq: Every day | ORAL | Status: DC
  Administered 2023-08-28 – 2023-08-30 (×3): 20 mg via ORAL
  Filled 2023-08-28 (×3): qty 2

## 2023-08-28 MED ORDER — OYSTER SHELL CALCIUM/D3 500-5 MG-MCG PO TABS
1.0000 | ORAL_TABLET | Freq: Every day | ORAL | Status: DC
  Administered 2023-08-28 – 2023-08-30 (×3): 1 via ORAL
  Filled 2023-08-28 (×4): qty 1

## 2023-08-28 MED ORDER — TAMSULOSIN HCL 0.4 MG PO CAPS
0.8000 mg | ORAL_CAPSULE | Freq: Every day | ORAL | Status: DC
  Administered 2023-08-28 – 2023-08-29 (×2): 0.8 mg via ORAL
  Filled 2023-08-28 (×2): qty 2

## 2023-08-28 MED ORDER — IPRATROPIUM-ALBUTEROL 0.5-2.5 (3) MG/3ML IN SOLN: 3.0000 mL | Freq: Four times a day (QID) | RESPIRATORY_TRACT | Status: DC | PRN

## 2023-08-28 MED ORDER — POLYETHYLENE GLYCOL 3350 17 G PO PACK
17.0000 g | PACK | Freq: Every day | ORAL | Status: DC
  Administered 2023-08-28 – 2023-08-29 (×2): 17 g via ORAL
  Filled 2023-08-28 (×2): qty 1

## 2023-08-28 MED ORDER — LATANOPROST 0.005 % OP SOLN
1.0000 [drp] | Freq: Every day | OPHTHALMIC | Status: DC
  Administered 2023-08-29: 1 [drp] via OPHTHALMIC
  Filled 2023-08-28: qty 2.5

## 2023-08-28 NOTE — Plan of Care (Signed)
  Problem: Education: Goal: Knowledge of General Education information will improve Description: Including pain rating scale, medication(s)/side effects and non-pharmacologic comfort measures 08/28/2023 1737 by Barnet Pall, RN Outcome: Progressing 08/28/2023 1737 by Barnet Pall, RN Outcome: Progressing   Problem: Health Behavior/Discharge Planning: Goal: Ability to manage health-related needs will improve 08/28/2023 1737 by Barnet Pall, RN Outcome: Progressing 08/28/2023 1737 by Barnet Pall, RN Outcome: Progressing   Problem: Clinical Measurements: Goal: Ability to maintain clinical measurements within normal limits will improve 08/28/2023 1737 by Barnet Pall, RN Outcome: Progressing 08/28/2023 1737 by Barnet Pall, RN Outcome: Progressing Goal: Will remain free from infection 08/28/2023 1737 by Barnet Pall, RN Outcome: Progressing 08/28/2023 1737 by Barnet Pall, RN Outcome: Progressing Goal: Diagnostic test results will improve 08/28/2023 1737 by Barnet Pall, RN Outcome: Progressing 08/28/2023 1737 by Barnet Pall, RN Outcome: Progressing Goal: Respiratory complications will improve 08/28/2023 1737 by Barnet Pall, RN Outcome: Progressing 08/28/2023 1737 by Barnet Pall, RN Outcome: Progressing Goal: Cardiovascular complication will be avoided 08/28/2023 1737 by Barnet Pall, RN Outcome: Progressing 08/28/2023 1737 by Barnet Pall, RN Outcome: Progressing   Problem: Activity: Goal: Risk for activity intolerance will decrease 08/28/2023 1737 by Barnet Pall, RN Outcome: Progressing 08/28/2023 1737 by Barnet Pall, RN Outcome: Progressing   Problem: Nutrition: Goal: Adequate nutrition will be maintained 08/28/2023 1737 by Barnet Pall, RN Outcome: Progressing 08/28/2023 1737 by Barnet Pall, RN Outcome: Progressing   Problem: Coping: Goal: Level  of anxiety will decrease 08/28/2023 1737 by Barnet Pall, RN Outcome: Progressing 08/28/2023 1737 by Barnet Pall, RN Outcome: Progressing   Problem: Elimination: Goal: Will not experience complications related to bowel motility 08/28/2023 1737 by Barnet Pall, RN Outcome: Progressing 08/28/2023 1737 by Barnet Pall, RN Outcome: Progressing Goal: Will not experience complications related to urinary retention 08/28/2023 1737 by Barnet Pall, RN Outcome: Progressing 08/28/2023 1737 by Barnet Pall, RN Outcome: Progressing   Problem: Pain Managment: Goal: General experience of comfort will improve 08/28/2023 1737 by Barnet Pall, RN Outcome: Progressing 08/28/2023 1737 by Barnet Pall, RN Outcome: Progressing   Problem: Safety: Goal: Ability to remain free from injury will improve 08/28/2023 1737 by Barnet Pall, RN Outcome: Progressing 08/28/2023 1737 by Barnet Pall, RN Outcome: Progressing   Problem: Skin Integrity: Goal: Risk for impaired skin integrity will decrease 08/28/2023 1737 by Barnet Pall, RN Outcome: Progressing 08/28/2023 1737 by Barnet Pall, RN Outcome: Progressing

## 2023-08-28 NOTE — Evaluation (Signed)
Occupational Therapy Evaluation Patient Details Name: Marcus Martin MRN: [id number] DOB: 03-02-1933 Today's Date: 08/28/2023   History of Present Illness 89 yo male admitted 9/22 from [facility] after fall. PMhx:HOH, SIADH and chronic hyponatremia, HTN, HLD, DVT, COPD, CAD, OSA, C2 fx   Clinical Impression   Marcus Martin was evaluated s/p the above admission list. He is from [facility], he reports staff assists with ADLs, he ambulates with RW at baseline, he reports 2 recent falls. Upon evaluation the pt was limited by impaired cognition, confusion, weakness, unsteady gait and poor activity tolerance. Overall he needed min A to stand and close CGA for short mobility, requesting to sit at the sink for grooming tasks. Due to the deficits listed below the pt also needs up to max A for LB ADLs and min A for UB ADLs. Pt is a high fall risk at this time. Pt will benefit from continued acute OT services and skilled inpatient follow up therapy, <3 hours/day at current facility if available.         If plan is discharge home, recommend the following: A lot of help with walking and/or transfers;A lot of help with bathing/dressing/bathroom;Assistance with cooking/housework;Direct supervision/assist for medications management;Direct supervision/assist for financial management;Assist for transportation;Help with stairs or ramp for entrance    Functional Status Assessment  Patient has had a recent decline in their functional status and demonstrates the ability to make significant improvements in function in a reasonable and predictable amount of time.  Equipment Recommendations  None recommended by OT       Precautions / Restrictions Precautions Precautions: Fall Precaution Comments: HOH Restrictions Weight Bearing Restrictions: No      Mobility Bed Mobility               General bed mobility comments: OOB upon arrival    Transfers Overall transfer level: Needs  assistance Equipment used: Rolling walker (2 wheels) Transfers: Sit to/from Stand Sit to Stand: Min assist           General transfer comment: min A from lower recliner surface      Balance Overall balance assessment: History of Falls, Needs assistance Sitting-balance support: No upper extremity supported, Feet supported Sitting balance-Leahy Scale: Fair     Standing balance support: Bilateral upper extremity supported, During functional activity, Reliant on assistive device for balance Standing balance-Leahy Scale: Poor                             ADL either performed or assessed with clinical judgement   ADL Overall ADL's : Needs assistance/impaired Eating/Feeding: Set up;Sitting   Grooming: Set up;Sitting Grooming Details (indicate cue type and reason): walked to sink, requested to sit during task Upper Body Bathing: Minimal assistance;Sitting   Lower Body Bathing: Maximal assistance;Sit to/from stand   Upper Body Dressing : Minimal assistance;Sitting   Lower Body Dressing: Maximal assistance;Sit to/from stand   Toilet Transfer: Minimal assistance;Ambulation;Rolling walker (2 wheels);BSC/3in1   Toileting- Clothing Manipulation and Hygiene: Maximal assistance;Sit to/from stand       Functional mobility during ADLs: Minimal assistance;Rolling walker (2 wheels) General ADL Comments: poor management of RW, limited activity tolerance     Vision Baseline Vision/History: 0 No visual deficits Vision Assessment?: No apparent visual deficits     Perception Perception: Not tested       Praxis Praxis: Not tested       Pertinent Vitals/Pain Pain Assessment Pain Assessment: No/denies pain  Extremity/Trunk Assessment Upper Extremity Assessment Upper Extremity Assessment: Generalized weakness;RUE deficits/detail RUE Deficits / Details: wrapped with ace, active bleeding   Lower Extremity Assessment Lower Extremity Assessment: Defer to PT  evaluation   Cervical / Trunk Assessment Cervical / Trunk Assessment: Kyphotic   Communication Communication Communication: Hearing impairment   Cognition Arousal: Alert Behavior During Therapy: WFL for tasks assessed/performed Overall Cognitive Status: Impaired/Different from baseline Area of Impairment: Orientation, Attention, Memory, Following commands, Safety/judgement                 Orientation Level: Disoriented to, Time, Situation, Place Current Attention Level: Sustained Memory: Decreased short-term memory Following Commands: Follows one step commands consistently, Follows one step commands with increased time Safety/Judgement: Decreased awareness of deficits     General Comments: HOH, difficult to fully assess cognition. Pt notably confused to situation. Difficulty following commands     General Comments  VSS on RA            Home Living Family/patient expects to be discharged to:: Assisted living                             Home Equipment: Other (comment);Grab bars - tub/shower;Grab bars - toilet;Shower seat - built in   Additional Comments: Therapist, occupational ALF      Prior Functioning/Environment Prior Level of Function : Needs assist             Mobility Comments: walks with walker with assist per pt. 2 recent falls ADLs Comments: Per pt, staff assists with all ADLs, staff does IADLs        OT Problem List: Decreased strength;Decreased range of motion;Decreased activity tolerance;Impaired balance (sitting and/or standing);Decreased cognition;Decreased safety awareness;Decreased knowledge of use of DME or AE;Decreased knowledge of precautions      OT Treatment/Interventions: Self-care/ADL training;Energy conservation;DME and/or AE instruction;Therapeutic activities;Balance training;Patient/family education    OT Goals(Current goals can be found in the care plan section) Acute Rehab OT Goals Patient Stated Goal: to leave  hospital OT Goal Formulation: With patient Time For Goal Achievement: 09/11/23 Potential to Achieve Goals: Good ADL Goals Pt Will Perform Grooming: with supervision;standing Pt Will Perform Lower Body Dressing: with min assist;sit to/from stand Pt Will Transfer to Toilet: with supervision;ambulating Pt Will Perform Toileting - Clothing Manipulation and hygiene: with supervision;sitting/lateral leans;sit to/from stand  OT Frequency: Min 1X/week       AM-PAC OT "6 Clicks" Daily Activity     Outcome Measure Help from another person eating meals?: A Little Help from another person taking care of personal grooming?: A Little Help from another person toileting, which includes using toliet, bedpan, or urinal?: A Lot Help from another person bathing (including washing, rinsing, drying)?: A Lot Help from another person to put on and taking off regular upper body clothing?: A Little Help from another person to put on and taking off regular lower body clothing?: A Lot 6 Click Score: 15   End of Session Equipment Utilized During Treatment: Rolling walker (2 wheels) Nurse Communication: Mobility status  Activity Tolerance: Patient tolerated treatment well Patient left: in chair;with call bell/phone within reach;with chair alarm set  OT Visit Diagnosis: Unsteadiness on feet (R26.81);Other abnormalities of gait and mobility (R26.89);Muscle weakness (generalized) (M62.81)                Time: 0102-7253 OT Time Calculation (min): 24 min Charges:  OT General Charges $OT Visit: 1 Visit OT Evaluation $OT Eval Moderate  Complexity: 1 Mod OT Treatments $Self Care/Home Management : 8-22 mins  Derenda Mis, OTR/L Acute Rehabilitation Services Office [phone] Secure Chat Communication Preferred   Donia Pounds 08/28/2023, 10:00 AM

## 2023-08-28 NOTE — Progress Notes (Signed)
PROGRESS NOTE    Marcus Martin  NWG:[id number] DOB: 03-08-1933 DOA: 08/26/2023 PCP: Vivien Presto, MD    Brief Narrative:  89 year old gentleman with history of hard of hearing, SIADH and chronic hyponatremia, hypertension, hyperlipidemia and history of DVT currently on Eliquis who [place] [facility] [place] as [facility] living facility after he was found with unwitnessed fall and bleeding from his right arm. Patient was conscious but hard of hearing. Hemodynamically stable in the ER. Sodium 115. Skeletal survey indicated unhealed comminuted type II dens fracture, old radial head fracture on the right side. He was also found to have a skin tear on the right elbow dressing applied. Patient was admitted to the hospital with hyponatremia, unwitnessed fall with negative skeletal survey.    Assessment & Plan:   Acute on chronic hyponatremia, previously diagnosed SIADH. Hypochloremic hyponatremia, urine sodium 50, osmolality 280.  TSH normal.  B12 normal. Sodium 115-1 L isotonic fluid-1 g sodium tablets 3 times daily and regular diet, gradually improving.  121 today.  Will avoid sudden correction.  Since he is not having any neurological symptoms, will allow gradual improvement. On discharge, sodium chloride tablets 1 g 3 times daily, regular diet, improve nutrition.  Free water restriction 1500 mL/day.  Fall at ALF, deconditioning, hold right radial head fracture, unhealed T2 dens fracture noted on CT scan of the neck: Patient with no new neurological findings.  He does have multiple old fractures with delayed healing.  MRI was discussed with patient and family and declined with no additional benefit from the test.  Symptomatic management.  Continue to work with PT OT.  Right elbow laceration with bleeding: Coagulopathy due to being on Eliquis. Patient had persistent bleeding from the right elbow, currently improved.  Eliquis on hold.  Local wound care.  COPD: Stable on  treatment.  Hypertension: Blood pressure stable on lisinopril.  DVT: Eliquis on hold due to active bleeding from the right elbow.  Will start once improved.   DVT prophylaxis: enoxaparin (LOVENOX) injection 40 mg Start: 08/27/23 1000   Code Status: DNR Family Communication: None today. Disposition Plan: Status is: Inpatient Remains inpatient appropriate because: Hyponatremia, bleeding from the elbow     Consultants:  None  Procedures:  None  Antimicrobials:  None   Subjective: Patient seen and examined.  No overnight events.  Right elbow has stopped oozing now.  Patient is expecting to go back to [place] today.  Is more alert awake, sitting in couch and watching TV.  Objective: Vitals:   08/27/23 2325 08/28/23 0325 08/28/23 0752 08/28/23 0803  BP: 138/70 123/71  (!) 128/58  Pulse: 70 86 77 84  Resp: 16 18 16 16   Temp: 98 F (36.7 C) 98.2 F (36.8 C)  97.7 F (36.5 C)  TempSrc: Oral Oral  Oral  SpO2: 99% 96% 92% 100%    Intake/Output Summary (Last 24 hours) at 08/28/2023 1027 Last data filed at 08/28/2023 0323 Gross per 24 hour  Intake --  Output 700 ml  Net -700 ml   There were no vitals filed for this visit.  Examination:  General exam: Appears calm and comfortable  Appropriate for age. Respiratory system: No added sounds. Cardiovascular system: S1 & S2 heard, RRR.  Gastrointestinal system: Soft.  Nontender.  Bowel sound present. Central nervous system: Alert and oriented. No focal neurological deficits.  Diffuse generalized weakness. Extremities: Symmetric 5 x 5 power.  Generalized weakness.  Right elbow with occlusive dressing.  Distal neurovascular status intact. Skin: Multiple areas  of ecchymosis in the skin.    Data Reviewed: I have personally reviewed following labs and imaging studies  CBC: Recent Labs  Lab 08/26/23 2245 08/27/23 2012  WBC 7.3  --   NEUTROABS 3.7  --   HGB 10.5* 9.4*  HCT 30.0* 26.2*  MCV 89.6  --   PLT 261   --    Basic Metabolic Panel: Recent Labs  Lab 08/27/23 0456 08/27/23 0743 08/27/23 1419 08/27/23 2012 08/28/23 0153  NA 119* 120* 121* 121* 121*  K 4.7 4.1 4.4 4.4 4.2  CL 91* 91* 95* 94* 96*  CO2 17* 18* 20* 19* 19*  GLUCOSE 94 109* 118* 107* 96  BUN 16 14 15 19 17   CREATININE 0.84 0.85 0.84 0.98 0.92  CALCIUM 8.3* 8.6* 8.0* 8.0* 8.0*  MG  --  1.9  --   --   --   PHOS  --  3.1  --   --   --    GFR: CrCl cannot be calculated (Unknown ideal weight.). Liver Function Tests: No results for input(s): "AST", "ALT", "ALKPHOS", "BILITOT", "PROT", "ALBUMIN" in the last 168 hours. No results for input(s): "LIPASE", "AMYLASE" in the last 168 hours. No results for input(s): "AMMONIA" in the last 168 hours. Coagulation Profile: Recent Labs  Lab 08/26/23 2245  INR 1.4*   Cardiac Enzymes: No results for input(s): "CKTOTAL", "CKMB", "CKMBINDEX", "TROPONINI" in the last 168 hours. BNP (last 3 results) No results for input(s): "PROBNP" in the last 8760 hours. HbA1C: No results for input(s): "HGBA1C" in the last 72 hours. CBG: No results for input(s): "GLUCAP" in the last 168 hours. Lipid Profile: No results for input(s): "CHOL", "HDL", "LDLCALC", "TRIG", "CHOLHDL", "LDLDIRECT" in the last 72 hours. Thyroid Function Tests: Recent Labs    08/26/23 2245  TSH 1.263   Anemia Panel: No results for input(s): "VITAMINB12", "FOLATE", "FERRITIN", "TIBC", "IRON", "RETICCTPCT" in the last 72 hours. Sepsis Labs: No results for input(s): "PROCALCITON", "LATICACIDVEN" in the last 168 hours.  No results found for this or any previous visit (from the past 240 hour(s)).       Radiology Studies: CT Head Wo Contrast  Result Date: 08/27/2023 CLINICAL DATA:  Head trauma, minor (Age >= 65y); Neck trauma (Age >= 65y) Unwitnessed fall EXAM: CT HEAD WITHOUT CONTRAST CT CERVICAL SPINE WITHOUT CONTRAST TECHNIQUE: Multidetector CT imaging of the head and cervical spine was performed following the  standard protocol without intravenous contrast. Multiplanar CT image reconstructions of the cervical spine were also generated. RADIATION DOSE REDUCTION: This exam was performed according to the departmental dose-optimization program which includes automated exposure control, adjustment of the mA and/or kV according to patient size and/or use of iterative reconstruction technique. COMPARISON:  CT head and C-spine 05/29/2023 FINDINGS: CT HEAD FINDINGS Brain: Cerebral ventricle sizes are concordant with the degree of cerebral volume loss. Patchy and confluent areas of decreased attenuation are noted throughout the deep and periventricular white matter of the cerebral hemispheres bilaterally, compatible with chronic microvascular ischemic disease. No evidence of large-territorial acute infarction. No parenchymal hemorrhage. No mass lesion. No extra-axial collection. No mass effect or midline shift. No hydrocephalus. Basilar cisterns are patent. Vascular: No hyperdense vessel. Atherosclerotic calcifications are present within the cavernous internal carotid and vertebral arteries. Skull: No acute fracture or focal lesion. Sinuses/Orbits: Right ethmoid sinus mucosal thickening. Paranasal sinuses and mastoid air cells are clear. The orbits are unremarkable. Other: None. CT CERVICAL SPINE FINDINGS Alignment: Mildly worsened anterolisthesis of C1 and C2 in relation to  the basion. Stable grade 1 anterolisthesis of C3 on C4. 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 Interval worsened distraction of a known unhealed comminuted type 2 dens fracture. Anterior cervical discectomy and fusion of the C5-C6 level. At least moderate osseous neural foraminal stenosis at the C3-C4 level. No severe osseous central canal stenosis. No aggressive appearing focal osseous lesion or focal pathologic process. Soft tissues and spinal canal: No prevertebral fluid or swelling. No visible canal  hematoma. Upper chest: Partially visualized right apical bullous changes. Other: None. IMPRESSION: 1. No acute intracranial abnormality. 2. Mildly worsened anterolisthesis of C1 and C2 in relation to the basion. Interval worsened distraction of a known unhealed comminuted type 2 dens fracture. Recommend MRI cervical spine to further evaluate acuity and associated ligaments. 3. 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 Electronically Signed   By: Tish Frederickson M.D.   On: 08/27/2023 01:30   CT Cervical Spine Wo Contrast  Result Date: 08/27/2023 CLINICAL DATA:  Head trauma, minor (Age >= 65y); Neck trauma (Age >= 65y) Unwitnessed fall EXAM: CT HEAD WITHOUT CONTRAST CT CERVICAL SPINE WITHOUT CONTRAST TECHNIQUE: Multidetector CT imaging of the head and cervical spine was performed following the standard protocol without intravenous contrast. Multiplanar CT image reconstructions of the cervical spine were also generated. RADIATION DOSE REDUCTION: This exam was performed according to the departmental dose-optimization program which includes automated exposure control, adjustment of the mA and/or kV according to patient size and/or use of iterative reconstruction technique. COMPARISON:  CT head and C-spine 05/29/2023 FINDINGS: CT HEAD FINDINGS Brain: Cerebral ventricle sizes are concordant with the degree of cerebral volume loss. Patchy and confluent areas of decreased attenuation are noted throughout the deep and periventricular white matter of the cerebral hemispheres bilaterally, compatible with chronic microvascular ischemic disease. No evidence of large-territorial acute infarction. No parenchymal hemorrhage. No mass lesion. No extra-axial collection. No mass effect or midline shift. No hydrocephalus. Basilar cisterns are patent. Vascular: No hyperdense vessel. Atherosclerotic calcifications are present within the cavernous internal carotid and vertebral arteries. Skull: No acute fracture or  focal lesion. Sinuses/Orbits: Right ethmoid sinus mucosal thickening. Paranasal sinuses and mastoid air cells are clear. The orbits are unremarkable. Other: None. CT CERVICAL SPINE FINDINGS Alignment: Mildly worsened anterolisthesis of C1 and C2 in relation to the basion. Stable grade 1 anterolisthesis of C3 on C4. 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 Interval worsened distraction of a known unhealed comminuted type 2 dens fracture. Anterior cervical discectomy and fusion of the C5-C6 level. At least moderate osseous neural foraminal stenosis at the C3-C4 level. No severe osseous central canal stenosis. No aggressive appearing focal osseous lesion or focal pathologic process. Soft tissues and spinal canal: No prevertebral fluid or swelling. No visible canal hematoma. Upper chest: Partially visualized right apical bullous changes. Other: None. IMPRESSION: 1. No acute intracranial abnormality. 2. Mildly worsened anterolisthesis of C1 and C2 in relation to the basion. Interval worsened distraction of a known unhealed comminuted type 2 dens fracture. Recommend MRI cervical spine to further evaluate acuity and associated ligaments. 3. 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 Electronically Signed   By: Tish Frederickson M.D.   On: 08/27/2023 01:30   DG Elbow Complete Right  Result Date: 08/26/2023 CLINICAL DATA:  Unwitnessed fall. EXAM: RIGHT ELBOW - COMPLETE 3+ VIEW COMPARISON:  None Available. FINDINGS: A small area of cortical irregularity of indeterminate age is seen involving the right radial head.  There is no evidence of dislocation. A radiopaque fixation plate and screws are seen overlying the mid right ulnar shaft. There is mild posterior soft tissue swelling. IMPRESSION: Small radial head fracture of indeterminate age. Correlation with physical examination is recommended to determine the presence of point tenderness. Electronically  Signed   By: Aram Candela M.D.   On: 08/26/2023 23:45   DG Pelvis Portable  Result Date: 08/26/2023 CLINICAL DATA:  Unwitnessed fall. EXAM: PORTABLE PELVIS 1-2 VIEWS COMPARISON:  May 16, 2023 FINDINGS: There is no evidence of an acute pelvic fracture or diastasis. Degenerative changes seen involving both hips, in the form of joint space narrowing, acetabular sclerosis and lateral acetabular bony spurring. No pelvic bone lesions are seen. IMPRESSION: Degenerative changes without evidence of an acute osseous abnormality. Electronically Signed   By: Aram Candela M.D.   On: 08/26/2023 23:42   DG Chest Portable 1 View  Result Date: 08/26/2023 CLINICAL DATA:  Unwitnessed fall. EXAM: PORTABLE CHEST 1 VIEW COMPARISON:  May 15, 2023 FINDINGS: The heart size and mediastinal contours are within normal limits. There is marked severity calcification of the thoracic aorta. Mild, chronic appearing increased lung markings are seen with a stable cavitary lesion seen within the medial aspect of the right upper lobe. Mild atelectasis is seen within the bilateral lung bases, left greater than right. No pleural effusion or pneumothorax is identified. Multilevel degenerative changes are seen throughout the thoracic spine. IMPRESSION: Stable right upper lobe cavitary lesion with mild bibasilar atelectasis, left greater than right. Electronically Signed   By: Aram Candela M.D.   On: 08/26/2023 23:40        Scheduled Meds:  ascorbic acid  500 mg Oral Daily   atorvastatin  20 mg Oral Daily   calcium-vitamin D  1 tablet Oral Q breakfast   cholecalciferol  1,000 Units Oral Daily   docusate sodium  100 mg Oral BID   enoxaparin (LOVENOX) injection  40 mg Subcutaneous Daily   [START ON 08/29/2023] ferrous sulfate  325 mg Oral Q M,W,F   gabapentin  300 mg Oral BID   latanoprost  1 drop Both Eyes QHS   mometasone-formoterol  2 puff Inhalation BID   multivitamin with minerals  1 tablet Oral Daily    pantoprazole  40 mg Oral BID   polyethylene glycol  17 g Oral Daily   polyvinyl alcohol  1 drop Both Eyes TID AC & HS   sodium chloride  1 g Oral TID WC   tamsulosin  0.8 mg Oral QHS   traZODone  25 mg Oral QHS   Continuous Infusions:   LOS: 1 day    Time spent: 40 minutes    Dorcas Carrow, MD [facility]

## 2023-08-28 NOTE — Progress Notes (Signed)
Updated son, Ebony Hail, via phone call. All questions answered.

## 2023-08-28 NOTE — Evaluation (Signed)
Physical Therapy Evaluation Patient Details Name: Marcus Martin MRN: [id number] DOB: 1933/08/31 Today's Date: 08/28/2023  History of Present Illness  89 yo male admitted 9/22 from [facility]F after fall. PMhx:HOH, SIADH and chronic hyponatremia, HTN, HLD, DVT, COPD, CAD, OSA, C2 fx  Clinical Impression  Pt pleasantly confused pulling on gown and telebox. Pt oriented to self only but able to follow simple commands. Mobility limited by fatigue. Pt with decreased strength, balance, cognition and falls who will benefit from acute therapy to maximize mobility, safety and function. Recommend OOB daily with staff.         If plan is discharge home, recommend the following: A little help with walking and/or transfers;Assistance with cooking/housework;A lot of help with bathing/dressing/bathroom;Assist for transportation;Supervision due to cognitive status   Can travel by private vehicle        Equipment Recommendations None recommended by PT  Recommendations for Other Services       Functional Status Assessment Patient has had a recent decline in their functional status and/or demonstrates limited ability to make significant improvements in function in a reasonable and predictable amount of time     Precautions / Restrictions Precautions Precautions: Fall      Mobility  Bed Mobility Overal bed mobility: Needs Assistance Bed Mobility: Supine to Sit     Supine to sit: Min assist     General bed mobility comments: HOB flat with gestures to initiate, min assist to fully elevate trunk from surface    Transfers Overall transfer level: Needs assistance   Transfers: Sit to/from Stand Sit to Stand: Contact guard assist           General transfer comment: CGA for safety with cues for hand placement    Ambulation/Gait Ambulation/Gait assistance: Contact guard assist Gait Distance (Feet): 30 Feet Assistive device: Rolling walker (2 wheels) Gait Pattern/deviations:  Step-through pattern, Decreased stride length   Gait velocity interpretation: <1.8 ft/sec, indicate of risk for recurrent falls   General Gait Details: pt denied further ambulation due to fatigue, cues for direction with reliance on RW  Stairs            Wheelchair Mobility     Tilt Bed    Modified Rankin (Stroke Patients Only)       Balance Overall balance assessment: History of Falls, Needs assistance Sitting-balance support: No upper extremity supported, Feet supported Sitting balance-Leahy Scale: Fair Sitting balance - Comments: EOb without support   Standing balance support: Bilateral upper extremity supported, During functional activity, Reliant on assistive device for balance Standing balance-Leahy Scale: Poor Standing balance comment: Rw in standing                             Pertinent Vitals/Pain Pain Assessment Pain Assessment: No/denies pain    Home Living Family/patient expects to be discharged to:: Assisted living                 Home Equipment: Other (comment);Grab bars - tub/shower;Grab bars - toilet;Shower seat - built in Additional Comments: Therapist, occupational ALF    Prior Function Prior Level of Function : Needs assist             Mobility Comments: walks with walker with assist per pt ADLs Comments: min assist for ADLs, staff does IADLs     Extremity/Trunk Assessment   Upper Extremity Assessment Upper Extremity Assessment: Generalized weakness    Lower Extremity Assessment Lower Extremity Assessment: Generalized  weakness    Cervical / Trunk Assessment Cervical / Trunk Assessment: Normal  Communication   Communication Communication: Hearing impairment  Cognition Arousal: Alert Behavior During Therapy: WFL for tasks assessed/performed Overall Cognitive Status: Impaired/Different from baseline Area of Impairment: Orientation, Attention, Memory, Following commands, Safety/judgement                  Orientation Level: Disoriented to, Time, Situation, Place Current Attention Level: Sustained Memory: Decreased short-term memory Following Commands: Follows one step commands consistently, Follows one step commands with increased time Safety/Judgement: Decreased awareness of deficits              General Comments      Exercises     Assessment/Plan    PT Assessment Patient needs continued PT services  PT Problem List Decreased strength;Decreased mobility;Decreased activity tolerance;Decreased balance;Decreased knowledge of use of DME;Decreased cognition       PT Treatment Interventions DME instruction;Gait training;Functional mobility training;Therapeutic activities;Patient/family education;Balance training    PT Goals (Current goals can be found in the Care Plan section)  Acute Rehab PT Goals PT Goal Formulation: Patient unable to participate in goal setting Time For Goal Achievement: 09/11/23 Potential to Achieve Goals: Fair    Frequency Min 1X/week     Co-evaluation               AM-PAC PT "6 Clicks" Mobility  Outcome Measure Help needed turning from your back to your side while in a flat bed without using bedrails?: A Little Help needed moving from lying on your back to sitting on the side of a flat bed without using bedrails?: A Little Help needed moving to and from a bed to a chair (including a wheelchair)?: A Little Help needed standing up from a chair using your arms (e.g., wheelchair or bedside chair)?: A Little Help needed to walk in hospital room?: A Little Help needed climbing 3-5 steps with a railing? : A Lot 6 Click Score: 17    End of Session Equipment Utilized During Treatment: Gait belt Activity Tolerance: Patient tolerated treatment well Patient left: in chair;with call bell/phone within reach;with chair alarm set Nurse Communication: Mobility status PT Visit Diagnosis: Other abnormalities of gait and mobility (R26.89);History of falling  (Z91.81);Muscle weakness (generalized) (M62.81)    Time: 5366-4403 PT Time Calculation (min) (ACUTE ONLY): 16 min   Charges:   PT Evaluation $PT Eval Moderate Complexity: 1 Mod   PT General Charges $$ ACUTE PT VISIT: 1 Visit         Merryl Hacker, PT Acute Rehabilitation Services Office: [phone]   Cristine Polio 08/28/2023, 8:48 AM

## 2023-08-29 DIAGNOSIS — E871 Hypo-osmolality and hyponatremia: Secondary | ICD-10-CM | POA: Diagnosis not present

## 2023-08-29 LAB — BASIC METABOLIC PANEL
Anion gap: 6 (ref 5–15)
BUN: 13 mg/dL (ref 8–23)
CO2: 23 mmol/L (ref 22–32)
Calcium: 8.2 mg/dL — ABNORMAL LOW (ref 8.9–10.3)
Chloride: 96 mmol/L — ABNORMAL LOW (ref 98–111)
Creatinine, Ser: 0.77 mg/dL (ref 0.61–1.24)
GFR, Estimated: >60 mL/min (ref >60)
Glucose, Bld: 100 mg/dL — ABNORMAL HIGH (ref 70–99)
Potassium: 4.1 mmol/L (ref 3.5–5.1)
Sodium: 125 mmol/L — ABNORMAL LOW (ref 135–145)

## 2023-08-29 MED ORDER — ASPIRIN 81 MG PO TBEC
81.0000 mg | DELAYED_RELEASE_TABLET | Freq: Every day | ORAL | Status: DC
  Administered 2023-08-29 – 2023-08-30 (×2): 81 mg via ORAL
  Filled 2023-08-29 (×2): qty 1

## 2023-08-29 NOTE — Progress Notes (Signed)
PROGRESS NOTE    Marcus Martin  IEP:[id number] DOB: 09-Feb-1933 DOA: 08/26/2023 PCP: Vivien Presto, MD    Brief Narrative:  89 year old gentleman with history of hard of hearing, SIADH and chronic hyponatremia, hypertension, hyperlipidemia and history of DVT currently on Eliquis who [place] living facility after he was found with unwitnessed fall and bleeding from his right arm. Patient was conscious but hard of hearing. Hemodynamically stable in the ER. Sodium 115. Skeletal survey indicated unhealed comminuted type II dens fracture, old radial head fracture on the right side. He was also found to have a skin tear on the right elbow dressing applied. Patient was admitted to the hospital with hyponatremia, unwitnessed fall with negative skeletal survey.    Assessment & Plan:   Acute on chronic hyponatremia, previously diagnosed SIADH. Hypochloremic hyponatremia, urine sodium 50, osmolality 280.  TSH normal.  B12 normal. Sodium 115-1 L isotonic fluid-1 g sodium tablets 3 times daily and regular diet, gradually improving.  125 today.  On discharge, sodium chloride tablets 1 g 3 times daily, regular diet, improve nutrition.  Free water restriction 1500 mL/day.  Fall at ALF, deconditioning, old right radial head fracture, unhealed T2 dens fracture noted on CT scan of the neck: Patient with no new neurological findings.  He does have multiple old fractures with delayed healing.  MRI was discussed with patient and family and declined with no additional benefit from the test.  Symptomatic management. Continue to work with PT OT.  Right elbow laceration with bleeding: Coagulopathy due to being on Eliquis. Patient had persistent bleeding from the right elbow, currently improved.  Eliquis on hold.  Local wound care.  Monitor today.  COPD: Stable on treatment.  Hypertension: Blood pressure stable on lisinopril.  DVT: Eliquis on hold due to active bleeding from the  right elbow.  Will start once improved.  Resume aspirin.   DVT prophylaxis: enoxaparin (LOVENOX) injection 40 mg Start: 08/27/23 1000   Code Status: DNR Family Communication: None today. Disposition Plan: Status is: Inpatient Remains inpatient appropriate because: Hyponatremia, bleeding from the elbow     Consultants:  None  Procedures:  None  Antimicrobials:  None   Subjective: Patient seen and examined.  Denies any complaints.  Eager to go back to [place]. Sodium has consistently been 125-125.  Appetite is fair. Right elbow dressing was soaked with blood, carefully removed.  Surgicel intact, reinforced.   Objective: Vitals:   08/28/23 1939 08/29/23 0436 08/29/23 0717 08/29/23 0856  BP: (!) 134/40 (!) 128/48  (!) 115/41  Pulse: 78 77  85  Resp: 17 18  16   Temp: 97.8 F (36.6 C) 98 F (36.7 C)  97.8 F (36.6 C)  TempSrc: Oral     SpO2: 97% 100% 96% 97%  Weight:  62.6 kg      Intake/Output Summary (Last 24 hours) at 08/29/2023 1128 Last data filed at 08/29/2023 1000 Gross per 24 hour  Intake 640 ml  Output 1200 ml  Net -560 ml   Filed Weights   08/29/23 0436  Weight: 62.6 kg    Examination:  General exam: Appears calm and comfortable  Appropriate for age.  Pleasant to conversation. Respiratory system: No added sounds. Cardiovascular system: S1 & S2 heard, RRR.  Gastrointestinal system: Soft.  Nontender.  Bowel sound present. Central nervous system: Alert and oriented. No focal neurological deficits.  Diffuse generalized weakness. Extremities: Symmetric 5 x 5 power.  Generalized weakness.  Right elbow with occlusive dressing.  Distal neurovascular status intact. Skin: Multiple areas of ecchymosis in the skin. Right elbow laceration still with intermittent bleeding, however much improved than before.    Data Reviewed: I have personally reviewed following labs and imaging studies  CBC: Recent Labs  Lab 08/26/23 2245 08/27/23 2012  WBC  7.3  --   NEUTROABS 3.7  --   HGB 10.5* 9.4*  HCT 30.0* 26.2*  MCV 89.6  --   PLT 261  --    Basic Metabolic Panel: Recent Labs  Lab 08/27/23 0743 08/27/23 1419 08/27/23 2012 08/28/23 0153 08/28/23 1747 08/29/23 0813  NA 120* 121* 121* 121* 125* 125*  K 4.1 4.4 4.4 4.2 4.3 4.1  CL 91* 95* 94* 96* 91* 96*  CO2 18* 20* 19* 19* 21* 23  GLUCOSE 109* 118* 107* 96 105* 100*  BUN 14 15 19 17 17 13   CREATININE 0.85 0.84 0.98 0.92 0.87 0.77  CALCIUM 8.6* 8.0* 8.0* 8.0* 8.6* 8.2*  MG 1.9  --   --   --   --   --   PHOS 3.1  --   --   --   --   --    GFR: Estimated Creatinine Clearance: 56.5 mL/min (by C-G formula based on SCr of 0.77 mg/dL). Liver Function Tests: No results for input(s): "AST", "ALT", "ALKPHOS", "BILITOT", "PROT", "ALBUMIN" in the last 168 hours. No results for input(s): "LIPASE", "AMYLASE" in the last 168 hours. No results for input(s): "AMMONIA" in the last 168 hours. Coagulation Profile: Recent Labs  Lab 08/26/23 2245  INR 1.4*   Cardiac Enzymes: No results for input(s): "CKTOTAL", "CKMB", "CKMBINDEX", "TROPONINI" in the last 168 hours. BNP (last 3 results) No results for input(s): "PROBNP" in the last 8760 hours. HbA1C: No results for input(s): "HGBA1C" in the last 72 hours. CBG: No results for input(s): "GLUCAP" in the last 168 hours. Lipid Profile: No results for input(s): "CHOL", "HDL", "LDLCALC", "TRIG", "CHOLHDL", "LDLDIRECT" in the last 72 hours. Thyroid Function Tests: Recent Labs    08/26/23 2245  TSH 1.263   Anemia Panel: No results for input(s): "VITAMINB12", "FOLATE", "FERRITIN", "TIBC", "IRON", "RETICCTPCT" in the last 72 hours. Sepsis Labs: No results for input(s): "PROCALCITON", "LATICACIDVEN" in the last 168 hours.  No results found for this or any previous visit (from the past 240 hour(s)).       Radiology Studies: No results found.      Scheduled Meds:  ascorbic acid  500 mg Oral Daily   aspirin EC  81 mg Oral Daily    atorvastatin  20 mg Oral Daily   calcium-vitamin D  1 tablet Oral Q breakfast   cholecalciferol  1,000 Units Oral Daily   docusate sodium  100 mg Oral BID   enoxaparin (LOVENOX) injection  40 mg Subcutaneous Daily   ferrous sulfate  325 mg Oral Q M,W,F   gabapentin  300 mg Oral BID   latanoprost  1 drop Both Eyes QHS   mometasone-formoterol  2 puff Inhalation BID   multivitamin with minerals  1 tablet Oral Daily   pantoprazole  40 mg Oral BID   polyethylene glycol  17 g Oral Daily   polyvinyl alcohol  1 drop Both Eyes TID AC & HS   sodium chloride  1 g Oral TID WC   tamsulosin  0.8 mg Oral QHS   traZODone  25 mg Oral QHS   Continuous Infusions:   LOS: 2 days    Time spent: 35 minutes  Dorcas Carrow, MD [facility]

## 2023-08-29 NOTE — Consult Note (Addendum)
WOC Nurse Consult Note: Reason for Consult: Consult requested for bilat arms. Pt has full thickness abrasions to left arm and right elbow. Left arm red and moist, small amt bloody drainage, 6X6X.2cm Right elbow with skin tear; 3X4X.2cm 80% skin approximated , 20% red and moist full thickness wound, small amt bloody drainage. Both sites with large amt old blood but no further bleeding. Steristrips are in place.  Dressing procedure/placement/frequency: Topical treatment orders provided for bedside nurses to perform as follows to promote healing: Leave steristrips in place to left arm and right elbow until they dry up and fall off.   Apply Xerform gauze over steristrips and fcover with foam dressings.  Change dressings Q M/W/F. Please re-consult if further assistance is needed.  Thank-you,  Cammie Mcgee MSN, RN, CWOCN, [place], CNS [phone]

## 2023-08-29 NOTE — Progress Notes (Signed)
Transition of Care [facility]) - Inpatient Brief Assessment   Patient Details  Name: Marcus Martin MRN: [id number] Date of Birth: 15-Feb-1933  Transition of Care [facility]) CM/SW Contact:    Janae Bridgeman, RN Phone Number: 08/29/2023, 4:36 PM   Clinical Narrative: Patient was admitted to the hospital for Hyponatremia from [facility].  TOC Team will continue to follow the patient for TOC needs to return to facility when medically stable for discharge.   Transition of Care Asessment: Insurance and Status: (P) Insurance coverage has been reviewed Patient has primary care physician: (P) Yes Home environment has been reviewed: (P) Patient is from [facility] Prior level of function:: (P) Transport planner Home Services: (P) Current home services Social Determinants of Health Reivew: (P) SDOH reviewed needs interventions Readmission risk has been reviewed: (P) Yes Transition of care needs: (P) transition of care needs identified, TOC will continue to follow

## 2023-08-29 NOTE — Consult Note (Signed)
Value-Based Care [facility] Inpatient Consult   08/29/2023  Marcus Martin May 20, 1933 [id number]  [facility] [[facility]]  Accountable Care Organization [ACO] Patient: Marcus Martin  Primary Care Provider: Vivien Presto, MD is a provider with [facility], not a [facility] provider.  Patient is from [facility] noted as well.   RN Hospital Liaison rounded and patient was being seen by the [facility] nurse.    The patient was screened for noted extreme high risk score for unplanned readmission risk.   The patient was assessed for potential [facility]) Care Management service needs for post hospital transition for care coordination. Review of patient's electronic medical record reveals patient is also active with the [facility] for follow up.   Plan: No follow up planned at this time, followed by [facility] Will sign off.   For questions contact:   Charlesetta Shanks, RN, BSN, CCM [place]  [facility] Rehabilitation Liaison Direct Dial: [phone] or secure chat Website: [email or web address]

## 2023-08-29 NOTE — Plan of Care (Signed)

## 2023-08-30 DIAGNOSIS — E871 Hypo-osmolality and hyponatremia: Secondary | ICD-10-CM | POA: Diagnosis not present

## 2023-08-30 NOTE — Progress Notes (Signed)
RN called and gave report to [place] at [facility].

## 2023-08-30 NOTE — Discharge Summary (Signed)
Physician Discharge Summary  Chivas Watring ZOX:[id number] DOB: 08/04/1933 DOA: 08/26/2023  PCP: Vivien Presto, MD  Admit date: 08/26/2023 Discharge date: 08/30/2023  Admitted From: Assisted living facility Disposition: Assisted living facility  Recommendations for Outpatient Follow-up:  Follow up with PCP in 1-2 weeks on discharge Please obtain BMP/CBC in one week Wound care instructions attached Resume Eliquis on 9/30  Home Health: PT/OT Equipment/Devices: Already available at home  Discharge Condition: Stable CODE STATUS: DNR Diet recommendation: Regular diet, nutritional supplements, sodium chloride tablet supplementation.  Discharge summary: 89 year old gentleman with history of SIADH and chronic hyponatremia, hypertension, hyperlipidemia and history of DVT currently on Eliquis who lives in [facility] at assisted living facility sent to ER after he was found with unwitnessed fall and bleeding from his right arm. Patient was conscious but hard of hearing. Hemodynamically stable in the ER. Sodium 115. Skeletal survey indicated unhealed comminuted type II dens fracture, old radial head fracture on the right side. He was also found to have a skin tear on the right elbow dressing applied. Patient was admitted to the hospital with significant hyponatremia, unwitnessed fall.  Remained in the hospital due to ongoing bleeding from the elbow laceration that has improved now.     Assessment & plan of care:   Acute on chronic hyponatremia, previously diagnosed SIADH. Hypochloremic hyponatremia, urine sodium 50, osmolality 280.  TSH normal.  B12 normal.  Sodium 115-1 L isotonic fluid-1 g sodium tablets 3 times daily and regular diet, gradually improving.  Remains consistently 125. On discharge, sodium chloride tablets 1 g 3 times daily, regular diet, improve nutrition.  Free water restriction 1500 mL/day.   Fall at ALF, deconditioning, old right radial head fracture, unhealed T2 dens  fracture noted on CT scan of the neck: Patient with no new neurological findings.  He does have multiple old fractures with delayed healing.  MRI was discussed with patient and family and declined with no additional benefit from the test.  Symptomatic management. Continue to work with PT OT.   Right elbow laceration with bleeding: Coagulopathy due to being on Eliquis. Patient had persistent bleeding from the right elbow, currently improved.  Eliquis on hold.  Local wound care instructions attached. Aspirin resumed. Due to significant bleeding, will hold Eliquis for total 1 week. If patient continues to have fall, will need to reevaluate for continuing Eliquis.   COPD: Stable on treatment.   Hypertension: Blood pressure stable on lisinopril.  Continue.   DVT: Eliquis on hold due to active bleeding from the right elbow.  Due to significant bleeding and was difficult to control, hold Eliquis until 9/30.  Resume aspirin.  Stable for discharge back to ALF with PT OT.  Discharge Diagnoses:  Principal Problem:   Hyponatremia Active Problems:   SIADH (syndrome of inappropriate ADH production) (HCC)   Fall   COPD (chronic obstructive pulmonary disease) (HCC)   DVT (deep venous thrombosis) (HCC)   HLD (hyperlipidemia)   HTN (hypertension)   CAD (coronary artery disease)   OSA (obstructive sleep apnea)   Peripheral neuropathy    Discharge Instructions  Discharge Instructions     Diet general   Complete by: As directed    Discharge instructions   Complete by: As directed    Start back on Eliquis on 9/30. Recheck hemoglobin and BMP on 9/30   Discharge wound care:   Complete by: As directed    Daily      Comments: Leave steristrips in place to left arm and right  elbow until they dry up and fall off.   Apply Xerform gauze over steristrips and cover with foam dressings.  Change dressings Q M/W/F   Increase activity slowly   Complete by: As directed       Allergies as of 08/30/2023        Reactions   Ticagrelor Other (See Comments)   GI Hemorrhage   Coffea Arabica Other (See Comments)   DECAF coffee causes vision changes DECAF coffee causes vision changes per pt.   Simvastatin Other (See Comments)   Myalgias   Tetanus Toxoids Other (See Comments)   "Horse serum only" Unknown reaction Documented on MAR   Spiriva Respimat [tiotropium Bromide Monohydrate] Other (See Comments)   Urinary retention    Niacin Other (See Comments)   Flushing [facility] but patient can not remember exact reaction        Medication List     TAKE these medications    acetaminophen 500 MG tablet Commonly known as: TYLENOL Take 1,000 mg by mouth every 8 (eight) hours as needed for fever.   albuterol 108 (90 Base) MCG/ACT inhaler Commonly known as: VENTOLIN HFA Inhale 2 puffs into the lungs every 6 (six) hours as needed for wheezing or shortness of breath.   aluminum-magnesium hydroxide 200-200 MG/5ML suspension Take 20 mLs by mouth every 6 (six) hours as needed for indigestion.   apixaban 5 MG Tabs tablet Commonly known as: ELIQUIS Take 1 tablet (5 mg total) by mouth 2 (two) times daily.   ascorbic acid 500 MG tablet Commonly known as: VITAMIN C Take 500 mg by mouth daily.   aspirin EC 81 MG tablet Take 81 mg by mouth daily.   atorvastatin 40 MG tablet Commonly known as: LIPITOR Take 0.5 tablets (20 mg total) by mouth daily.   Baclofen 5 MG Tabs Take 5 mg by mouth every 8 (eight) hours as needed (for muscle spasms/pain).   Calcium 500 +D 500-10 MG-MCG Tabs Generic drug: Calcium Carb-Cholecalciferol Take 1 tablet by mouth daily.   cholecalciferol 25 MCG (1000 UNIT) tablet Commonly known as: VITAMIN D3 Take 1,000 Units by mouth daily.   docusate sodium 100 MG capsule Commonly known as: COLACE Take 100 mg by mouth 2 (two) times daily.   ferrous sulfate 325 (65 FE) MG EC tablet Take 325 mg by mouth every Monday, Wednesday, and Friday.   furosemide 20 MG  tablet Commonly known as: LASIX Take 20 mg by mouth daily as needed (Overload: Weight gain of 3 pounds, 5 pounds, weight gain over a week, shortness of breath and/or swelling).   gabapentin 300 MG capsule Commonly known as: NEURONTIN Take 1 capsule (300 mg total) by mouth 2 (two) times daily.   guaiFENesin 600 MG 12 hr tablet Commonly known as: MUCINEX Take 600 mg by mouth 2 (two) times daily as needed for cough or to loosen phlegm.   ipratropium-albuterol 0.5-2.5 (3) MG/3ML Soln Commonly known as: DUONEB Take 3 mLs by nebulization every 6 (six) hours as needed (for shortness of breath and wheezing).   latanoprost 0.005 % ophthalmic solution Commonly known as: XALATAN Place 1 drop into both eyes at bedtime.   lisinopril 10 MG tablet Commonly known as: ZESTRIL Take 5 mg by mouth daily. Hold for SBP less than 100.   Lutein 10 MG Tabs Take 10 mg by mouth in the morning.   multivitamin tablet Take 1 tablet by mouth daily. Multivitamin + Folic acid 400 mcg.   nystatin powder Apply 1 Application  topically every 12 (twelve) hours as needed (irritation).   OVER THE COUNTER MEDICATION Take 1 capsule by mouth at bedtime. Relaxium Sleep   pantoprazole 40 MG tablet Commonly known as: PROTONIX Take 1 tablet (40 mg total) by mouth 2 (two) times daily before a meal. What changed: when to take this   polyethylene glycol 17 g packet Commonly known as: MIRALAX / GLYCOLAX Take 17 g by mouth daily.   Refresh Tears 0.5 % Soln Generic drug: carboxymethylcellulose Place 1 drop into both eyes 4 (four) times daily.   Risa-Bid Probiotic Tabs Take 1 tablet by mouth in the morning.   sennosides-docusate sodium 8.6-50 MG tablet Commonly known as: SENOKOT-S Take 1 tablet by mouth daily as needed for constipation.   Simbrinza 1-0.2 % Susp Generic drug: Brinzolamide-Brimonidine Place 1 drop into both eyes 2 (two) times daily.   sodium chloride 1 g tablet Take 1 tablet (1 g total) by  mouth 3 (three) times daily with meals.   tamsulosin 0.4 MG Caps capsule Commonly known as: FLOMAX Take 2 capsules (0.8 mg total) by mouth at bedtime.   traZODone 50 MG tablet Commonly known as: DESYREL Take 0.5 tablets (25 mg total) by mouth at bedtime.   Tudorza Pressair 400 MCG/ACT Aepb Generic drug: Aclidinium Bromide Inhale 1 puff into the lungs in the morning and at bedtime.   Wixela Inhub 250-50 MCG/ACT Aepb Generic drug: fluticasone-salmeterol Inhale 1 puff into the lungs in the morning and at bedtime.               Discharge Care Instructions  (From admission, onward)           Start     Ordered   08/30/23 0000  Discharge wound care:       Comments: Daily      Comments: Leave steristrips in place to left arm and right elbow until they dry up and fall off.   Apply Xerform gauze over steristrips and cover with foam dressings.  Change dressings Q M/W/F   08/30/23 1119            Allergies  Allergen Reactions   Ticagrelor Other (See Comments)    GI Hemorrhage   Coffea Arabica Other (See Comments)    DECAF coffee causes vision changes  DECAF coffee causes vision changes per pt.   Simvastatin Other (See Comments)    Myalgias   Tetanus Toxoids Other (See Comments)    "Horse serum only" Unknown reaction Documented on MAR    Spiriva Respimat [Tiotropium Bromide Monohydrate] Other (See Comments)    Urinary retention    Niacin Other (See Comments)    Flushing  [facility] but patient can not remember exact reaction    Consultations: None   Procedures/Studies: CT Head Wo Contrast  Result Date: 08/27/2023 CLINICAL DATA:  Head trauma, minor (Age >= 65y); Neck trauma (Age >= 65y) Unwitnessed fall EXAM: CT HEAD WITHOUT CONTRAST CT CERVICAL SPINE WITHOUT CONTRAST TECHNIQUE: Multidetector CT imaging of the head and cervical spine was performed following the standard protocol without intravenous contrast. Multiplanar CT image reconstructions of the  cervical spine were also generated. RADIATION DOSE REDUCTION: This exam was performed according to the departmental dose-optimization program which includes automated exposure control, adjustment of the mA and/or kV according to patient size and/or use of iterative reconstruction technique. COMPARISON:  CT head and C-spine 05/29/2023 FINDINGS: CT HEAD FINDINGS Brain: Cerebral ventricle sizes are concordant with the degree of cerebral volume loss. Patchy and confluent  areas of decreased attenuation are noted throughout the deep and periventricular white matter of the cerebral hemispheres bilaterally, compatible with chronic microvascular ischemic disease. No evidence of large-territorial acute infarction. No parenchymal hemorrhage. No mass lesion. No extra-axial collection. No mass effect or midline shift. No hydrocephalus. Basilar cisterns are patent. Vascular: No hyperdense vessel. Atherosclerotic calcifications are present within the cavernous internal carotid and vertebral arteries. Skull: No acute fracture or focal lesion. Sinuses/Orbits: Right ethmoid sinus mucosal thickening. Paranasal sinuses and mastoid air cells are clear. The orbits are unremarkable. Other: None. CT CERVICAL SPINE FINDINGS Alignment: Mildly worsened anterolisthesis of C1 and C2 in relation to the basion. Stable grade 1 anterolisthesis of C3 on C4. 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 Interval worsened distraction of a known unhealed comminuted type 2 dens fracture. Anterior cervical discectomy and fusion of the C5-C6 level. At least moderate osseous neural foraminal stenosis at the C3-C4 level. No severe osseous central canal stenosis. No aggressive appearing focal osseous lesion or focal pathologic process. Soft tissues and spinal canal: No prevertebral fluid or swelling. No visible canal hematoma. Upper chest: Partially visualized right apical bullous changes. Other: None. IMPRESSION:  1. No acute intracranial abnormality. 2. Mildly worsened anterolisthesis of C1 and C2 in relation to the basion. Interval worsened distraction of a known unhealed comminuted type 2 dens fracture. Recommend MRI cervical spine to further evaluate acuity and associated ligaments. 3. 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 Electronically Signed   By: Tish Frederickson M.D.   On: 08/27/2023 01:30   CT Cervical Spine Wo Contrast  Result Date: 08/27/2023 CLINICAL DATA:  Head trauma, minor (Age >= 65y); Neck trauma (Age >= 65y) Unwitnessed fall EXAM: CT HEAD WITHOUT CONTRAST CT CERVICAL SPINE WITHOUT CONTRAST TECHNIQUE: Multidetector CT imaging of the head and cervical spine was performed following the standard protocol without intravenous contrast. Multiplanar CT image reconstructions of the cervical spine were also generated. RADIATION DOSE REDUCTION: This exam was performed according to the departmental dose-optimization program which includes automated exposure control, adjustment of the mA and/or kV according to patient size and/or use of iterative reconstruction technique. COMPARISON:  CT head and C-spine 05/29/2023 FINDINGS: CT HEAD FINDINGS Brain: Cerebral ventricle sizes are concordant with the degree of cerebral volume loss. Patchy and confluent areas of decreased attenuation are noted throughout the deep and periventricular white matter of the cerebral hemispheres bilaterally, compatible with chronic microvascular ischemic disease. No evidence of large-territorial acute infarction. No parenchymal hemorrhage. No mass lesion. No extra-axial collection. No mass effect or midline shift. No hydrocephalus. Basilar cisterns are patent. Vascular: No hyperdense vessel. Atherosclerotic calcifications are present within the cavernous internal carotid and vertebral arteries. Skull: No acute fracture or focal lesion. Sinuses/Orbits: Right ethmoid sinus mucosal thickening. Paranasal sinuses and mastoid  air cells are clear. The orbits are unremarkable. Other: None. CT CERVICAL SPINE FINDINGS Alignment: Mildly worsened anterolisthesis of C1 and C2 in relation to the basion. Stable grade 1 anterolisthesis of C3 on C4. 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 Interval worsened distraction of a known unhealed comminuted type 2 dens fracture. Anterior cervical discectomy and fusion of the C5-C6 level. At least moderate osseous neural foraminal stenosis at the C3-C4 level. No severe osseous central canal stenosis. No aggressive appearing focal osseous lesion or focal pathologic process. Soft tissues and spinal canal: No prevertebral fluid or swelling. No visible canal hematoma. Upper chest: Partially visualized right apical bullous changes. Other:  None. IMPRESSION: 1. No acute intracranial abnormality. 2. Mildly worsened anterolisthesis of C1 and C2 in relation to the basion. Interval worsened distraction of a known unhealed comminuted type 2 dens fracture. Recommend MRI cervical spine to further evaluate acuity and associated ligaments. 3. 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 Electronically Signed   By: Tish Frederickson M.D.   On: 08/27/2023 01:30   DG Elbow Complete Right  Result Date: 08/26/2023 CLINICAL DATA:  Unwitnessed fall. EXAM: RIGHT ELBOW - COMPLETE 3+ VIEW COMPARISON:  None Available. FINDINGS: A small area of cortical irregularity of indeterminate age is seen involving the right radial head. There is no evidence of dislocation. A radiopaque fixation plate and screws are seen overlying the mid right ulnar shaft. There is mild posterior soft tissue swelling. IMPRESSION: Small radial head fracture of indeterminate age. Correlation with physical examination is recommended to determine the presence of point tenderness. Electronically Signed   By: Aram Candela M.D.   On: 08/26/2023 23:45   DG Pelvis Portable  Result Date:  08/26/2023 CLINICAL DATA:  Unwitnessed fall. EXAM: PORTABLE PELVIS 1-2 VIEWS COMPARISON:  May 16, 2023 FINDINGS: There is no evidence of an acute pelvic fracture or diastasis. Degenerative changes seen involving both hips, in the form of joint space narrowing, acetabular sclerosis and lateral acetabular bony spurring. No pelvic bone lesions are seen. IMPRESSION: Degenerative changes without evidence of an acute osseous abnormality. Electronically Signed   By: Aram Candela M.D.   On: 08/26/2023 23:42   DG Chest Portable 1 View  Result Date: 08/26/2023 CLINICAL DATA:  Unwitnessed fall. EXAM: PORTABLE CHEST 1 VIEW COMPARISON:  May 15, 2023 FINDINGS: The heart size and mediastinal contours are within normal limits. There is marked severity calcification of the thoracic aorta. Mild, chronic appearing increased lung markings are seen with a stable cavitary lesion seen within the medial aspect of the right upper lobe. Mild atelectasis is seen within the bilateral lung bases, left greater than right. No pleural effusion or pneumothorax is identified. Multilevel degenerative changes are seen throughout the thoracic spine. IMPRESSION: Stable right upper lobe cavitary lesion with mild bibasilar atelectasis, left greater than right. Electronically Signed   By: Aram Candela M.D.   On: 08/26/2023 23:40   (Echo, Carotid, EGD, Colonoscopy, ERCP)    Subjective: Patient seen and examined.  Sleepy.  Denies any complaints.  Eager to go back to [place].  Elbow bleeding has stopped and on occlusive dressing now.   Discharge Exam: Vitals:   08/30/23 0459 08/30/23 0916  BP: 104/68 105/80  Pulse: 82 83  Resp: 18 16  Temp: 98 F (36.7 C) 98.2 F (36.8 C)  SpO2: 98% 100%   Vitals:   08/29/23 1553 08/29/23 1927 08/30/23 0459 08/30/23 0916  BP: (!) 126/58 (!) 143/49 104/68 105/80  Pulse: 78 78 82 83  Resp: 17 18 18 16   Temp: 98.1 F (36.7 C) 97.8 F (36.6 C) 98 F (36.7 C) 98.2 F (36.8 C)   TempSrc: Oral Oral Oral Oral  SpO2: 100% 99% 98% 100%  Weight:        General: Pt is alert, awake, not in acute distress Hard of hearing.  Appropriate and alert to himself and family. Cardiovascular: RRR, S1/S2 +, no rubs, no gallops Respiratory: CTA bilaterally, no wheezing, no rhonchi Abdominal: Soft, NT, ND, bowel sounds + Extremities:  Multiple areas of ecchymosis in the skin. Right elbow laceration, currently not actively bleeding.  On occlusive dressing. Left forearm laceration on  occlusive dressing.    The results of significant diagnostics from this hospitalization (including imaging, microbiology, ancillary and laboratory) are listed below for reference.     Microbiology: No results found for this or any previous visit (from the past 240 hour(s)).   Labs: BNP (last 3 results) Recent Labs    01/08/23 1330  BNP 144.8*   Basic Metabolic Panel: Recent Labs  Lab 08/27/23 0743 08/27/23 1419 08/27/23 2012 08/28/23 0153 08/28/23 1747 08/29/23 0813  NA 120* 121* 121* 121* 125* 125*  K 4.1 4.4 4.4 4.2 4.3 4.1  CL 91* 95* 94* 96* 91* 96*  CO2 18* 20* 19* 19* 21* 23  GLUCOSE 109* 118* 107* 96 105* 100*  BUN 14 15 19 17 17 13   CREATININE 0.85 0.84 0.98 0.92 0.87 0.77  CALCIUM 8.6* 8.0* 8.0* 8.0* 8.6* 8.2*  MG 1.9  --   --   --   --   --   PHOS 3.1  --   --   --   --   --    Liver Function Tests: No results for input(s): "AST", "ALT", "ALKPHOS", "BILITOT", "PROT", "ALBUMIN" in the last 168 hours. No results for input(s): "LIPASE", "AMYLASE" in the last 168 hours. No results for input(s): "AMMONIA" in the last 168 hours. CBC: Recent Labs  Lab 08/26/23 2245 08/27/23 2012  WBC 7.3  --   NEUTROABS 3.7  --   HGB 10.5* 9.4*  HCT 30.0* 26.2*  MCV 89.6  --   PLT 261  --    Cardiac Enzymes: No results for input(s): "CKTOTAL", "CKMB", "CKMBINDEX", "TROPONINI" in the last 168 hours. BNP: Invalid input(s): "POCBNP" CBG: No results for input(s): "GLUCAP" in the  last 168 hours. D-Dimer No results for input(s): "DDIMER" in the last 72 hours. Hgb A1c No results for input(s): "HGBA1C" in the last 72 hours. Lipid Profile No results for input(s): "CHOL", "HDL", "LDLCALC", "TRIG", "CHOLHDL", "LDLDIRECT" in the last 72 hours. Thyroid function studies No results for input(s): "TSH", "T4TOTAL", "T3FREE", "THYROIDAB" in the last 72 hours.  Invalid input(s): "FREET3" Anemia work up No results for input(s): "VITAMINB12", "FOLATE", "FERRITIN", "TIBC", "IRON", "RETICCTPCT" in the last 72 hours. Urinalysis    Component Value Date/Time   COLORURINE YELLOW 08/27/2023 0140   APPEARANCEUR CLEAR 08/27/2023 0140   LABSPEC 1.008 08/27/2023 0140   PHURINE 7.0 08/27/2023 0140   GLUCOSEU NEGATIVE 08/27/2023 0140   HGBUR NEGATIVE 08/27/2023 0140   BILIRUBINUR NEGATIVE 08/27/2023 0140   KETONESUR NEGATIVE 08/27/2023 0140   PROTEINUR NEGATIVE 08/27/2023 0140   NITRITE NEGATIVE 08/27/2023 0140   LEUKOCYTESUR NEGATIVE 08/27/2023 0140   Sepsis Labs Recent Labs  Lab 08/26/23 2245  WBC 7.3   Microbiology No results found for this or any previous visit (from the past 240 hour(s)).   Time coordinating discharge:  32 minutes  SIGNED:   Dorcas Carrow, MD  [facility] 08/30/2023, 11:20 AM

## 2023-08-30 NOTE — Progress Notes (Signed)
RN attempted to call report at phone number provided. No answer. HIPAA compliant voicemail left with call back number.

## 2023-08-30 NOTE — [place] FL2 (Addendum)
[place] MEDICAID FL2 LEVEL OF CARE FORM     IDENTIFICATION  Patient Name: Marcus Martin Birthdate: 1933/09/30 Sex: male Admission Date (Current Location): 08/26/2023  [facility] and [place] Number:  Producer, television/film/video and Address:  [facility], [address], [place], [place] [address]      Provider Number: [id number]  Attending Physician Name and Address:  Dorcas Carrow, MD  Relative Name and Phone Number:  Marcie Bal (Son) [phone]    Current Level of Care: Hospital Recommended Level of Care: Assisted Living Facility Prior Approval Number:    Date Approved/Denied:   PASRR Number: [id number]  Discharge Plan: Other (Comment) (ALF)    Current Diagnoses: Patient Active Problem List   Diagnosis Date Noted   COPD (chronic obstructive pulmonary disease) (HCC) 08/27/2023   DVT (deep venous thrombosis) (HCC) 08/27/2023   HLD (hyperlipidemia) 08/27/2023   HTN (hypertension) 08/27/2023   CAD (coronary artery disease) 08/27/2023   OSA (obstructive sleep apnea) 08/27/2023   Peripheral neuropathy 08/27/2023   Anemia 01/20/2023   Urinary retention 01/18/2023   Lower limb ischemia 01/17/2023   COPD exacerbation (HCC) 01/09/2023   Malnutrition of moderate degree 11/19/2021   Cholelithiasis without cholecystitis 11/17/2021   Aortic atherosclerosis (HCC) 11/17/2021   Acute sore throat 11/17/2021   Altered mental status 11/16/2021   SIADH (syndrome of inappropriate ADH production) (HCC)    Gastroesophageal reflux disease with esophagitis without hemorrhage    Acute hypoxic respiratory failure (HCC)    Abdominal distention    Fall    Hyponatremia    Acute encephalopathy 10/31/2021    Orientation RESPIRATION BLADDER Height & Weight     Self, Place, Time  Normal Continent Weight: 138 lb (62.6 kg) Height:     BEHAVIORAL SYMPTOMS/MOOD NEUROLOGICAL BOWEL NUTRITION STATUS      Continent Diet: general   AMBULATORY STATUS COMMUNICATION OF NEEDS Skin    Limited Assist Verbally Other (Comment) (Wound / Incision (Open or Dehisced) Arm Left abrasion. Wound / Incision (Open or Dehisced) 08/29/23 Skin tear Elbow Right)                       Personal Care Assistance Level of Assistance  Bathing, Feeding, Dressing Bathing Assistance: Limited assistance Feeding assistance: Limited assistance Dressing Assistance: Maximum assistance     Functional Limitations Info  Sight, Hearing, Speech Sight Info: Impaired Hearing Info: Impaired Speech Info: Adequate    SPECIAL CARE FACTORS FREQUENCY  PT (By licensed PT), OT (By licensed OT)     PT Frequency: Eval/treat OT Frequency:Eval/treat           Contractures Contractures Info: Not present    Additional Factors Info  Code Status, Allergies Code Status Info: DNR- Comfort Allergies Info: Ticagrelor  Coffea Arabica  Simvastatin  Tetanus Toxoids  Spiriva Respimat (Tiotropium Bromide Monohydrate)  Niacin           Current Medications (08/30/2023):  This is the current hospital active medication list Current Facility-Administered Medications  Medication Dose Route Frequency Provider Last Rate Last Admin   acetaminophen (TYLENOL) tablet 650 mg  650 mg Oral Q6H PRN Crosley, Debby, MD   650 mg at 08/29/23 1023   Or   acetaminophen (TYLENOL) suppository 650 mg  650 mg Rectal Q6H PRN Crosley, Debby, MD       albuterol (PROVENTIL) (2.5 MG/3ML) 0.083% nebulizer solution 2.5 mg  2.5 mg Inhalation Q6H PRN Dorcas Carrow, MD       alum & mag  hydroxide-simeth (MAALOX/MYLANTA) 200-200-20 MG/5ML suspension 20 mL  20 mL Oral Q6H PRN Dorcas Carrow, MD       ascorbic acid (VITAMIN C) tablet 500 mg  500 mg Oral Daily Dorcas Carrow, MD   500 mg at 08/30/23 1158   aspirin EC tablet 81 mg  81 mg Oral Daily Dorcas Carrow, MD   81 mg at 08/30/23 1158   atorvastatin (LIPITOR) tablet 20 mg  20 mg Oral Daily Dorcas Carrow, MD   20 mg at 08/30/23 1158   baclofen (LIORESAL) tablet 5 mg  5 mg Oral Q8H PRN  Dorcas Carrow, MD       calcium-vitamin D (OSCAL WITH D) 500-5 MG-MCG per tablet 1 tablet  1 tablet Oral Q breakfast Dorcas Carrow, MD   1 tablet at 08/30/23 2952   cholecalciferol (VITAMIN D3) 25 MCG (1000 UNIT) tablet 1,000 Units  1,000 Units Oral Daily Dorcas Carrow, MD   1,000 Units at 08/30/23 1158   docusate sodium (COLACE) capsule 100 mg  100 mg Oral BID Dorcas Carrow, MD   100 mg at 08/30/23 1158   enoxaparin (LOVENOX) injection 40 mg  40 mg Subcutaneous Daily Crosley, Debby, MD   40 mg at 08/30/23 1159   ferrous sulfate tablet 325 mg  325 mg Oral Q M,W,F Dorcas Carrow, MD   325 mg at 08/29/23 1011   gabapentin (NEURONTIN) capsule 300 mg  300 mg Oral BID Dorcas Carrow, MD   300 mg at 08/30/23 1158   ipratropium-albuterol (DUONEB) 0.5-2.5 (3) MG/3ML nebulizer solution 3 mL  3 mL Nebulization Q6H PRN Ghimire, Lyndel Safe, MD       latanoprost (XALATAN) 0.005 % ophthalmic solution 1 drop  1 drop Both Eyes QHS Dorcas Carrow, MD   1 drop at 08/29/23 2101   mometasone-formoterol (DULERA) 200-5 MCG/ACT inhaler 2 puff  2 puff Inhalation BID Dorcas Carrow, MD   2 puff at 08/29/23 0717   multivitamin with minerals tablet 1 tablet  1 tablet Oral Daily Dorcas Carrow, MD   1 tablet at 08/30/23 1158   ondansetron (ZOFRAN) tablet 4 mg  4 mg Oral Q6H PRN Crosley, Debby, MD       Or   ondansetron (ZOFRAN) injection 4 mg  4 mg Intravenous Q6H PRN Crosley, Debby, MD       pantoprazole (PROTONIX) EC tablet 40 mg  40 mg Oral BID Dorcas Carrow, MD   40 mg at 08/30/23 1158   polyethylene glycol (MIRALAX / GLYCOLAX) packet 17 g  17 g Oral Daily Dorcas Carrow, MD   17 g at 08/29/23 1011   polyvinyl alcohol (LIQUIFILM TEARS) 1.4 % ophthalmic solution 1 drop  1 drop Both Eyes TID AC & HS Ghimire, Lyndel Safe, MD   1 drop at 08/30/23 1159   senna-docusate (Senokot-S) tablet 1 tablet  1 tablet Oral QHS PRN Crosley, Debby, MD       sodium chloride tablet 1 g  1 g Oral TID WC Dorcas Carrow, MD   1 g at 08/30/23 1158    tamsulosin (FLOMAX) capsule 0.8 mg  0.8 mg Oral QHS Dorcas Carrow, MD   0.8 mg at 08/29/23 2100   traZODone (DESYREL) tablet 25 mg  25 mg Oral QHS Dorcas Carrow, MD   25 mg at 08/29/23 2100     Discharge Medications: TAKE these medications     acetaminophen 500 MG tablet Commonly known as: TYLENOL Take 1,000 mg by mouth every 8 (eight) hours as needed for fever.    albuterol  108 (90 Base) MCG/ACT inhaler Commonly known as: VENTOLIN HFA Inhale 2 puffs into the lungs every 6 (six) hours as needed for wheezing or shortness of breath.    aluminum-magnesium hydroxide 200-200 MG/5ML suspension Take 20 mLs by mouth every 6 (six) hours as needed for indigestion.    apixaban 5 MG Tabs tablet Commonly known as: ELIQUIS Take 1 tablet (5 mg total) by mouth 2 (two) times daily.    ascorbic acid 500 MG tablet Commonly known as: VITAMIN C Take 500 mg by mouth daily.    aspirin EC 81 MG tablet Take 81 mg by mouth daily.    atorvastatin 40 MG tablet Commonly known as: LIPITOR Take 0.5 tablets (20 mg total) by mouth daily.    Baclofen 5 MG Tabs Take 5 mg by mouth every 8 (eight) hours as needed (for muscle spasms/pain).    Calcium 500 +D 500-10 MG-MCG Tabs Generic drug: Calcium Carb-Cholecalciferol Take 1 tablet by mouth daily.    cholecalciferol 25 MCG (1000 UNIT) tablet Commonly known as: VITAMIN D3 Take 1,000 Units by mouth daily.    docusate sodium 100 MG capsule Commonly known as: COLACE Take 100 mg by mouth 2 (two) times daily.    ferrous sulfate 325 (65 FE) MG EC tablet Take 325 mg by mouth every Monday, Wednesday, and Friday.    furosemide 20 MG tablet Commonly known as: LASIX Take 20 mg by mouth daily as needed (Overload: Weight gain of 3 pounds, 5 pounds, weight gain over a week, shortness of breath and/or swelling).    gabapentin 300 MG capsule Commonly known as: NEURONTIN Take 1 capsule (300 mg total) by mouth 2 (two) times daily.    guaiFENesin 600 MG 12 hr  tablet Commonly known as: MUCINEX Take 600 mg by mouth 2 (two) times daily as needed for cough or to loosen phlegm.    ipratropium-albuterol 0.5-2.5 (3) MG/3ML Soln Commonly known as: DUONEB Take 3 mLs by nebulization every 6 (six) hours as needed (for shortness of breath and wheezing).    latanoprost 0.005 % ophthalmic solution Commonly known as: XALATAN Place 1 drop into both eyes at bedtime.    lisinopril 10 MG tablet Commonly known as: ZESTRIL Take 5 mg by mouth daily. Hold for SBP less than 100.    Lutein 10 MG Tabs Take 10 mg by mouth in the morning.    multivitamin tablet Take 1 tablet by mouth daily. Multivitamin + Folic acid 400 mcg.    nystatin powder Apply 1 Application topically every 12 (twelve) hours as needed (irritation).    OVER THE COUNTER MEDICATION Take 1 capsule by mouth at bedtime. Relaxium Sleep    pantoprazole 40 MG tablet Commonly known as: PROTONIX Take 1 tablet (40 mg total) by mouth 2 (two) times daily before a meal. What changed: when to take this    polyethylene glycol 17 g packet Commonly known as: MIRALAX / GLYCOLAX Take 17 g by mouth daily.    Refresh Tears 0.5 % Soln Generic drug: carboxymethylcellulose Place 1 drop into both eyes 4 (four) times daily.    Risa-Bid Probiotic Tabs Take 1 tablet by mouth in the morning.    sennosides-docusate sodium 8.6-50 MG tablet Commonly known as: SENOKOT-S Take 1 tablet by mouth daily as needed for constipation.    Simbrinza 1-0.2 % Susp Generic drug: Brinzolamide-Brimonidine Place 1 drop into both eyes 2 (two) times daily.    sodium chloride 1 g tablet Take 1 tablet (1 g total) by mouth  3 (three) times daily with meals.    tamsulosin 0.4 MG Caps capsule Commonly known as: FLOMAX Take 2 capsules (0.8 mg total) by mouth at bedtime.    traZODone 50 MG tablet Commonly known as: DESYREL Take 0.5 tablets (25 mg total) by mouth at bedtime.    Tudorza Pressair 400 MCG/ACT Aepb Generic drug:  Aclidinium Bromide Inhale 1 puff into the lungs in the morning and at bedtime.    Wixela Inhub 250-50 MCG/ACT Aepb Generic drug: fluticasone-salmeterol Inhale 1 puff into the lungs in the morning and at bedtime.    Relevant Imaging Results:  Relevant Lab Results:   Additional Information SSN: [id number]  Japji Kok A [place], [place]

## 2023-08-30 NOTE — Progress Notes (Signed)
Occupational Therapy Treatment Patient Details Name: Marcus Martin MRN: [id number] DOB: 08/07/1933 Today's Date: 08/30/2023   History of present illness 89 yo male admitted 9/22 from [facility] after fall. PMhx:HOH, SIADH and chronic hyponatremia, HTN, HLD, DVT, COPD, CAD, OSA, C2 fx   OT comments  Patient demonstrating good gains with OT treatment for bed mobility, transfers, and standing at sink for grooming and UB bathing tasks. Patient will benefit from continued inpatient follow up therapy, <3 hours/day to continue to address self care and functional transfers.       If plan is discharge home, recommend the following:  A lot of help with bathing/dressing/bathroom;Assistance with cooking/housework;Direct supervision/assist for medications management;Direct supervision/assist for financial management;Assist for transportation;Help with stairs or ramp for entrance;A little help with walking and/or transfers   Equipment Recommendations  None recommended by OT    Recommendations for Other Services      Precautions / Restrictions Precautions Precautions: Fall Precaution Comments: HOH Restrictions Weight Bearing Restrictions: No       Mobility Bed Mobility Overal bed mobility: Needs Assistance Bed Mobility: Supine to Sit     Supine to sit: Min assist     General bed mobility comments: verbal cues and min assist to with patient pulling up with therapist    Transfers Overall transfer level: Needs assistance Equipment used: Rolling walker (2 wheels) Transfers: Sit to/from Stand, Bed to chair/wheelchair/BSC Sit to Stand: Min assist           General transfer comment: min assist to power up and min assist to CGA for transfer with cues for hand placement     Balance Overall balance assessment: History of Falls, Needs assistance Sitting-balance support: No upper extremity supported, Feet supported Sitting balance-Leahy Scale: Fair Sitting balance - Comments:  EOb without support   Standing balance support: Single extremity supported, Bilateral upper extremity supported, During functional activity Standing balance-Leahy Scale: Poor Standing balance comment: standing at sink with one extremity support                           ADL either performed or assessed with clinical judgement   ADL Overall ADL's : Needs assistance/impaired Eating/Feeding: Set up;Sitting   Grooming: Wash/dry hands;Wash/dry face;Oral care;Contact guard assist;Standing Grooming Details (indicate cue type and reason): standing at sink Upper Body Bathing: Contact guard assist;Standing               Toilet Transfer: Minimal assistance;Ambulation;Rolling walker (2 wheels) Toilet Transfer Details (indicate cue type and reason): simulated to recliner                Extremity/Trunk Assessment              Vision       Perception     Praxis      Cognition Arousal: Alert Behavior During Therapy: WFL for tasks assessed/performed Overall Cognitive Status: Impaired/Different from baseline Area of Impairment: Orientation, Attention, Memory, Following commands, Safety/judgement                 Orientation Level: Disoriented to, Time, Situation, Place Current Attention Level: Sustained Memory: Decreased short-term memory Following Commands: Follows one step commands consistently, Follows one step commands with increased time Safety/Judgement: Decreased awareness of deficits     General Comments: cues for safety [facility]        Exercises      Shoulder Instructions       General Comments  Pertinent Vitals/ Pain       Pain Assessment Pain Assessment: No/denies pain  Home Living                                          Prior Functioning/Environment              Frequency  Min 1X/week        Progress Toward Goals  OT Goals(current goals can now be found in the care plan section)  Progress  towards OT goals: Progressing toward goals  Acute Rehab OT Goals Patient Stated Goal: none stated OT Goal Formulation: With patient Time For Goal Achievement: 09/11/23 Potential to Achieve Goals: Good ADL Goals Pt Will Perform Grooming: with supervision;standing Pt Will Perform Lower Body Dressing: with min assist;sit to/from stand Pt Will Transfer to Toilet: with supervision;ambulating Pt Will Perform Toileting - Clothing Manipulation and hygiene: with supervision;sitting/lateral leans;sit to/from stand  Plan      Co-evaluation                 AM-PAC OT "6 Clicks" Daily Activity     Outcome Measure   Help from another person eating meals?: A Little Help from another person taking care of personal grooming?: A Little Help from another person toileting, which includes using toliet, bedpan, or urinal?: A Lot Help from another person bathing (including washing, rinsing, drying)?: A Lot Help from another person to put on and taking off regular upper body clothing?: A Little Help from another person to put on and taking off regular lower body clothing?: A Lot 6 Click Score: 15    End of Session Equipment Utilized During Treatment: Gait belt;Rolling walker (2 wheels)  OT Visit Diagnosis: Unsteadiness on feet (R26.81);Other abnormalities of gait and mobility (R26.89);Muscle weakness (generalized) (M62.81)   Activity Tolerance Patient tolerated treatment well   Patient Left in chair;with call bell/phone within reach;with chair alarm set   Nurse Communication Mobility status        Time: [phone] OT Time Calculation (min): 22 min  Charges: OT General Charges $OT Visit: 1 Visit OT Treatments $Self Care/Home Management : 8-22 mins  Alfonse Flavors, OTA Acute Rehabilitation Services  Office [phone]   Dewain Penning 08/30/2023, 9:42 AM

## 2023-08-30 NOTE — TOC Transition Note (Signed)
Transition of Care [facility]) - CM/SW Discharge Note   Patient Details  Name: Marcus Martin MRN: [id number] Date of Birth: [birth year]  Transition of Care [facility]) CM/SW Contact:  Meleni Delahunt A [place], Theresia Majors Phone Number: 08/30/2023, 1:49 PM   Clinical Narrative:     Patient will DC to: [facility]  Anticipated DC date: 08/30/23  Family notified: [name] Theme park manager by: Sharin Mons      Per MD patient ready for DC to [facility]. RN, patient, patient's family, and facility notified of DC. Discharge Summary and FL2 sent to facility. RN to call report prior to discharge ( [phone]). DC packet on chart. Ambulance transport requested for patient.     CSW will sign off for now as social work intervention is no longer needed. Please consult [place] again if new needs arise.   Final next level of care: Skilled Nursing Facility Barriers to Discharge: Barriers Resolved   Patient Goals and CMS Choice      Discharge Placement                Patient chooses bed at: [facility] Patient to be transferred to facility by: [facility] Name of family member notified: Johnsie Kindred Patient and family notified of of transfer: 08/30/23  Discharge Plan and Services Additional resources added to the After Visit Summary for                                       Social Determinants of Health (SDOH) Interventions SDOH Screenings   Food Insecurity: No Food Insecurity (08/27/2023)  Housing: Low Risk  (08/27/2023)  Transportation Needs: No Transportation Needs (08/27/2023)  Utilities: Not At Risk (08/27/2023)  Social Connections: Unknown (04/14/2022)   Received from [facility]  Stress: Stress Concern Present (05/19/2023)   Received from [facility]  Tobacco Use: Medium Risk (08/26/2023)     Readmission Risk Interventions    08/29/2023    4:35 PM  Readmission Risk Prevention Plan  Transportation Screening Complete  Medication Review  (RN Care Manager) Complete  PCP or Specialist appointment within 3-5 days of discharge Complete  HRI or Home Care Consult Complete  SW Recovery Care/Counseling Consult Complete  Palliative Care Screening Complete  Skilled Nursing Facility Complete

## 2023-08-31 DIAGNOSIS — I4891 Unspecified atrial fibrillation: Secondary | ICD-10-CM | POA: Diagnosis not present

## 2023-08-31 DIAGNOSIS — J449 Chronic obstructive pulmonary disease, unspecified: Secondary | ICD-10-CM | POA: Diagnosis not present

## 2023-08-31 DIAGNOSIS — E785 Hyperlipidemia, unspecified: Secondary | ICD-10-CM | POA: Diagnosis not present

## 2023-08-31 DIAGNOSIS — N189 Chronic kidney disease, unspecified: Secondary | ICD-10-CM | POA: Diagnosis not present

## 2023-08-31 DIAGNOSIS — D649 Anemia, unspecified: Secondary | ICD-10-CM | POA: Diagnosis not present

## 2023-08-31 DIAGNOSIS — I10 Essential (primary) hypertension: Secondary | ICD-10-CM | POA: Diagnosis not present

## 2023-09-01 DIAGNOSIS — I10 Essential (primary) hypertension: Secondary | ICD-10-CM | POA: Diagnosis not present

## 2023-09-03 DIAGNOSIS — R600 Localized edema: Secondary | ICD-10-CM | POA: Diagnosis not present

## 2023-09-03 DIAGNOSIS — D649 Anemia, unspecified: Secondary | ICD-10-CM | POA: Diagnosis not present

## 2023-09-03 DIAGNOSIS — G4700 Insomnia, unspecified: Secondary | ICD-10-CM | POA: Diagnosis not present

## 2023-09-03 DIAGNOSIS — E871 Hypo-osmolality and hyponatremia: Secondary | ICD-10-CM | POA: Diagnosis not present

## 2023-09-03 DIAGNOSIS — K2100 Gastro-esophageal reflux disease with esophagitis, without bleeding: Secondary | ICD-10-CM | POA: Diagnosis not present

## 2023-09-03 DIAGNOSIS — I4891 Unspecified atrial fibrillation: Secondary | ICD-10-CM | POA: Diagnosis not present

## 2023-09-03 DIAGNOSIS — E222 Syndrome of inappropriate secretion of antidiuretic hormone: Secondary | ICD-10-CM | POA: Diagnosis not present

## 2023-09-03 DIAGNOSIS — I10 Essential (primary) hypertension: Secondary | ICD-10-CM | POA: Diagnosis not present

## 2023-09-03 DIAGNOSIS — K5900 Constipation, unspecified: Secondary | ICD-10-CM | POA: Diagnosis not present

## 2023-09-03 DIAGNOSIS — G4733 Obstructive sleep apnea (adult) (pediatric): Secondary | ICD-10-CM | POA: Diagnosis not present

## 2023-09-03 DIAGNOSIS — J449 Chronic obstructive pulmonary disease, unspecified: Secondary | ICD-10-CM | POA: Diagnosis not present

## 2023-09-04 DIAGNOSIS — S51001A Unspecified open wound of right elbow, initial encounter: Secondary | ICD-10-CM | POA: Diagnosis not present

## 2023-09-04 DIAGNOSIS — S41102A Unspecified open wound of left upper arm, initial encounter: Secondary | ICD-10-CM | POA: Diagnosis not present

## 2023-09-04 DIAGNOSIS — S51802A Unspecified open wound of left forearm, initial encounter: Secondary | ICD-10-CM | POA: Diagnosis not present

## 2023-09-10 DIAGNOSIS — D649 Anemia, unspecified: Secondary | ICD-10-CM | POA: Diagnosis not present

## 2023-09-10 DIAGNOSIS — I10 Essential (primary) hypertension: Secondary | ICD-10-CM | POA: Diagnosis not present

## 2023-09-11 DIAGNOSIS — L988 Other specified disorders of the skin and subcutaneous tissue: Secondary | ICD-10-CM | POA: Diagnosis not present

## 2023-09-18 DIAGNOSIS — L988 Other specified disorders of the skin and subcutaneous tissue: Secondary | ICD-10-CM | POA: Diagnosis not present

## 2023-09-19 DIAGNOSIS — I10 Essential (primary) hypertension: Secondary | ICD-10-CM | POA: Diagnosis not present

## 2023-09-19 DIAGNOSIS — K5900 Constipation, unspecified: Secondary | ICD-10-CM | POA: Diagnosis not present

## 2023-09-19 DIAGNOSIS — E785 Hyperlipidemia, unspecified: Secondary | ICD-10-CM | POA: Diagnosis not present

## 2023-09-19 DIAGNOSIS — K2100 Gastro-esophageal reflux disease with esophagitis, without bleeding: Secondary | ICD-10-CM | POA: Diagnosis not present

## 2023-09-19 DIAGNOSIS — E222 Syndrome of inappropriate secretion of antidiuretic hormone: Secondary | ICD-10-CM | POA: Diagnosis not present

## 2023-09-19 DIAGNOSIS — M79652 Pain in left thigh: Secondary | ICD-10-CM | POA: Diagnosis not present

## 2023-09-19 DIAGNOSIS — J449 Chronic obstructive pulmonary disease, unspecified: Secondary | ICD-10-CM | POA: Diagnosis not present

## 2023-09-19 DIAGNOSIS — E871 Hypo-osmolality and hyponatremia: Secondary | ICD-10-CM | POA: Diagnosis not present

## 2023-09-19 DIAGNOSIS — I4891 Unspecified atrial fibrillation: Secondary | ICD-10-CM | POA: Diagnosis not present

## 2023-09-19 DIAGNOSIS — R600 Localized edema: Secondary | ICD-10-CM | POA: Diagnosis not present

## 2023-09-19 DIAGNOSIS — G4733 Obstructive sleep apnea (adult) (pediatric): Secondary | ICD-10-CM | POA: Diagnosis not present

## 2023-09-19 DIAGNOSIS — M25552 Pain in left hip: Secondary | ICD-10-CM | POA: Diagnosis not present

## 2023-09-19 DIAGNOSIS — G629 Polyneuropathy, unspecified: Secondary | ICD-10-CM | POA: Diagnosis not present

## 2023-09-20 DIAGNOSIS — I4891 Unspecified atrial fibrillation: Secondary | ICD-10-CM | POA: Diagnosis not present

## 2023-09-20 DIAGNOSIS — I70223 Atherosclerosis of native arteries of extremities with rest pain, bilateral legs: Secondary | ICD-10-CM | POA: Diagnosis not present

## 2023-09-20 DIAGNOSIS — J449 Chronic obstructive pulmonary disease, unspecified: Secondary | ICD-10-CM | POA: Diagnosis not present

## 2023-09-20 DIAGNOSIS — I10 Essential (primary) hypertension: Secondary | ICD-10-CM | POA: Diagnosis not present

## 2023-09-20 DIAGNOSIS — N189 Chronic kidney disease, unspecified: Secondary | ICD-10-CM | POA: Diagnosis not present

## 2023-09-20 DIAGNOSIS — E785 Hyperlipidemia, unspecified: Secondary | ICD-10-CM | POA: Diagnosis not present

## 2023-09-25 DIAGNOSIS — D649 Anemia, unspecified: Secondary | ICD-10-CM | POA: Diagnosis not present

## 2023-09-25 DIAGNOSIS — S91301A Unspecified open wound, right foot, initial encounter: Secondary | ICD-10-CM | POA: Diagnosis not present

## 2023-09-25 DIAGNOSIS — S31829A Unspecified open wound of left buttock, initial encounter: Secondary | ICD-10-CM | POA: Diagnosis not present

## 2023-10-01 DIAGNOSIS — I10 Essential (primary) hypertension: Secondary | ICD-10-CM | POA: Diagnosis not present

## 2023-10-02 DIAGNOSIS — S31829A Unspecified open wound of left buttock, initial encounter: Secondary | ICD-10-CM | POA: Diagnosis not present

## 2023-10-02 DIAGNOSIS — S31819A Unspecified open wound of right buttock, initial encounter: Secondary | ICD-10-CM | POA: Diagnosis not present

## 2023-10-03 DIAGNOSIS — I10 Essential (primary) hypertension: Secondary | ICD-10-CM | POA: Diagnosis not present

## 2023-10-03 DIAGNOSIS — K2100 Gastro-esophageal reflux disease with esophagitis, without bleeding: Secondary | ICD-10-CM | POA: Diagnosis not present

## 2023-10-03 DIAGNOSIS — I4891 Unspecified atrial fibrillation: Secondary | ICD-10-CM | POA: Diagnosis not present

## 2023-10-03 DIAGNOSIS — R600 Localized edema: Secondary | ICD-10-CM | POA: Diagnosis not present

## 2023-10-03 DIAGNOSIS — G4733 Obstructive sleep apnea (adult) (pediatric): Secondary | ICD-10-CM | POA: Diagnosis not present

## 2023-10-03 DIAGNOSIS — K5900 Constipation, unspecified: Secondary | ICD-10-CM | POA: Diagnosis not present

## 2023-10-03 DIAGNOSIS — E871 Hypo-osmolality and hyponatremia: Secondary | ICD-10-CM | POA: Diagnosis not present

## 2023-10-03 DIAGNOSIS — J449 Chronic obstructive pulmonary disease, unspecified: Secondary | ICD-10-CM | POA: Diagnosis not present

## 2023-10-03 DIAGNOSIS — G4700 Insomnia, unspecified: Secondary | ICD-10-CM | POA: Diagnosis not present

## 2023-10-03 DIAGNOSIS — E785 Hyperlipidemia, unspecified: Secondary | ICD-10-CM | POA: Diagnosis not present

## 2023-10-03 DIAGNOSIS — E222 Syndrome of inappropriate secretion of antidiuretic hormone: Secondary | ICD-10-CM | POA: Diagnosis not present

## 2023-10-03 DIAGNOSIS — D649 Anemia, unspecified: Secondary | ICD-10-CM | POA: Diagnosis not present

## 2023-10-04 DIAGNOSIS — N390 Urinary tract infection, site not specified: Secondary | ICD-10-CM | POA: Diagnosis not present

## 2023-10-08 DIAGNOSIS — K2100 Gastro-esophageal reflux disease with esophagitis, without bleeding: Secondary | ICD-10-CM | POA: Diagnosis not present

## 2023-10-08 DIAGNOSIS — N189 Chronic kidney disease, unspecified: Secondary | ICD-10-CM | POA: Diagnosis not present

## 2023-10-08 DIAGNOSIS — J449 Chronic obstructive pulmonary disease, unspecified: Secondary | ICD-10-CM | POA: Diagnosis not present

## 2023-10-08 DIAGNOSIS — I10 Essential (primary) hypertension: Secondary | ICD-10-CM | POA: Diagnosis not present

## 2023-10-08 DIAGNOSIS — L853 Xerosis cutis: Secondary | ICD-10-CM | POA: Diagnosis not present

## 2023-10-08 DIAGNOSIS — G4700 Insomnia, unspecified: Secondary | ICD-10-CM | POA: Diagnosis not present

## 2023-10-08 DIAGNOSIS — L299 Pruritus, unspecified: Secondary | ICD-10-CM | POA: Diagnosis not present

## 2023-10-08 DIAGNOSIS — E222 Syndrome of inappropriate secretion of antidiuretic hormone: Secondary | ICD-10-CM | POA: Diagnosis not present

## 2023-10-08 DIAGNOSIS — G4733 Obstructive sleep apnea (adult) (pediatric): Secondary | ICD-10-CM | POA: Diagnosis not present

## 2023-10-08 DIAGNOSIS — R600 Localized edema: Secondary | ICD-10-CM | POA: Diagnosis not present

## 2023-10-08 DIAGNOSIS — K5900 Constipation, unspecified: Secondary | ICD-10-CM | POA: Diagnosis not present

## 2023-10-08 DIAGNOSIS — I998 Other disorder of circulatory system: Secondary | ICD-10-CM | POA: Diagnosis not present

## 2023-10-09 DIAGNOSIS — S31829A Unspecified open wound of left buttock, initial encounter: Secondary | ICD-10-CM | POA: Diagnosis not present

## 2023-10-09 DIAGNOSIS — S31819A Unspecified open wound of right buttock, initial encounter: Secondary | ICD-10-CM | POA: Diagnosis not present

## 2023-10-11 DIAGNOSIS — L853 Xerosis cutis: Secondary | ICD-10-CM | POA: Diagnosis not present

## 2023-10-11 DIAGNOSIS — I4891 Unspecified atrial fibrillation: Secondary | ICD-10-CM | POA: Diagnosis not present

## 2023-10-11 DIAGNOSIS — E785 Hyperlipidemia, unspecified: Secondary | ICD-10-CM | POA: Diagnosis not present

## 2023-10-11 DIAGNOSIS — E222 Syndrome of inappropriate secretion of antidiuretic hormone: Secondary | ICD-10-CM | POA: Diagnosis not present

## 2023-10-11 DIAGNOSIS — K2100 Gastro-esophageal reflux disease with esophagitis, without bleeding: Secondary | ICD-10-CM | POA: Diagnosis not present

## 2023-10-11 DIAGNOSIS — L299 Pruritus, unspecified: Secondary | ICD-10-CM | POA: Diagnosis not present

## 2023-10-11 DIAGNOSIS — I70223 Atherosclerosis of native arteries of extremities with rest pain, bilateral legs: Secondary | ICD-10-CM | POA: Diagnosis not present

## 2023-10-11 DIAGNOSIS — E871 Hypo-osmolality and hyponatremia: Secondary | ICD-10-CM | POA: Diagnosis not present

## 2023-10-11 DIAGNOSIS — K5900 Constipation, unspecified: Secondary | ICD-10-CM | POA: Diagnosis not present

## 2023-10-11 DIAGNOSIS — R600 Localized edema: Secondary | ICD-10-CM | POA: Diagnosis not present

## 2023-10-11 DIAGNOSIS — J449 Chronic obstructive pulmonary disease, unspecified: Secondary | ICD-10-CM | POA: Diagnosis not present

## 2023-10-11 DIAGNOSIS — G4700 Insomnia, unspecified: Secondary | ICD-10-CM | POA: Diagnosis not present

## 2023-10-11 DIAGNOSIS — I10 Essential (primary) hypertension: Secondary | ICD-10-CM | POA: Diagnosis not present

## 2023-10-11 DIAGNOSIS — G4733 Obstructive sleep apnea (adult) (pediatric): Secondary | ICD-10-CM | POA: Diagnosis not present

## 2023-10-11 DIAGNOSIS — N189 Chronic kidney disease, unspecified: Secondary | ICD-10-CM | POA: Diagnosis not present

## 2023-10-16 DIAGNOSIS — E785 Hyperlipidemia, unspecified: Secondary | ICD-10-CM | POA: Diagnosis not present

## 2023-10-16 DIAGNOSIS — E871 Hypo-osmolality and hyponatremia: Secondary | ICD-10-CM | POA: Diagnosis not present

## 2023-10-16 DIAGNOSIS — I4891 Unspecified atrial fibrillation: Secondary | ICD-10-CM | POA: Diagnosis not present

## 2023-10-16 DIAGNOSIS — E222 Syndrome of inappropriate secretion of antidiuretic hormone: Secondary | ICD-10-CM | POA: Diagnosis not present

## 2023-10-16 DIAGNOSIS — I998 Other disorder of circulatory system: Secondary | ICD-10-CM | POA: Diagnosis not present

## 2023-10-16 DIAGNOSIS — K2100 Gastro-esophageal reflux disease with esophagitis, without bleeding: Secondary | ICD-10-CM | POA: Diagnosis not present

## 2023-10-16 DIAGNOSIS — K5900 Constipation, unspecified: Secondary | ICD-10-CM | POA: Diagnosis not present

## 2023-10-16 DIAGNOSIS — G4700 Insomnia, unspecified: Secondary | ICD-10-CM | POA: Diagnosis not present

## 2023-10-16 DIAGNOSIS — J449 Chronic obstructive pulmonary disease, unspecified: Secondary | ICD-10-CM | POA: Diagnosis not present

## 2023-10-16 DIAGNOSIS — R600 Localized edema: Secondary | ICD-10-CM | POA: Diagnosis not present

## 2023-10-16 DIAGNOSIS — I10 Essential (primary) hypertension: Secondary | ICD-10-CM | POA: Diagnosis not present

## 2023-10-16 DIAGNOSIS — G4733 Obstructive sleep apnea (adult) (pediatric): Secondary | ICD-10-CM | POA: Diagnosis not present

## 2023-10-17 DIAGNOSIS — K2100 Gastro-esophageal reflux disease with esophagitis, without bleeding: Secondary | ICD-10-CM | POA: Diagnosis not present

## 2023-10-17 DIAGNOSIS — K5900 Constipation, unspecified: Secondary | ICD-10-CM | POA: Diagnosis not present

## 2023-10-17 DIAGNOSIS — G4700 Insomnia, unspecified: Secondary | ICD-10-CM | POA: Diagnosis not present

## 2023-10-17 DIAGNOSIS — G629 Polyneuropathy, unspecified: Secondary | ICD-10-CM | POA: Diagnosis not present

## 2023-10-17 DIAGNOSIS — G4733 Obstructive sleep apnea (adult) (pediatric): Secondary | ICD-10-CM | POA: Diagnosis not present

## 2023-10-17 DIAGNOSIS — J449 Chronic obstructive pulmonary disease, unspecified: Secondary | ICD-10-CM | POA: Diagnosis not present

## 2023-10-17 DIAGNOSIS — E871 Hypo-osmolality and hyponatremia: Secondary | ICD-10-CM | POA: Diagnosis not present

## 2023-10-17 DIAGNOSIS — D649 Anemia, unspecified: Secondary | ICD-10-CM | POA: Diagnosis not present

## 2023-10-17 DIAGNOSIS — L853 Xerosis cutis: Secondary | ICD-10-CM | POA: Diagnosis not present

## 2023-10-17 DIAGNOSIS — N189 Chronic kidney disease, unspecified: Secondary | ICD-10-CM | POA: Diagnosis not present

## 2023-10-17 DIAGNOSIS — R5381 Other malaise: Secondary | ICD-10-CM | POA: Diagnosis not present

## 2023-10-17 DIAGNOSIS — R600 Localized edema: Secondary | ICD-10-CM | POA: Diagnosis not present

## 2023-10-18 DIAGNOSIS — E871 Hypo-osmolality and hyponatremia: Secondary | ICD-10-CM | POA: Diagnosis not present

## 2023-10-20 DIAGNOSIS — E871 Hypo-osmolality and hyponatremia: Secondary | ICD-10-CM | POA: Diagnosis not present

## 2023-10-24 DIAGNOSIS — I7091 Generalized atherosclerosis: Secondary | ICD-10-CM | POA: Diagnosis not present

## 2023-11-02 DIAGNOSIS — I10 Essential (primary) hypertension: Secondary | ICD-10-CM | POA: Diagnosis not present

## 2023-11-09 DIAGNOSIS — E785 Hyperlipidemia, unspecified: Secondary | ICD-10-CM | POA: Diagnosis not present

## 2023-11-09 DIAGNOSIS — I4891 Unspecified atrial fibrillation: Secondary | ICD-10-CM | POA: Diagnosis not present

## 2023-11-09 DIAGNOSIS — I70223 Atherosclerosis of native arteries of extremities with rest pain, bilateral legs: Secondary | ICD-10-CM | POA: Diagnosis not present

## 2023-11-09 DIAGNOSIS — N189 Chronic kidney disease, unspecified: Secondary | ICD-10-CM | POA: Diagnosis not present

## 2023-11-09 DIAGNOSIS — I10 Essential (primary) hypertension: Secondary | ICD-10-CM | POA: Diagnosis not present

## 2023-11-09 DIAGNOSIS — J449 Chronic obstructive pulmonary disease, unspecified: Secondary | ICD-10-CM | POA: Diagnosis not present

## 2023-11-13 DIAGNOSIS — D649 Anemia, unspecified: Secondary | ICD-10-CM | POA: Diagnosis not present

## 2023-11-13 DIAGNOSIS — L24A2 Irritant contact dermatitis due to fecal, urinary or dual incontinence: Secondary | ICD-10-CM | POA: Diagnosis not present

## 2023-11-20 DIAGNOSIS — L24A2 Irritant contact dermatitis due to fecal, urinary or dual incontinence: Secondary | ICD-10-CM | POA: Diagnosis not present

## 2023-11-21 DIAGNOSIS — J449 Chronic obstructive pulmonary disease, unspecified: Secondary | ICD-10-CM | POA: Diagnosis not present

## 2023-11-21 DIAGNOSIS — E871 Hypo-osmolality and hyponatremia: Secondary | ICD-10-CM | POA: Diagnosis not present

## 2023-11-21 DIAGNOSIS — G629 Polyneuropathy, unspecified: Secondary | ICD-10-CM | POA: Diagnosis not present

## 2023-11-21 DIAGNOSIS — R600 Localized edema: Secondary | ICD-10-CM | POA: Diagnosis not present

## 2023-11-21 DIAGNOSIS — K5900 Constipation, unspecified: Secondary | ICD-10-CM | POA: Diagnosis not present

## 2023-11-21 DIAGNOSIS — E222 Syndrome of inappropriate secretion of antidiuretic hormone: Secondary | ICD-10-CM | POA: Diagnosis not present

## 2023-11-21 DIAGNOSIS — I4891 Unspecified atrial fibrillation: Secondary | ICD-10-CM | POA: Diagnosis not present

## 2023-11-21 DIAGNOSIS — D649 Anemia, unspecified: Secondary | ICD-10-CM | POA: Diagnosis not present

## 2023-11-21 DIAGNOSIS — G4700 Insomnia, unspecified: Secondary | ICD-10-CM | POA: Diagnosis not present

## 2023-11-21 DIAGNOSIS — L853 Xerosis cutis: Secondary | ICD-10-CM | POA: Diagnosis not present

## 2023-11-21 DIAGNOSIS — K2100 Gastro-esophageal reflux disease with esophagitis, without bleeding: Secondary | ICD-10-CM | POA: Diagnosis not present

## 2023-11-27 DIAGNOSIS — E871 Hypo-osmolality and hyponatremia: Secondary | ICD-10-CM | POA: Diagnosis not present

## 2023-11-27 DIAGNOSIS — L24A2 Irritant contact dermatitis due to fecal, urinary or dual incontinence: Secondary | ICD-10-CM | POA: Diagnosis not present

## 2023-12-02 DIAGNOSIS — I10 Essential (primary) hypertension: Secondary | ICD-10-CM | POA: Diagnosis not present

## 2023-12-04 DIAGNOSIS — L24A2 Irritant contact dermatitis due to fecal, urinary or dual incontinence: Secondary | ICD-10-CM | POA: Diagnosis not present

## 2023-12-07 DIAGNOSIS — E7800 Pure hypercholesterolemia, unspecified: Secondary | ICD-10-CM | POA: Diagnosis not present

## 2023-12-13 DIAGNOSIS — D649 Anemia, unspecified: Secondary | ICD-10-CM | POA: Diagnosis not present

## 2023-12-13 DIAGNOSIS — I4891 Unspecified atrial fibrillation: Secondary | ICD-10-CM | POA: Diagnosis not present

## 2023-12-13 DIAGNOSIS — R600 Localized edema: Secondary | ICD-10-CM | POA: Diagnosis not present

## 2023-12-13 DIAGNOSIS — R5381 Other malaise: Secondary | ICD-10-CM | POA: Diagnosis not present

## 2023-12-13 DIAGNOSIS — K2100 Gastro-esophageal reflux disease with esophagitis, without bleeding: Secondary | ICD-10-CM | POA: Diagnosis not present

## 2023-12-13 DIAGNOSIS — L853 Xerosis cutis: Secondary | ICD-10-CM | POA: Diagnosis not present

## 2023-12-13 DIAGNOSIS — G4700 Insomnia, unspecified: Secondary | ICD-10-CM | POA: Diagnosis not present

## 2023-12-13 DIAGNOSIS — J449 Chronic obstructive pulmonary disease, unspecified: Secondary | ICD-10-CM | POA: Diagnosis not present

## 2023-12-13 DIAGNOSIS — I10 Essential (primary) hypertension: Secondary | ICD-10-CM | POA: Diagnosis not present

## 2023-12-13 DIAGNOSIS — K5900 Constipation, unspecified: Secondary | ICD-10-CM | POA: Diagnosis not present

## 2023-12-13 DIAGNOSIS — G4733 Obstructive sleep apnea (adult) (pediatric): Secondary | ICD-10-CM | POA: Diagnosis not present

## 2023-12-14 DIAGNOSIS — I10 Essential (primary) hypertension: Secondary | ICD-10-CM | POA: Diagnosis not present

## 2023-12-14 DIAGNOSIS — J449 Chronic obstructive pulmonary disease, unspecified: Secondary | ICD-10-CM | POA: Diagnosis not present

## 2023-12-14 DIAGNOSIS — I4891 Unspecified atrial fibrillation: Secondary | ICD-10-CM | POA: Diagnosis not present

## 2023-12-14 DIAGNOSIS — E785 Hyperlipidemia, unspecified: Secondary | ICD-10-CM | POA: Diagnosis not present

## 2023-12-14 DIAGNOSIS — N189 Chronic kidney disease, unspecified: Secondary | ICD-10-CM | POA: Diagnosis not present

## 2023-12-14 DIAGNOSIS — I70223 Atherosclerosis of native arteries of extremities with rest pain, bilateral legs: Secondary | ICD-10-CM | POA: Diagnosis not present

## 2023-12-19 DIAGNOSIS — G629 Polyneuropathy, unspecified: Secondary | ICD-10-CM | POA: Diagnosis not present

## 2023-12-19 DIAGNOSIS — L853 Xerosis cutis: Secondary | ICD-10-CM | POA: Diagnosis not present

## 2023-12-19 DIAGNOSIS — D649 Anemia, unspecified: Secondary | ICD-10-CM | POA: Diagnosis not present

## 2023-12-19 DIAGNOSIS — G4733 Obstructive sleep apnea (adult) (pediatric): Secondary | ICD-10-CM | POA: Diagnosis not present

## 2023-12-19 DIAGNOSIS — I4891 Unspecified atrial fibrillation: Secondary | ICD-10-CM | POA: Diagnosis not present

## 2023-12-19 DIAGNOSIS — R5381 Other malaise: Secondary | ICD-10-CM | POA: Diagnosis not present

## 2023-12-19 DIAGNOSIS — E785 Hyperlipidemia, unspecified: Secondary | ICD-10-CM | POA: Diagnosis not present

## 2023-12-19 DIAGNOSIS — J449 Chronic obstructive pulmonary disease, unspecified: Secondary | ICD-10-CM | POA: Diagnosis not present

## 2023-12-19 DIAGNOSIS — I10 Essential (primary) hypertension: Secondary | ICD-10-CM | POA: Diagnosis not present

## 2023-12-19 DIAGNOSIS — K5900 Constipation, unspecified: Secondary | ICD-10-CM | POA: Diagnosis not present

## 2023-12-19 DIAGNOSIS — K2100 Gastro-esophageal reflux disease with esophagitis, without bleeding: Secondary | ICD-10-CM | POA: Diagnosis not present

## 2023-12-19 DIAGNOSIS — N189 Chronic kidney disease, unspecified: Secondary | ICD-10-CM | POA: Diagnosis not present

## 2023-12-21 DIAGNOSIS — E871 Hypo-osmolality and hyponatremia: Secondary | ICD-10-CM | POA: Diagnosis not present

## 2023-12-22 DIAGNOSIS — E871 Hypo-osmolality and hyponatremia: Secondary | ICD-10-CM | POA: Diagnosis not present

## 2023-12-22 DIAGNOSIS — I10 Essential (primary) hypertension: Secondary | ICD-10-CM | POA: Diagnosis not present

## 2023-12-23 DIAGNOSIS — E871 Hypo-osmolality and hyponatremia: Secondary | ICD-10-CM | POA: Diagnosis not present

## 2023-12-24 DIAGNOSIS — R5381 Other malaise: Secondary | ICD-10-CM | POA: Diagnosis not present

## 2023-12-24 DIAGNOSIS — D649 Anemia, unspecified: Secondary | ICD-10-CM | POA: Diagnosis not present

## 2023-12-24 DIAGNOSIS — J449 Chronic obstructive pulmonary disease, unspecified: Secondary | ICD-10-CM | POA: Diagnosis not present

## 2023-12-24 DIAGNOSIS — N189 Chronic kidney disease, unspecified: Secondary | ICD-10-CM | POA: Diagnosis not present

## 2023-12-24 DIAGNOSIS — I4891 Unspecified atrial fibrillation: Secondary | ICD-10-CM | POA: Diagnosis not present

## 2023-12-24 DIAGNOSIS — G629 Polyneuropathy, unspecified: Secondary | ICD-10-CM | POA: Diagnosis not present

## 2023-12-24 DIAGNOSIS — E785 Hyperlipidemia, unspecified: Secondary | ICD-10-CM | POA: Diagnosis not present

## 2023-12-24 DIAGNOSIS — E871 Hypo-osmolality and hyponatremia: Secondary | ICD-10-CM | POA: Diagnosis not present

## 2023-12-24 DIAGNOSIS — G4700 Insomnia, unspecified: Secondary | ICD-10-CM | POA: Diagnosis not present

## 2023-12-24 DIAGNOSIS — L853 Xerosis cutis: Secondary | ICD-10-CM | POA: Diagnosis not present

## 2023-12-24 DIAGNOSIS — R600 Localized edema: Secondary | ICD-10-CM | POA: Diagnosis not present

## 2023-12-26 DIAGNOSIS — E871 Hypo-osmolality and hyponatremia: Secondary | ICD-10-CM | POA: Diagnosis not present

## 2023-12-26 DIAGNOSIS — D649 Anemia, unspecified: Secondary | ICD-10-CM | POA: Diagnosis not present

## 2024-01-01 DIAGNOSIS — E871 Hypo-osmolality and hyponatremia: Secondary | ICD-10-CM | POA: Diagnosis not present

## 2024-01-02 DIAGNOSIS — I10 Essential (primary) hypertension: Secondary | ICD-10-CM | POA: Diagnosis not present

## 2024-01-04 DIAGNOSIS — D649 Anemia, unspecified: Secondary | ICD-10-CM | POA: Diagnosis not present

## 2024-01-05 DIAGNOSIS — E871 Hypo-osmolality and hyponatremia: Secondary | ICD-10-CM | POA: Diagnosis not present

## 2024-01-07 DIAGNOSIS — E871 Hypo-osmolality and hyponatremia: Secondary | ICD-10-CM | POA: Diagnosis not present

## 2024-01-07 DIAGNOSIS — I4891 Unspecified atrial fibrillation: Secondary | ICD-10-CM | POA: Diagnosis not present

## 2024-01-07 DIAGNOSIS — I10 Essential (primary) hypertension: Secondary | ICD-10-CM | POA: Diagnosis not present

## 2024-01-07 DIAGNOSIS — G4700 Insomnia, unspecified: Secondary | ICD-10-CM | POA: Diagnosis not present

## 2024-01-07 DIAGNOSIS — L853 Xerosis cutis: Secondary | ICD-10-CM | POA: Diagnosis not present

## 2024-01-07 DIAGNOSIS — K5900 Constipation, unspecified: Secondary | ICD-10-CM | POA: Diagnosis not present

## 2024-01-07 DIAGNOSIS — R600 Localized edema: Secondary | ICD-10-CM | POA: Diagnosis not present

## 2024-01-07 DIAGNOSIS — I998 Other disorder of circulatory system: Secondary | ICD-10-CM | POA: Diagnosis not present

## 2024-01-07 DIAGNOSIS — J449 Chronic obstructive pulmonary disease, unspecified: Secondary | ICD-10-CM | POA: Diagnosis not present

## 2024-01-07 DIAGNOSIS — K2100 Gastro-esophageal reflux disease with esophagitis, without bleeding: Secondary | ICD-10-CM | POA: Diagnosis not present

## 2024-01-07 DIAGNOSIS — G4733 Obstructive sleep apnea (adult) (pediatric): Secondary | ICD-10-CM | POA: Diagnosis not present

## 2024-01-07 DIAGNOSIS — E222 Syndrome of inappropriate secretion of antidiuretic hormone: Secondary | ICD-10-CM | POA: Diagnosis not present

## 2024-01-08 DIAGNOSIS — I10 Essential (primary) hypertension: Secondary | ICD-10-CM | POA: Diagnosis not present

## 2024-01-09 DIAGNOSIS — I4891 Unspecified atrial fibrillation: Secondary | ICD-10-CM | POA: Diagnosis not present

## 2024-01-09 DIAGNOSIS — I10 Essential (primary) hypertension: Secondary | ICD-10-CM | POA: Diagnosis not present

## 2024-01-09 DIAGNOSIS — J449 Chronic obstructive pulmonary disease, unspecified: Secondary | ICD-10-CM | POA: Diagnosis not present

## 2024-01-09 DIAGNOSIS — E785 Hyperlipidemia, unspecified: Secondary | ICD-10-CM | POA: Diagnosis not present

## 2024-01-09 DIAGNOSIS — I70223 Atherosclerosis of native arteries of extremities with rest pain, bilateral legs: Secondary | ICD-10-CM | POA: Diagnosis not present

## 2024-01-09 DIAGNOSIS — N189 Chronic kidney disease, unspecified: Secondary | ICD-10-CM | POA: Diagnosis not present

## 2024-01-10 DIAGNOSIS — G4700 Insomnia, unspecified: Secondary | ICD-10-CM | POA: Diagnosis not present

## 2024-01-10 DIAGNOSIS — I10 Essential (primary) hypertension: Secondary | ICD-10-CM | POA: Diagnosis not present

## 2024-01-10 DIAGNOSIS — I4891 Unspecified atrial fibrillation: Secondary | ICD-10-CM | POA: Diagnosis not present

## 2024-01-10 DIAGNOSIS — U071 COVID-19: Secondary | ICD-10-CM | POA: Diagnosis not present

## 2024-01-10 DIAGNOSIS — E222 Syndrome of inappropriate secretion of antidiuretic hormone: Secondary | ICD-10-CM | POA: Diagnosis not present

## 2024-01-10 DIAGNOSIS — K2100 Gastro-esophageal reflux disease with esophagitis, without bleeding: Secondary | ICD-10-CM | POA: Diagnosis not present

## 2024-01-10 DIAGNOSIS — R600 Localized edema: Secondary | ICD-10-CM | POA: Diagnosis not present

## 2024-01-10 DIAGNOSIS — G4733 Obstructive sleep apnea (adult) (pediatric): Secondary | ICD-10-CM | POA: Diagnosis not present

## 2024-01-10 DIAGNOSIS — K5900 Constipation, unspecified: Secondary | ICD-10-CM | POA: Diagnosis not present

## 2024-01-10 DIAGNOSIS — L853 Xerosis cutis: Secondary | ICD-10-CM | POA: Diagnosis not present

## 2024-01-10 DIAGNOSIS — E871 Hypo-osmolality and hyponatremia: Secondary | ICD-10-CM | POA: Diagnosis not present

## 2024-01-10 DIAGNOSIS — J449 Chronic obstructive pulmonary disease, unspecified: Secondary | ICD-10-CM | POA: Diagnosis not present

## 2024-01-16 DIAGNOSIS — E871 Hypo-osmolality and hyponatremia: Secondary | ICD-10-CM | POA: Diagnosis not present

## 2024-01-16 DIAGNOSIS — G4700 Insomnia, unspecified: Secondary | ICD-10-CM | POA: Diagnosis not present

## 2024-01-16 DIAGNOSIS — G4733 Obstructive sleep apnea (adult) (pediatric): Secondary | ICD-10-CM | POA: Diagnosis not present

## 2024-01-16 DIAGNOSIS — K5900 Constipation, unspecified: Secondary | ICD-10-CM | POA: Diagnosis not present

## 2024-01-16 DIAGNOSIS — U071 COVID-19: Secondary | ICD-10-CM | POA: Diagnosis not present

## 2024-01-16 DIAGNOSIS — J449 Chronic obstructive pulmonary disease, unspecified: Secondary | ICD-10-CM | POA: Diagnosis not present

## 2024-01-16 DIAGNOSIS — E785 Hyperlipidemia, unspecified: Secondary | ICD-10-CM | POA: Diagnosis not present

## 2024-01-16 DIAGNOSIS — I10 Essential (primary) hypertension: Secondary | ICD-10-CM | POA: Diagnosis not present

## 2024-01-16 DIAGNOSIS — K2100 Gastro-esophageal reflux disease with esophagitis, without bleeding: Secondary | ICD-10-CM | POA: Diagnosis not present

## 2024-01-16 DIAGNOSIS — L853 Xerosis cutis: Secondary | ICD-10-CM | POA: Diagnosis not present

## 2024-01-16 DIAGNOSIS — I4891 Unspecified atrial fibrillation: Secondary | ICD-10-CM | POA: Diagnosis not present

## 2024-01-16 DIAGNOSIS — N189 Chronic kidney disease, unspecified: Secondary | ICD-10-CM | POA: Diagnosis not present

## 2024-01-26 DIAGNOSIS — N189 Chronic kidney disease, unspecified: Secondary | ICD-10-CM | POA: Diagnosis not present

## 2024-01-26 DIAGNOSIS — D509 Iron deficiency anemia, unspecified: Secondary | ICD-10-CM | POA: Diagnosis not present

## 2024-01-26 DIAGNOSIS — E559 Vitamin D deficiency, unspecified: Secondary | ICD-10-CM | POA: Diagnosis not present

## 2024-01-26 DIAGNOSIS — E119 Type 2 diabetes mellitus without complications: Secondary | ICD-10-CM | POA: Diagnosis not present

## 2024-01-26 DIAGNOSIS — I2510 Atherosclerotic heart disease of native coronary artery without angina pectoris: Secondary | ICD-10-CM | POA: Diagnosis not present

## 2024-02-01 DIAGNOSIS — I10 Essential (primary) hypertension: Secondary | ICD-10-CM | POA: Diagnosis not present

## 2024-02-04 DIAGNOSIS — E871 Hypo-osmolality and hyponatremia: Secondary | ICD-10-CM | POA: Diagnosis not present

## 2024-02-05 DIAGNOSIS — I70223 Atherosclerosis of native arteries of extremities with rest pain, bilateral legs: Secondary | ICD-10-CM | POA: Diagnosis not present

## 2024-02-05 DIAGNOSIS — N189 Chronic kidney disease, unspecified: Secondary | ICD-10-CM | POA: Diagnosis not present

## 2024-02-05 DIAGNOSIS — I10 Essential (primary) hypertension: Secondary | ICD-10-CM | POA: Diagnosis not present

## 2024-02-05 DIAGNOSIS — J449 Chronic obstructive pulmonary disease, unspecified: Secondary | ICD-10-CM | POA: Diagnosis not present

## 2024-02-05 DIAGNOSIS — I4891 Unspecified atrial fibrillation: Secondary | ICD-10-CM | POA: Diagnosis not present

## 2024-02-05 DIAGNOSIS — E785 Hyperlipidemia, unspecified: Secondary | ICD-10-CM | POA: Diagnosis not present

## 2024-02-07 DIAGNOSIS — K5900 Constipation, unspecified: Secondary | ICD-10-CM | POA: Diagnosis not present

## 2024-02-07 DIAGNOSIS — I4891 Unspecified atrial fibrillation: Secondary | ICD-10-CM | POA: Diagnosis not present

## 2024-02-07 DIAGNOSIS — R600 Localized edema: Secondary | ICD-10-CM | POA: Diagnosis not present

## 2024-02-07 DIAGNOSIS — E871 Hypo-osmolality and hyponatremia: Secondary | ICD-10-CM | POA: Diagnosis not present

## 2024-02-07 DIAGNOSIS — L853 Xerosis cutis: Secondary | ICD-10-CM | POA: Diagnosis not present

## 2024-02-07 DIAGNOSIS — U071 COVID-19: Secondary | ICD-10-CM | POA: Diagnosis not present

## 2024-02-07 DIAGNOSIS — G4700 Insomnia, unspecified: Secondary | ICD-10-CM | POA: Diagnosis not present

## 2024-02-07 DIAGNOSIS — J449 Chronic obstructive pulmonary disease, unspecified: Secondary | ICD-10-CM | POA: Diagnosis not present

## 2024-02-07 DIAGNOSIS — I998 Other disorder of circulatory system: Secondary | ICD-10-CM | POA: Diagnosis not present

## 2024-02-07 DIAGNOSIS — G4733 Obstructive sleep apnea (adult) (pediatric): Secondary | ICD-10-CM | POA: Diagnosis not present

## 2024-02-07 DIAGNOSIS — E222 Syndrome of inappropriate secretion of antidiuretic hormone: Secondary | ICD-10-CM | POA: Diagnosis not present

## 2024-02-07 DIAGNOSIS — K2100 Gastro-esophageal reflux disease with esophagitis, without bleeding: Secondary | ICD-10-CM | POA: Diagnosis not present

## 2024-02-12 DIAGNOSIS — I7091 Generalized atherosclerosis: Secondary | ICD-10-CM | POA: Diagnosis not present

## 2024-02-13 DIAGNOSIS — I4891 Unspecified atrial fibrillation: Secondary | ICD-10-CM | POA: Diagnosis not present

## 2024-02-13 DIAGNOSIS — E871 Hypo-osmolality and hyponatremia: Secondary | ICD-10-CM | POA: Diagnosis not present

## 2024-02-13 DIAGNOSIS — J449 Chronic obstructive pulmonary disease, unspecified: Secondary | ICD-10-CM | POA: Diagnosis not present

## 2024-02-13 DIAGNOSIS — K5900 Constipation, unspecified: Secondary | ICD-10-CM | POA: Diagnosis not present

## 2024-02-13 DIAGNOSIS — E222 Syndrome of inappropriate secretion of antidiuretic hormone: Secondary | ICD-10-CM | POA: Diagnosis not present

## 2024-02-13 DIAGNOSIS — G4700 Insomnia, unspecified: Secondary | ICD-10-CM | POA: Diagnosis not present

## 2024-02-13 DIAGNOSIS — R600 Localized edema: Secondary | ICD-10-CM | POA: Diagnosis not present

## 2024-02-13 DIAGNOSIS — I998 Other disorder of circulatory system: Secondary | ICD-10-CM | POA: Diagnosis not present

## 2024-02-13 DIAGNOSIS — G4733 Obstructive sleep apnea (adult) (pediatric): Secondary | ICD-10-CM | POA: Diagnosis not present

## 2024-02-13 DIAGNOSIS — K2100 Gastro-esophageal reflux disease with esophagitis, without bleeding: Secondary | ICD-10-CM | POA: Diagnosis not present

## 2024-02-13 DIAGNOSIS — L853 Xerosis cutis: Secondary | ICD-10-CM | POA: Diagnosis not present

## 2024-02-14 DIAGNOSIS — N390 Urinary tract infection, site not specified: Secondary | ICD-10-CM | POA: Diagnosis not present

## 2024-02-15 DIAGNOSIS — N390 Urinary tract infection, site not specified: Secondary | ICD-10-CM | POA: Diagnosis not present

## 2024-02-18 DIAGNOSIS — J449 Chronic obstructive pulmonary disease, unspecified: Secondary | ICD-10-CM | POA: Diagnosis not present

## 2024-02-18 DIAGNOSIS — I4891 Unspecified atrial fibrillation: Secondary | ICD-10-CM | POA: Diagnosis not present

## 2024-02-18 DIAGNOSIS — L853 Xerosis cutis: Secondary | ICD-10-CM | POA: Diagnosis not present

## 2024-02-18 DIAGNOSIS — D649 Anemia, unspecified: Secondary | ICD-10-CM | POA: Diagnosis not present

## 2024-02-18 DIAGNOSIS — E871 Hypo-osmolality and hyponatremia: Secondary | ICD-10-CM | POA: Diagnosis not present

## 2024-02-18 DIAGNOSIS — G4700 Insomnia, unspecified: Secondary | ICD-10-CM | POA: Diagnosis not present

## 2024-02-18 DIAGNOSIS — K2100 Gastro-esophageal reflux disease with esophagitis, without bleeding: Secondary | ICD-10-CM | POA: Diagnosis not present

## 2024-02-18 DIAGNOSIS — G629 Polyneuropathy, unspecified: Secondary | ICD-10-CM | POA: Diagnosis not present

## 2024-02-18 DIAGNOSIS — I10 Essential (primary) hypertension: Secondary | ICD-10-CM | POA: Diagnosis not present

## 2024-02-18 DIAGNOSIS — K5900 Constipation, unspecified: Secondary | ICD-10-CM | POA: Diagnosis not present

## 2024-02-18 DIAGNOSIS — G4733 Obstructive sleep apnea (adult) (pediatric): Secondary | ICD-10-CM | POA: Diagnosis not present

## 2024-02-18 DIAGNOSIS — E785 Hyperlipidemia, unspecified: Secondary | ICD-10-CM | POA: Diagnosis not present

## 2024-02-20 DIAGNOSIS — D649 Anemia, unspecified: Secondary | ICD-10-CM | POA: Diagnosis not present

## 2024-02-20 DIAGNOSIS — E871 Hypo-osmolality and hyponatremia: Secondary | ICD-10-CM | POA: Diagnosis not present

## 2024-02-26 DIAGNOSIS — I10 Essential (primary) hypertension: Secondary | ICD-10-CM | POA: Diagnosis not present

## 2024-02-27 DIAGNOSIS — E785 Hyperlipidemia, unspecified: Secondary | ICD-10-CM | POA: Diagnosis not present

## 2024-02-27 DIAGNOSIS — K5900 Constipation, unspecified: Secondary | ICD-10-CM | POA: Diagnosis not present

## 2024-02-27 DIAGNOSIS — G4700 Insomnia, unspecified: Secondary | ICD-10-CM | POA: Diagnosis not present

## 2024-02-27 DIAGNOSIS — G629 Polyneuropathy, unspecified: Secondary | ICD-10-CM | POA: Diagnosis not present

## 2024-02-27 DIAGNOSIS — R600 Localized edema: Secondary | ICD-10-CM | POA: Diagnosis not present

## 2024-02-27 DIAGNOSIS — D649 Anemia, unspecified: Secondary | ICD-10-CM | POA: Diagnosis not present

## 2024-02-27 DIAGNOSIS — I4891 Unspecified atrial fibrillation: Secondary | ICD-10-CM | POA: Diagnosis not present

## 2024-02-27 DIAGNOSIS — E871 Hypo-osmolality and hyponatremia: Secondary | ICD-10-CM | POA: Diagnosis not present

## 2024-02-27 DIAGNOSIS — L853 Xerosis cutis: Secondary | ICD-10-CM | POA: Diagnosis not present

## 2024-02-27 DIAGNOSIS — J449 Chronic obstructive pulmonary disease, unspecified: Secondary | ICD-10-CM | POA: Diagnosis not present

## 2024-02-27 DIAGNOSIS — I998 Other disorder of circulatory system: Secondary | ICD-10-CM | POA: Diagnosis not present

## 2024-02-29 DIAGNOSIS — I10 Essential (primary) hypertension: Secondary | ICD-10-CM | POA: Diagnosis not present

## 2024-03-03 DIAGNOSIS — E871 Hypo-osmolality and hyponatremia: Secondary | ICD-10-CM | POA: Diagnosis not present

## 2024-03-05 DIAGNOSIS — R296 Repeated falls: Secondary | ICD-10-CM | POA: Diagnosis not present

## 2024-03-06 DIAGNOSIS — K5792 Diverticulitis of intestine, part unspecified, without perforation or abscess without bleeding: Secondary | ICD-10-CM | POA: Diagnosis not present

## 2024-03-06 DIAGNOSIS — R339 Retention of urine, unspecified: Secondary | ICD-10-CM | POA: Diagnosis not present

## 2024-03-06 DIAGNOSIS — E871 Hypo-osmolality and hyponatremia: Secondary | ICD-10-CM | POA: Diagnosis not present

## 2024-03-06 DIAGNOSIS — K5900 Constipation, unspecified: Secondary | ICD-10-CM | POA: Diagnosis not present

## 2024-03-06 DIAGNOSIS — R296 Repeated falls: Secondary | ICD-10-CM | POA: Diagnosis not present

## 2024-03-06 DIAGNOSIS — R140 Abdominal distension (gaseous): Secondary | ICD-10-CM | POA: Diagnosis not present

## 2024-03-07 DIAGNOSIS — I70223 Atherosclerosis of native arteries of extremities with rest pain, bilateral legs: Secondary | ICD-10-CM | POA: Diagnosis not present

## 2024-03-07 DIAGNOSIS — D72829 Elevated white blood cell count, unspecified: Secondary | ICD-10-CM | POA: Diagnosis not present

## 2024-03-07 DIAGNOSIS — N189 Chronic kidney disease, unspecified: Secondary | ICD-10-CM | POA: Diagnosis not present

## 2024-03-07 DIAGNOSIS — J449 Chronic obstructive pulmonary disease, unspecified: Secondary | ICD-10-CM | POA: Diagnosis not present

## 2024-03-07 DIAGNOSIS — N390 Urinary tract infection, site not specified: Secondary | ICD-10-CM | POA: Diagnosis not present

## 2024-03-07 DIAGNOSIS — R531 Weakness: Secondary | ICD-10-CM | POA: Diagnosis not present

## 2024-03-07 DIAGNOSIS — R051 Acute cough: Secondary | ICD-10-CM | POA: Diagnosis not present

## 2024-03-07 DIAGNOSIS — I4891 Unspecified atrial fibrillation: Secondary | ICD-10-CM | POA: Diagnosis not present

## 2024-03-07 DIAGNOSIS — I10 Essential (primary) hypertension: Secondary | ICD-10-CM | POA: Diagnosis not present

## 2024-03-07 DIAGNOSIS — E785 Hyperlipidemia, unspecified: Secondary | ICD-10-CM | POA: Diagnosis not present

## 2024-03-10 DIAGNOSIS — I10 Essential (primary) hypertension: Secondary | ICD-10-CM | POA: Diagnosis not present

## 2024-03-10 DIAGNOSIS — K2100 Gastro-esophageal reflux disease with esophagitis, without bleeding: Secondary | ICD-10-CM | POA: Diagnosis not present

## 2024-03-10 DIAGNOSIS — E222 Syndrome of inappropriate secretion of antidiuretic hormone: Secondary | ICD-10-CM | POA: Diagnosis not present

## 2024-03-10 DIAGNOSIS — N189 Chronic kidney disease, unspecified: Secondary | ICD-10-CM | POA: Diagnosis not present

## 2024-03-10 DIAGNOSIS — E785 Hyperlipidemia, unspecified: Secondary | ICD-10-CM | POA: Diagnosis not present

## 2024-03-10 DIAGNOSIS — R600 Localized edema: Secondary | ICD-10-CM | POA: Diagnosis not present

## 2024-03-10 DIAGNOSIS — I4891 Unspecified atrial fibrillation: Secondary | ICD-10-CM | POA: Diagnosis not present

## 2024-03-10 DIAGNOSIS — J449 Chronic obstructive pulmonary disease, unspecified: Secondary | ICD-10-CM | POA: Diagnosis not present

## 2024-03-10 DIAGNOSIS — L853 Xerosis cutis: Secondary | ICD-10-CM | POA: Diagnosis not present

## 2024-03-10 DIAGNOSIS — R5381 Other malaise: Secondary | ICD-10-CM | POA: Diagnosis not present

## 2024-03-11 DIAGNOSIS — I10 Essential (primary) hypertension: Secondary | ICD-10-CM | POA: Diagnosis not present

## 2024-03-11 DIAGNOSIS — D649 Anemia, unspecified: Secondary | ICD-10-CM | POA: Diagnosis not present

## 2024-03-15 DIAGNOSIS — E871 Hypo-osmolality and hyponatremia: Secondary | ICD-10-CM | POA: Diagnosis not present

## 2024-03-27 DIAGNOSIS — N189 Chronic kidney disease, unspecified: Secondary | ICD-10-CM | POA: Diagnosis not present

## 2024-04-03 DIAGNOSIS — N189 Chronic kidney disease, unspecified: Secondary | ICD-10-CM | POA: Diagnosis not present

## 2024-04-09 DIAGNOSIS — K2100 Gastro-esophageal reflux disease with esophagitis, without bleeding: Secondary | ICD-10-CM | POA: Diagnosis not present

## 2024-04-09 DIAGNOSIS — I10 Essential (primary) hypertension: Secondary | ICD-10-CM | POA: Diagnosis not present

## 2024-04-09 DIAGNOSIS — J449 Chronic obstructive pulmonary disease, unspecified: Secondary | ICD-10-CM | POA: Diagnosis not present

## 2024-04-09 DIAGNOSIS — E785 Hyperlipidemia, unspecified: Secondary | ICD-10-CM | POA: Diagnosis not present

## 2024-04-09 DIAGNOSIS — I998 Other disorder of circulatory system: Secondary | ICD-10-CM | POA: Diagnosis not present

## 2024-04-09 DIAGNOSIS — L853 Xerosis cutis: Secondary | ICD-10-CM | POA: Diagnosis not present

## 2024-04-09 DIAGNOSIS — I4891 Unspecified atrial fibrillation: Secondary | ICD-10-CM | POA: Diagnosis not present

## 2024-04-09 DIAGNOSIS — E871 Hypo-osmolality and hyponatremia: Secondary | ICD-10-CM | POA: Diagnosis not present

## 2024-04-09 DIAGNOSIS — N189 Chronic kidney disease, unspecified: Secondary | ICD-10-CM | POA: Diagnosis not present

## 2024-04-09 DIAGNOSIS — R600 Localized edema: Secondary | ICD-10-CM | POA: Diagnosis not present

## 2024-04-09 DIAGNOSIS — R5381 Other malaise: Secondary | ICD-10-CM | POA: Diagnosis not present

## 2024-04-14 DIAGNOSIS — I10 Essential (primary) hypertension: Secondary | ICD-10-CM | POA: Diagnosis not present

## 2024-04-14 DIAGNOSIS — L89322 Pressure ulcer of left buttock, stage 2: Secondary | ICD-10-CM | POA: Diagnosis not present

## 2024-04-14 DIAGNOSIS — K2100 Gastro-esophageal reflux disease with esophagitis, without bleeding: Secondary | ICD-10-CM | POA: Diagnosis not present

## 2024-04-14 DIAGNOSIS — R600 Localized edema: Secondary | ICD-10-CM | POA: Diagnosis not present

## 2024-04-14 DIAGNOSIS — N189 Chronic kidney disease, unspecified: Secondary | ICD-10-CM | POA: Diagnosis not present

## 2024-04-14 DIAGNOSIS — E222 Syndrome of inappropriate secretion of antidiuretic hormone: Secondary | ICD-10-CM | POA: Diagnosis not present

## 2024-04-14 DIAGNOSIS — R5381 Other malaise: Secondary | ICD-10-CM | POA: Diagnosis not present

## 2024-04-14 DIAGNOSIS — L853 Xerosis cutis: Secondary | ICD-10-CM | POA: Diagnosis not present

## 2024-04-14 DIAGNOSIS — E871 Hypo-osmolality and hyponatremia: Secondary | ICD-10-CM | POA: Diagnosis not present

## 2024-04-14 DIAGNOSIS — I4891 Unspecified atrial fibrillation: Secondary | ICD-10-CM | POA: Diagnosis not present

## 2024-04-14 DIAGNOSIS — J449 Chronic obstructive pulmonary disease, unspecified: Secondary | ICD-10-CM | POA: Diagnosis not present

## 2024-04-15 DIAGNOSIS — L89322 Pressure ulcer of left buttock, stage 2: Secondary | ICD-10-CM | POA: Diagnosis not present

## 2024-04-21 DIAGNOSIS — E785 Hyperlipidemia, unspecified: Secondary | ICD-10-CM | POA: Diagnosis not present

## 2024-04-21 DIAGNOSIS — I70223 Atherosclerosis of native arteries of extremities with rest pain, bilateral legs: Secondary | ICD-10-CM | POA: Diagnosis not present

## 2024-04-21 DIAGNOSIS — I10 Essential (primary) hypertension: Secondary | ICD-10-CM | POA: Diagnosis not present

## 2024-04-21 DIAGNOSIS — J449 Chronic obstructive pulmonary disease, unspecified: Secondary | ICD-10-CM | POA: Diagnosis not present

## 2024-04-21 DIAGNOSIS — N189 Chronic kidney disease, unspecified: Secondary | ICD-10-CM | POA: Diagnosis not present

## 2024-04-21 DIAGNOSIS — I4891 Unspecified atrial fibrillation: Secondary | ICD-10-CM | POA: Diagnosis not present

## 2024-04-22 DIAGNOSIS — L89322 Pressure ulcer of left buttock, stage 2: Secondary | ICD-10-CM | POA: Diagnosis not present

## 2024-04-23 DIAGNOSIS — J449 Chronic obstructive pulmonary disease, unspecified: Secondary | ICD-10-CM | POA: Diagnosis not present

## 2024-04-23 DIAGNOSIS — E871 Hypo-osmolality and hyponatremia: Secondary | ICD-10-CM | POA: Diagnosis not present

## 2024-04-23 DIAGNOSIS — B372 Candidiasis of skin and nail: Secondary | ICD-10-CM | POA: Diagnosis not present

## 2024-04-23 DIAGNOSIS — L89322 Pressure ulcer of left buttock, stage 2: Secondary | ICD-10-CM | POA: Diagnosis not present

## 2024-04-30 DIAGNOSIS — I4891 Unspecified atrial fibrillation: Secondary | ICD-10-CM | POA: Diagnosis not present

## 2024-04-30 DIAGNOSIS — E222 Syndrome of inappropriate secretion of antidiuretic hormone: Secondary | ICD-10-CM | POA: Diagnosis not present

## 2024-04-30 DIAGNOSIS — E871 Hypo-osmolality and hyponatremia: Secondary | ICD-10-CM | POA: Diagnosis not present

## 2024-05-01 DIAGNOSIS — I509 Heart failure, unspecified: Secondary | ICD-10-CM | POA: Diagnosis not present

## 2024-05-05 DIAGNOSIS — E871 Hypo-osmolality and hyponatremia: Secondary | ICD-10-CM | POA: Diagnosis not present

## 2024-05-05 DIAGNOSIS — I10 Essential (primary) hypertension: Secondary | ICD-10-CM | POA: Diagnosis not present

## 2024-05-05 DIAGNOSIS — I998 Other disorder of circulatory system: Secondary | ICD-10-CM | POA: Diagnosis not present

## 2024-05-05 DIAGNOSIS — K2100 Gastro-esophageal reflux disease with esophagitis, without bleeding: Secondary | ICD-10-CM | POA: Diagnosis not present

## 2024-05-05 DIAGNOSIS — J449 Chronic obstructive pulmonary disease, unspecified: Secondary | ICD-10-CM | POA: Diagnosis not present

## 2024-05-05 DIAGNOSIS — R600 Localized edema: Secondary | ICD-10-CM | POA: Diagnosis not present

## 2024-05-05 DIAGNOSIS — E785 Hyperlipidemia, unspecified: Secondary | ICD-10-CM | POA: Diagnosis not present

## 2024-05-05 DIAGNOSIS — E222 Syndrome of inappropriate secretion of antidiuretic hormone: Secondary | ICD-10-CM | POA: Diagnosis not present

## 2024-05-05 DIAGNOSIS — G4733 Obstructive sleep apnea (adult) (pediatric): Secondary | ICD-10-CM | POA: Diagnosis not present

## 2024-05-05 DIAGNOSIS — L853 Xerosis cutis: Secondary | ICD-10-CM | POA: Diagnosis not present

## 2024-05-05 DIAGNOSIS — N189 Chronic kidney disease, unspecified: Secondary | ICD-10-CM | POA: Diagnosis not present

## 2024-05-05 DIAGNOSIS — I4891 Unspecified atrial fibrillation: Secondary | ICD-10-CM | POA: Diagnosis not present

## 2024-05-14 DIAGNOSIS — E871 Hypo-osmolality and hyponatremia: Secondary | ICD-10-CM | POA: Diagnosis not present

## 2024-05-19 DIAGNOSIS — E871 Hypo-osmolality and hyponatremia: Secondary | ICD-10-CM | POA: Diagnosis not present

## 2024-05-19 DIAGNOSIS — D509 Iron deficiency anemia, unspecified: Secondary | ICD-10-CM | POA: Diagnosis not present

## 2024-05-20 DIAGNOSIS — I7091 Generalized atherosclerosis: Secondary | ICD-10-CM | POA: Diagnosis not present

## 2024-05-28 DIAGNOSIS — N189 Chronic kidney disease, unspecified: Secondary | ICD-10-CM | POA: Diagnosis not present

## 2024-05-28 DIAGNOSIS — I70223 Atherosclerosis of native arteries of extremities with rest pain, bilateral legs: Secondary | ICD-10-CM | POA: Diagnosis not present

## 2024-05-28 DIAGNOSIS — I10 Essential (primary) hypertension: Secondary | ICD-10-CM | POA: Diagnosis not present

## 2024-05-28 DIAGNOSIS — J449 Chronic obstructive pulmonary disease, unspecified: Secondary | ICD-10-CM | POA: Diagnosis not present

## 2024-05-28 DIAGNOSIS — I4891 Unspecified atrial fibrillation: Secondary | ICD-10-CM | POA: Diagnosis not present

## 2024-05-28 DIAGNOSIS — E785 Hyperlipidemia, unspecified: Secondary | ICD-10-CM | POA: Diagnosis not present

## 2024-06-02 DIAGNOSIS — K2100 Gastro-esophageal reflux disease with esophagitis, without bleeding: Secondary | ICD-10-CM | POA: Diagnosis not present

## 2024-06-02 DIAGNOSIS — I10 Essential (primary) hypertension: Secondary | ICD-10-CM | POA: Diagnosis not present

## 2024-06-02 DIAGNOSIS — G4733 Obstructive sleep apnea (adult) (pediatric): Secondary | ICD-10-CM | POA: Diagnosis not present

## 2024-06-02 DIAGNOSIS — I4891 Unspecified atrial fibrillation: Secondary | ICD-10-CM | POA: Diagnosis not present

## 2024-06-02 DIAGNOSIS — E222 Syndrome of inappropriate secretion of antidiuretic hormone: Secondary | ICD-10-CM | POA: Diagnosis not present

## 2024-06-02 DIAGNOSIS — E785 Hyperlipidemia, unspecified: Secondary | ICD-10-CM | POA: Diagnosis not present

## 2024-06-02 DIAGNOSIS — N189 Chronic kidney disease, unspecified: Secondary | ICD-10-CM | POA: Diagnosis not present

## 2024-06-02 DIAGNOSIS — L853 Xerosis cutis: Secondary | ICD-10-CM | POA: Diagnosis not present

## 2024-06-02 DIAGNOSIS — R600 Localized edema: Secondary | ICD-10-CM | POA: Diagnosis not present

## 2024-06-02 DIAGNOSIS — J449 Chronic obstructive pulmonary disease, unspecified: Secondary | ICD-10-CM | POA: Diagnosis not present

## 2024-06-02 DIAGNOSIS — E871 Hypo-osmolality and hyponatremia: Secondary | ICD-10-CM | POA: Diagnosis not present

## 2024-06-12 DIAGNOSIS — D649 Anemia, unspecified: Secondary | ICD-10-CM | POA: Diagnosis not present

## 2024-06-12 DIAGNOSIS — L853 Xerosis cutis: Secondary | ICD-10-CM | POA: Diagnosis not present

## 2024-06-12 DIAGNOSIS — K2100 Gastro-esophageal reflux disease with esophagitis, without bleeding: Secondary | ICD-10-CM | POA: Diagnosis not present

## 2024-06-12 DIAGNOSIS — J449 Chronic obstructive pulmonary disease, unspecified: Secondary | ICD-10-CM | POA: Diagnosis not present

## 2024-06-12 DIAGNOSIS — E222 Syndrome of inappropriate secretion of antidiuretic hormone: Secondary | ICD-10-CM | POA: Diagnosis not present

## 2024-06-12 DIAGNOSIS — N189 Chronic kidney disease, unspecified: Secondary | ICD-10-CM | POA: Diagnosis not present

## 2024-06-12 DIAGNOSIS — E785 Hyperlipidemia, unspecified: Secondary | ICD-10-CM | POA: Diagnosis not present

## 2024-06-12 DIAGNOSIS — R600 Localized edema: Secondary | ICD-10-CM | POA: Diagnosis not present

## 2024-06-12 DIAGNOSIS — I10 Essential (primary) hypertension: Secondary | ICD-10-CM | POA: Diagnosis not present

## 2024-06-12 DIAGNOSIS — I4891 Unspecified atrial fibrillation: Secondary | ICD-10-CM | POA: Diagnosis not present

## 2024-06-12 DIAGNOSIS — G629 Polyneuropathy, unspecified: Secondary | ICD-10-CM | POA: Diagnosis not present

## 2024-06-16 DIAGNOSIS — D509 Iron deficiency anemia, unspecified: Secondary | ICD-10-CM | POA: Diagnosis not present

## 2024-06-16 DIAGNOSIS — E871 Hypo-osmolality and hyponatremia: Secondary | ICD-10-CM | POA: Diagnosis not present

## 2024-06-18 DIAGNOSIS — E222 Syndrome of inappropriate secretion of antidiuretic hormone: Secondary | ICD-10-CM | POA: Diagnosis not present

## 2024-06-18 DIAGNOSIS — K2100 Gastro-esophageal reflux disease with esophagitis, without bleeding: Secondary | ICD-10-CM | POA: Diagnosis not present

## 2024-06-18 DIAGNOSIS — R21 Rash and other nonspecific skin eruption: Secondary | ICD-10-CM | POA: Diagnosis not present

## 2024-06-18 DIAGNOSIS — I10 Essential (primary) hypertension: Secondary | ICD-10-CM | POA: Diagnosis not present

## 2024-06-18 DIAGNOSIS — R600 Localized edema: Secondary | ICD-10-CM | POA: Diagnosis not present

## 2024-06-18 DIAGNOSIS — L853 Xerosis cutis: Secondary | ICD-10-CM | POA: Diagnosis not present

## 2024-06-18 DIAGNOSIS — J449 Chronic obstructive pulmonary disease, unspecified: Secondary | ICD-10-CM | POA: Diagnosis not present

## 2024-06-18 DIAGNOSIS — E785 Hyperlipidemia, unspecified: Secondary | ICD-10-CM | POA: Diagnosis not present

## 2024-06-18 DIAGNOSIS — G4733 Obstructive sleep apnea (adult) (pediatric): Secondary | ICD-10-CM | POA: Diagnosis not present

## 2024-06-18 DIAGNOSIS — I4891 Unspecified atrial fibrillation: Secondary | ICD-10-CM | POA: Diagnosis not present

## 2024-06-18 DIAGNOSIS — E871 Hypo-osmolality and hyponatremia: Secondary | ICD-10-CM | POA: Diagnosis not present

## 2024-06-18 DIAGNOSIS — N189 Chronic kidney disease, unspecified: Secondary | ICD-10-CM | POA: Diagnosis not present

## 2024-06-26 DIAGNOSIS — E785 Hyperlipidemia, unspecified: Secondary | ICD-10-CM | POA: Diagnosis not present

## 2024-06-26 DIAGNOSIS — J449 Chronic obstructive pulmonary disease, unspecified: Secondary | ICD-10-CM | POA: Diagnosis not present

## 2024-06-26 DIAGNOSIS — I10 Essential (primary) hypertension: Secondary | ICD-10-CM | POA: Diagnosis not present

## 2024-06-26 DIAGNOSIS — N189 Chronic kidney disease, unspecified: Secondary | ICD-10-CM | POA: Diagnosis not present

## 2024-06-26 DIAGNOSIS — I4891 Unspecified atrial fibrillation: Secondary | ICD-10-CM | POA: Diagnosis not present

## 2024-06-27 DIAGNOSIS — S41111A Laceration without foreign body of right upper arm, initial encounter: Secondary | ICD-10-CM | POA: Diagnosis not present

## 2024-06-29 DIAGNOSIS — M25511 Pain in right shoulder: Secondary | ICD-10-CM | POA: Diagnosis not present

## 2024-06-29 DIAGNOSIS — M25521 Pain in right elbow: Secondary | ICD-10-CM | POA: Diagnosis not present

## 2024-06-29 DIAGNOSIS — M545 Low back pain, unspecified: Secondary | ICD-10-CM | POA: Diagnosis not present

## 2024-06-29 DIAGNOSIS — M79631 Pain in right forearm: Secondary | ICD-10-CM | POA: Diagnosis not present

## 2024-06-29 DIAGNOSIS — M25531 Pain in right wrist: Secondary | ICD-10-CM | POA: Diagnosis not present

## 2024-06-29 DIAGNOSIS — M79601 Pain in right arm: Secondary | ICD-10-CM | POA: Diagnosis not present

## 2024-06-29 DIAGNOSIS — M79641 Pain in right hand: Secondary | ICD-10-CM | POA: Diagnosis not present

## 2024-06-30 DIAGNOSIS — D509 Iron deficiency anemia, unspecified: Secondary | ICD-10-CM | POA: Diagnosis not present

## 2024-06-30 DIAGNOSIS — E871 Hypo-osmolality and hyponatremia: Secondary | ICD-10-CM | POA: Diagnosis not present

## 2024-06-30 DIAGNOSIS — M545 Low back pain, unspecified: Secondary | ICD-10-CM | POA: Diagnosis not present

## 2024-07-02 DIAGNOSIS — N189 Chronic kidney disease, unspecified: Secondary | ICD-10-CM | POA: Diagnosis not present

## 2024-07-02 DIAGNOSIS — E871 Hypo-osmolality and hyponatremia: Secondary | ICD-10-CM | POA: Diagnosis not present

## 2024-07-02 DIAGNOSIS — I10 Essential (primary) hypertension: Secondary | ICD-10-CM | POA: Diagnosis not present

## 2024-07-02 DIAGNOSIS — G629 Polyneuropathy, unspecified: Secondary | ICD-10-CM | POA: Diagnosis not present

## 2024-07-02 DIAGNOSIS — R600 Localized edema: Secondary | ICD-10-CM | POA: Diagnosis not present

## 2024-07-02 DIAGNOSIS — G4733 Obstructive sleep apnea (adult) (pediatric): Secondary | ICD-10-CM | POA: Diagnosis not present

## 2024-07-02 DIAGNOSIS — L853 Xerosis cutis: Secondary | ICD-10-CM | POA: Diagnosis not present

## 2024-07-02 DIAGNOSIS — E785 Hyperlipidemia, unspecified: Secondary | ICD-10-CM | POA: Diagnosis not present

## 2024-07-02 DIAGNOSIS — K2100 Gastro-esophageal reflux disease with esophagitis, without bleeding: Secondary | ICD-10-CM | POA: Diagnosis not present

## 2024-07-02 DIAGNOSIS — J449 Chronic obstructive pulmonary disease, unspecified: Secondary | ICD-10-CM | POA: Diagnosis not present

## 2024-07-02 DIAGNOSIS — D649 Anemia, unspecified: Secondary | ICD-10-CM | POA: Diagnosis not present

## 2024-07-14 DIAGNOSIS — E871 Hypo-osmolality and hyponatremia: Secondary | ICD-10-CM | POA: Diagnosis not present

## 2024-07-14 DIAGNOSIS — Z79899 Other long term (current) drug therapy: Secondary | ICD-10-CM | POA: Diagnosis not present

## 2024-07-14 DIAGNOSIS — D509 Iron deficiency anemia, unspecified: Secondary | ICD-10-CM | POA: Diagnosis not present

## 2024-07-22 DIAGNOSIS — E785 Hyperlipidemia, unspecified: Secondary | ICD-10-CM | POA: Diagnosis not present

## 2024-07-28 DIAGNOSIS — I4891 Unspecified atrial fibrillation: Secondary | ICD-10-CM | POA: Diagnosis not present

## 2024-07-28 DIAGNOSIS — E222 Syndrome of inappropriate secretion of antidiuretic hormone: Secondary | ICD-10-CM | POA: Diagnosis not present

## 2024-07-28 DIAGNOSIS — I10 Essential (primary) hypertension: Secondary | ICD-10-CM | POA: Diagnosis not present

## 2024-07-29 DIAGNOSIS — I10 Essential (primary) hypertension: Secondary | ICD-10-CM | POA: Diagnosis not present

## 2024-07-30 DIAGNOSIS — N189 Chronic kidney disease, unspecified: Secondary | ICD-10-CM | POA: Diagnosis not present

## 2024-07-30 DIAGNOSIS — I4891 Unspecified atrial fibrillation: Secondary | ICD-10-CM | POA: Diagnosis not present

## 2024-07-30 DIAGNOSIS — E785 Hyperlipidemia, unspecified: Secondary | ICD-10-CM | POA: Diagnosis not present

## 2024-07-30 DIAGNOSIS — J449 Chronic obstructive pulmonary disease, unspecified: Secondary | ICD-10-CM | POA: Diagnosis not present

## 2024-07-30 DIAGNOSIS — I10 Essential (primary) hypertension: Secondary | ICD-10-CM | POA: Diagnosis not present

## 2024-08-04 DIAGNOSIS — N189 Chronic kidney disease, unspecified: Secondary | ICD-10-CM | POA: Diagnosis not present

## 2024-08-04 DIAGNOSIS — E222 Syndrome of inappropriate secretion of antidiuretic hormone: Secondary | ICD-10-CM | POA: Diagnosis not present

## 2024-08-07 DIAGNOSIS — I4891 Unspecified atrial fibrillation: Secondary | ICD-10-CM | POA: Diagnosis not present

## 2024-08-07 DIAGNOSIS — E222 Syndrome of inappropriate secretion of antidiuretic hormone: Secondary | ICD-10-CM | POA: Diagnosis not present

## 2024-08-07 DIAGNOSIS — I10 Essential (primary) hypertension: Secondary | ICD-10-CM | POA: Diagnosis not present

## 2024-08-07 DIAGNOSIS — E871 Hypo-osmolality and hyponatremia: Secondary | ICD-10-CM | POA: Diagnosis not present

## 2024-08-08 DIAGNOSIS — I509 Heart failure, unspecified: Secondary | ICD-10-CM | POA: Diagnosis not present

## 2024-08-12 DIAGNOSIS — N189 Chronic kidney disease, unspecified: Secondary | ICD-10-CM | POA: Diagnosis not present

## 2024-08-12 DIAGNOSIS — I10 Essential (primary) hypertension: Secondary | ICD-10-CM | POA: Diagnosis not present

## 2024-08-15 ENCOUNTER — Encounter (HOSPITAL_COMMUNITY): Payer: Self-pay

## 2024-08-15 ENCOUNTER — Emergency Department (HOSPITAL_COMMUNITY)

## 2024-08-15 ENCOUNTER — Emergency Department (HOSPITAL_COMMUNITY)
Admission: EM | Admit: 2024-08-15 | Discharge: 2024-08-15 | Disposition: A | Discharge status: Skilled Nursing Facility | Attending: Emergency Medicine | Admitting: Emergency Medicine

## 2024-08-15 ENCOUNTER — Other Ambulatory Visit: Payer: Self-pay

## 2024-08-15 DIAGNOSIS — E871 Hypo-osmolality and hyponatremia: Secondary | ICD-10-CM | POA: Diagnosis not present

## 2024-08-15 DIAGNOSIS — Z7982 Long term (current) use of aspirin: Secondary | ICD-10-CM | POA: Diagnosis not present

## 2024-08-15 DIAGNOSIS — Z7901 Long term (current) use of anticoagulants: Secondary | ICD-10-CM | POA: Diagnosis not present

## 2024-08-15 DIAGNOSIS — Z79899 Other long term (current) drug therapy: Secondary | ICD-10-CM | POA: Diagnosis not present

## 2024-08-15 DIAGNOSIS — Z86718 Personal history of other venous thrombosis and embolism: Secondary | ICD-10-CM | POA: Insufficient documentation

## 2024-08-15 DIAGNOSIS — Y92002 Bathroom of unspecified non-institutional (private) residence single-family (private) house as the place of occurrence of the external cause: Secondary | ICD-10-CM | POA: Diagnosis not present

## 2024-08-15 DIAGNOSIS — S0093XA Contusion of unspecified part of head, initial encounter: Secondary | ICD-10-CM | POA: Diagnosis not present

## 2024-08-15 DIAGNOSIS — J449 Chronic obstructive pulmonary disease, unspecified: Secondary | ICD-10-CM | POA: Insufficient documentation

## 2024-08-15 DIAGNOSIS — W19XXXA Unspecified fall, initial encounter: Secondary | ICD-10-CM | POA: Diagnosis not present

## 2024-08-15 DIAGNOSIS — I10 Essential (primary) hypertension: Secondary | ICD-10-CM | POA: Insufficient documentation

## 2024-08-15 DIAGNOSIS — R410 Disorientation, unspecified: Secondary | ICD-10-CM | POA: Diagnosis not present

## 2024-08-15 DIAGNOSIS — R296 Repeated falls: Secondary | ICD-10-CM | POA: Diagnosis not present

## 2024-08-15 DIAGNOSIS — Z743 Need for continuous supervision: Secondary | ICD-10-CM | POA: Diagnosis not present

## 2024-08-15 DIAGNOSIS — S0191XA Laceration without foreign body of unspecified part of head, initial encounter: Secondary | ICD-10-CM | POA: Diagnosis not present

## 2024-08-15 DIAGNOSIS — R531 Weakness: Secondary | ICD-10-CM | POA: Diagnosis not present

## 2024-08-15 DIAGNOSIS — S51019A Laceration without foreign body of unspecified elbow, initial encounter: Secondary | ICD-10-CM | POA: Diagnosis not present

## 2024-08-15 DIAGNOSIS — T148XXA Other injury of unspecified body region, initial encounter: Secondary | ICD-10-CM | POA: Diagnosis not present

## 2024-08-15 LAB — COMPREHENSIVE METABOLIC PANEL WITH GFR
ALT: 14 U/L (ref 0–44)
AST: 22 U/L (ref 15–41)
Albumin: 3.2 g/dL — ABNORMAL LOW (ref 3.5–5.0)
Alkaline Phosphatase: 49 U/L (ref 38–126)
Anion gap: 11 (ref 5–15)
BUN: 26 mg/dL — ABNORMAL HIGH (ref 8–23)
CO2: 19 mmol/L — ABNORMAL LOW (ref 22–32)
Calcium: 8.5 mg/dL — ABNORMAL LOW (ref 8.9–10.3)
Chloride: 96 mmol/L — ABNORMAL LOW (ref 98–111)
Creatinine, Ser: 0.86 mg/dL (ref 0.61–1.24)
GFR, Estimated: >60 mL/min (ref >60)
Glucose, Bld: 113 mg/dL — ABNORMAL HIGH (ref 70–99)
Potassium: 4.5 mmol/L (ref 3.5–5.1)
Sodium: 126 mmol/L — ABNORMAL LOW (ref 135–145)
Total Bilirubin: 1 mg/dL (ref 0.0–1.2)
Total Protein: 6 g/dL — ABNORMAL LOW (ref 6.5–8.1)

## 2024-08-15 LAB — URINALYSIS, ROUTINE W REFLEX MICROSCOPIC
Bilirubin Urine: NEGATIVE
Glucose, UA: NEGATIVE mg/dL
Hgb urine dipstick: NEGATIVE
Ketones, ur: NEGATIVE mg/dL
Leukocytes,Ua: NEGATIVE
Nitrite: NEGATIVE
Protein, ur: NEGATIVE mg/dL
Specific Gravity, Urine: 1.009 (ref 1.005–1.030)
pH: 7 (ref 5.0–8.0)

## 2024-08-15 LAB — I-STAT CHEM 8, ED
BUN: 25 mg/dL — ABNORMAL HIGH (ref 8–23)
Calcium, Ion: 1.12 mmol/L — ABNORMAL LOW (ref 1.15–1.40)
Chloride: 97 mmol/L — ABNORMAL LOW (ref 98–111)
Creatinine, Ser: 0.8 mg/dL (ref 0.61–1.24)
Glucose, Bld: 115 mg/dL — ABNORMAL HIGH (ref 70–99)
HCT: 35 % — ABNORMAL LOW (ref 39.0–52.0)
Hemoglobin: 11.9 g/dL — ABNORMAL LOW (ref 13.0–17.0)
Potassium: 4.4 mmol/L (ref 3.5–5.1)
Sodium: 126 mmol/L — ABNORMAL LOW (ref 135–145)
TCO2: 20 mmol/L — ABNORMAL LOW (ref 22–32)

## 2024-08-15 LAB — PROTIME-INR
INR: 1.3 — ABNORMAL HIGH (ref 0.8–1.2)
Prothrombin Time: 17 s — ABNORMAL HIGH (ref 11.4–15.2)

## 2024-08-15 LAB — CBC
HCT: 34.9 % — ABNORMAL LOW (ref 39.0–52.0)
Hemoglobin: 12 g/dL — ABNORMAL LOW (ref 13.0–17.0)
MCH: 32.9 pg (ref 26.0–34.0)
MCHC: 34.4 g/dL (ref 30.0–36.0)
MCV: 95.6 fL (ref 80.0–100.0)
Platelets: 236 K/uL (ref 150–400)
RBC: 3.65 MIL/uL — ABNORMAL LOW (ref 4.22–5.81)
RDW: 13.2 % (ref 11.5–15.5)
WBC: 13.9 K/uL — ABNORMAL HIGH (ref 4.0–10.5)
nRBC: 0 % (ref 0.0–0.2)

## 2024-08-15 LAB — SAMPLE TO BLOOD BANK

## 2024-08-15 LAB — ETHANOL: Alcohol, Ethyl (B): <15 mg/dL (ref <15)

## 2024-08-15 MED ORDER — SODIUM CHLORIDE 0.9% FLUSH
3.0000 mL | Freq: Two times a day (BID) | INTRAVENOUS | Status: DC
  Administered 2024-08-15: 3 mL via INTRAVENOUS

## 2024-08-15 MED ORDER — SODIUM CHLORIDE 0.9 % IV SOLN: 250.0000 mL | INTRAVENOUS | Status: DC | PRN

## 2024-08-15 MED ORDER — SODIUM CHLORIDE 0.9% FLUSH: 3.0000 mL | INTRAVENOUS | Status: DC | PRN

## 2024-08-15 NOTE — ED Provider Notes (Signed)
 [place] EMERGENCY DEPARTMENT AT [facility] Provider Note   CSN: [id number] Arrival date & time: 08/15/24  1114     Patient presents with: Marcus Martin is a 89 y.o. male.    Fall   Patient has a history of COPD NSTEMI DVT history of PE hypertension hyperlipidemia bilateral hearing loss, neuropathy.  Patient reportedly was found the floor at his nursing facility.  He is on anticoagulation.  He was brought to the ED as a level 2 trauma for possible head injury while on anticoagulants.  Patient himself states he fell the other day.  He is not sure why he fell.  He denies any complaints.  States he is not having any headache or neck pain.  No fevers or chills.  No chest pain no abdominal pain.  No numbness or weakness    Prior to Admission medications   Medication Sig Start Date End Date Taking? Authorizing Provider  acetaminophen  (TYLENOL ) 500 MG tablet Take 1,000 mg by mouth 3 (three) times daily.   Yes [provider]  Aclidinium Bromide  (TUDORZA PRESSAIR ) 400 MCG/ACT AEPB Inhale 1 puff into the lungs in the morning and at bedtime. 01/12/23  Yes Christia Budds, MD  apixaban  (ELIQUIS ) 5 MG TABS tablet Take 1 tablet (5 mg total) by mouth 2 (two) times daily. 01/22/23  Yes Christia Budds, MD  ascorbic acid  (VITAMIN C ) 500 MG tablet Take 500 mg by mouth daily.   Yes [provider]  aspirin  EC 81 MG tablet Take 81 mg by mouth daily.   Yes [provider]  atorvastatin  (LIPITOR) 40 MG tablet Take 0.5 tablets (20 mg total) by mouth daily. 01/22/23  Yes Christia Budds, MD  Brinzolamide -Brimonidine  (SIMBRINZA) 1-0.2 % SUSP Place 1 drop into both eyes 2 (two) times daily.   Yes [provider]  Calcium  Carb-Cholecalciferol  (CALCIUM  500 +D) 500-10 MG-MCG TABS Take 1 tablet by mouth daily.   Yes [provider]  carboxymethylcellulose (REFRESH TEARS) 0.5 % SOLN Place 1 drop into both eyes 4 (four) times daily.   Yes [provider]  cholecalciferol  25 MCG (1000 UT) tablet Take 1,000 Units by mouth daily.   Yes [provider]  docusate sodium  (COLACE) 100 MG capsule Take 100 mg by mouth 2 (two) times daily.   Yes [provider]  famotidine (PEPCID) 20 MG tablet Take 20 mg by mouth daily.   Yes [provider]  ferrous sulfate  325 (65 FE) MG EC tablet Take 325 mg by mouth every Monday, Wednesday, and Friday.   Yes [provider]  fluticasone -salmeterol (WIXELA INHUB) 250-50 MCG/ACT AEPB Inhale 1 puff into the lungs in the morning and at bedtime.   Yes [provider]  furosemide  (LASIX ) 20 MG tablet Take 20 mg by mouth daily as needed (Overload: Weight gain of 3 pounds, 5 pounds, weight gain over a week, shortness of breath and/or swelling).   Yes [provider]  gabapentin  (NEURONTIN ) 300 MG capsule Take 1 capsule (300 mg total) by mouth 2 (two) times daily. 01/12/23  Yes Jagadish, Mayuri, MD  guaiFENesin  (MUCINEX ) 600 MG 12 hr tablet Take 600 mg by mouth 2 (two) times daily as needed for cough or to loosen phlegm.   Yes [provider]  latanoprost  (XALATAN ) 0.005 % ophthalmic solution Place 1 drop into both eyes at bedtime.   Yes [provider]  lisinopril  (ZESTRIL ) 10 MG tablet Take 5 mg by mouth daily. Hold for SBP  less than 100.   Yes [provider]  Lutein 10 MG TABS Take 10 mg by mouth in the morning.   Yes [provider]  mineral oil-hydrophilic petrolatum (AQUAPHOR) ointment Apply 1 Application topically 2 (two) times daily. Buttocks   Yes [provider]  Multiple Vitamin (MULTIVITAMIN) tablet Take 1 tablet by mouth daily. Multivitamin + Folic acid  400 mcg.   Yes [provider]  OVER THE COUNTER MEDICATION Take 2 capsules by mouth at bedtime. Relaxium Sleep   Yes [provider]  polyethylene glycol (MIRALAX  / GLYCOLAX ) 17 g packet Take 17 g by mouth daily.   Yes [provider]  Probiotic Product (RISA-BID PROBIOTIC) TABS Take 1 tablet by mouth in the morning.   Yes [provider]  sodium chloride  1 g tablet Take 1 tablet (1 g total) by mouth 3 (three) times daily with meals. Patient taking differently: Take 2 g by mouth 3 (three) times daily with meals. 11/08/21  Yes Jinwala, Sagar H, MD  tamsulosin  (FLOMAX ) 0.4 MG CAPS capsule Take 2 capsules (0.8 mg total) by mouth at bedtime. 01/22/23  Yes Christia Budds, MD  urea (URE-NA) 15 g PACK oral packet Take 15 g by mouth daily.   Yes [provider]    Allergies: Ticagrelor, Coffea arabica, Simvastatin, Tetanus toxoid-containing vaccines, Spiriva respimat [tiotropium bromide monohydrate], and Niacin    Review of Systems  Updated Vital Signs BP (!) 158/89 (BP Location: Left Arm)   Pulse 81   Temp 97.9 F (36.6 C) (Oral)   Resp 16   Ht 1.803 m (5' 11)   Wt 62.6 kg   SpO2 99%   BMI 19.25 kg/m   Physical Exam Vitals and nursing note reviewed.  Constitutional:      Appearance: He is well-developed. He is not diaphoretic.  HENT:     Head: Normocephalic and atraumatic.     Comments: No laceration noted    Right Ear: External ear normal.     Left Ear: External ear normal.  Eyes:     General: No scleral icterus.       Right eye: No discharge.        Left eye: No discharge.     Conjunctiva/sclera: Conjunctivae normal.  Neck:     Trachea: No tracheal deviation.  Cardiovascular:     Rate and Rhythm: Normal rate and regular rhythm.  Pulmonary:     Effort: Pulmonary effort is normal. No respiratory distress.     Breath sounds: Normal breath sounds. No stridor. No wheezing or rales.  Abdominal:     General: Bowel sounds are normal. There is no distension.     Palpations: Abdomen is soft.     Tenderness: There is no abdominal tenderness. There is no guarding or rebound.  Musculoskeletal:        General: No tenderness or deformity.     Cervical back: Neck supple.     Comments: No  cervical thoracic or lumbar spine tenderness, no tenderness palpation of his upper extremities or lower extremities, no pain with range of motion  Skin:    General: Skin is warm and dry.     Findings: No rash.  Neurological:     General: No focal deficit present.     Mental Status: He is alert.     Cranial Nerves: No cranial nerve deficit, dysarthria or facial asymmetry.     Sensory: No sensory deficit.     Motor: No abnormal muscle tone or  seizure activity.     Coordination: Coordination normal.     Comments: Equal grip strength bilaterally patient able to lift both legs off the bed without difficulty, no facial droop  Psychiatric:        Mood and Affect: Mood normal.     (all labs ordered are listed, but only abnormal results are displayed) Labs Reviewed  COMPREHENSIVE METABOLIC PANEL WITH GFR - Abnormal; Notable for the following components:      Result Value   Sodium 126 (*)    Chloride 96 (*)    CO2 19 (*)    Glucose, Bld 113 (*)    BUN 26 (*)    Calcium  8.5 (*)    Total Protein 6.0 (*)    Albumin 3.2 (*)    All other components within normal limits  CBC - Abnormal; Notable for the following components:   WBC 13.9 (*)    RBC 3.65 (*)    Hemoglobin 12.0 (*)    HCT 34.9 (*)    All other components within normal limits  PROTIME-INR - Abnormal; Notable for the following components:   Prothrombin Time 17.0 (*)    INR 1.3 (*)    All other components within normal limits  I-STAT CHEM 8, ED - Abnormal; Notable for the following components:   Sodium 126 (*)    Chloride 97 (*)    BUN 25 (*)    Glucose, Bld 115 (*)    Calcium , Ion 1.12 (*)    TCO2 20 (*)    Hemoglobin 11.9 (*)    HCT 35.0 (*)    All other components within normal limits  ETHANOL  URINALYSIS, ROUTINE W REFLEX MICROSCOPIC  SAMPLE TO BLOOD BANK    EKG: EKG Interpretation Date/Time:  Friday August 15 2024 11:54:10 EDT Ventricular Rate:  75 PR Interval:  278 QRS Duration:  94 QT  Interval:  365 QTC Calculation: 408 R Axis:   101  Text Interpretation: Right and left arm electrode reversal, interpretation assumes no reversal Sinus rhythm Prolonged PR interval No significant change since last tracing Confirmed by Randol Simmonds [phone]) on 08/15/2024 1:56:30 PM  Radiology: CT CERVICAL SPINE WO CONTRAST Result Date: 08/15/2024 EXAM: CT CERVICAL SPINE WITHOUT CONTRAST 08/15/2024 11:51:00 AM TECHNIQUE: CT of the cervical spine was performed without the administration of intravenous contrast. Multiplanar reformatted images are provided for review. Automated exposure control, iterative reconstruction, and/or weight based adjustment of the mA/kV was utilized to reduce the radiation dose to as low as reasonably achievable. COMPARISON: CT cervical spine 08/27/2023. CLINICAL HISTORY: Polytrauma, blunt. Fall. Confusion. 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 Postsurgical changes of the C1 posterior arch. Comminuted type II dens fracture again noted with no osseous fusion at the fracture site. Similar degree of displacement of the dens. Anterior cervical fusion hardware at C5-6. Hardware is intact. Diffuse osteopenia. Chronic deformities of the T1 and T2 vertebral bodies again noted. DEGENERATIVE CHANGES: Moderate-to-severe adjacent segment disc space narrowing at C4-5 and C6-7. Disc bulge at C3-4 resulting in mild spinal canal stenosis. Disc osteophyte complex and thickening of the ligaments and flavum at C4-5 resulting in mild-to-moderate spinal canal stenosis. Facet arthrosis and uncovertebral hypertrophy at multiple levels. Significant foraminal narrowing noted at C3-4. SOFT TISSUES: Atherosclerosis at the carotid bifurcations. Bullous changes in the right lung apex. Centrilobular emphysema noted. IMPRESSION: 1. Comminuted type II dens fracture again noted with no osseous fusion at the  fracture site  and similar degree of displacement of the dens. 2. 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 3. No acute fracture. 4. Anterior cervical fusion hardware at C5-6 is intact. 5. Degenerative changes as above. Electronically signed by: Donnice Mania MD 08/15/2024 12:19 PM EDT RP Workstation: [id number]   DG Chest Port 1 View Result Date: 08/15/2024 CLINICAL DATA:  Fall. EXAM: PORTABLE CHEST 1 VIEW COMPARISON:  08/26/2023. FINDINGS: The heart size and mediastinal contours are within normal limits. Aortic atherosclerosis. Linear subsegmental atelectasis/scarring in the left mid lung and the bilateral lung bases. The previously noted right upper lobe cavitary lesion is not clearly visualized on today's exam. No confluence focal consolidation, pleural effusion, or pneumothorax. Remote right posterior fifth and sixth rib fractures. No acute osseous abnormality identified. IMPRESSION: 1. No acute findings in the chest. 2. Linear subsegmental atelectasis/scarring in the left mid lung and the bilateral lung bases. 3. The previously noted right upper lobe cavitary lesion is not clearly visualized on today's exam. Electronically Signed   By: Harrietta Sherry M.D.   On: 08/15/2024 12:10   CT HEAD WO CONTRAST Result Date: 08/15/2024 EXAM: CT HEAD WITHOUT CONTRAST 08/15/2024 11:51:00 AM TECHNIQUE: CT of the head was performed without the administration of intravenous contrast. Automated exposure control, iterative reconstruction, and/or weight based adjustment of the mA/kV was utilized to reduce the radiation dose to as low as reasonably achievable. COMPARISON: 08/27/2023 CLINICAL HISTORY: Head trauma, moderate-severe. Fall. Confusion. FINDINGS: BRAIN AND VENTRICLES: Nonspecific hypoattenuation in the periventricular and subcortical white matter, most likely representing chronic microvascular ischemic changes. Remote lacunar infarcts in the lateral basal ganglia. Mild parenchymal  volume loss. Postsurgical changes of suboccipital craniectomy and resection of the C1 posterior arch decompression of the posterior fossa. ORBITS: Bilateral lens replacement. SINUSES: Polypoid soft tissue within the anterior superior aspect of the right nasal cavity. SOFT TISSUES AND SKULL: Soft tissue swelling in the left parietooccipital scalp. No skull fracture. VASCULATURE: Atherosclerosis of the carotid siphons and intracranial vertebral arteries. IMPRESSION: 1. No acute intracranial abnormality. 2. Soft tissue swelling in the left parietooccipital scalp. Electronically signed by: Donnice Mania MD 08/15/2024 12:10 PM EDT RP Workstation: [id number]   DG Pelvis Portable Result Date: 08/15/2024 CLINICAL DATA:  Fall. EXAM: PORTABLE PELVIS 1-2 VIEWS COMPARISON:  08/26/2023. FINDINGS: There is no evidence of acute fracture or diastasis. Femoral heads are seated within the acetabula. Mild osteoarthritis of the bilateral hips. Sacroiliac joints and pubic symphysis appear anatomically aligned. IMPRESSION: No acute osseous abnormality. Electronically Signed   By: Harrietta Sherry M.D.   On: 08/15/2024 12:04     Procedures   Medications Ordered in the ED  sodium chloride  flush (NS) 0.9 % injection 3 mL (3 mLs Intravenous Given 08/15/24 1200)  sodium chloride  flush (NS) 0.9 % injection 3 mL (has no administration in time range)  0.9 %  sodium chloride  infusion (has no administration in time range)    Clinical Course as of 08/15/24 1356  Fri Aug 15, 2024  1205 I-Stat Chem 8, ED(!) Sodium level decreased, similar to previous values [JK]  1205 CBC(!) Hemoglobin increased compared to previous [JK]  1235 Chronic c1 c2 fx again noted.   [JK]  1237 CT without acute intracranial changes [JK]  1237 Chest and pelvis without acute findings [JK]  1347 Urinalysis, Routine w reflex microscopic -Urine, Clean Catch nl [JK]    Clinical Course User Index [JK] Randol Simmonds, MD  Medical Decision Making Problems Addressed: Fall, initial encounter: acute illness or injury that poses a threat to life or bodily functions Hyponatremia: chronic illness or injury  Amount and/or Complexity of Data Reviewed Labs: ordered. Decision-making details documented in ED Course. Radiology: ordered and independent interpretation performed.  Risk Prescription drug management.  Patient presented to the ED for evaluation after a fall.  Patient is a resident of a nursing facility.  He does have some underlying memory issues.  ED workup reassuring.  No signs of anemia.  No findings to suggest infection.  Patient does have hyponatremia but this is chronic and unchanged.  Imaging test do not show any signs of fracture.  No evidence of pneumonia.  Head CT does not show any signs of cerebral hemorrhage.  Cervical spine CT does show a chronic C1-C2 fracture that has not completely healed.  Patient was able to stand and walk here in the ED.  He states he is feeling fine and would like to go back to the nursing facility  Evaluation and diagnostic testing in the emergency department does not suggest an emergent condition requiring admission or immediate intervention beyond what has been performed at this time.  The patient is safe for discharge and has been instructed to return immediately for worsening symptoms, change in symptoms or any other concerns.     Final diagnoses:  Fall, initial encounter  Hyponatremia    ED Discharge Orders     None          Randol Simmonds, MD 08/15/24 1356

## 2024-08-15 NOTE — ED Notes (Signed)
[facility] called  they will be here within an hour

## 2024-08-15 NOTE — ED Triage Notes (Signed)
 BIB [facility] from [facility], confusion at baseline intermittently. Found in bathroom on floor hematoma on back of head

## 2024-08-15 NOTE — ED Notes (Signed)
 Pt out of bed and urinated on floor without calling for assistance. Pt back on stretcher and posey alarm placed on it.

## 2024-08-15 NOTE — Discharge Instructions (Addendum)
 The test today did not show any signs of serious injury.  You have an old cervical spine injury that has not completely healed.  This is unchanged from previous CAT scans you have had.  Your sodium levels also low but this is also chronically low for you.  Follow-up with your doctor to be rechecked.  Return if you start having any weakness fever vomiting or other concerns

## 2024-08-18 DIAGNOSIS — R296 Repeated falls: Secondary | ICD-10-CM | POA: Diagnosis not present

## 2024-08-27 DIAGNOSIS — E222 Syndrome of inappropriate secretion of antidiuretic hormone: Secondary | ICD-10-CM | POA: Diagnosis not present

## 2024-08-28 DIAGNOSIS — I4891 Unspecified atrial fibrillation: Secondary | ICD-10-CM | POA: Diagnosis not present

## 2024-08-28 DIAGNOSIS — E785 Hyperlipidemia, unspecified: Secondary | ICD-10-CM | POA: Diagnosis not present

## 2024-08-28 DIAGNOSIS — I509 Heart failure, unspecified: Secondary | ICD-10-CM | POA: Diagnosis not present

## 2024-08-28 DIAGNOSIS — I10 Essential (primary) hypertension: Secondary | ICD-10-CM | POA: Diagnosis not present

## 2024-08-28 DIAGNOSIS — N189 Chronic kidney disease, unspecified: Secondary | ICD-10-CM | POA: Diagnosis not present

## 2024-08-28 DIAGNOSIS — I70223 Atherosclerosis of native arteries of extremities with rest pain, bilateral legs: Secondary | ICD-10-CM | POA: Diagnosis not present

## 2024-08-28 DIAGNOSIS — J449 Chronic obstructive pulmonary disease, unspecified: Secondary | ICD-10-CM | POA: Diagnosis not present

## 2024-08-29 DIAGNOSIS — E871 Hypo-osmolality and hyponatremia: Secondary | ICD-10-CM | POA: Diagnosis not present

## 2024-08-29 DIAGNOSIS — N189 Chronic kidney disease, unspecified: Secondary | ICD-10-CM | POA: Diagnosis not present

## 2024-09-01 DIAGNOSIS — E222 Syndrome of inappropriate secretion of antidiuretic hormone: Secondary | ICD-10-CM | POA: Diagnosis not present

## 2024-09-01 DIAGNOSIS — R296 Repeated falls: Secondary | ICD-10-CM | POA: Diagnosis not present

## 2024-09-01 DIAGNOSIS — E871 Hypo-osmolality and hyponatremia: Secondary | ICD-10-CM | POA: Diagnosis not present

## 2024-09-04 DIAGNOSIS — D649 Anemia, unspecified: Secondary | ICD-10-CM | POA: Diagnosis not present

## 2024-09-04 DIAGNOSIS — I10 Essential (primary) hypertension: Secondary | ICD-10-CM | POA: Diagnosis not present

## 2024-09-09 DIAGNOSIS — I10 Essential (primary) hypertension: Secondary | ICD-10-CM | POA: Diagnosis not present

## 2024-09-15 DIAGNOSIS — I4891 Unspecified atrial fibrillation: Secondary | ICD-10-CM | POA: Diagnosis not present

## 2024-09-15 DIAGNOSIS — E222 Syndrome of inappropriate secretion of antidiuretic hormone: Secondary | ICD-10-CM | POA: Diagnosis not present

## 2024-09-15 DIAGNOSIS — I10 Essential (primary) hypertension: Secondary | ICD-10-CM | POA: Diagnosis not present

## 2024-09-22 DIAGNOSIS — I10 Essential (primary) hypertension: Secondary | ICD-10-CM | POA: Diagnosis not present

## 2024-11-10 NOTE — Discharge Summary (Signed)
 [facility]ITAL[facility] Discharge Summary  Marcus Martin FMW:[id number] DOB: 11-Oct-1933  PCP: SUZANNE  BURKS-BERMUDEZ, MD  Admit date: 11/03/2024 Discharge date: 11/10/2024  Time spent: 25 minutes  Recommendations for Outpatient Follow-up:  Patient returning back to his assisted living facility with home health physical therapy New medication: Albuterol  MDI 2 puffs 4 times a day as needed for shortness of breath New medication: Prednisone  taper Medication change: Patient recently started on Levaquin prior to admission.  This medication has been discontinued   Discharge Condition: Improved, being discharged back to assisted living  Diet recommendation: Mechanical soft  Vitals:   11/10/24 1357  BP:   Pulse: 77  Resp: 16  Temp:   SpO2: 96%    History of present illness:  89 year old male with past medical history of COPD on as needed oxygen plus hypertension, CAD and peripheral neuropathy who presented to the emergency room from his assisted living facility with hypoxia and shortness of breath.  Patient found to be at 84% despite being on 2 L.  Evaluation revealed COPD exacerbation and he was admitted to the hospitalist service.  Hospital Course:  Principal Problem:   COPD with acute exacerbation (*) causing acute on chronic respiratory failure with hypoxia: Patient treated with doxycycline, prednisone , nebulizers.  By 12/8, able to be almost fully weaned off of oxygen at rest.  Is discharged on albuterol  inhaler plus prednisone  taper.  Completed course of antibiotics prior to discharge. Active Problems:    Hypertensive urgency as oxygenation improved, blood pressure resolved   Dyslipidemia   History of peripheral neuropathy: Continue Neurontin .    Benign prostatic hyperplasia without lower urinary tract symptoms: Continue Flomax .    History of glaucoma: Continue patient's eyedrops.    History of DVT (deep vein thrombosis): Continue Eliquis   Underweight: Patient's BMI is  18.33.  Debility: Evaluated by PT and OT who recommended skilled nursing however this was not approved by patient's insurance.  He will be discharged back to assisted living with physical therapy from home health.   Procedures: None  Consultations: None  Discharge Exam: BP (!) 96/45 (BP Location: Right Upper Arm, Patient Position: Sitting)   Pulse 77   Temp 97.5 F (36.4 C) (Oral)   Resp 16   Ht 1.803 m (5' 11)   Wt 59.6 kg (131 lb 6.3 oz)   SpO2 96%   BMI 18.33 kg/m   General: Alert and oriented x 2, no acute distress Cardiovascular: Regular rate and rhythm, S1-S2 Respiratory: Decreased breath sounds throughout  Discharge Instructions You were cared for by a hospitalist during your hospital stay. If you have any questions about your discharge medications or the care you received while you were in the hospital after you are discharged, you can call the unit and asked to speak with the hospitalist on call if the hospitalist that took care of you is not available. Once you are discharged, your primary care physician will handle any further medical issues. Please note that NO REFILLS for any discharge medications will be authorized once you are discharged, as it is imperative that you return to your primary care physician (or establish a relationship with a primary care physician if you do not have one) for your aftercare needs so that they can reassess your need for medications and monitor your lab values.     Medication List     START taking these medications    albuterol  sulfate HFA 108 (90 Base) MCG/ACT inhaler Commonly known as: PROVENTIL ,VENTOLIN ,PROAIR  Inhale two puffs into  the lungs every 6 (six) hours as needed for Wheezing.   predniSONE  10 mg tablet Commonly known as: DELTASONE  Take three tablets (30 mg dose) by mouth daily for 2 days, THEN two tablets (20 mg dose) daily for 2 days, THEN one tablet (10 mg dose) daily for 2 days. Start taking on: November 11, 2024        CONTINUE taking these medications    acetaminophen  500 mg tablet Commonly known as: TYLENOL    aclidinium bromide  400 mcg/act inhalation powder Commonly known as: TUDORZA-PRESSAIR   AQUAPHOR ADV PROTECT HEALING 41 % Oint   ascorbic acid  tablet Commonly known as: VITAMIN C    aspirin  EC EC tablet Generic drug: aspirin    atorvastatin  20 mg tablet Commonly known as: LIPITOR   brinzolamide -brimonidine  1-0.2 % ophthalmic solution Commonly known as: SIMBRINZA   calcium  carbonate-cholecalciferol  500-10 mg-mcg   carboxymethylcellulose (PF) 1% ophthalmic solution Commonly known as: CELLUVISC   cholecalciferol  1,000 units (25 mcg) tablet Commonly known as: VITAMIN D -3   docusate sodium  capsule Commonly known as: COLACE,DOK,DOCQLACE   ELIQUIS  5 mg tablet Generic drug: apixaban    famotidine 20 mg tablet Commonly known as: PEPCID   ferrous gluconate 324 mg tablet Commonly known as: FERGON   ferrous sulfate  325 (65 FE) MG tablet   FLOMAX  0.4 mg Caps Generic drug: tamsulosin    gabapentin  300 mg capsule Commonly known as: NEURONTIN    guaiFENesin  600 mg 12 hr tablet Commonly known as: MUCINEX    HIGH POTENCY MULTIVITAMIN Tabs   latanoprost  0.005% ophthalmic solution Commonly known as: XALATAN    loperamide 2 MG tablet Commonly known as: ANTI-DIARRHEAL,IMODIUM A-D   Lutein 10 MG Tabs   melatonin 5 mg   OXYGEN   polyethylene glycol 17 GM/SCOOP powder Commonly known as: MIRALAX ,GAVILAX,CLEARLAX   PREPARATION H 0.25-14-74.9 % rectal ointment Generic drug: phenylephrine-mineral oil-petrolatum   psyllium Pack packet Commonly known as: METAMUCIL   RISA-BID PROBIOTIC Tabs   sennosides-docusate sodium  8.6-50 mg per tablet Commonly known as: SENOKOT-S   sodium chloride  1 g tablet   UNABLE TO FIND   URE-NA 15 g Pack Generic drug: urea   WIXELA INHUB 250-50 mcg/dose Aepb inhalation powder Generic drug: fluticasone -salmeterol       STOP taking  these medications    levofloxacin 750 mg tablet Commonly known as: LEVAQUIN         Where to Get Your Medications     These medications were sent to [facility] [place], [place] - [address] [facility]  [address] [place] [address]    Phone: [phone]  albuterol  sulfate HFA 108 (90 Base) MCG/ACT inhaler predniSONE  10 mg tablet    Allergies[1]   The results of significant diagnostics from this hospitalization (including imaging, microbiology, ancillary and laboratory) are listed below for reference.    Significant Diagnostic Studies: Echocardiogram Complete WO Enhancing Agent Result Date: 11/03/2024 .  Left Ventricle: Systolic function is normal. EF: 65-70%. Quantitative analysis of left ventricular Global Longitudinal Strain (GLS) imaging is -19.500%, consistent with normal strain. Ejection fraction measured by 3D is 61%, which is normal. Doppler parameters consistent with mild diastolic dysfunction and low to normal LA pressure. .  Tricuspid Valve: There is mild to moderate regurgitation.   CT Angio Chest Pulmonary Result Date: 11/03/2024 COMPARISON: 03/15/2020 INDICATION: Shortness of breath TECHNIQUE: CTA CHEST 80 mL  Isovue-370 Dose reduction was utilized (automated exposure control, mA or kV adjustment based on patient size, or iterative image reconstruction). Image post processing was then performed  creating multi planar 2D and angiographic 3D MIP, shaded surface rendering or 3D volume rendering images for review and comparison. FINDINGS: CTA CHEST Exam Limitations: Motion artifact. Lower Neck: Lower cervical spine plate with screws. Pulmonary Arteries: No pulmonary emboli identified.  Satisfactory opacification of pulmonary arteries. Thoracic Aorta: Negative for dissection.  Negative for aneurysm. Cardiovascular: Normal heart size.  Prominent coronary artery calcifications. Lymph Nodes: Mild bilateral hilar  lymphadenopathy, likely reactive. Pleura: Small left pleural effusion. Negative for pneumothorax.  Chronic left major fissure pleural thickening. Mediastinum: Unremarkable Lungs: Right lower lobe bronchial mucous plugging.  No acute pulmonary infiltrate.  Mild emphysematous changes.  Scattered parenchymal scarring. Chest Walls: Unremarkable Bones: Multilevel spondylosis.  Old bilateral rib fractures. Upper Abdomen: Mildly distended gallbladder with gallstones.  Small left renal cyst.  Small hiatal hernia.    IMPRESSION: CTA CHEST 1.  No acute pulmonary embolus identified. 2.  Right lower lobe bronchial mucous plugging,, which may be secondary to aspiration, without acute pulmonary infiltrate.  Consider pulmonary toilet. 3.  Small left pleural effusion. 4.  Cholelithiasis.  Correlate with right upper quadrant tenderness. 5.  Mild emphysema. 6.  Prominent coronary artery calcification. Electronically Signed by: Charlie Patch, MD on 11/03/2024 5:10 AM  XR Chest Ap Portable Result Date: 11/03/2024 COMPARISON:05/19/2023 TECHNIQUE:  XR CHEST AP PORTABLE INDICATION: Cough FINDINGS: XR CHEST Unremarkable cardiomediastinal silhouette.  Questionable right perihilar infiltrate. Diffuse coarse pulmonary markings/COPD. No pneumothorax identified. Lower cervical spine plate with screws.   IMPRESSION: XR CHEST Questionable right perihilar infiltrate. COPD. Electronically Signed by: Charlie Patch, MD on 11/03/2024 3:11 AM   Microbiology: @[email or web address] @   Labs: Basic Metabolic Panel: Recent Labs    Units 11/10/24 1127 11/10/24 0828 11/09/24 1950 11/09/24 1653 11/09/24 1138 11/07/24 9177 11/07/24 0254 11/06/24 0532 11/06/24 0141 11/05/24 0104  NA mmol/L  --   --   --   --   --   --  132*  --  134* 135*  K mmol/L  --   --   --   --   --   --  3.8  --  3.9 4.1  CL mmol/L  --   --   --   --   --   --  102  --  102 104  CO2 mmol/L  --   --   --   --   --   --  21  --  21 21  GLUCOSE mg/dL 866* 93 803* 794*  869*   < > 86   < > 102* 120*  BUN mg/dL  --   --   --   --   --   --  22  --  26 36*  CREATININE mg/dL  --   --   --   --   --   --  0.59*  --  0.58* 0.63*  CALCIUM  mg/dL  --   --   --   --   --   --  8.5*  --  8.4* 8.5*   < > = values in this interval not displayed.   Liver Function Tests: No results for input(s): AST, ALT, ALKPHOS, BILITOT, PROT, ALBUMIN in the last 168 hours. No results for input(s): LIPASE, AMYLASE in the last 168 hours. No results for input(s): AMMONIA in the last 168 hours. CBC: Recent Labs    Units 11/07/24 0254 11/06/24 0141 11/05/24 0104  WBC thou/mcL 9.9 8.2 9.6  HGB gm/dL 87.9* 88.7* 88.8*  HCT % 35.2* 33.0* 33.5*  MCV  fL 92.6* 93.5* 92.8*  PLT thou/mcL 352 314 300   Cardiac Enzymes: No results for input(s): CKMB, TROPONINI in the last 168 hours.  Invalid input(s): CKTOTAL, CKMBINDEX BNP: NT-ProBNP  Date Value Ref Range Status  11/03/2024 881 <=1,799 pg/mL Final    Comment:    Among patients presenting with dyspnea, NT-proBNP is highly sensitive for the detection of acute decompensated heart failure (ADHF). In addition, a NT-proBNP when used with the recommended age-independent exclusionary cut-point of <300 pg/mL effectively rules out ADHF with a negative predictive value (NPV) ranging from 97.7% for males to 98.5% for females.    CBG: Invalid input(s): GLUCAP     Signed:  Sendil Krishnan, MD 11/10/2024, 2:06 PM        [1] Allergies Allergen Reactions  . Niacin Er Other    flushing  . Tetanus Toxoids Unknown    Horse serum only
